# Patient Record
Sex: Female | Born: 1935
Health system: Southern US, Community
[De-identification: ages and names within clinical notes are randomized; demographics above are authoritative.]

## PROBLEM LIST (undated history)

## (undated) DIAGNOSIS — K219 Gastro-esophageal reflux disease without esophagitis: Secondary | ICD-10-CM

## (undated) DIAGNOSIS — I10 Essential (primary) hypertension: Secondary | ICD-10-CM

## (undated) DIAGNOSIS — R1310 Dysphagia, unspecified: Secondary | ICD-10-CM

## (undated) DIAGNOSIS — E785 Hyperlipidemia, unspecified: Secondary | ICD-10-CM

## (undated) DIAGNOSIS — J449 Chronic obstructive pulmonary disease, unspecified: Secondary | ICD-10-CM

## (undated) DIAGNOSIS — E119 Type 2 diabetes mellitus without complications: Secondary | ICD-10-CM

## (undated) HISTORY — DX: Hyperlipidemia, unspecified: E78.5

## (undated) HISTORY — DX: Type 2 diabetes mellitus without complications: E11.9

## (undated) HISTORY — DX: Essential (primary) hypertension: I10

## (undated) HISTORY — PX: HIP SURGERY: SHX245

## (undated) HISTORY — PX: ABDOMINAL HYSTERECTOMY: SHX81

## (undated) HISTORY — PX: CHOLECYSTECTOMY: SHX55

---

## 2015-06-28 DIAGNOSIS — E1140 Type 2 diabetes mellitus with diabetic neuropathy, unspecified: Secondary | ICD-10-CM | POA: Diagnosis not present

## 2015-06-28 DIAGNOSIS — R2689 Other abnormalities of gait and mobility: Secondary | ICD-10-CM | POA: Diagnosis not present

## 2015-06-28 DIAGNOSIS — L84 Corns and callosities: Secondary | ICD-10-CM | POA: Diagnosis not present

## 2015-06-28 DIAGNOSIS — E0842 Diabetes mellitus due to underlying condition with diabetic polyneuropathy: Secondary | ICD-10-CM | POA: Diagnosis not present

## 2015-06-28 DIAGNOSIS — M79671 Pain in right foot: Secondary | ICD-10-CM | POA: Diagnosis not present

## 2015-06-28 DIAGNOSIS — B351 Tinea unguium: Secondary | ICD-10-CM | POA: Diagnosis not present

## 2015-06-28 DIAGNOSIS — M76821 Posterior tibial tendinitis, right leg: Secondary | ICD-10-CM | POA: Diagnosis not present

## 2015-08-29 DIAGNOSIS — E119 Type 2 diabetes mellitus without complications: Secondary | ICD-10-CM | POA: Diagnosis not present

## 2015-08-29 DIAGNOSIS — E785 Hyperlipidemia, unspecified: Secondary | ICD-10-CM | POA: Diagnosis not present

## 2015-08-29 DIAGNOSIS — I10 Essential (primary) hypertension: Secondary | ICD-10-CM | POA: Diagnosis not present

## 2015-09-05 DIAGNOSIS — M810 Age-related osteoporosis without current pathological fracture: Secondary | ICD-10-CM | POA: Diagnosis not present

## 2015-09-05 DIAGNOSIS — E559 Vitamin D deficiency, unspecified: Secondary | ICD-10-CM | POA: Diagnosis not present

## 2015-09-05 DIAGNOSIS — E119 Type 2 diabetes mellitus without complications: Secondary | ICD-10-CM | POA: Diagnosis not present

## 2015-09-06 DIAGNOSIS — E119 Type 2 diabetes mellitus without complications: Secondary | ICD-10-CM | POA: Diagnosis not present

## 2015-09-06 DIAGNOSIS — I10 Essential (primary) hypertension: Secondary | ICD-10-CM | POA: Diagnosis not present

## 2015-09-06 DIAGNOSIS — E7800 Pure hypercholesterolemia, unspecified: Secondary | ICD-10-CM | POA: Diagnosis not present

## 2015-09-06 DIAGNOSIS — Z23 Encounter for immunization: Secondary | ICD-10-CM | POA: Diagnosis not present

## 2015-09-06 DIAGNOSIS — M810 Age-related osteoporosis without current pathological fracture: Secondary | ICD-10-CM | POA: Diagnosis not present

## 2015-09-13 DIAGNOSIS — E0842 Diabetes mellitus due to underlying condition with diabetic polyneuropathy: Secondary | ICD-10-CM | POA: Diagnosis not present

## 2015-09-13 DIAGNOSIS — B351 Tinea unguium: Secondary | ICD-10-CM | POA: Diagnosis not present

## 2015-09-13 DIAGNOSIS — E1140 Type 2 diabetes mellitus with diabetic neuropathy, unspecified: Secondary | ICD-10-CM | POA: Diagnosis not present

## 2015-09-13 DIAGNOSIS — L84 Corns and callosities: Secondary | ICD-10-CM | POA: Diagnosis not present

## 2015-11-15 DIAGNOSIS — B351 Tinea unguium: Secondary | ICD-10-CM | POA: Diagnosis not present

## 2015-11-15 DIAGNOSIS — E0842 Diabetes mellitus due to underlying condition with diabetic polyneuropathy: Secondary | ICD-10-CM | POA: Diagnosis not present

## 2015-11-15 DIAGNOSIS — L84 Corns and callosities: Secondary | ICD-10-CM | POA: Diagnosis not present

## 2015-11-15 DIAGNOSIS — E1140 Type 2 diabetes mellitus with diabetic neuropathy, unspecified: Secondary | ICD-10-CM | POA: Diagnosis not present

## 2015-11-23 DIAGNOSIS — E113293 Type 2 diabetes mellitus with mild nonproliferative diabetic retinopathy without macular edema, bilateral: Secondary | ICD-10-CM | POA: Diagnosis not present

## 2015-11-23 DIAGNOSIS — H2640 Unspecified secondary cataract: Secondary | ICD-10-CM | POA: Diagnosis not present

## 2015-11-23 DIAGNOSIS — H35372 Puckering of macula, left eye: Secondary | ICD-10-CM | POA: Diagnosis not present

## 2015-11-23 DIAGNOSIS — I119 Hypertensive heart disease without heart failure: Secondary | ICD-10-CM | POA: Diagnosis not present

## 2016-01-31 DIAGNOSIS — M2011 Hallux valgus (acquired), right foot: Secondary | ICD-10-CM | POA: Diagnosis not present

## 2016-01-31 DIAGNOSIS — M2012 Hallux valgus (acquired), left foot: Secondary | ICD-10-CM | POA: Diagnosis not present

## 2016-01-31 DIAGNOSIS — B351 Tinea unguium: Secondary | ICD-10-CM | POA: Diagnosis not present

## 2016-01-31 DIAGNOSIS — E0842 Diabetes mellitus due to underlying condition with diabetic polyneuropathy: Secondary | ICD-10-CM | POA: Diagnosis not present

## 2016-01-31 DIAGNOSIS — L84 Corns and callosities: Secondary | ICD-10-CM | POA: Diagnosis not present

## 2016-01-31 DIAGNOSIS — E1140 Type 2 diabetes mellitus with diabetic neuropathy, unspecified: Secondary | ICD-10-CM | POA: Diagnosis not present

## 2016-03-07 DIAGNOSIS — E119 Type 2 diabetes mellitus without complications: Secondary | ICD-10-CM | POA: Diagnosis not present

## 2016-03-07 DIAGNOSIS — S0011XA Contusion of right eyelid and periocular area, initial encounter: Secondary | ICD-10-CM | POA: Diagnosis not present

## 2016-03-07 DIAGNOSIS — S40021A Contusion of right upper arm, initial encounter: Secondary | ICD-10-CM | POA: Diagnosis not present

## 2016-03-07 DIAGNOSIS — Z9181 History of falling: Secondary | ICD-10-CM | POA: Diagnosis not present

## 2016-03-07 DIAGNOSIS — Y998 Other external cause status: Secondary | ICD-10-CM | POA: Diagnosis not present

## 2016-03-07 DIAGNOSIS — J45909 Unspecified asthma, uncomplicated: Secondary | ICD-10-CM | POA: Diagnosis present

## 2016-03-07 DIAGNOSIS — Z01818 Encounter for other preprocedural examination: Secondary | ICD-10-CM | POA: Diagnosis not present

## 2016-03-07 DIAGNOSIS — M6250 Muscle wasting and atrophy, not elsewhere classified, unspecified site: Secondary | ICD-10-CM | POA: Diagnosis not present

## 2016-03-07 DIAGNOSIS — W102XXA Fall (on)(from) incline, initial encounter: Secondary | ICD-10-CM | POA: Diagnosis not present

## 2016-03-07 DIAGNOSIS — S72001D Fracture of unspecified part of neck of right femur, subsequent encounter for closed fracture with routine healing: Secondary | ICD-10-CM | POA: Diagnosis not present

## 2016-03-07 DIAGNOSIS — S51811A Laceration without foreign body of right forearm, initial encounter: Secondary | ICD-10-CM | POA: Diagnosis not present

## 2016-03-07 DIAGNOSIS — E559 Vitamin D deficiency, unspecified: Secondary | ICD-10-CM | POA: Diagnosis not present

## 2016-03-07 DIAGNOSIS — R2689 Other abnormalities of gait and mobility: Secondary | ICD-10-CM | POA: Diagnosis not present

## 2016-03-07 DIAGNOSIS — W01198A Fall on same level from slipping, tripping and stumbling with subsequent striking against other object, initial encounter: Secondary | ICD-10-CM | POA: Diagnosis not present

## 2016-03-07 DIAGNOSIS — J4520 Mild intermittent asthma, uncomplicated: Secondary | ICD-10-CM | POA: Diagnosis not present

## 2016-03-07 DIAGNOSIS — Z96641 Presence of right artificial hip joint: Secondary | ICD-10-CM | POA: Diagnosis not present

## 2016-03-07 DIAGNOSIS — M25551 Pain in right hip: Secondary | ICD-10-CM | POA: Diagnosis not present

## 2016-03-07 DIAGNOSIS — Z7984 Long term (current) use of oral hypoglycemic drugs: Secondary | ICD-10-CM | POA: Diagnosis not present

## 2016-03-07 DIAGNOSIS — S72001A Fracture of unspecified part of neck of right femur, initial encounter for closed fracture: Secondary | ICD-10-CM | POA: Diagnosis not present

## 2016-03-07 DIAGNOSIS — M810 Age-related osteoporosis without current pathological fracture: Secondary | ICD-10-CM | POA: Diagnosis not present

## 2016-03-07 DIAGNOSIS — S72091A Other fracture of head and neck of right femur, initial encounter for closed fracture: Secondary | ICD-10-CM | POA: Diagnosis not present

## 2016-03-07 DIAGNOSIS — Y92531 Health care provider office as the place of occurrence of the external cause: Secondary | ICD-10-CM | POA: Diagnosis not present

## 2016-03-07 DIAGNOSIS — J449 Chronic obstructive pulmonary disease, unspecified: Secondary | ICD-10-CM | POA: Diagnosis not present

## 2016-03-07 DIAGNOSIS — Z1383 Encounter for screening for respiratory disorder NEC: Secondary | ICD-10-CM | POA: Diagnosis not present

## 2016-03-07 DIAGNOSIS — S72041A Displaced fracture of base of neck of right femur, initial encounter for closed fracture: Secondary | ICD-10-CM | POA: Diagnosis not present

## 2016-03-07 DIAGNOSIS — E785 Hyperlipidemia, unspecified: Secondary | ICD-10-CM | POA: Diagnosis present

## 2016-03-07 DIAGNOSIS — S72141A Displaced intertrochanteric fracture of right femur, initial encounter for closed fracture: Secondary | ICD-10-CM | POA: Diagnosis not present

## 2016-03-07 DIAGNOSIS — Z85038 Personal history of other malignant neoplasm of large intestine: Secondary | ICD-10-CM | POA: Diagnosis not present

## 2016-03-07 DIAGNOSIS — S0511XA Contusion of eyeball and orbital tissues, right eye, initial encounter: Secondary | ICD-10-CM | POA: Diagnosis not present

## 2016-03-07 DIAGNOSIS — S50311D Abrasion of right elbow, subsequent encounter: Secondary | ICD-10-CM | POA: Diagnosis not present

## 2016-03-07 DIAGNOSIS — Z87891 Personal history of nicotine dependence: Secondary | ICD-10-CM | POA: Diagnosis not present

## 2016-03-07 DIAGNOSIS — Y9389 Activity, other specified: Secondary | ICD-10-CM | POA: Diagnosis not present

## 2016-03-07 DIAGNOSIS — Z01810 Encounter for preprocedural cardiovascular examination: Secondary | ICD-10-CM | POA: Diagnosis not present

## 2016-03-07 DIAGNOSIS — Z5189 Encounter for other specified aftercare: Secondary | ICD-10-CM | POA: Diagnosis not present

## 2016-03-07 DIAGNOSIS — D62 Acute posthemorrhagic anemia: Secondary | ICD-10-CM | POA: Diagnosis not present

## 2016-03-07 DIAGNOSIS — S0081XA Abrasion of other part of head, initial encounter: Secondary | ICD-10-CM | POA: Diagnosis not present

## 2016-03-07 DIAGNOSIS — S50811D Abrasion of right forearm, subsequent encounter: Secondary | ICD-10-CM | POA: Diagnosis not present

## 2016-03-07 DIAGNOSIS — I10 Essential (primary) hypertension: Secondary | ICD-10-CM | POA: Diagnosis present

## 2016-03-10 DIAGNOSIS — B351 Tinea unguium: Secondary | ICD-10-CM | POA: Diagnosis not present

## 2016-03-10 DIAGNOSIS — S72001D Fracture of unspecified part of neck of right femur, subsequent encounter for closed fracture with routine healing: Secondary | ICD-10-CM | POA: Diagnosis not present

## 2016-03-10 DIAGNOSIS — R2689 Other abnormalities of gait and mobility: Secondary | ICD-10-CM | POA: Diagnosis not present

## 2016-03-10 DIAGNOSIS — M25551 Pain in right hip: Secondary | ICD-10-CM | POA: Diagnosis not present

## 2016-03-10 DIAGNOSIS — S71001A Unspecified open wound, right hip, initial encounter: Secondary | ICD-10-CM | POA: Diagnosis not present

## 2016-03-10 DIAGNOSIS — S72041A Displaced fracture of base of neck of right femur, initial encounter for closed fracture: Secondary | ICD-10-CM | POA: Diagnosis not present

## 2016-03-10 DIAGNOSIS — S40021A Contusion of right upper arm, initial encounter: Secondary | ICD-10-CM | POA: Diagnosis not present

## 2016-03-10 DIAGNOSIS — Z9181 History of falling: Secondary | ICD-10-CM | POA: Diagnosis not present

## 2016-03-10 DIAGNOSIS — D62 Acute posthemorrhagic anemia: Secondary | ICD-10-CM | POA: Diagnosis not present

## 2016-03-10 DIAGNOSIS — E785 Hyperlipidemia, unspecified: Secondary | ICD-10-CM | POA: Diagnosis not present

## 2016-03-10 DIAGNOSIS — S72091A Other fracture of head and neck of right femur, initial encounter for closed fracture: Secondary | ICD-10-CM | POA: Diagnosis not present

## 2016-03-10 DIAGNOSIS — M6250 Muscle wasting and atrophy, not elsewhere classified, unspecified site: Secondary | ICD-10-CM | POA: Diagnosis not present

## 2016-03-10 DIAGNOSIS — S72001A Fracture of unspecified part of neck of right femur, initial encounter for closed fracture: Secondary | ICD-10-CM | POA: Diagnosis not present

## 2016-03-10 DIAGNOSIS — S0511XA Contusion of eyeball and orbital tissues, right eye, initial encounter: Secondary | ICD-10-CM | POA: Diagnosis not present

## 2016-03-10 DIAGNOSIS — J45909 Unspecified asthma, uncomplicated: Secondary | ICD-10-CM | POA: Diagnosis not present

## 2016-03-10 DIAGNOSIS — S50311D Abrasion of right elbow, subsequent encounter: Secondary | ICD-10-CM | POA: Diagnosis not present

## 2016-03-10 DIAGNOSIS — I70223 Atherosclerosis of native arteries of extremities with rest pain, bilateral legs: Secondary | ICD-10-CM | POA: Diagnosis not present

## 2016-03-10 DIAGNOSIS — R110 Nausea: Secondary | ICD-10-CM | POA: Diagnosis not present

## 2016-03-10 DIAGNOSIS — M10071 Idiopathic gout, right ankle and foot: Secondary | ICD-10-CM | POA: Diagnosis not present

## 2016-03-10 DIAGNOSIS — S0011XA Contusion of right eyelid and periocular area, initial encounter: Secondary | ICD-10-CM | POA: Diagnosis not present

## 2016-03-10 DIAGNOSIS — Z96641 Presence of right artificial hip joint: Secondary | ICD-10-CM | POA: Diagnosis not present

## 2016-03-10 DIAGNOSIS — R262 Difficulty in walking, not elsewhere classified: Secondary | ICD-10-CM | POA: Diagnosis not present

## 2016-03-10 DIAGNOSIS — E119 Type 2 diabetes mellitus without complications: Secondary | ICD-10-CM | POA: Diagnosis not present

## 2016-03-10 DIAGNOSIS — J4520 Mild intermittent asthma, uncomplicated: Secondary | ICD-10-CM | POA: Diagnosis not present

## 2016-03-10 DIAGNOSIS — I10 Essential (primary) hypertension: Secondary | ICD-10-CM | POA: Diagnosis not present

## 2016-03-10 DIAGNOSIS — S50811D Abrasion of right forearm, subsequent encounter: Secondary | ICD-10-CM | POA: Diagnosis not present

## 2016-03-10 DIAGNOSIS — Z5189 Encounter for other specified aftercare: Secondary | ICD-10-CM | POA: Diagnosis not present

## 2016-03-12 DIAGNOSIS — M6250 Muscle wasting and atrophy, not elsewhere classified, unspecified site: Secondary | ICD-10-CM | POA: Diagnosis not present

## 2016-03-12 DIAGNOSIS — J4520 Mild intermittent asthma, uncomplicated: Secondary | ICD-10-CM | POA: Diagnosis not present

## 2016-03-12 DIAGNOSIS — S50811D Abrasion of right forearm, subsequent encounter: Secondary | ICD-10-CM | POA: Diagnosis not present

## 2016-03-12 DIAGNOSIS — R2689 Other abnormalities of gait and mobility: Secondary | ICD-10-CM | POA: Diagnosis not present

## 2016-03-12 DIAGNOSIS — M10071 Idiopathic gout, right ankle and foot: Secondary | ICD-10-CM | POA: Diagnosis not present

## 2016-03-14 DIAGNOSIS — M25551 Pain in right hip: Secondary | ICD-10-CM | POA: Diagnosis not present

## 2016-03-14 DIAGNOSIS — S71001A Unspecified open wound, right hip, initial encounter: Secondary | ICD-10-CM | POA: Diagnosis not present

## 2016-03-14 DIAGNOSIS — R262 Difficulty in walking, not elsewhere classified: Secondary | ICD-10-CM | POA: Diagnosis not present

## 2016-03-15 DIAGNOSIS — I70223 Atherosclerosis of native arteries of extremities with rest pain, bilateral legs: Secondary | ICD-10-CM | POA: Diagnosis not present

## 2016-03-15 DIAGNOSIS — B351 Tinea unguium: Secondary | ICD-10-CM | POA: Diagnosis not present

## 2016-03-19 DIAGNOSIS — M10071 Idiopathic gout, right ankle and foot: Secondary | ICD-10-CM | POA: Diagnosis not present

## 2016-03-19 DIAGNOSIS — J4520 Mild intermittent asthma, uncomplicated: Secondary | ICD-10-CM | POA: Diagnosis not present

## 2016-03-19 DIAGNOSIS — R2689 Other abnormalities of gait and mobility: Secondary | ICD-10-CM | POA: Diagnosis not present

## 2016-03-19 DIAGNOSIS — M6250 Muscle wasting and atrophy, not elsewhere classified, unspecified site: Secondary | ICD-10-CM | POA: Diagnosis not present

## 2016-03-19 DIAGNOSIS — S50811D Abrasion of right forearm, subsequent encounter: Secondary | ICD-10-CM | POA: Diagnosis not present

## 2016-03-26 DIAGNOSIS — R2689 Other abnormalities of gait and mobility: Secondary | ICD-10-CM | POA: Diagnosis not present

## 2016-03-26 DIAGNOSIS — R110 Nausea: Secondary | ICD-10-CM | POA: Diagnosis not present

## 2016-03-26 DIAGNOSIS — M6250 Muscle wasting and atrophy, not elsewhere classified, unspecified site: Secondary | ICD-10-CM | POA: Diagnosis not present

## 2016-04-10 DIAGNOSIS — M6250 Muscle wasting and atrophy, not elsewhere classified, unspecified site: Secondary | ICD-10-CM | POA: Diagnosis not present

## 2016-04-10 DIAGNOSIS — R2689 Other abnormalities of gait and mobility: Secondary | ICD-10-CM | POA: Diagnosis not present

## 2016-04-10 DIAGNOSIS — R110 Nausea: Secondary | ICD-10-CM | POA: Diagnosis not present

## 2016-04-11 DIAGNOSIS — R2689 Other abnormalities of gait and mobility: Secondary | ICD-10-CM | POA: Diagnosis not present

## 2016-04-11 DIAGNOSIS — E119 Type 2 diabetes mellitus without complications: Secondary | ICD-10-CM | POA: Diagnosis not present

## 2016-04-11 DIAGNOSIS — Z96641 Presence of right artificial hip joint: Secondary | ICD-10-CM | POA: Diagnosis not present

## 2016-04-11 DIAGNOSIS — I10 Essential (primary) hypertension: Secondary | ICD-10-CM | POA: Diagnosis not present

## 2016-04-11 DIAGNOSIS — J449 Chronic obstructive pulmonary disease, unspecified: Secondary | ICD-10-CM | POA: Diagnosis not present

## 2016-04-11 DIAGNOSIS — S72001D Fracture of unspecified part of neck of right femur, subsequent encounter for closed fracture with routine healing: Secondary | ICD-10-CM | POA: Diagnosis not present

## 2016-04-17 DIAGNOSIS — R2689 Other abnormalities of gait and mobility: Secondary | ICD-10-CM | POA: Diagnosis not present

## 2016-04-17 DIAGNOSIS — Z96641 Presence of right artificial hip joint: Secondary | ICD-10-CM | POA: Diagnosis not present

## 2016-04-17 DIAGNOSIS — J449 Chronic obstructive pulmonary disease, unspecified: Secondary | ICD-10-CM | POA: Diagnosis not present

## 2016-04-17 DIAGNOSIS — S72001D Fracture of unspecified part of neck of right femur, subsequent encounter for closed fracture with routine healing: Secondary | ICD-10-CM | POA: Diagnosis not present

## 2016-04-17 DIAGNOSIS — I10 Essential (primary) hypertension: Secondary | ICD-10-CM | POA: Diagnosis not present

## 2016-04-17 DIAGNOSIS — E119 Type 2 diabetes mellitus without complications: Secondary | ICD-10-CM | POA: Diagnosis not present

## 2016-04-18 DIAGNOSIS — S72041A Displaced fracture of base of neck of right femur, initial encounter for closed fracture: Secondary | ICD-10-CM | POA: Diagnosis not present

## 2016-04-19 DIAGNOSIS — I10 Essential (primary) hypertension: Secondary | ICD-10-CM | POA: Diagnosis not present

## 2016-04-19 DIAGNOSIS — E119 Type 2 diabetes mellitus without complications: Secondary | ICD-10-CM | POA: Diagnosis not present

## 2016-04-19 DIAGNOSIS — S72001D Fracture of unspecified part of neck of right femur, subsequent encounter for closed fracture with routine healing: Secondary | ICD-10-CM | POA: Diagnosis not present

## 2016-04-19 DIAGNOSIS — J449 Chronic obstructive pulmonary disease, unspecified: Secondary | ICD-10-CM | POA: Diagnosis not present

## 2016-04-19 DIAGNOSIS — R2689 Other abnormalities of gait and mobility: Secondary | ICD-10-CM | POA: Diagnosis not present

## 2016-04-19 DIAGNOSIS — Z96641 Presence of right artificial hip joint: Secondary | ICD-10-CM | POA: Diagnosis not present

## 2016-04-24 DIAGNOSIS — J449 Chronic obstructive pulmonary disease, unspecified: Secondary | ICD-10-CM | POA: Diagnosis not present

## 2016-04-24 DIAGNOSIS — Z0000 Encounter for general adult medical examination without abnormal findings: Secondary | ICD-10-CM | POA: Diagnosis not present

## 2016-04-24 DIAGNOSIS — S72001D Fracture of unspecified part of neck of right femur, subsequent encounter for closed fracture with routine healing: Secondary | ICD-10-CM | POA: Diagnosis not present

## 2016-04-24 DIAGNOSIS — R2689 Other abnormalities of gait and mobility: Secondary | ICD-10-CM | POA: Diagnosis not present

## 2016-04-24 DIAGNOSIS — E119 Type 2 diabetes mellitus without complications: Secondary | ICD-10-CM | POA: Diagnosis not present

## 2016-04-24 DIAGNOSIS — I10 Essential (primary) hypertension: Secondary | ICD-10-CM | POA: Diagnosis not present

## 2016-04-24 DIAGNOSIS — Z23 Encounter for immunization: Secondary | ICD-10-CM | POA: Diagnosis not present

## 2016-04-24 DIAGNOSIS — Z96641 Presence of right artificial hip joint: Secondary | ICD-10-CM | POA: Diagnosis not present

## 2016-04-25 DIAGNOSIS — I10 Essential (primary) hypertension: Secondary | ICD-10-CM | POA: Diagnosis not present

## 2016-04-25 DIAGNOSIS — E119 Type 2 diabetes mellitus without complications: Secondary | ICD-10-CM | POA: Diagnosis not present

## 2016-04-25 DIAGNOSIS — R9431 Abnormal electrocardiogram [ECG] [EKG]: Secondary | ICD-10-CM | POA: Diagnosis not present

## 2016-04-27 DIAGNOSIS — E119 Type 2 diabetes mellitus without complications: Secondary | ICD-10-CM | POA: Diagnosis not present

## 2016-04-27 DIAGNOSIS — Z96641 Presence of right artificial hip joint: Secondary | ICD-10-CM | POA: Diagnosis not present

## 2016-04-27 DIAGNOSIS — I10 Essential (primary) hypertension: Secondary | ICD-10-CM | POA: Diagnosis not present

## 2016-04-27 DIAGNOSIS — J449 Chronic obstructive pulmonary disease, unspecified: Secondary | ICD-10-CM | POA: Diagnosis not present

## 2016-04-27 DIAGNOSIS — S72001D Fracture of unspecified part of neck of right femur, subsequent encounter for closed fracture with routine healing: Secondary | ICD-10-CM | POA: Diagnosis not present

## 2016-04-27 DIAGNOSIS — R2689 Other abnormalities of gait and mobility: Secondary | ICD-10-CM | POA: Diagnosis not present

## 2016-04-30 DIAGNOSIS — J449 Chronic obstructive pulmonary disease, unspecified: Secondary | ICD-10-CM | POA: Diagnosis not present

## 2016-04-30 DIAGNOSIS — R2689 Other abnormalities of gait and mobility: Secondary | ICD-10-CM | POA: Diagnosis not present

## 2016-04-30 DIAGNOSIS — Z96641 Presence of right artificial hip joint: Secondary | ICD-10-CM | POA: Diagnosis not present

## 2016-04-30 DIAGNOSIS — S72001D Fracture of unspecified part of neck of right femur, subsequent encounter for closed fracture with routine healing: Secondary | ICD-10-CM | POA: Diagnosis not present

## 2016-04-30 DIAGNOSIS — E119 Type 2 diabetes mellitus without complications: Secondary | ICD-10-CM | POA: Diagnosis not present

## 2016-04-30 DIAGNOSIS — I10 Essential (primary) hypertension: Secondary | ICD-10-CM | POA: Diagnosis not present

## 2016-05-02 DIAGNOSIS — E119 Type 2 diabetes mellitus without complications: Secondary | ICD-10-CM | POA: Diagnosis not present

## 2016-05-02 DIAGNOSIS — J449 Chronic obstructive pulmonary disease, unspecified: Secondary | ICD-10-CM | POA: Diagnosis not present

## 2016-05-02 DIAGNOSIS — Z96641 Presence of right artificial hip joint: Secondary | ICD-10-CM | POA: Diagnosis not present

## 2016-05-02 DIAGNOSIS — R2689 Other abnormalities of gait and mobility: Secondary | ICD-10-CM | POA: Diagnosis not present

## 2016-05-02 DIAGNOSIS — I10 Essential (primary) hypertension: Secondary | ICD-10-CM | POA: Diagnosis not present

## 2016-05-02 DIAGNOSIS — S72001D Fracture of unspecified part of neck of right femur, subsequent encounter for closed fracture with routine healing: Secondary | ICD-10-CM | POA: Diagnosis not present

## 2016-05-04 DIAGNOSIS — E119 Type 2 diabetes mellitus without complications: Secondary | ICD-10-CM | POA: Diagnosis not present

## 2016-05-04 DIAGNOSIS — J449 Chronic obstructive pulmonary disease, unspecified: Secondary | ICD-10-CM | POA: Diagnosis not present

## 2016-05-04 DIAGNOSIS — Z96641 Presence of right artificial hip joint: Secondary | ICD-10-CM | POA: Diagnosis not present

## 2016-05-04 DIAGNOSIS — S72001D Fracture of unspecified part of neck of right femur, subsequent encounter for closed fracture with routine healing: Secondary | ICD-10-CM | POA: Diagnosis not present

## 2016-05-04 DIAGNOSIS — R2689 Other abnormalities of gait and mobility: Secondary | ICD-10-CM | POA: Diagnosis not present

## 2016-05-04 DIAGNOSIS — I10 Essential (primary) hypertension: Secondary | ICD-10-CM | POA: Diagnosis not present

## 2016-05-08 DIAGNOSIS — Z96641 Presence of right artificial hip joint: Secondary | ICD-10-CM | POA: Diagnosis not present

## 2016-05-08 DIAGNOSIS — E0842 Diabetes mellitus due to underlying condition with diabetic polyneuropathy: Secondary | ICD-10-CM | POA: Diagnosis not present

## 2016-05-08 DIAGNOSIS — E1140 Type 2 diabetes mellitus with diabetic neuropathy, unspecified: Secondary | ICD-10-CM | POA: Diagnosis not present

## 2016-05-08 DIAGNOSIS — J449 Chronic obstructive pulmonary disease, unspecified: Secondary | ICD-10-CM | POA: Diagnosis not present

## 2016-05-08 DIAGNOSIS — L84 Corns and callosities: Secondary | ICD-10-CM | POA: Diagnosis not present

## 2016-05-08 DIAGNOSIS — I10 Essential (primary) hypertension: Secondary | ICD-10-CM | POA: Diagnosis not present

## 2016-05-08 DIAGNOSIS — B351 Tinea unguium: Secondary | ICD-10-CM | POA: Diagnosis not present

## 2016-05-08 DIAGNOSIS — R2689 Other abnormalities of gait and mobility: Secondary | ICD-10-CM | POA: Diagnosis not present

## 2016-05-08 DIAGNOSIS — E119 Type 2 diabetes mellitus without complications: Secondary | ICD-10-CM | POA: Diagnosis not present

## 2016-05-08 DIAGNOSIS — S72001D Fracture of unspecified part of neck of right femur, subsequent encounter for closed fracture with routine healing: Secondary | ICD-10-CM | POA: Diagnosis not present

## 2016-05-10 DIAGNOSIS — J449 Chronic obstructive pulmonary disease, unspecified: Secondary | ICD-10-CM | POA: Diagnosis not present

## 2016-05-10 DIAGNOSIS — Z96641 Presence of right artificial hip joint: Secondary | ICD-10-CM | POA: Diagnosis not present

## 2016-05-10 DIAGNOSIS — E119 Type 2 diabetes mellitus without complications: Secondary | ICD-10-CM | POA: Diagnosis not present

## 2016-05-10 DIAGNOSIS — I10 Essential (primary) hypertension: Secondary | ICD-10-CM | POA: Diagnosis not present

## 2016-05-10 DIAGNOSIS — R2689 Other abnormalities of gait and mobility: Secondary | ICD-10-CM | POA: Diagnosis not present

## 2016-05-10 DIAGNOSIS — S72001D Fracture of unspecified part of neck of right femur, subsequent encounter for closed fracture with routine healing: Secondary | ICD-10-CM | POA: Diagnosis not present

## 2016-07-24 DIAGNOSIS — E119 Type 2 diabetes mellitus without complications: Secondary | ICD-10-CM | POA: Diagnosis not present

## 2016-07-24 DIAGNOSIS — B351 Tinea unguium: Secondary | ICD-10-CM | POA: Diagnosis not present

## 2016-07-24 DIAGNOSIS — L84 Corns and callosities: Secondary | ICD-10-CM | POA: Diagnosis not present

## 2016-08-15 DIAGNOSIS — E113293 Type 2 diabetes mellitus with mild nonproliferative diabetic retinopathy without macular edema, bilateral: Secondary | ICD-10-CM | POA: Diagnosis not present

## 2016-08-15 DIAGNOSIS — H2640 Unspecified secondary cataract: Secondary | ICD-10-CM | POA: Diagnosis not present

## 2016-08-15 DIAGNOSIS — I119 Hypertensive heart disease without heart failure: Secondary | ICD-10-CM | POA: Diagnosis not present

## 2016-08-16 DIAGNOSIS — M810 Age-related osteoporosis without current pathological fracture: Secondary | ICD-10-CM | POA: Diagnosis not present

## 2016-08-16 DIAGNOSIS — E119 Type 2 diabetes mellitus without complications: Secondary | ICD-10-CM | POA: Diagnosis not present

## 2016-08-16 DIAGNOSIS — E213 Hyperparathyroidism, unspecified: Secondary | ICD-10-CM | POA: Diagnosis not present

## 2016-08-16 DIAGNOSIS — E559 Vitamin D deficiency, unspecified: Secondary | ICD-10-CM | POA: Diagnosis not present

## 2016-10-22 ENCOUNTER — Ambulatory Visit (INDEPENDENT_AMBULATORY_CARE_PROVIDER_SITE_OTHER): Payer: MEDICARE | Admitting: Podiatry

## 2016-10-22 ENCOUNTER — Encounter: Payer: Self-pay | Admitting: Podiatry

## 2016-10-22 VITALS — BP 142/62

## 2016-10-22 DIAGNOSIS — M79676 Pain in unspecified toe(s): Secondary | ICD-10-CM | POA: Diagnosis not present

## 2016-10-22 DIAGNOSIS — B351 Tinea unguium: Secondary | ICD-10-CM

## 2016-10-22 NOTE — Progress Notes (Signed)
   SUBJECTIVE Patient  presents to office today complaining of elongated, thickened nails. Pain while ambulating in shoes. Patient is unable to trim their own nails.   OBJECTIVE General Patient is awake, alert, and oriented x 3 and in no acute distress. Derm Skin is dry and supple bilateral. Negative open lesions or macerations. Remaining integument unremarkable. Nails are tender, long, thickened and dystrophic with subungual debris, consistent with onychomycosis, 1-5 bilateral. No signs of infection noted. Vasc  DP and PT pedal pulses palpable bilaterally. Temperature gradient within normal limits.  Neuro Epicritic and protective threshold sensation diminished bilaterally.  Musculoskeletal Exam medial longitudinal arch collapse of bilateral feet during weightbearing. No symptomatic pedal deformities noted bilateral. Muscular strength within normal limits.  ASSESSMENT 1. Onychodystrophic nails 1-5 bilateral with hyperkeratosis of nails.  2. Onychomycosis of nail due to dermatophyte bilateral 3. Pain in foot bilateral 4. PTTD bilateral  PLAN OF CARE 1. Patient evaluated today.  2. Instructed to maintain good pedal hygiene and foot care.  3. Mechanical debridement of nails 1-5 bilaterally performed using a nail nipper. Filed with dremel without incident.  4. Appt with Liliane Channel, Pedorthist.  5. Return to clinic in 3 months.    Edrick Kins, DPM [facility]  Dr. Edrick Kins, [address]                                        [place], [place] [address]                Office [phone]  Fax [phone]

## 2016-10-29 ENCOUNTER — Ambulatory Visit (INDEPENDENT_AMBULATORY_CARE_PROVIDER_SITE_OTHER): Payer: MEDICARE | Admitting: Orthotics

## 2016-10-29 DIAGNOSIS — M79676 Pain in unspecified toe(s): Secondary | ICD-10-CM

## 2016-10-29 DIAGNOSIS — B351 Tinea unguium: Secondary | ICD-10-CM

## 2016-10-29 NOTE — Progress Notes (Signed)
Patient came in today to be cast for new [place] brace.  She has had articulating az in past, but felt it has become too bulky/wt for her at this time...recast her for [place] sporty which is lighter than traditional AZ....patient pleasant and tolerated procedure well.

## 2016-11-23 DIAGNOSIS — I10 Essential (primary) hypertension: Secondary | ICD-10-CM | POA: Diagnosis not present

## 2016-11-23 DIAGNOSIS — M810 Age-related osteoporosis without current pathological fracture: Secondary | ICD-10-CM | POA: Diagnosis not present

## 2016-11-23 DIAGNOSIS — E7800 Pure hypercholesterolemia, unspecified: Secondary | ICD-10-CM | POA: Diagnosis not present

## 2016-11-23 DIAGNOSIS — Z79899 Other long term (current) drug therapy: Secondary | ICD-10-CM | POA: Diagnosis not present

## 2016-11-23 DIAGNOSIS — E119 Type 2 diabetes mellitus without complications: Secondary | ICD-10-CM | POA: Diagnosis not present

## 2016-12-03 ENCOUNTER — Ambulatory Visit: Payer: MEDICARE | Admitting: Orthotics

## 2016-12-07 ENCOUNTER — Ambulatory Visit (INDEPENDENT_AMBULATORY_CARE_PROVIDER_SITE_OTHER): Payer: MEDICARE | Admitting: Orthotics

## 2016-12-07 DIAGNOSIS — M76821 Posterior tibial tendinitis, right leg: Secondary | ICD-10-CM | POA: Diagnosis not present

## 2016-12-07 DIAGNOSIS — M76822 Posterior tibial tendinitis, left leg: Secondary | ICD-10-CM | POA: Diagnosis not present

## 2016-12-07 DIAGNOSIS — B351 Tinea unguium: Secondary | ICD-10-CM

## 2016-12-07 DIAGNOSIS — M79676 Pain in unspecified toe(s): Secondary | ICD-10-CM | POA: Diagnosis not present

## 2016-12-13 NOTE — Progress Notes (Signed)
Patient  came in to pick up [place] brace w/ dorsi assist springs.  The brace fit well and immediately patient's  gait approved.  The brace provided desired m-l stability in frontal/transverse planes and aided in dorsiflexion in saggital plane. Patient was able to don and doff brace with minimal difficulty.  Overall patient pleased with fit and functionality of brace.  

## 2016-12-18 DIAGNOSIS — M810 Age-related osteoporosis without current pathological fracture: Secondary | ICD-10-CM | POA: Diagnosis not present

## 2017-01-14 ENCOUNTER — Encounter: Payer: Self-pay | Admitting: Podiatry

## 2017-01-14 ENCOUNTER — Ambulatory Visit (INDEPENDENT_AMBULATORY_CARE_PROVIDER_SITE_OTHER): Payer: MEDICARE | Admitting: Podiatry

## 2017-01-14 DIAGNOSIS — E0842 Diabetes mellitus due to underlying condition with diabetic polyneuropathy: Secondary | ICD-10-CM

## 2017-01-14 DIAGNOSIS — B351 Tinea unguium: Secondary | ICD-10-CM | POA: Diagnosis not present

## 2017-01-14 DIAGNOSIS — M79676 Pain in unspecified toe(s): Secondary | ICD-10-CM | POA: Diagnosis not present

## 2017-01-14 NOTE — Progress Notes (Signed)
   SUBJECTIVE Patient with a history of diabetes mellitus presents to office today complaining of elongated, thickened nails. Pain while ambulating in shoes. Patient is unable to trim their own nails.   OBJECTIVE General Patient is awake, alert, and oriented x 3 and in no acute distress. Derm Skin is dry and supple bilateral. Negative open lesions or macerations. Remaining integument unremarkable. Nails are tender, long, thickened and dystrophic with subungual debris, consistent with onychomycosis, 1-5 bilateral. No signs of infection noted. Vasc  DP and PT pedal pulses palpable bilaterally. Temperature gradient within normal limits.  Neuro Epicritic and protective threshold sensation diminished bilaterally.  Musculoskeletal Exam No symptomatic pedal deformities noted bilateral. Muscular strength within normal limits.  ASSESSMENT 1. Diabetes Mellitus w/ peripheral neuropathy 2. Onychomycosis of nail due to dermatophyte bilateral 3. Pain in foot bilateral  PLAN OF CARE 1. Patient evaluated today. 2. Instructed to maintain good pedal hygiene and foot care. Stressed importance of controlling blood sugar.  3. Mechanical debridement of nails 1-5 bilaterally performed using a nail nipper. Filed with dremel without incident.  4. Return to clinic in 3 mos.   Patient is currently remodeling her daughter's house and doing a bedroom addition. Ask if it is completed.   Edrick Kins, DPM [facility]  Dr. Edrick Kins, [address]                                        [place], [place] [address]                Office [phone]  Fax [phone]

## 2017-03-12 DIAGNOSIS — H35362 Drusen (degenerative) of macula, left eye: Secondary | ICD-10-CM | POA: Diagnosis not present

## 2017-03-12 DIAGNOSIS — E119 Type 2 diabetes mellitus without complications: Secondary | ICD-10-CM | POA: Diagnosis not present

## 2017-03-12 DIAGNOSIS — D485 Neoplasm of uncertain behavior of skin: Secondary | ICD-10-CM | POA: Diagnosis not present

## 2017-03-12 DIAGNOSIS — H02831 Dermatochalasis of right upper eyelid: Secondary | ICD-10-CM | POA: Diagnosis not present

## 2017-03-25 DIAGNOSIS — Z79899 Other long term (current) drug therapy: Secondary | ICD-10-CM | POA: Diagnosis not present

## 2017-03-25 DIAGNOSIS — Z23 Encounter for immunization: Secondary | ICD-10-CM | POA: Diagnosis not present

## 2017-03-25 DIAGNOSIS — E1165 Type 2 diabetes mellitus with hyperglycemia: Secondary | ICD-10-CM | POA: Diagnosis not present

## 2017-03-25 DIAGNOSIS — I10 Essential (primary) hypertension: Secondary | ICD-10-CM | POA: Diagnosis not present

## 2017-03-25 DIAGNOSIS — I340 Nonrheumatic mitral (valve) insufficiency: Secondary | ICD-10-CM | POA: Diagnosis not present

## 2017-03-25 DIAGNOSIS — E7800 Pure hypercholesterolemia, unspecified: Secondary | ICD-10-CM | POA: Diagnosis not present

## 2017-03-25 DIAGNOSIS — J449 Chronic obstructive pulmonary disease, unspecified: Secondary | ICD-10-CM | POA: Diagnosis not present

## 2017-04-17 ENCOUNTER — Ambulatory Visit (INDEPENDENT_AMBULATORY_CARE_PROVIDER_SITE_OTHER): Payer: MEDICARE | Admitting: Podiatry

## 2017-04-17 DIAGNOSIS — L989 Disorder of the skin and subcutaneous tissue, unspecified: Secondary | ICD-10-CM

## 2017-04-17 DIAGNOSIS — M79676 Pain in unspecified toe(s): Secondary | ICD-10-CM | POA: Diagnosis not present

## 2017-04-17 DIAGNOSIS — E0842 Diabetes mellitus due to underlying condition with diabetic polyneuropathy: Secondary | ICD-10-CM | POA: Diagnosis not present

## 2017-04-17 DIAGNOSIS — B351 Tinea unguium: Secondary | ICD-10-CM | POA: Diagnosis not present

## 2017-04-21 NOTE — Progress Notes (Signed)
    Subjective: Patient is a 83 y.o. female presenting to the office today with a chief complaint of painful callus lesions to the right foot that have been present for the past few weeks.  Patient also complains of elongated, thickened nails that cause pain while ambulating in shoes. Patient is unable to trim their own nails. Patient presents today for further treatment and evaluation.   Past Medical History:  Diagnosis Date  . Diabetes mellitus without complication ([place])   . Hyperlipidemia   . Hypertension     Objective:  Physical Exam General: Alert and oriented x3 in no acute distress  Dermatology: Hyperkeratotic lesion present on the right foot x 2. Pain on palpation with a central nucleated core noted. Skin is warm, dry and supple bilateral lower extremities. Negative for open lesions or macerations. Nails are tender, long, thickened and dystrophic with subungual debris, consistent with onychomycosis, 1-5 bilateral. No signs of infection noted.  Vascular: Palpable pedal pulses bilaterally. No edema or erythema noted. Capillary refill within normal limits.  Neurological: Epicritic and protective threshold grossly intact bilaterally.   Musculoskeletal Exam: Pain on palpation at the keratotic lesion noted. Range of motion within normal limits bilateral. Muscle strength 5/5 in all groups bilateral.  Assessment: 1. Onychodystrophic nails 1-5 bilateral with hyperkeratosis of nails.  2. Onychomycosis of nail due to dermatophyte bilateral 3. Pre-ulcerative calluses to the right foot x 2   Plan of Care:  #1 Patient evaluated. #2 Excisional debridement of keratoic lesion using a chisel blade was performed without incident.  #3 Dressed with light dressing. #4 Mechanical debridement of nails 1-5 bilaterally performed using a nail nipper. Filed with dremel without incident.  #5 Patient is to return to the clinic in 3 months.   Edrick Kins, DPM [facility]  Dr.  Edrick Kins, [address]                                        [place], [place] [address]                Office [phone]  Fax [phone]

## 2017-05-20 DIAGNOSIS — H903 Sensorineural hearing loss, bilateral: Secondary | ICD-10-CM | POA: Diagnosis not present

## 2017-05-20 DIAGNOSIS — H6121 Impacted cerumen, right ear: Secondary | ICD-10-CM | POA: Diagnosis not present

## 2017-06-21 DIAGNOSIS — M810 Age-related osteoporosis without current pathological fracture: Secondary | ICD-10-CM | POA: Diagnosis not present

## 2017-07-17 ENCOUNTER — Ambulatory Visit (INDEPENDENT_AMBULATORY_CARE_PROVIDER_SITE_OTHER): Payer: MEDICARE | Admitting: Podiatry

## 2017-07-17 DIAGNOSIS — M79676 Pain in unspecified toe(s): Secondary | ICD-10-CM | POA: Diagnosis not present

## 2017-07-17 DIAGNOSIS — B351 Tinea unguium: Secondary | ICD-10-CM | POA: Diagnosis not present

## 2017-07-21 NOTE — Progress Notes (Signed)
   SUBJECTIVE Patient presents to office today complaining of elongated, thickened nails. Pain while ambulating in shoes. Patient is unable to trim their own nails.   Past Medical History:  Diagnosis Date  . Diabetes mellitus without complication ([place])   . Hyperlipidemia   . Hypertension     OBJECTIVE General Patient is awake, alert, and oriented x 3 and in no acute distress. Derm Skin is dry and supple bilateral. Negative open lesions or macerations. Remaining integument unremarkable. Nails are tender, long, thickened and dystrophic with subungual debris, consistent with onychomycosis, 1-5 bilateral. No signs of infection noted. Vasc  DP and PT pedal pulses palpable bilaterally. Temperature gradient within normal limits.  Neuro Epicritic and protective threshold sensation diminished bilaterally.  Musculoskeletal Exam No symptomatic pedal deformities noted bilateral. Muscular strength within normal limits.  ASSESSMENT 1. Onychodystrophic nails 1-5 bilateral with hyperkeratosis of nails.  2. Onychomycosis of nail due to dermatophyte bilateral 3. Pain in foot bilateral  PLAN OF CARE 1. Patient evaluated today.  2. Instructed to maintain good pedal hygiene and foot care.  3. Mechanical debridement of nails 1-5 bilaterally performed using a nail nipper. Filed with dremel without incident.  4. Return to clinic in 3 mos.    Edrick Kins, DPM [facility]  Dr. Edrick Kins, [address]                                        [place], [place] [address]                Office [phone]  Fax [phone]

## 2017-09-23 DIAGNOSIS — Z0001 Encounter for general adult medical examination with abnormal findings: Secondary | ICD-10-CM | POA: Diagnosis not present

## 2017-09-23 DIAGNOSIS — I340 Nonrheumatic mitral (valve) insufficiency: Secondary | ICD-10-CM | POA: Diagnosis not present

## 2017-09-23 DIAGNOSIS — Z79899 Other long term (current) drug therapy: Secondary | ICD-10-CM | POA: Diagnosis not present

## 2017-09-23 DIAGNOSIS — M810 Age-related osteoporosis without current pathological fracture: Secondary | ICD-10-CM | POA: Diagnosis not present

## 2017-09-23 DIAGNOSIS — I10 Essential (primary) hypertension: Secondary | ICD-10-CM | POA: Diagnosis not present

## 2017-09-23 DIAGNOSIS — E119 Type 2 diabetes mellitus without complications: Secondary | ICD-10-CM | POA: Diagnosis not present

## 2017-09-23 DIAGNOSIS — J449 Chronic obstructive pulmonary disease, unspecified: Secondary | ICD-10-CM | POA: Diagnosis not present

## 2017-09-23 DIAGNOSIS — E7800 Pure hypercholesterolemia, unspecified: Secondary | ICD-10-CM | POA: Diagnosis not present

## 2017-10-16 ENCOUNTER — Ambulatory Visit (INDEPENDENT_AMBULATORY_CARE_PROVIDER_SITE_OTHER): Payer: MEDICARE | Admitting: Podiatry

## 2017-10-16 DIAGNOSIS — B351 Tinea unguium: Secondary | ICD-10-CM

## 2017-10-16 DIAGNOSIS — M79676 Pain in unspecified toe(s): Secondary | ICD-10-CM

## 2017-10-20 NOTE — Progress Notes (Signed)
   SUBJECTIVE Patient presents to office today complaining of elongated, thickened nails that cause pain while ambulating in shoes. She is unable to trim her own nails. Patient is here for further evaluation and treatment.  Past Medical History:  Diagnosis Date  . Diabetes mellitus without complication ([place])   . Hyperlipidemia   . Hypertension     OBJECTIVE General Patient is awake, alert, and oriented x 3 and in no acute distress. Derm Skin is dry and supple bilateral. Negative open lesions or macerations. Remaining integument unremarkable. Nails are tender, long, thickened and dystrophic with subungual debris, consistent with onychomycosis, 1-5 bilateral. No signs of infection noted. Vasc  DP and PT pedal pulses palpable bilaterally. Temperature gradient within normal limits.  Neuro Epicritic and protective threshold sensation diminished bilaterally.  Musculoskeletal Exam No symptomatic pedal deformities noted bilateral. Muscular strength within normal limits.  ASSESSMENT 1. Onychodystrophic nails 1-5 bilateral with hyperkeratosis of nails.  2. Onychomycosis of nail due to dermatophyte bilateral 3. Pain in foot bilateral  PLAN OF CARE 1. Patient evaluated today.  2. Instructed to maintain good pedal hygiene and foot care.  3. Mechanical debridement of nails 1-5 bilaterally performed using a nail nipper. Filed with dremel without incident.  4. Return to clinic in 3 mos.    Edrick Kins, DPM [facility]  Dr. Edrick Kins, [address]                                        [place], [place] [address]                Office [phone]  Fax [phone]

## 2017-11-13 DIAGNOSIS — M8588 Other specified disorders of bone density and structure, other site: Secondary | ICD-10-CM | POA: Diagnosis not present

## 2017-11-13 DIAGNOSIS — M810 Age-related osteoporosis without current pathological fracture: Secondary | ICD-10-CM | POA: Diagnosis not present

## 2017-12-31 DIAGNOSIS — M810 Age-related osteoporosis without current pathological fracture: Secondary | ICD-10-CM | POA: Diagnosis not present

## 2018-01-15 ENCOUNTER — Ambulatory Visit (INDEPENDENT_AMBULATORY_CARE_PROVIDER_SITE_OTHER): Payer: MEDICARE | Admitting: Podiatry

## 2018-01-15 ENCOUNTER — Encounter: Payer: Self-pay | Admitting: Podiatry

## 2018-01-15 DIAGNOSIS — M79676 Pain in unspecified toe(s): Secondary | ICD-10-CM | POA: Diagnosis not present

## 2018-01-15 DIAGNOSIS — B351 Tinea unguium: Secondary | ICD-10-CM

## 2018-01-18 NOTE — Progress Notes (Signed)
   SUBJECTIVE Patient presents to office today complaining of elongated, thickened nails that cause pain while ambulating in shoes. She is unable to trim her own nails. Patient is here for further evaluation and treatment.  Past Medical History:  Diagnosis Date  . Diabetes mellitus without complication ([place])   . Hyperlipidemia   . Hypertension     OBJECTIVE General Patient is awake, alert, and oriented x 3 and in no acute distress. Derm Skin is dry and supple bilateral. Negative open lesions or macerations. Remaining integument unremarkable. Nails are tender, long, thickened and dystrophic with subungual debris, consistent with onychomycosis, 1-5 bilateral. No signs of infection noted. Vasc  DP and PT pedal pulses palpable bilaterally. Temperature gradient within normal limits.  Neuro Epicritic and protective threshold sensation grossly intact bilaterally.  Musculoskeletal Exam No symptomatic pedal deformities noted bilateral. Muscular strength within normal limits.  ASSESSMENT 1. Onychodystrophic nails 1-5 bilateral with hyperkeratosis of nails.  2. Onychomycosis of nail due to dermatophyte bilateral 3. Pain in foot bilateral  PLAN OF CARE 1. Patient evaluated today.  2. Instructed to maintain good pedal hygiene and foot care.  3. Mechanical debridement of nails 1-5 bilaterally performed using a nail nipper. Filed with dremel without incident.  4. Return to clinic in 3 mos.    Edrick Kins, DPM [facility]  Dr. Edrick Kins, [address]                                        [place], [place] [address]                Office [phone]  Fax [phone]

## 2018-01-19 ENCOUNTER — Encounter (HOSPITAL_COMMUNITY)
Admission: EM | Disposition: A | Discharge status: Home / Self Care | Payer: Self-pay | Source: Home / Self Care | Attending: Emergency Medicine

## 2018-01-19 ENCOUNTER — Encounter (HOSPITAL_COMMUNITY): Payer: Self-pay | Admitting: Anesthesiology

## 2018-01-19 ENCOUNTER — Emergency Department (HOSPITAL_COMMUNITY): Payer: MEDICARE

## 2018-01-19 ENCOUNTER — Ambulatory Visit (HOSPITAL_COMMUNITY)
Admission: EM | Admit: 2018-01-19 | Discharge: 2018-01-19 | Disposition: A | Discharge status: Home / Self Care | Payer: MEDICARE | Attending: Emergency Medicine | Admitting: Emergency Medicine

## 2018-01-19 ENCOUNTER — Other Ambulatory Visit: Payer: Self-pay

## 2018-01-19 ENCOUNTER — Encounter (HOSPITAL_COMMUNITY): Payer: Self-pay | Admitting: Emergency Medicine

## 2018-01-19 DIAGNOSIS — T18128A Food in esophagus causing other injury, initial encounter: Secondary | ICD-10-CM | POA: Diagnosis not present

## 2018-01-19 DIAGNOSIS — K2210 Ulcer of esophagus without bleeding: Secondary | ICD-10-CM | POA: Insufficient documentation

## 2018-01-19 DIAGNOSIS — Z7984 Long term (current) use of oral hypoglycemic drugs: Secondary | ICD-10-CM | POA: Diagnosis not present

## 2018-01-19 DIAGNOSIS — E785 Hyperlipidemia, unspecified: Secondary | ICD-10-CM | POA: Diagnosis not present

## 2018-01-19 DIAGNOSIS — K449 Diaphragmatic hernia without obstruction or gangrene: Secondary | ICD-10-CM | POA: Insufficient documentation

## 2018-01-19 DIAGNOSIS — E119 Type 2 diabetes mellitus without complications: Secondary | ICD-10-CM | POA: Insufficient documentation

## 2018-01-19 DIAGNOSIS — K208 Other esophagitis: Secondary | ICD-10-CM | POA: Diagnosis not present

## 2018-01-19 DIAGNOSIS — R1310 Dysphagia, unspecified: Secondary | ICD-10-CM | POA: Diagnosis not present

## 2018-01-19 DIAGNOSIS — Z7982 Long term (current) use of aspirin: Secondary | ICD-10-CM | POA: Insufficient documentation

## 2018-01-19 DIAGNOSIS — I10 Essential (primary) hypertension: Secondary | ICD-10-CM | POA: Diagnosis not present

## 2018-01-19 HISTORY — PX: ESOPHAGOGASTRODUODENOSCOPY (EGD) WITH PROPOFOL: SHX5813

## 2018-01-19 HISTORY — PX: BIOPSY: SHX5522

## 2018-01-19 LAB — COMPREHENSIVE METABOLIC PANEL
ALBUMIN: 3.2 g/dL — AB (ref 3.5–5.0)
ALK PHOS: 55 U/L (ref 38–126)
ALT: 16 U/L (ref 0–44)
ANION GAP: 12 (ref 5–15)
AST: 17 U/L (ref 15–41)
BILIRUBIN TOTAL: 1.3 mg/dL — AB (ref 0.3–1.2)
BUN: 24 mg/dL — ABNORMAL HIGH (ref 8–23)
CALCIUM: 9 mg/dL (ref 8.9–10.3)
CO2: 23 mmol/L (ref 22–32)
Chloride: 99 mmol/L (ref 98–111)
Creatinine, Ser: 0.99 mg/dL (ref 0.44–1.00)
GFR calc Af Amer: 55 mL/min — ABNORMAL LOW (ref >60)
GFR, EST NON AFRICAN AMERICAN: 48 mL/min — AB (ref >60)
Glucose, Bld: 188 mg/dL — ABNORMAL HIGH (ref 70–99)
Potassium: 4.1 mmol/L (ref 3.5–5.1)
Sodium: 134 mmol/L — ABNORMAL LOW (ref 135–145)
TOTAL PROTEIN: 6.4 g/dL — AB (ref 6.5–8.1)

## 2018-01-19 LAB — CBC
HCT: 39.5 % (ref 36.0–46.0)
HEMOGLOBIN: 12.7 g/dL (ref 12.0–15.0)
MCH: 30.6 pg (ref 26.0–34.0)
MCHC: 32.2 g/dL (ref 30.0–36.0)
MCV: 95.2 fL (ref 78.0–100.0)
Platelets: 256 10*3/uL (ref 150–400)
RBC: 4.15 MIL/uL (ref 3.87–5.11)
RDW: 13.6 % (ref 11.5–15.5)
WBC: 6.4 10*3/uL (ref 4.0–10.5)

## 2018-01-19 SURGERY — ESOPHAGOGASTRODUODENOSCOPY (EGD) WITH PROPOFOL
Anesthesia: Monitor Anesthesia Care

## 2018-01-19 MED ORDER — SODIUM CHLORIDE 0.9 % IV SOLN: INTRAVENOUS | Status: DC

## 2018-01-19 MED ORDER — SODIUM CHLORIDE 0.9 % IV BOLUS
1000.0000 mL | Freq: Once | INTRAVENOUS | Status: AC
  Administered 2018-01-19: 1000 mL via INTRAVENOUS

## 2018-01-19 MED ORDER — MIDAZOLAM HCL 5 MG/ML IJ SOLN
INTRAMUSCULAR | Status: AC
  Filled 2018-01-19: qty 2

## 2018-01-19 MED ORDER — MIDAZOLAM HCL 10 MG/2ML IJ SOLN
INTRAMUSCULAR | Status: DC | PRN
  Administered 2018-01-19 (×3): 1 mg via INTRAVENOUS

## 2018-01-19 MED ORDER — GLUCAGON HCL RDNA (DIAGNOSTIC) 1 MG IJ SOLR
1.0000 mg | Freq: Once | INTRAMUSCULAR | Status: AC
  Administered 2018-01-19: 1 mg via INTRAVENOUS
  Filled 2018-01-19: qty 1

## 2018-01-19 MED ORDER — DIATRIZOATE MEGLUMINE & SODIUM 66-10 % PO SOLN
ORAL | Status: AC
  Filled 2018-01-19: qty 120

## 2018-01-19 MED ORDER — FENTANYL CITRATE (PF) 100 MCG/2ML IJ SOLN
INTRAMUSCULAR | Status: DC | PRN
  Administered 2018-01-19 (×2): 12.5 ug via INTRAVENOUS
  Administered 2018-01-19: 25 ug via INTRAVENOUS

## 2018-01-19 MED ORDER — SUCRALFATE 1 GM/10ML PO SUSP: 1.0000 g | Freq: Three times a day (TID) | ORAL | 0 refills | Status: DC

## 2018-01-19 MED ORDER — FENTANYL CITRATE (PF) 100 MCG/2ML IJ SOLN
INTRAMUSCULAR | Status: AC
  Filled 2018-01-19: qty 2

## 2018-01-19 MED ORDER — ONDANSETRON HCL 4 MG/2ML IJ SOLN
4.0000 mg | Freq: Once | INTRAMUSCULAR | Status: AC
  Administered 2018-01-19: 4 mg via INTRAVENOUS
  Filled 2018-01-19: qty 2

## 2018-01-19 MED ORDER — PANTOPRAZOLE SODIUM 20 MG PO TBEC: 20.0000 mg | DELAYED_RELEASE_TABLET | Freq: Every day | ORAL | 0 refills | Status: DC

## 2018-01-19 SURGICAL SUPPLY — 14 items

## 2018-01-19 NOTE — Op Note (Signed)
[facility] Patient Name: Robin Mcclure Procedure Date : 01/19/2018 MRN: [id number] Attending MD: Ronnette Juniper , MD Date of Birth: 09/14/1922 CSN: [id number] Age: 84 Admit Type: Emergency Department Procedure:                Upper GI endoscopy Indications:              Dysphagia, unable to swallow solids, liquids or                            pills for 4 days Providers:                Ronnette Juniper, MD, Cleda Daub, RN, Nevin Bloodgood,                            Technician Referring MD:              Medicines:                Midazolam 3 mg IV, Fentanyl 50 micrograms IV Complications:            No immediate complications. Estimated blood loss:                            None. Estimated Blood Loss:     Estimated blood loss: none. Procedure:                Pre-Anesthesia Assessment:                           - Prior to the procedure, a History and Physical                            was performed, and patient medications and                            allergies were reviewed. The patient's tolerance of                            previous anesthesia was also reviewed. The risks                            and benefits of the procedure and the sedation                            options and risks were discussed with the patient.                            All questions were answered, and informed consent                            was obtained. Prior Anticoagulants: The patient has                            taken aspirin, last dose was 4 days prior to  procedure. ASA Grade Assessment: III - A patient                            with severe systemic disease. After reviewing the                            risks and benefits, the patient was deemed in                            satisfactory condition to undergo the procedure.                           After obtaining informed consent, the endoscope was                            passed under direct vision.  Throughout the                            procedure, the patient's blood pressure, pulse, and                            oxygen saturations were monitored continuously. The                            [id number]-H190 ([id number]) Olympus Adult EGD was introduced                            through the mouth, and advanced to the second part                            of duodenum. The upper GI endoscopy was                            accomplished without difficulty. The patient                            tolerated the procedure well. Scope In: Scope Out: Findings:      Few superficial esophageal ulcers with no bleeding and no stigmata of       recent bleeding were found 30 to 36 cm from the incisors. Biopsies were       taken with a cold forceps for histology.      LA Grade C (one or more mucosal breaks continuous between tops of 2 or       more mucosal folds, less than 75% circumference) esophagitis with no       bleeding was found 30 to 36 cm from the incisors.      A 2 cm hiatal hernia was present.      The entire examined stomach was normal.      The cardia and gastric fundus were normal on retroflexion.      The examined duodenum was normal. Impression:               - Non-bleeding esophageal ulcers. Biopsied.                           -  LA Grade C erosive esophagitis.                           - 2 cm hiatal hernia.                           - Normal stomach.                           - Normal examined duodenum. Recommendation:           - Patient has a contact number available for                            emergencies. The signs and symptoms of potential                            delayed complications were discussed with the                            patient. Return to normal activities tomorrow.                            Written discharge instructions were provided to the                            patient.                           - Mechanical soft diet.                           -  Continue present medications.                           - Await pathology results.                           - Use Protonix (pantoprazole) 40 mg PO BID for 4                            weeks.                           - Use sucralfate suspension 1 gram PO QID for 2                            weeks. Procedure Code(s):        --- Professional ---                           [phone], Esophagogastroduodenoscopy, flexible,                            transoral; with biopsy, single or multiple Diagnosis Code(s):        --- Professional ---  K22.10, Ulcer of esophagus without bleeding                           K20.8, Other esophagitis                           K44.9, Diaphragmatic hernia without obstruction or                            gangrene                           R13.10, Dysphagia, unspecified CPT copyright 2017 [employer]. All rights reserved. The codes documented in this report are preliminary and upon coder review may  be revised to meet current compliance requirements. Ronnette Juniper, MD 01/19/2018 3:45:22 PM This report has been signed electronically. Number of Addenda: 0

## 2018-01-19 NOTE — ED Provider Notes (Signed)
[facility] DEPART[facility] Provider Note   CSN: [id number] Arrival date & time: 01/19/18  1113     History   Chief Complaint Chief Complaint  Patient presents with  . Dysphagia  . Emesis    HPI Robin Mcclure is a 84 y.o. female.  84 yo F with a chief complaint of difficulty swallowing and emesis.  This been going on for 4 days.  She had a normal day on Thursday and then when she went to bed she started vomiting.  She had a eggplant Parmesan for dinner.  Since then every time that she eats something she feels that it slowly goes down and then gets stuck just up above her epigastrium and then she ends up inevitably throwing it up.  She is never had a problem like this before.  She has tried little sips of water as well as pureing food but that has not improved it.  She is starting to have a little bit of epigastric abdominal pain, that she attributes to recurrent bouts of emesis.  The history is provided by the patient.  Emesis   This is a new problem. The current episode started more than 2 days ago. The problem occurs 2 to 4 times per day. The problem has not changed since onset.The emesis has an appearance of stomach contents. Pertinent negatives include no arthralgias, no chills, no fever, no headaches and no myalgias.    Past Medical History:  Diagnosis Date  . Diabetes mellitus without complication ([place])   . Hyperlipidemia   . Hypertension     There are no active problems to display for this patient.   Past Surgical History:  Procedure Laterality Date  . ABDOMINAL HYSTERECTOMY    . CHOLECYSTECTOMY    . HIP SURGERY       OB History   None      Home Medications    Prior to Admission medications   Medication Sig Start Date End Date Taking? Authorizing Provider  acetaminophen (TYLENOL) 325 MG tablet Take 325 mg by mouth every 6 (six) hours as needed for mild pain.   Yes [provider]  albuterol (PROVENTIL HFA;VENTOLIN HFA) 108  (90 Base) MCG/ACT inhaler Inhale 1-2 puffs into the lungs every 6 (six) hours as needed for wheezing or shortness of breath.   Yes [provider]  aspirin 81 MG EC tablet Take 81 mg by mouth every Monday, Wednesday, and Friday. Swallow whole.    Yes [provider]  B Complex Vitamins (B COMPLEX PO) Take 1 tablet by mouth daily.   Yes [provider]  Cholecalciferol (D3-1000) 1000 units tablet Take 1,000 Units by mouth daily.    Yes [provider]  CRANBERRY CONCENTRATE PO Take 25,000 Units by mouth daily.    Yes [provider]  denosumab (PROLIA) 60 MG/ML SOLN injection Inject 60 mg into the skin every 6 (six) months. Administer in upper arm, thigh, or abdomen   Yes [provider]  Fluticasone-Salmeterol (ADVAIR DISKUS) 500-50 MCG/DOSE AEPB Inhale 1 puff into the lungs 2 (two) times daily.   Yes [provider]  furosemide (LASIX) 40 MG tablet Take 40 mg by mouth daily.    Yes [provider]  glipiZIDE (GLUCOTROL XL) 2.5 MG 24 hr tablet Take 2.5 mg by mouth daily with breakfast.   Yes [provider]  losartan (COZAAR) 50 MG tablet Take 50 mg by mouth daily.   Yes [provider]  Multiple Minerals (  CALCIUM/MAGNESIUM/ZINC PO) Take 1 tablet by mouth daily.   Yes [provider]  Multiple Vitamins-Minerals ([employer] SPECTRAVITE SENIOR PO) Take 1 tablet by mouth daily.    Yes [provider]  NIFEdipine (ADALAT CC) 90 MG 24 hr tablet Take 90 mg by mouth daily.   Yes [provider]  pravastatin (PRAVACHOL) 20 MG tablet Take 20 mg by mouth daily.   Yes [provider]  sitaGLIPtin-metformin (JANUMET) 50-1000 MG tablet Take 1 tablet by mouth daily.    Yes [provider]  pantoprazole (PROTONIX) 20 MG tablet Take 1 tablet (20 mg total) by mouth daily. 01/19/18   Deno Etienne, DO  sucralfate (CARAFATE) 1 GM/10ML suspension Take 10 mLs (1 g total) by mouth 4 (four) times daily  -  with meals and at bedtime. 01/19/18   Deno Etienne, DO    Family History History reviewed. No pertinent family history.  Social History Social History   Tobacco Use  . Smoking status: Never Smoker  . Smokeless tobacco: Never Used  Substance Use Topics  . Alcohol use: No  . Drug use: No     Allergies   Patient has no known allergies.   Review of Systems Review of Systems  Constitutional: Negative for chills and fever.  HENT: Negative for congestion and rhinorrhea.   Eyes: Negative for redness and visual disturbance.  Respiratory: Negative for shortness of breath and wheezing.   Cardiovascular: Negative for chest pain and palpitations.  Gastrointestinal: Positive for vomiting. Negative for nausea.  Genitourinary: Negative for dysuria and urgency.  Musculoskeletal: Negative for arthralgias and myalgias.  Skin: Negative for pallor and wound.  Neurological: Negative for dizziness and headaches.     Physical Exam Updated Vital Signs BP (!) 144/69   Pulse 85   Temp 98.3 F (36.8 C)   Resp (!) 21   Ht 5\' 3"  (1.6 m)   Wt 72 kg   SpO2 97%   BMI 28.13 kg/m   Physical Exam  Constitutional: She is oriented to person, place, and time. She appears well-developed and well-nourished. No distress.  HENT:  Head: Normocephalic and atraumatic.  Eyes: Pupils are equal, round, and reactive to light. EOM are normal.  Neck: Normal range of motion. Neck supple.  Cardiovascular: Normal rate and regular rhythm. Exam reveals no gallop and no friction rub.  No murmur heard. Pulmonary/Chest: Effort normal. She has no wheezes. She has no rales.  Abdominal: Soft. She exhibits no distension. There is tenderness (diffuse upper abdomen).  Musculoskeletal: She exhibits no edema or tenderness.  Neurological: She is alert and oriented to person, place, and time.  Skin: Skin is warm and dry. She is not diaphoretic.  Psychiatric: She has a normal mood and affect. Her behavior is normal.    Nursing note and vitals reviewed.    ED Treatments / Results  Labs (all labs ordered are listed, but only abnormal results are displayed) Labs Reviewed  COMPREHENSIVE METABOLIC PANEL - Abnormal; Notable for the following components:      Result Value   Sodium 134 (*)    Glucose, Bld 188 (*)    BUN 24 (*)    Total Protein 6.4 (*)    Albumin 3.2 (*)    Total Bilirubin 1.3 (*)    GFR calc non Af Amer 48 (*)    GFR calc Af Amer 55 (*)    All other components within normal limits  CBC  URINALYSIS, ROUTINE W REFLEX MICROSCOPIC  SURGICAL PATHOLOGY  EKG None  Radiology No results found.  Procedures Procedures (including critical care time)  Medications Ordered in ED Medications  0.9 %  sodium chloride infusion (has no administration in time range)  fentaNYL (SUBLIMAZE) injection (12.5 mcg Intravenous Given 01/19/18 1528)  midazolam (VERSED) injection (1 mg Intravenous Given 01/19/18 1528)  diatrizoate meglumine-sodium (GASTROGRAFIN) 66-10 % solution (has no administration in time range)  ondansetron (ZOFRAN) injection 4 mg (4 mg Intravenous Given 01/19/18 1246)  glucagon (human recombinant) (GLUCAGEN) injection 1 mg (1 mg Intravenous Given 01/19/18 1246)  sodium chloride 0.9 % bolus 1,000 mL (0 mLs Intravenous Stopped 01/19/18 1413)     Initial Impression / Assessment and Plan / ED Course  I have reviewed the triage vital signs and the nursing notes.  Pertinent labs & imaging results that were available during my care of the patient were reviewed by me and considered in my medical decision making (see chart for details).     84 yo F with a chief complaint of difficulty swallowing.  This is been going on for the past 4 days.  She denies any fevers.  She feels that food goes slowly down her throat and then gets stuck and then she ends up throwing it up undigested.  She has tried little sips of water as well as pureing food without improvement.  She denies any coughing or  shortness of breath.  Will try glucagon, will discuss with GI.   Gi evaluated at bedside.  EGD performed.  No noted FB, but diffuse erosions.  Recommended starting on carafate and protonix.  Will follow up in the office.   3:48 PM:  I have discussed the diagnosis/risks/treatment options with the patient and family and believe the pt to be eligible for discharge home to follow-up with GI. We also discussed returning to the ED immediately if new or worsening sx occur. We discussed the sx which are most concerning (e.g., sudden worsening pain, fever, inability to tolerate by mouth) that necessitate immediate return. Medications administered to the patient during their visit and any new prescriptions provided to the patient are listed below.  Medications given during this visit Medications  0.9 %  sodium chloride infusion (has no administration in time range)  fentaNYL (SUBLIMAZE) injection (12.5 mcg Intravenous Given 01/19/18 1528)  midazolam (VERSED) injection (1 mg Intravenous Given 01/19/18 1528)  diatrizoate meglumine-sodium (GASTROGRAFIN) 66-10 % solution (has no administration in time range)  ondansetron (ZOFRAN) injection 4 mg (4 mg Intravenous Given 01/19/18 1246)  glucagon (human recombinant) (GLUCAGEN) injection 1 mg (1 mg Intravenous Given 01/19/18 1246)  sodium chloride 0.9 % bolus 1,000 mL (0 mLs Intravenous Stopped 01/19/18 1413)      The patient appears reasonably screen and/or stabilized for discharge and I doubt any other medical condition or other [facility] requiring further screening, evaluation, or treatment in the ED at this time prior to discharge.    Final Clinical Impressions(s) / ED Diagnoses   Final diagnoses:  Dysphagia    ED Discharge Orders         Ordered    sucralfate (CARAFATE) 1 GM/10ML suspension  3 times daily with meals & bedtime     01/19/18 1541    pantoprazole (PROTONIX) 20 MG tablet  Daily     01/19/18 Dixon, Sartaj Hoskin, DO 01/19/18 1548

## 2018-01-19 NOTE — Discharge Instructions (Addendum)
Follow up with the GI doctor in the office.  Let you PCP know about your visit here.

## 2018-01-19 NOTE — ED Notes (Signed)
Endoscopy at beside

## 2018-01-19 NOTE — ED Triage Notes (Signed)
Pt states that on Thursday after eating she immediately threw up and also had diarrhea. She only had the diarrhea on Thursday. States every time she eats or drinks anything she throws it right back up, almost feels like something is stuck in her throat and she is having difficulty swallowing. Pt also reports frequent belching.

## 2018-01-19 NOTE — ED Notes (Signed)
Patient verbalizes understanding of discharge instructions. Opportunity for questioning and answers were provided. Armband removed by staff, pt discharged from ED.  

## 2018-01-19 NOTE — Consult Note (Signed)
[facility] Consult  Referring Provider: Dr.Floyd /ER Primary Care Physician:  Patient, No Pcp Per Primary Gastroenterologist: Althia Forts  Reason for Consultation:  dysphagia  HPI: Robin Mcclure is a 84 y.o. female presents to the ED with complains of difficulty swallowing solids, liquids or pills since Thursday. She feels like there is food stuck in the lower part of the chest. Every time she has tried to eat or drink something it comes right back up. She reports eating minimal amount of blood after multiple episodes of retching. Prior to this she denies having difficulty swallowing or pain on swallowing. She had a small bowel movement today and denies seeing blood in stool or black stools. She complains of soreness in the upper abdomen because of multiple episodes of retching and vomiting. Patient denies recent loss of appetite or unintentional weight loss. She has a history of colon cancer which required resection in 1980 and has had periodic surveillance colonoscopies since. Last colonoscopy was performed at age 35, was unremarkable and was advised not to have any further colonoscopies. She takes aspirin 81 mg per day and the last dose was taken on Thursday.   Past Medical History:  Diagnosis Date  . Diabetes mellitus without complication ([place])   . Hyperlipidemia   . Hypertension     Past Surgical History:  Procedure Laterality Date  . ABDOMINAL HYSTERECTOMY    . CHOLECYSTECTOMY    . HIP SURGERY      Prior to Admission medications   Medication Sig Start Date End Date Taking? Authorizing Provider  acetaminophen (TYLENOL) 325 MG tablet Take 325 mg by mouth every 6 (six) hours as needed for mild pain.   Yes [provider]  albuterol (PROVENTIL HFA;VENTOLIN HFA) 108 (90 Base) MCG/ACT inhaler Inhale 1-2 puffs into the lungs every 6 (six) hours as needed for wheezing or shortness of breath.   Yes [provider]  aspirin 81 MG EC tablet Take 81 mg by mouth  every Monday, Wednesday, and Friday. Swallow whole.    Yes [provider]  B Complex Vitamins (B COMPLEX PO) Take 1 tablet by mouth daily.   Yes [provider]  Cholecalciferol (D3-1000) 1000 units tablet Take 1,000 Units by mouth daily.    Yes [provider]  CRANBERRY CONCENTRATE PO Take 25,000 Units by mouth daily.    Yes [provider]  denosumab (PROLIA) 60 MG/ML SOLN injection Inject 60 mg into the skin every 6 (six) months. Administer in upper arm, thigh, or abdomen   Yes [provider]  Fluticasone-Salmeterol (ADVAIR DISKUS) 500-50 MCG/DOSE AEPB Inhale 1 puff into the lungs 2 (two) times daily.   Yes [provider]  furosemide (LASIX) 40 MG tablet Take 40 mg by mouth daily.    Yes [provider]  glipiZIDE (GLUCOTROL XL) 2.5 MG 24 hr tablet Take 2.5 mg by mouth daily with breakfast.   Yes [provider]  losartan (COZAAR) 50 MG tablet Take 50 mg by mouth daily.   Yes [provider]  Multiple Minerals (CALCIUM/MAGNESIUM/ZINC PO) Take 1 tablet by mouth daily.   Yes [provider]  Multiple Vitamins-Minerals ([employer] SPECTRAVITE SENIOR PO) Take 1 tablet by mouth daily.    Yes [provider]  NIFEdipine (ADALAT CC) 90 MG 24 hr tablet Take 90 mg by mouth daily.   Yes [provider]  pravastatin (PRAVACHOL) 20 MG tablet Take 20 mg by mouth daily.   Yes [provider]  sitaGLIPtin-metformin (JANUMET) 50-1000 MG  tablet Take 1 tablet by mouth daily.    Yes [provider]    No current facility-administered medications for this encounter.    Current Outpatient Medications  Medication Sig Dispense Refill  . acetaminophen (TYLENOL) 325 MG tablet Take 325 mg by mouth every 6 (six) hours as needed for mild pain.    Marland Kitchen albuterol (PROVENTIL HFA;VENTOLIN HFA) 108 (90 Base) MCG/ACT inhaler Inhale 1-2 puffs into the lungs every 6 (six) hours as needed for wheezing or  shortness of breath.    Marland Kitchen aspirin 81 MG EC tablet Take 81 mg by mouth every Monday, Wednesday, and Friday. Swallow whole.     . B Complex Vitamins (B COMPLEX PO) Take 1 tablet by mouth daily.    . Cholecalciferol (D3-1000) 1000 units tablet Take 1,000 Units by mouth daily.     Marland Kitchen CRANBERRY CONCENTRATE PO Take 25,000 Units by mouth daily.     Marland Kitchen denosumab (PROLIA) 60 MG/ML SOLN injection Inject 60 mg into the skin every 6 (six) months. Administer in upper arm, thigh, or abdomen    . Fluticasone-Salmeterol (ADVAIR DISKUS) 500-50 MCG/DOSE AEPB Inhale 1 puff into the lungs 2 (two) times daily.    . furosemide (LASIX) 40 MG tablet Take 40 mg by mouth daily.     Marland Kitchen glipiZIDE (GLUCOTROL XL) 2.5 MG 24 hr tablet Take 2.5 mg by mouth daily with breakfast.    . losartan (COZAAR) 50 MG tablet Take 50 mg by mouth daily.    . Multiple Minerals (CALCIUM/MAGNESIUM/ZINC PO) Take 1 tablet by mouth daily.    . Multiple Vitamins-Minerals ([employer] SPECTRAVITE SENIOR PO) Take 1 tablet by mouth daily.     Marland Kitchen NIFEdipine (ADALAT CC) 90 MG 24 hr tablet Take 90 mg by mouth daily.    . pravastatin (PRAVACHOL) 20 MG tablet Take 20 mg by mouth daily.    . sitaGLIPtin-metformin (JANUMET) 50-1000 MG tablet Take 1 tablet by mouth daily.       Allergies as of 01/19/2018  . (No Known Allergies)    No family history on file.  Social History   Socioeconomic History  . Marital status: Widowed    Spouse name: Not on file  . Number of children: Not on file  . Years of education: Not on file  . Highest education level: Not on file  Occupational History  . Not on file  Social Needs  . Financial resource strain: Not on file  . Food insecurity:    Worry: Not on file    Inability: Not on file  . Transportation needs:    Medical: Not on file    Non-medical: Not on file  Tobacco Use  . Smoking status: Never Smoker  . Smokeless tobacco: Never Used  Substance and Sexual Activity  . Alcohol use: No  . Drug use: No  . Sexual  activity: Not on file  Lifestyle  . Physical activity:    Days per week: Not on file    Minutes per session: Not on file  . Stress: Not on file  Relationships  . Social connections:    Talks on phone: Not on file    Gets together: Not on file    Attends religious service: Not on file    Active member of club or organization: Not on file    Attends meetings of clubs or organizations: Not on file    Relationship status: Not on file  . Intimate partner violence:    Fear of current or ex  partner: Not on file    Emotionally abused: Not on file    Physically abused: Not on file    Forced sexual activity: Not on file  Other Topics Concern  . Not on file  Social History Narrative  . Not on file    Review of Systems:  GI: Described in detail in HPI.    Gen: Denies any fever, chills, rigors, night sweats, anorexia, fatigue, weakness, malaise, involuntary weight loss, and sleep disorder CV: Denies chest pain, angina, palpitations, syncope, orthopnea, PND, peripheral edema, and claudication. Resp: Denies dyspnea, cough, sputum, wheezing, coughing up blood. GU : Denies urinary burning, blood in urine, urinary frequency, urinary hesitancy, nocturnal urination, and urinary incontinence. MS: Denies joint pain or swelling.  Denies muscle weakness, cramps, atrophy.  Derm: Denies rash, itching, oral ulcerations, hives, unhealing ulcers.  Psych: Denies depression, anxiety, memory loss, suicidal ideation, hallucinations,  and confusion. Heme: Denies bruising, bleeding, and enlarged lymph nodes. Neuro:  Denies any headaches, dizziness, paresthesias. Endo:   DM, Denies any problems with thyroid or adrenal function.  Physical Exam: Vital signs in last 24 hours: Temp:  [98.3 F (36.8 C)] 98.3 F (36.8 C) (09/01 1120) Pulse Rate:  [89-158] 154 (09/01 1430) Resp:  [14-21] 21 (09/01 1430) BP: (143-159)/(57-102) 149/57 (09/01 1430) SpO2:  [96 %-99 %] 96 % (09/01 1430) Weight:  [72 kg] 72 kg (09/01  1213)    General:   Alert,  Well-developed, well-nourished, pleasant and cooperative in NAD Able to speak in full sentences, no respiratory distress, no drooling noted Head:  Normocephalic and atraumatic. Eyes:  Sclera clear, no icterus.   Conjunctiva pink. Ears:  Normal auditory acuity. Nose:  No deformity, discharge,  or lesions. Mouth:  No deformity or lesions.  Oropharynx pink & moist. Neck:  Supple; no masses or thyromegaly. Lungs:  Clear throughout to auscultation.   No wheezes, crackles, or rhonchi. No acute distress. Heart:  Regular rate and rhythm; no murmurs, clicks, rubs,  or gallops. Extremities:  Without clubbing or edema. Neurologic:  Alert and  oriented x4;  grossly normal neurologically. Skin:  Intact without significant lesions or rashes. Psych:  Alert and cooperative. Normal mood and affect. Abdomen:  Soft, mild upper abdominal tenderness and nondistended. No masses, hepatosplenomegaly or hernias noted. Normal bowel sounds, without guarding, and without rebound.         Lab Results: Recent Labs    01/19/18 1126  WBC 6.4  HGB 12.7  HCT 39.5  PLT 256   BMET Recent Labs    01/19/18 1126  NA 134*  K 4.1  CL 99  CO2 23  GLUCOSE 188*  BUN 24*  CREATININE 0.99  CALCIUM 9.0   LFT Recent Labs    01/19/18 1126  PROT 6.4*  ALBUMIN 3.2*  AST 17  ALT 16  ALKPHOS 55  BILITOT 1.3*   PT/INR No results for input(s): LABPROT, INR in the last 72 hours.  Studies/Results: No results found.  Impression: Dysphagia to solids, liquids and pills since Thursday. Since it is acute in onset,it raises the possibility of esophageal food impaction.  Plan: Diagnostic/therapeutic EGD to be performed at bedside. We do not have anesthesia availability currently.  Procedure to be performed under moderate sedation. The risks and benefits of the procedure were discussed with the patient in details. She understands and verbalizes consent.    LOS: 0 days   Ronnette Juniper, M.D.  01/19/2018, 2:38 PM  Pager [phone] If no answer or after 5 PM  call [phone]

## 2018-01-19 NOTE — ED Notes (Signed)
Pt aware of need for urine sample, endorses poor oral intake over the past few days

## 2018-03-24 DIAGNOSIS — H353132 Nonexudative age-related macular degeneration, bilateral, intermediate dry stage: Secondary | ICD-10-CM | POA: Diagnosis not present

## 2018-03-24 DIAGNOSIS — E113293 Type 2 diabetes mellitus with mild nonproliferative diabetic retinopathy without macular edema, bilateral: Secondary | ICD-10-CM | POA: Diagnosis not present

## 2018-03-26 DIAGNOSIS — E7800 Pure hypercholesterolemia, unspecified: Secondary | ICD-10-CM | POA: Diagnosis not present

## 2018-03-26 DIAGNOSIS — J449 Chronic obstructive pulmonary disease, unspecified: Secondary | ICD-10-CM | POA: Diagnosis not present

## 2018-03-26 DIAGNOSIS — Z23 Encounter for immunization: Secondary | ICD-10-CM | POA: Diagnosis not present

## 2018-03-26 DIAGNOSIS — E119 Type 2 diabetes mellitus without complications: Secondary | ICD-10-CM | POA: Diagnosis not present

## 2018-03-26 DIAGNOSIS — M810 Age-related osteoporosis without current pathological fracture: Secondary | ICD-10-CM | POA: Diagnosis not present

## 2018-03-26 DIAGNOSIS — Z79899 Other long term (current) drug therapy: Secondary | ICD-10-CM | POA: Diagnosis not present

## 2018-03-26 DIAGNOSIS — I340 Nonrheumatic mitral (valve) insufficiency: Secondary | ICD-10-CM | POA: Diagnosis not present

## 2018-03-26 DIAGNOSIS — I10 Essential (primary) hypertension: Secondary | ICD-10-CM | POA: Diagnosis not present

## 2018-03-26 DIAGNOSIS — K208 Other esophagitis: Secondary | ICD-10-CM | POA: Diagnosis not present

## 2018-04-21 ENCOUNTER — Ambulatory Visit (INDEPENDENT_AMBULATORY_CARE_PROVIDER_SITE_OTHER): Payer: MEDICARE | Admitting: Podiatry

## 2018-04-21 DIAGNOSIS — M79676 Pain in unspecified toe(s): Secondary | ICD-10-CM

## 2018-04-21 DIAGNOSIS — B351 Tinea unguium: Secondary | ICD-10-CM

## 2018-04-21 NOTE — Patient Instructions (Signed)

## 2018-04-22 ENCOUNTER — Encounter: Payer: Self-pay | Admitting: Podiatry

## 2018-04-22 NOTE — Progress Notes (Signed)
Subjective: Robin Mcclure presents today for preventative care with painful, thick toenails 1-5 b/l that she cannot cut and which interfere with daily activities.  Pain is aggravated when wearing enclosed shoe gear.  Objective: Vascular Examination: Capillary refill time is immediate to all 10 digits. Dorsalis pedis and posterior pulses are palpable bilaterally.  Skin temperature gradient within normal limits bilaterally.  No edema noted bilaterally.  Dermatological Examination: Skin with age-related atrophy.  Normal texture noted.  Toenails 1-5 b/l discolored, thick, dystrophic with subungual debris and pain with palpation to nailbeds due to thickness of nails.  No open wounds.  No interdigital macerations noted.  Musculoskeletal: Muscle strength 5/5 to all LE muscle groups. No gross skeletal deformities noted.  Neurological: Sensation intact with 10 gram monofilament. Vibratory sensation intact.  Assessment: Painful onychomycosis toenails 1-5 b/l   Plan: 1. Toenails 1-5 b/l were debrided in length and girth without iatrogenic bleeding. 2. Patient to continue soft, supportive shoe gear 3. Patient to report any pedal injuries to medical professional immediately. 4. Follow up 3 months.  5. Patient/POA to call should there be a concern in the interim.

## 2018-05-19 DIAGNOSIS — H903 Sensorineural hearing loss, bilateral: Secondary | ICD-10-CM | POA: Diagnosis not present

## 2018-05-19 DIAGNOSIS — H6121 Impacted cerumen, right ear: Secondary | ICD-10-CM | POA: Diagnosis not present

## 2018-06-06 DIAGNOSIS — H1013 Acute atopic conjunctivitis, bilateral: Secondary | ICD-10-CM | POA: Diagnosis not present

## 2018-06-16 DIAGNOSIS — H1013 Acute atopic conjunctivitis, bilateral: Secondary | ICD-10-CM | POA: Diagnosis not present

## 2018-07-04 DIAGNOSIS — M810 Age-related osteoporosis without current pathological fracture: Secondary | ICD-10-CM | POA: Diagnosis not present

## 2018-07-21 ENCOUNTER — Ambulatory Visit (INDEPENDENT_AMBULATORY_CARE_PROVIDER_SITE_OTHER): Payer: MEDICARE | Admitting: Podiatry

## 2018-07-21 ENCOUNTER — Other Ambulatory Visit: Payer: Self-pay

## 2018-07-21 ENCOUNTER — Encounter: Payer: Self-pay | Admitting: Podiatry

## 2018-07-21 DIAGNOSIS — M79676 Pain in unspecified toe(s): Secondary | ICD-10-CM | POA: Diagnosis not present

## 2018-07-21 DIAGNOSIS — B351 Tinea unguium: Secondary | ICD-10-CM | POA: Diagnosis not present

## 2018-07-21 NOTE — Patient Instructions (Signed)

## 2018-07-27 NOTE — Progress Notes (Signed)
Subjective: Robin Mcclure presents today with painful, thick toenails 1-5 b/l that she cannot cut and which interfere with daily activities.  Pain is aggravated when wearing enclosed shoe gear.  [name], [name], MD is her PCP.    Current Outpatient Medications:  .  acetaminophen (TYLENOL) 325 MG tablet, Take 325 mg by mouth every 6 (six) hours as needed for mild pain., Disp: , Rfl:  .  albuterol (PROVENTIL HFA;VENTOLIN HFA) 108 (90 Base) MCG/ACT inhaler, Inhale 1-2 puffs into the lungs every 6 (six) hours as needed for wheezing or shortness of breath., Disp: , Rfl:  .  aspirin 81 MG EC tablet, Take 81 mg by mouth every Monday, Wednesday, and Friday. Swallow whole. , Disp: , Rfl:  .  B Complex Vitamins (B COMPLEX PO), Take 1 tablet by mouth daily., Disp: , Rfl:  .  Cholecalciferol (D3-1000) 1000 units tablet, Take 1,000 Units by mouth daily. , Disp: , Rfl:  .  CRANBERRY CONCENTRATE PO, Take 25,000 Units by mouth daily. , Disp: , Rfl:  .  denosumab (PROLIA) 60 MG/ML SOLN injection, Inject 60 mg into the skin every 6 (six) months. Administer in upper arm, thigh, or abdomen, Disp: , Rfl:  .  Fluticasone-Salmeterol (ADVAIR DISKUS) 500-50 MCG/DOSE AEPB, Inhale 1 puff into the lungs 2 (two) times daily., Disp: , Rfl:  .  furosemide (LASIX) 40 MG tablet, Take 40 mg by mouth daily. , Disp: , Rfl:  .  glipiZIDE (GLUCOTROL XL) 2.5 MG 24 hr tablet, Take 2.5 mg by mouth daily with breakfast., Disp: , Rfl:  .  losartan (COZAAR) 50 MG tablet, Take 50 mg by mouth daily., Disp: , Rfl:  .  Multiple Minerals (CALCIUM/MAGNESIUM/ZINC PO), Take 1 tablet by mouth daily., Disp: , Rfl:  .  Multiple Vitamins-Minerals ([employer] SPECTRAVITE SENIOR PO), Take 1 tablet by mouth daily. , Disp: , Rfl:  .  NIFEdipine (ADALAT CC) 90 MG 24 hr tablet, Take 90 mg by mouth daily., Disp: , Rfl:  .  ondansetron (ZOFRAN) 4 MG tablet, Take 4 mg by mouth 3 (three) times daily as needed., Disp: , Rfl: 0 .  pantoprazole (PROTONIX) 20 MG tablet,  Take 1 tablet (20 mg total) by mouth daily., Disp: 30 tablet, Rfl: 0 .  pantoprazole (PROTONIX) 40 MG tablet, Take 40 mg by mouth daily., Disp: , Rfl: 3 .  PAZEO 0.7 % SOLN, INSTILL 1 DROP INTO BOTH EYES EVERY DAY, Disp: , Rfl:  .  penicillin v potassium (VEETID) 500 MG tablet, , Disp: , Rfl:  .  pravastatin (PRAVACHOL) 20 MG tablet, Take 20 mg by mouth daily., Disp: , Rfl:  .  sitaGLIPtin-metformin (JANUMET) 50-1000 MG tablet, Take 1 tablet by mouth daily. , Disp: , Rfl:  .  sucralfate (CARAFATE) 1 GM/10ML suspension, Take 10 mLs (1 g total) by mouth 4 (four) times daily -  with meals and at bedtime., Disp: 420 mL, Rfl: 0  No Known Allergies  Objective:  Vascular Examination: Capillary refill time immediate x 10 digits.  Dorsalis pedis and Posterior tibial pulses palpable b/l.  Digital hair absent x 10 digits.  Skin temperature gradient WNL b/l.  Dermatological Examination: Skin with age-related atrophy.   Normal texture  b/l.   Toenails 1-5 b/l discolored, thick, dystrophic with subungual debris and pain with palpation to nailbeds due to thickness of nails.  No open wounds.  Musculoskeletal: Muscle strength 5/5 to all LE muscle groups  No gross bony deformities b/l.  No pain, crepitus or joint limitation  noted with ROM.   Neurological: Sensation intact with 10 gram monofilament.  Vibratory sensation intact.  Assessment: Painful onychomycosis toenails 1-5 b/l   Plan: 1. Toenails 1-5 b/l were debrided in length and girth without iatrogenic bleeding. 2. Patient to continue soft, supportive shoe gear 3. Patient to report any pedal injuries to medical professional immediately. 4. Follow up 3 months. Patient/POA to call should there be a concern in the interim.

## 2018-09-29 DIAGNOSIS — H9113 Presbycusis, bilateral: Secondary | ICD-10-CM | POA: Insufficient documentation

## 2018-09-29 DIAGNOSIS — H903 Sensorineural hearing loss, bilateral: Secondary | ICD-10-CM | POA: Diagnosis not present

## 2018-10-31 ENCOUNTER — Ambulatory Visit: Payer: MEDICARE | Admitting: Podiatry

## 2018-11-03 ENCOUNTER — Other Ambulatory Visit: Payer: Self-pay

## 2018-11-03 ENCOUNTER — Ambulatory Visit (INDEPENDENT_AMBULATORY_CARE_PROVIDER_SITE_OTHER): Payer: MEDICARE | Admitting: Podiatry

## 2018-11-03 ENCOUNTER — Encounter: Payer: Self-pay | Admitting: Podiatry

## 2018-11-03 VITALS — Temp 96.6°F

## 2018-11-03 DIAGNOSIS — M79676 Pain in unspecified toe(s): Secondary | ICD-10-CM

## 2018-11-03 DIAGNOSIS — E1142 Type 2 diabetes mellitus with diabetic polyneuropathy: Secondary | ICD-10-CM | POA: Diagnosis not present

## 2018-11-03 DIAGNOSIS — B351 Tinea unguium: Secondary | ICD-10-CM | POA: Diagnosis not present

## 2018-11-03 DIAGNOSIS — E119 Type 2 diabetes mellitus without complications: Secondary | ICD-10-CM | POA: Diagnosis not present

## 2018-11-03 NOTE — Patient Instructions (Signed)

## 2018-11-13 NOTE — Progress Notes (Signed)
Subjective: Robin Mcclure presents today with painful, thick toenails 1-5 b/l that she cannot cut and which interfere with daily activities.  Pain is aggravated when wearing enclosed shoe gear.  [name], [name], MD is her PCP.    Current Outpatient Medications:  .  acetaminophen (TYLENOL) 325 MG tablet, Take 325 mg by mouth every 6 (six) hours as needed for mild pain., Disp: , Rfl:  .  albuterol (PROVENTIL HFA;VENTOLIN HFA) 108 (90 Base) MCG/ACT inhaler, Inhale 1-2 puffs into the lungs every 6 (six) hours as needed for wheezing or shortness of breath., Disp: , Rfl:  .  aspirin 81 MG EC tablet, Take 81 mg by mouth every Monday, Wednesday, and Friday. Swallow whole. , Disp: , Rfl:  .  B Complex Vitamins (B COMPLEX PO), Take 1 tablet by mouth daily., Disp: , Rfl:  .  Cholecalciferol (D3-1000) 1000 units tablet, Take 1,000 Units by mouth daily. , Disp: , Rfl:  .  CRANBERRY CONCENTRATE PO, Take 25,000 Units by mouth daily. , Disp: , Rfl:  .  denosumab (PROLIA) 60 MG/ML SOLN injection, Inject 60 mg into the skin every 6 (six) months. Administer in upper arm, thigh, or abdomen, Disp: , Rfl:  .  Fluticasone-Salmeterol (ADVAIR DISKUS) 500-50 MCG/DOSE AEPB, Inhale 1 puff into the lungs 2 (two) times daily., Disp: , Rfl:  .  furosemide (LASIX) 40 MG tablet, Take 40 mg by mouth daily. , Disp: , Rfl:  .  glipiZIDE (GLUCOTROL XL) 2.5 MG 24 hr tablet, Take 2.5 mg by mouth daily with breakfast., Disp: , Rfl:  .  losartan (COZAAR) 25 MG tablet, Take 50 mg by mouth daily., Disp: , Rfl:  .  losartan (COZAAR) 50 MG tablet, Take 50 mg by mouth daily., Disp: , Rfl:  .  Multiple Minerals (CALCIUM/MAGNESIUM/ZINC PO), Take 1 tablet by mouth daily., Disp: , Rfl:  .  Multiple Vitamins-Minerals ([employer] SPECTRAVITE SENIOR PO), Take 1 tablet by mouth daily. , Disp: , Rfl:  .  NIFEdipine (ADALAT CC) 90 MG 24 hr tablet, Take 90 mg by mouth daily., Disp: , Rfl:  .  ondansetron (ZOFRAN) 4 MG tablet, Take 4 mg by mouth 3 (three)  times daily as needed., Disp: , Rfl: 0 .  pantoprazole (PROTONIX) 20 MG tablet, Take 1 tablet (20 mg total) by mouth daily., Disp: 30 tablet, Rfl: 0 .  pantoprazole (PROTONIX) 40 MG tablet, Take 40 mg by mouth daily., Disp: , Rfl: 3 .  PAZEO 0.7 % SOLN, INSTILL 1 DROP INTO BOTH EYES EVERY DAY, Disp: , Rfl:  .  penicillin v potassium (VEETID) 500 MG tablet, , Disp: , Rfl:  .  pravastatin (PRAVACHOL) 20 MG tablet, Take 20 mg by mouth daily., Disp: , Rfl:  .  sitaGLIPtin-metformin (JANUMET) 50-1000 MG tablet, Take 1 tablet by mouth daily. , Disp: , Rfl:  .  sucralfate (CARAFATE) 1 GM/10ML suspension, Take 10 mLs (1 g total) by mouth 4 (four) times daily -  with meals and at bedtime., Disp: 420 mL, Rfl: 0  No Known Allergies  Objective: Vitals:   11/03/18 1417  Temp: (!) 96.6 F (35.9 C)    Vascular Examination: Capillary refill time immediate x 10 digits.  Dorsalis pedis and Posterior tibial pulses palpable b/l  Digital hair absent x 10 digits.  Skin temperature gradient WNL b/l.  +2 pitting edema BLE. No pain on calf compression.   Dermatological Examination: Skin with age related atrophy b/l.  Toenails 1-5 b/l discolored, thick, dystrophic with subungual debris and  pain with palpation to nailbeds due to thickness of nails.  Musculoskeletal: Muscle strength 5/5 to all LE muscle groups  No gross bony deformities b/l.  No pain, crepitus or joint limitation noted with ROM.   Neurological: Sensation intact with 10 gram monofilament.  Vibratory sensation diminished b/l.  Assessment: Painful onychomycosis toenails 1-5 b/l  NIDDM  Plan: 1. Toenails 1-5 b/l were debrided in length and girth without iatrogenic bleeding. 2. Patient to continue soft, supportive shoe gear daily. 3. Patient to report any pedal injuries to medical professional immediately. 4. Follow up 3 months.  5. Patient/POA to call should there be a concern in the interim.

## 2018-12-23 DIAGNOSIS — E113293 Type 2 diabetes mellitus with mild nonproliferative diabetic retinopathy without macular edema, bilateral: Secondary | ICD-10-CM | POA: Diagnosis not present

## 2018-12-23 DIAGNOSIS — H1013 Acute atopic conjunctivitis, bilateral: Secondary | ICD-10-CM | POA: Diagnosis not present

## 2018-12-23 DIAGNOSIS — H353132 Nonexudative age-related macular degeneration, bilateral, intermediate dry stage: Secondary | ICD-10-CM | POA: Diagnosis not present

## 2019-01-07 DIAGNOSIS — M810 Age-related osteoporosis without current pathological fracture: Secondary | ICD-10-CM | POA: Diagnosis not present

## 2019-02-04 ENCOUNTER — Other Ambulatory Visit: Payer: Self-pay

## 2019-02-04 ENCOUNTER — Ambulatory Visit (INDEPENDENT_AMBULATORY_CARE_PROVIDER_SITE_OTHER): Payer: MEDICARE | Admitting: Podiatry

## 2019-02-04 ENCOUNTER — Encounter: Payer: Self-pay | Admitting: Podiatry

## 2019-02-04 DIAGNOSIS — B351 Tinea unguium: Secondary | ICD-10-CM | POA: Diagnosis not present

## 2019-02-04 DIAGNOSIS — M79676 Pain in unspecified toe(s): Secondary | ICD-10-CM | POA: Diagnosis not present

## 2019-02-04 DIAGNOSIS — L84 Corns and callosities: Secondary | ICD-10-CM

## 2019-02-04 DIAGNOSIS — E119 Type 2 diabetes mellitus without complications: Secondary | ICD-10-CM | POA: Diagnosis not present

## 2019-02-04 NOTE — Patient Instructions (Signed)

## 2019-02-09 DIAGNOSIS — S40811A Abrasion of right upper arm, initial encounter: Secondary | ICD-10-CM | POA: Diagnosis not present

## 2019-02-09 DIAGNOSIS — M25561 Pain in right knee: Secondary | ICD-10-CM | POA: Diagnosis not present

## 2019-02-09 DIAGNOSIS — M1711 Unilateral primary osteoarthritis, right knee: Secondary | ICD-10-CM | POA: Diagnosis not present

## 2019-02-10 DIAGNOSIS — Z09 Encounter for follow-up examination after completed treatment for conditions other than malignant neoplasm: Secondary | ICD-10-CM | POA: Diagnosis not present

## 2019-02-10 DIAGNOSIS — Z23 Encounter for immunization: Secondary | ICD-10-CM | POA: Diagnosis not present

## 2019-02-12 NOTE — Progress Notes (Signed)
Subjective: Robin Mcclure is a 85 y.o. y.o. female who presents today for diabetic foot care with cc of painful, discolored, thick toenails and calluses which interfere with daily activities. Pain is aggravated when wearing enclosed shoe gear and relieved with periodic professional debridement.  [name], [name], MD is her PCP.   Current Outpatient Medications on File Prior to Visit  Medication Sig Dispense Refill  . acetaminophen (TYLENOL) 325 MG tablet Take 325 mg by mouth every 6 (six) hours as needed for mild pain.    Marland Kitchen albuterol (PROVENTIL HFA;VENTOLIN HFA) 108 (90 Base) MCG/ACT inhaler Inhale 1-2 puffs into the lungs every 6 (six) hours as needed for wheezing or shortness of breath.    Marland Kitchen aspirin 81 MG EC tablet Take 81 mg by mouth every Monday, Wednesday, and Friday. Swallow whole.     . B Complex Vitamins (B COMPLEX PO) Take 1 tablet by mouth daily.    . Cholecalciferol (D3-1000) 1000 units tablet Take 1,000 Units by mouth daily.     Marland Kitchen CRANBERRY CONCENTRATE PO Take 25,000 Units by mouth daily.     Marland Kitchen denosumab (PROLIA) 60 MG/ML SOLN injection Inject 60 mg into the skin every 6 (six) months. Administer in upper arm, thigh, or abdomen    . Fluticasone-Salmeterol (ADVAIR DISKUS) 500-50 MCG/DOSE AEPB Inhale 1 puff into the lungs 2 (two) times daily.    . furosemide (LASIX) 40 MG tablet Take 40 mg by mouth daily.     Marland Kitchen glipiZIDE (GLUCOTROL XL) 2.5 MG 24 hr tablet Take 2.5 mg by mouth daily with breakfast.    . losartan (COZAAR) 25 MG tablet Take 50 mg by mouth daily.    Marland Kitchen losartan (COZAAR) 50 MG tablet Take 50 mg by mouth daily.    . Multiple Minerals (CALCIUM/MAGNESIUM/ZINC PO) Take 1 tablet by mouth daily.    . Multiple Vitamins-Minerals ([employer] SPECTRAVITE SENIOR PO) Take 1 tablet by mouth daily.     Marland Kitchen NIFEdipine (ADALAT CC) 90 MG 24 hr tablet Take 90 mg by mouth daily.    . ondansetron (ZOFRAN) 4 MG tablet Take 4 mg by mouth 3 (three) times daily as needed.  0  . pantoprazole (PROTONIX) 20 MG  tablet Take 1 tablet (20 mg total) by mouth daily. 30 tablet 0  . pantoprazole (PROTONIX) 40 MG tablet Take 40 mg by mouth daily.  3  . PAZEO 0.7 % SOLN INSTILL 1 DROP INTO BOTH EYES EVERY DAY    . penicillin v potassium (VEETID) 500 MG tablet     . pravastatin (PRAVACHOL) 20 MG tablet Take 20 mg by mouth daily.    . sitaGLIPtin-metformin (JANUMET) 50-1000 MG tablet Take 1 tablet by mouth daily.     . sucralfate (CARAFATE) 1 GM/10ML suspension Take 10 mLs (1 g total) by mouth 4 (four) times daily -  with meals and at bedtime. 420 mL 0   No current facility-administered medications on file prior to visit.     No Known Allergies  Objective: 85 y.o. female, elderly, in NAD. AAO x 3.   Vascular Examination: Capillary refill time immediate x 10 digits.  Dorsalis pedis pulses palpable b/l.  Posterior tibial pulses palpable b/l.  Digital hair absent x 10 digits.  Skin temperature gradient WNL b/l.  Dermatological Examination: Skin with normal turgor, texture and tone b/l.  Toenails 1-5 b/l discolored, thick, dystrophic with subungual debris and pain with palpation to nailbeds due to thickness of nails.  Hyperkeratotic lesion submet head 1 b/l and b/l hallux.  No erythema, no edema, no drainage, no flocculence noted.   Musculoskeletal: Muscle strength 5/5 to all LE muscle groups b/l.  Neurological: Sensation intact 5/5 b/l with 10 gram monofilament.  Vibratory sensation diminished b/l.  Assessment: 1. Painful onychomycosis toenails 1-5 b/l 2.  Calluses submet head 1 b/l 3.  NIDDM  Plan: 1. Continue diabetic foot care principles. Literature dispensed on today. 2. Toenails 1-5 b/l were debrided in length and girth without iatrogenic bleeding. 3. Hyperkeratotic lesion(s) submet head 1 b/l and b/l hallux pared with sterile scalpel blade without incident. 4.  Patient to continue soft, supportive shoe gear daily. 5. Patient to report any pedal injuries to medical professional  immediately. 6. Follow up 3 months.  7. Patient/POA to call should there be a concern in the interim.

## 2019-03-04 DIAGNOSIS — E7800 Pure hypercholesterolemia, unspecified: Secondary | ICD-10-CM | POA: Diagnosis not present

## 2019-03-04 DIAGNOSIS — Z79899 Other long term (current) drug therapy: Secondary | ICD-10-CM | POA: Diagnosis not present

## 2019-03-04 DIAGNOSIS — E113293 Type 2 diabetes mellitus with mild nonproliferative diabetic retinopathy without macular edema, bilateral: Secondary | ICD-10-CM | POA: Diagnosis not present

## 2019-03-04 DIAGNOSIS — Z7984 Long term (current) use of oral hypoglycemic drugs: Secondary | ICD-10-CM | POA: Diagnosis not present

## 2019-03-10 DIAGNOSIS — J449 Chronic obstructive pulmonary disease, unspecified: Secondary | ICD-10-CM | POA: Diagnosis not present

## 2019-03-10 DIAGNOSIS — I10 Essential (primary) hypertension: Secondary | ICD-10-CM | POA: Diagnosis not present

## 2019-03-10 DIAGNOSIS — M810 Age-related osteoporosis without current pathological fracture: Secondary | ICD-10-CM | POA: Diagnosis not present

## 2019-03-10 DIAGNOSIS — E113293 Type 2 diabetes mellitus with mild nonproliferative diabetic retinopathy without macular edema, bilateral: Secondary | ICD-10-CM | POA: Diagnosis not present

## 2019-03-10 DIAGNOSIS — E7800 Pure hypercholesterolemia, unspecified: Secondary | ICD-10-CM | POA: Diagnosis not present

## 2019-03-10 DIAGNOSIS — Z7984 Long term (current) use of oral hypoglycemic drugs: Secondary | ICD-10-CM | POA: Diagnosis not present

## 2019-05-06 ENCOUNTER — Ambulatory Visit: Payer: MEDICARE | Admitting: Podiatry

## 2019-06-22 DIAGNOSIS — E113293 Type 2 diabetes mellitus with mild nonproliferative diabetic retinopathy without macular edema, bilateral: Secondary | ICD-10-CM | POA: Diagnosis not present

## 2019-06-22 DIAGNOSIS — I340 Nonrheumatic mitral (valve) insufficiency: Secondary | ICD-10-CM | POA: Diagnosis not present

## 2019-06-22 DIAGNOSIS — Z7984 Long term (current) use of oral hypoglycemic drugs: Secondary | ICD-10-CM | POA: Diagnosis not present

## 2019-06-22 DIAGNOSIS — J449 Chronic obstructive pulmonary disease, unspecified: Secondary | ICD-10-CM | POA: Diagnosis not present

## 2019-06-22 DIAGNOSIS — E7800 Pure hypercholesterolemia, unspecified: Secondary | ICD-10-CM | POA: Diagnosis not present

## 2019-06-22 DIAGNOSIS — E119 Type 2 diabetes mellitus without complications: Secondary | ICD-10-CM | POA: Diagnosis not present

## 2019-06-22 DIAGNOSIS — Z79899 Other long term (current) drug therapy: Secondary | ICD-10-CM | POA: Diagnosis not present

## 2019-06-22 DIAGNOSIS — Z0001 Encounter for general adult medical examination with abnormal findings: Secondary | ICD-10-CM | POA: Diagnosis not present

## 2019-06-22 DIAGNOSIS — M810 Age-related osteoporosis without current pathological fracture: Secondary | ICD-10-CM | POA: Diagnosis not present

## 2019-06-22 DIAGNOSIS — I10 Essential (primary) hypertension: Secondary | ICD-10-CM | POA: Diagnosis not present

## 2019-06-26 DIAGNOSIS — E113293 Type 2 diabetes mellitus with mild nonproliferative diabetic retinopathy without macular edema, bilateral: Secondary | ICD-10-CM | POA: Diagnosis not present

## 2019-07-20 ENCOUNTER — Encounter: Payer: Self-pay | Admitting: Podiatry

## 2019-07-20 ENCOUNTER — Ambulatory Visit (INDEPENDENT_AMBULATORY_CARE_PROVIDER_SITE_OTHER): Payer: MEDICARE | Admitting: Podiatry

## 2019-07-20 ENCOUNTER — Other Ambulatory Visit: Payer: Self-pay

## 2019-07-20 VITALS — Temp 97.1°F

## 2019-07-20 DIAGNOSIS — B351 Tinea unguium: Secondary | ICD-10-CM | POA: Diagnosis not present

## 2019-07-20 DIAGNOSIS — E1142 Type 2 diabetes mellitus with diabetic polyneuropathy: Secondary | ICD-10-CM | POA: Diagnosis not present

## 2019-07-20 DIAGNOSIS — L84 Corns and callosities: Secondary | ICD-10-CM | POA: Diagnosis not present

## 2019-07-20 DIAGNOSIS — M79676 Pain in unspecified toe(s): Secondary | ICD-10-CM

## 2019-07-20 NOTE — Patient Instructions (Signed)
Diabetes Mellitus and Foot Care Foot care is an important part of your health, especially when you have diabetes. Diabetes may cause you to have problems because of poor blood flow (circulation) to your feet and legs, which can cause your skin to:  Become thinner and drier.  Break more easily.  Heal more slowly.  Peel and crack. You may also have nerve damage (neuropathy) in your legs and feet, causing decreased feeling in them. This means that you may not notice minor injuries to your feet that could lead to more serious problems. Noticing and addressing any potential problems early is the best way to prevent future foot problems. How to care for your feet Foot hygiene  Wash your feet daily with warm water and mild soap. Do not use hot water. Then, pat your feet and the areas between your toes until they are completely dry. Do not soak your feet as this can dry your skin.  Trim your toenails straight across. Do not dig under them or around the cuticle. File the edges of your nails with an emery board or nail file.  Apply a moisturizing lotion or petroleum jelly to the skin on your feet and to dry, brittle toenails. Use lotion that does not contain alcohol and is unscented. Do not apply lotion between your toes. Shoes and socks  Wear clean socks or stockings every day. Make sure they are not too tight. Do not wear knee-high stockings since they may decrease blood flow to your legs.  Wear shoes that fit properly and have enough cushioning. Always look in your shoes before you put them on to be sure there are no objects inside.  To break in new shoes, wear them for just a few hours a day. This prevents injuries on your feet. Wounds, scrapes, corns, and calluses  Check your feet daily for blisters, cuts, bruises, sores, and redness. If you cannot see the bottom of your feet, use a mirror or ask someone for help.  Do not cut corns or calluses or try to remove them with medicine.  If you  find a minor scrape, cut, or break in the skin on your feet, keep it and the skin around it clean and dry. You may clean these areas with mild soap and water. Do not clean the area with peroxide, alcohol, or iodine.  If you have a wound, scrape, corn, or callus on your foot, look at it several times a day to make sure it is healing and not infected. Check for: ? Redness, swelling, or pain. ? Fluid or blood. ? Warmth. ? Pus or a bad smell. General instructions  Do not cross your legs. This may decrease blood flow to your feet.  Do not use heating pads or hot water bottles on your feet. They may burn your skin. If you have lost feeling in your feet or legs, you may not know this is happening until it is too late.  Protect your feet from hot and cold by wearing shoes, such as at the beach or on hot pavement.  Schedule a complete foot exam at least once a year (annually) or more often if you have foot problems. If you have foot problems, report any cuts, sores, or bruises to your health care provider immediately. Contact a health care provider if:  You have a medical condition that increases your risk of infection and you have any cuts, sores, or bruises on your feet.  You have an injury that is not   healing.  You have redness on your legs or feet.  You feel burning or tingling in your legs or feet.  You have pain or cramps in your legs and feet.  Your legs or feet are numb.  Your feet always feel cold.  You have pain around a toenail. Get help right away if:  You have a wound, scrape, corn, or callus on your foot and: ? You have pain, swelling, or redness that gets worse. ? You have fluid or blood coming from the wound, scrape, corn, or callus. ? Your wound, scrape, corn, or callus feels warm to the touch. ? You have pus or a bad smell coming from the wound, scrape, corn, or callus. ? You have a fever. ? You have a red line going up your leg. Summary  Check your feet every day  for cuts, sores, red spots, swelling, and blisters.  Moisturize feet and legs daily.  Wear shoes that fit properly and have enough cushioning.  If you have foot problems, report any cuts, sores, or bruises to your health care provider immediately.  Schedule a complete foot exam at least once a year (annually) or more often if you have foot problems. This information is not intended to replace advice given to you by your health care provider. Make sure you discuss any questions you have with your health care provider. Document Revised: 01/28/2019 Document Reviewed: 06/08/2016 Elsevier Patient Education  2020 [employer]  

## 2019-07-23 NOTE — Progress Notes (Signed)
Subjective: Robin Mcclure presents today for follow up of preventative diabetic foot care and callus(es) plantar aspect of both feet and painful mycotic toenails b/l that are difficult to trim. Pain interferes with ambulation. Aggravating factors include wearing enclosed shoe gear. Pain is relieved with periodic professional debridement..   No Known Allergies   Objective: Vitals:   07/20/19 1457  Temp: (!) 97.1 F (36.2 C)    Vascular Examination:  Capillary refill time to digits immediate b/l. Palpable DP pulses b/l. Palpable PT pulses b/l. Pedal hair absent b/l Skin temperature gradient within normal limits b/l.  Dermatological Examination: Pedal skin with normal turgor, texture and tone bilaterally. No open wounds bilaterally. No interdigital macerations bilaterally. Toenails 1-5 b/l elongated, dystrophic, thickened, crumbly with subungual debris and tenderness to dorsal palpation. Hyperkeratotic lesion(s) submet head 1 b/l.  No erythema, no edema, no drainage, no flocculence.  Musculoskeletal: Normal muscle strength 5/5 to all lower extremity muscle groups bilaterally, no pain crepitus or joint limitation noted with ROM b/l, bunion deformity noted b/l and utilizes wheelchair for mobility assistance  Neurological: Protective sensation intact 5/5 intact bilaterally with 10g monofilament b/l and vibratory sensation absent b/l  Assessment: 1. Pain due to onychomycosis of toenail   2. Callus   3. Diabetic peripheral neuropathy associated with type 2 diabetes mellitus ([place])    Plan: -Continue diabetic foot care principles. Literature dispensed on today.  -Toenails 1-5 b/l were debrided in length and girth with sterile nail nippers and dremel without iatrogenic bleeding.  -Calluses were debrided without complication or incident. Total number debrided =2, submet head 1 b/l. -Patient to continue soft, supportive shoe gear daily. -Patient to report any pedal injuries to medical  professional immediately. -Patient/POA to call should there be question/concern in the interim.  Return in about 3 months (around 10/20/2019) for diabetic nail trim.

## 2019-08-03 DIAGNOSIS — M810 Age-related osteoporosis without current pathological fracture: Secondary | ICD-10-CM | POA: Diagnosis not present

## 2019-09-29 DIAGNOSIS — S51812A Laceration without foreign body of left forearm, initial encounter: Secondary | ICD-10-CM | POA: Diagnosis not present

## 2019-09-29 DIAGNOSIS — S8002XA Contusion of left knee, initial encounter: Secondary | ICD-10-CM | POA: Diagnosis not present

## 2019-09-29 DIAGNOSIS — T148XXA Other injury of unspecified body region, initial encounter: Secondary | ICD-10-CM | POA: Diagnosis not present

## 2019-09-29 DIAGNOSIS — S8992XA Unspecified injury of left lower leg, initial encounter: Secondary | ICD-10-CM | POA: Diagnosis not present

## 2019-09-29 DIAGNOSIS — W19XXXA Unspecified fall, initial encounter: Secondary | ICD-10-CM | POA: Diagnosis not present

## 2019-10-20 ENCOUNTER — Encounter: Payer: Self-pay | Admitting: Podiatry

## 2019-10-20 ENCOUNTER — Ambulatory Visit (INDEPENDENT_AMBULATORY_CARE_PROVIDER_SITE_OTHER): Payer: MEDICARE | Admitting: Podiatry

## 2019-10-20 ENCOUNTER — Other Ambulatory Visit: Payer: Self-pay

## 2019-10-20 DIAGNOSIS — M79676 Pain in unspecified toe(s): Secondary | ICD-10-CM | POA: Diagnosis not present

## 2019-10-20 DIAGNOSIS — B351 Tinea unguium: Secondary | ICD-10-CM

## 2019-10-20 DIAGNOSIS — L84 Corns and callosities: Secondary | ICD-10-CM

## 2019-10-20 DIAGNOSIS — E1142 Type 2 diabetes mellitus with diabetic polyneuropathy: Secondary | ICD-10-CM

## 2019-10-20 NOTE — Patient Instructions (Signed)
Diabetes Mellitus and Foot Care Foot care is an important part of your health, especially when you have diabetes. Diabetes may cause you to have problems because of poor blood flow (circulation) to your feet and legs, which can cause your skin to:  Become thinner and drier.  Break more easily.  Heal more slowly.  Peel and crack. You may also have nerve damage (neuropathy) in your legs and feet, causing decreased feeling in them. This means that you may not notice minor injuries to your feet that could lead to more serious problems. Noticing and addressing any potential problems early is the best way to prevent future foot problems. How to care for your feet Foot hygiene  Wash your feet daily with warm water and mild soap. Do not use hot water. Then, pat your feet and the areas between your toes until they are completely dry. Do not soak your feet as this can dry your skin.  Trim your toenails straight across. Do not dig under them or around the cuticle. File the edges of your nails with an emery board or nail file.  Apply a moisturizing lotion or petroleum jelly to the skin on your feet and to dry, brittle toenails. Use lotion that does not contain alcohol and is unscented. Do not apply lotion between your toes. Shoes and socks  Wear clean socks or stockings every day. Make sure they are not too tight. Do not wear knee-high stockings since they may decrease blood flow to your legs.  Wear shoes that fit properly and have enough cushioning. Always look in your shoes before you put them on to be sure there are no objects inside.  To break in new shoes, wear them for just a few hours a day. This prevents injuries on your feet. Wounds, scrapes, corns, and calluses  Check your feet daily for blisters, cuts, bruises, sores, and redness. If you cannot see the bottom of your feet, use a mirror or ask someone for help.  Do not cut corns or calluses or try to remove them with medicine.  If you  find a minor scrape, cut, or break in the skin on your feet, keep it and the skin around it clean and dry. You may clean these areas with mild soap and water. Do not clean the area with peroxide, alcohol, or iodine.  If you have a wound, scrape, corn, or callus on your foot, look at it several times a day to make sure it is healing and not infected. Check for: ? Redness, swelling, or pain. ? Fluid or blood. ? Warmth. ? Pus or a bad smell. General instructions  Do not cross your legs. This may decrease blood flow to your feet.  Do not use heating pads or hot water bottles on your feet. They may burn your skin. If you have lost feeling in your feet or legs, you may not know this is happening until it is too late.  Protect your feet from hot and cold by wearing shoes, such as at the beach or on hot pavement.  Schedule a complete foot exam at least once a year (annually) or more often if you have foot problems. If you have foot problems, report any cuts, sores, or bruises to your health care provider immediately. Contact a health care provider if:  You have a medical condition that increases your risk of infection and you have any cuts, sores, or bruises on your feet.  You have an injury that is not   healing.  You have redness on your legs or feet.  You feel burning or tingling in your legs or feet.  You have pain or cramps in your legs and feet.  Your legs or feet are numb.  Your feet always feel cold.  You have pain around a toenail. Get help right away if:  You have a wound, scrape, corn, or callus on your foot and: ? You have pain, swelling, or redness that gets worse. ? You have fluid or blood coming from the wound, scrape, corn, or callus. ? Your wound, scrape, corn, or callus feels warm to the touch. ? You have pus or a bad smell coming from the wound, scrape, corn, or callus. ? You have a fever. ? You have a red line going up your leg. Summary  Check your feet every day  for cuts, sores, red spots, swelling, and blisters.  Moisturize feet and legs daily.  Wear shoes that fit properly and have enough cushioning.  If you have foot problems, report any cuts, sores, or bruises to your health care provider immediately.  Schedule a complete foot exam at least once a year (annually) or more often if you have foot problems. This information is not intended to replace advice given to you by your health care provider. Make sure you discuss any questions you have with your health care provider. Document Revised: 01/28/2019 Document Reviewed: 06/08/2016 Elsevier Patient Education  2020 [employer]  

## 2019-10-21 DIAGNOSIS — S81002A Unspecified open wound, left knee, initial encounter: Secondary | ICD-10-CM | POA: Diagnosis not present

## 2019-10-21 DIAGNOSIS — L03116 Cellulitis of left lower limb: Secondary | ICD-10-CM | POA: Diagnosis not present

## 2019-10-23 DIAGNOSIS — S81002D Unspecified open wound, left knee, subsequent encounter: Secondary | ICD-10-CM | POA: Diagnosis not present

## 2019-10-25 NOTE — Progress Notes (Signed)
Subjective: Robin Mcclure presents today preventative diabetic foot care and painful callus(es) b/l great toes and painful mycotic toenails b/l that are difficult to trim. Pain interferes with ambulation. Aggravating factors include wearing enclosed shoe gear. Pain is relieved with periodic professional debridement.   She voices no new pedal problems on today's visit.  [name], [name], MD is patient's PCP. Last visit was: 06/26/2019.  Past Medical History:  Diagnosis Date  . Diabetes mellitus without complication ([place])   . Hyperlipidemia   . Hypertension      Current Outpatient Medications on File Prior to Visit  Medication Sig Dispense Refill  . mupirocin cream (BACTROBAN) 2 % Apply to affected area 3 times daily    . acetaminophen (TYLENOL) 325 MG tablet Take 325 mg by mouth every 6 (six) hours as needed for mild pain.    Marland Kitchen albuterol (PROVENTIL HFA;VENTOLIN HFA) 108 (90 Base) MCG/ACT inhaler Inhale 1-2 puffs into the lungs every 6 (six) hours as needed for wheezing or shortness of breath.    Marland Kitchen aspirin 81 MG EC tablet Take 81 mg by mouth every Monday, Wednesday, and Friday. Swallow whole.     . B Complex Vitamins (B COMPLEX PO) Take 1 tablet by mouth daily.    . Cholecalciferol (D3-1000) 1000 units tablet Take 1,000 Units by mouth daily.     Marland Kitchen CRANBERRY CONCENTRATE PO Take 25,000 Units by mouth daily.     Marland Kitchen denosumab (PROLIA) 60 MG/ML SOLN injection Inject 60 mg into the skin every 6 (six) months. Administer in upper arm, thigh, or abdomen    . Fluticasone-Salmeterol (ADVAIR DISKUS) 500-50 MCG/DOSE AEPB Inhale 1 puff into the lungs 2 (two) times daily.    . furosemide (LASIX) 40 MG tablet Take 40 mg by mouth daily.     Marland Kitchen glipiZIDE (GLUCOTROL XL) 2.5 MG 24 hr tablet Take 2.5 mg by mouth daily with breakfast.    . losartan (COZAAR) 25 MG tablet Take 50 mg by mouth daily.    Marland Kitchen losartan (COZAAR) 50 MG tablet Take 50 mg by mouth daily.    . Multiple Minerals (CALCIUM/MAGNESIUM/ZINC PO) Take 1  tablet by mouth daily.    . Multiple Vitamins-Minerals ([employer] SPECTRAVITE SENIOR PO) Take 1 tablet by mouth daily.     Marland Kitchen NIFEdipine (ADALAT CC) 90 MG 24 hr tablet Take 90 mg by mouth daily.    . ondansetron (ZOFRAN) 4 MG tablet Take 4 mg by mouth 3 (three) times daily as needed.  0  . pantoprazole (PROTONIX) 20 MG tablet Take 1 tablet (20 mg total) by mouth daily. 30 tablet 0  . pantoprazole (PROTONIX) 40 MG tablet Take 40 mg by mouth daily.  3  . PAZEO 0.7 % SOLN INSTILL 1 DROP INTO BOTH EYES EVERY DAY    . penicillin v potassium (VEETID) 500 MG tablet     . pravastatin (PRAVACHOL) 20 MG tablet Take 20 mg by mouth daily.    . sitaGLIPtin-metformin (JANUMET) 50-1000 MG tablet Take 1 tablet by mouth daily.     . sucralfate (CARAFATE) 1 GM/10ML suspension Take 10 mLs (1 g total) by mouth 4 (four) times daily -  with meals and at bedtime. 420 mL 0   No current facility-administered medications on file prior to visit.     No Known Allergies  Objective: Robin Mcclure is a pleasant 86 y.o. y.o. Patient Race: White or Caucasian [1]  female in NAD. AAO x 3.  There were no vitals filed for this visit.  Vascular  Examination: Neurovascular status unchanged b/l lower extremities. Capillary refill time to digits immediate b/l. Palpable DP pulses b/l. Palpable PT pulses b/l. Pedal hair absent b/l. Skin temperature gradient within normal limits b/l.  Dermatological Examination: Pedal skin with normal turgor, texture and tone bilaterally. No open wounds bilaterally. No interdigital macerations bilaterally. Toenails 1-5 b/l elongated, discolored, dystrophic, thickened, crumbly with subungual debris and tenderness to dorsal palpation. Hyperkeratotic lesion(s) L hallux and R hallux.  No erythema, no edema, no drainage, no flocculence.  Musculoskeletal: Normal muscle strength 5/5 to all lower extremity muscle groups bilaterally. No pain crepitus or joint limitation noted with ROM b/l. Hallux valgus with  bunion deformity noted b/l lower extremities. Utilizes wheelchair for mobility assistance.  Neurological Examination: Protective sensation intact 5/5 intact bilaterally with 10g monofilament b/l. Vibratory sensation diminished b/l.  Assessment: 1. Pain due to onychomycosis of toenail   2. Callus   3. Diabetic peripheral neuropathy associated with type 2 diabetes mellitus ([place])   Plan: -Examined patient. -No new findings. No new orders. -Toenails 1-5 b/l were debrided in length and girth with sterile nail nippers and dremel without iatrogenic bleeding.  -Callus(es) L hallux and R hallux pared utilizing sterile scalpel blade without complication or incident. Total number debrided =2. -Patient to continue soft, supportive shoe gear daily. -Patient to report any pedal injuries to medical professional immediately. -Patient/POA to call should there be question/concern in the interim.  Return in about 3 months (around 01/20/2020) for diabetic nail and callus trim.  Marzetta Board, DPM

## 2019-12-21 DIAGNOSIS — I340 Nonrheumatic mitral (valve) insufficiency: Secondary | ICD-10-CM | POA: Diagnosis not present

## 2019-12-21 DIAGNOSIS — E113293 Type 2 diabetes mellitus with mild nonproliferative diabetic retinopathy without macular edema, bilateral: Secondary | ICD-10-CM | POA: Diagnosis not present

## 2019-12-21 DIAGNOSIS — J449 Chronic obstructive pulmonary disease, unspecified: Secondary | ICD-10-CM | POA: Diagnosis not present

## 2019-12-21 DIAGNOSIS — E7800 Pure hypercholesterolemia, unspecified: Secondary | ICD-10-CM | POA: Diagnosis not present

## 2019-12-21 DIAGNOSIS — Z79899 Other long term (current) drug therapy: Secondary | ICD-10-CM | POA: Diagnosis not present

## 2019-12-21 DIAGNOSIS — I10 Essential (primary) hypertension: Secondary | ICD-10-CM | POA: Diagnosis not present

## 2019-12-21 DIAGNOSIS — M810 Age-related osteoporosis without current pathological fracture: Secondary | ICD-10-CM | POA: Diagnosis not present

## 2019-12-24 ENCOUNTER — Other Ambulatory Visit: Payer: Self-pay | Admitting: Family Medicine

## 2019-12-24 DIAGNOSIS — M810 Age-related osteoporosis without current pathological fracture: Secondary | ICD-10-CM

## 2019-12-25 DIAGNOSIS — E119 Type 2 diabetes mellitus without complications: Secondary | ICD-10-CM | POA: Diagnosis not present

## 2019-12-25 DIAGNOSIS — Z961 Presence of intraocular lens: Secondary | ICD-10-CM | POA: Diagnosis not present

## 2019-12-25 DIAGNOSIS — H353132 Nonexudative age-related macular degeneration, bilateral, intermediate dry stage: Secondary | ICD-10-CM | POA: Diagnosis not present

## 2020-02-01 ENCOUNTER — Other Ambulatory Visit: Payer: Self-pay

## 2020-02-01 ENCOUNTER — Ambulatory Visit (INDEPENDENT_AMBULATORY_CARE_PROVIDER_SITE_OTHER): Payer: MEDICARE | Admitting: Podiatry

## 2020-02-01 ENCOUNTER — Encounter: Payer: Self-pay | Admitting: Podiatry

## 2020-02-01 DIAGNOSIS — M79676 Pain in unspecified toe(s): Secondary | ICD-10-CM | POA: Diagnosis not present

## 2020-02-01 DIAGNOSIS — L84 Corns and callosities: Secondary | ICD-10-CM

## 2020-02-01 DIAGNOSIS — B351 Tinea unguium: Secondary | ICD-10-CM

## 2020-02-01 DIAGNOSIS — E1142 Type 2 diabetes mellitus with diabetic polyneuropathy: Secondary | ICD-10-CM

## 2020-02-02 NOTE — Progress Notes (Addendum)
Subjective: Robin Mcclure presents today preventative diabetic foot care and painful callus(es) b/l great toes and painful mycotic toenails b/l that are difficult to trim. Pain interferes with ambulation. Aggravating factors include wearing enclosed shoe gear. Pain is relieved with periodic professional debridement.   She voices no new pedal problems on today's visit. She is accompanied by her daughter on today's visit.  [name], [name], MD is patient's PCP. Last visit was: 08/03/2019  Past Medical History:  Diagnosis Date  . Diabetes mellitus without complication ([place])   . Hyperlipidemia   . Hypertension      Current Outpatient Medications on File Prior to Visit  Medication Sig Dispense Refill  . acetaminophen (TYLENOL) 325 MG tablet Take 325 mg by mouth every 6 (six) hours as needed for mild pain.    Marland Kitchen albuterol (PROVENTIL HFA;VENTOLIN HFA) 108 (90 Base) MCG/ACT inhaler Inhale 1-2 puffs into the lungs every 6 (six) hours as needed for wheezing or shortness of breath.    Marland Kitchen aspirin 81 MG EC tablet Take 81 mg by mouth every Monday, Wednesday, and Friday. Swallow whole.     . B Complex Vitamins (B COMPLEX PO) Take 1 tablet by mouth daily.    . cephALEXin (KEFLEX) 500 MG capsule Take 500 mg by mouth 3 (three) times daily.    . Cholecalciferol (D3-1000) 1000 units tablet Take 1,000 Units by mouth daily.     Marland Kitchen CRANBERRY CONCENTRATE PO Take 25,000 Units by mouth daily.     Marland Kitchen denosumab (PROLIA) 60 MG/ML SOLN injection Inject 60 mg into the skin every 6 (six) months. Administer in upper arm, thigh, or abdomen    . Fluticasone-Salmeterol (ADVAIR DISKUS) 500-50 MCG/DOSE AEPB Inhale 1 puff into the lungs 2 (two) times daily.    . furosemide (LASIX) 40 MG tablet Take 40 mg by mouth daily.     Marland Kitchen glipiZIDE (GLUCOTROL XL) 2.5 MG 24 hr tablet Take 2.5 mg by mouth daily with breakfast.    . losartan (COZAAR) 25 MG tablet Take 50 mg by mouth daily.    Marland Kitchen losartan (COZAAR) 50 MG tablet Take 50 mg by mouth  daily.    . Multiple Minerals (CALCIUM/MAGNESIUM/ZINC PO) Take 1 tablet by mouth daily.    . Multiple Vitamins-Minerals ([employer] SPECTRAVITE SENIOR PO) Take 1 tablet by mouth daily.     . mupirocin cream (BACTROBAN) 2 % Apply to affected area 3 times daily    . NIFEdipine (ADALAT CC) 90 MG 24 hr tablet Take 90 mg by mouth daily.    . ondansetron (ZOFRAN) 4 MG tablet Take 4 mg by mouth 3 (three) times daily as needed.  0  . pantoprazole (PROTONIX) 20 MG tablet Take 1 tablet (20 mg total) by mouth daily. 30 tablet 0  . pantoprazole (PROTONIX) 40 MG tablet Take 40 mg by mouth daily.  3  . PAZEO 0.7 % SOLN INSTILL 1 DROP INTO BOTH EYES EVERY DAY    . penicillin v potassium (VEETID) 500 MG tablet     . pravastatin (PRAVACHOL) 20 MG tablet Take 20 mg by mouth daily.    . sitaGLIPtin-metformin (JANUMET) 50-1000 MG tablet Take 1 tablet by mouth daily.     . sucralfate (CARAFATE) 1 GM/10ML suspension Take 10 mLs (1 g total) by mouth 4 (four) times daily -  with meals and at bedtime. 420 mL 0   No current facility-administered medications on file prior to visit.     No Known Allergies  Objective: Robin Mcclure is a pleasant  86 y.o. y.o. Patient Race: White or Caucasian [1]  female in NAD. AAO x 3.  There were no vitals filed for this visit.  Vascular Examination: Neurovascular status unchanged b/l lower extremities. Capillary refill time to digits immediate b/l. Palpable DP pulses b/l. Palpable PT pulses b/l. Pedal hair absent b/l. Skin temperature gradient within normal limits b/l.  Dermatological Examination: Pedal skin with normal turgor, texture and tone bilaterally. No open wounds bilaterally. No interdigital macerations bilaterally. Toenails 1-5 b/l elongated, discolored, dystrophic, thickened, crumbly with subungual debris and tenderness to dorsal palpation. Hyperkeratotic lesion(s) L hallux and R hallux.  No erythema, no edema, no drainage, no flocculence.  Musculoskeletal: Normal muscle  strength 5/5 to all lower extremity muscle groups bilaterally. No pain crepitus or joint limitation noted with ROM b/l. Hallux valgus with bunion deformity noted b/l lower extremities. Utilizes wheelchair for mobility assistance.  Neurological Examination: Protective sensation intact 5/5 intact bilaterally with 10g monofilament b/l. Vibratory sensation diminished b/l.  Assessment: 1. Pain due to onychomycosis of toenail   2. Callus   3. Diabetic peripheral neuropathy associated with type 2 diabetes mellitus ([place])     Plan: -Examined patient. -No new findings. No new orders. -Toenails 1-5 b/l were debrided in length and girth with sterile nail nippers and dremel without iatrogenic bleeding.  -Callus(es) L hallux and R hallux pared utilizing sterile scalpel blade without complication or incident. Total number debrided =2. -Patient to continue soft, supportive shoe gear daily. -Patient to report any pedal injuries to medical professional immediately. -Patient/POA to call should there be question/concern in the interim.  Return in about 3 months (around 05/02/2020).  Marzetta Board, DPM

## 2020-03-25 ENCOUNTER — Ambulatory Visit
Admission: RE | Admit: 2020-03-25 | Discharge: 2020-03-25 | Disposition: A | Discharge status: Home / Self Care | Payer: MEDICARE | Source: Ambulatory Visit | Attending: Family Medicine | Admitting: Family Medicine

## 2020-03-25 ENCOUNTER — Other Ambulatory Visit: Payer: Self-pay

## 2020-03-25 DIAGNOSIS — Z780 Asymptomatic menopausal state: Secondary | ICD-10-CM | POA: Diagnosis not present

## 2020-03-25 DIAGNOSIS — M810 Age-related osteoporosis without current pathological fracture: Secondary | ICD-10-CM

## 2020-03-25 DIAGNOSIS — M8589 Other specified disorders of bone density and structure, multiple sites: Secondary | ICD-10-CM | POA: Diagnosis not present

## 2020-03-25 DIAGNOSIS — Z23 Encounter for immunization: Secondary | ICD-10-CM | POA: Diagnosis not present

## 2020-04-18 DIAGNOSIS — R059 Cough, unspecified: Secondary | ICD-10-CM | POA: Diagnosis not present

## 2020-04-18 DIAGNOSIS — E119 Type 2 diabetes mellitus without complications: Secondary | ICD-10-CM | POA: Diagnosis not present

## 2020-04-18 DIAGNOSIS — J449 Chronic obstructive pulmonary disease, unspecified: Secondary | ICD-10-CM | POA: Diagnosis not present

## 2020-04-19 ENCOUNTER — Other Ambulatory Visit: Payer: Self-pay | Admitting: Family Medicine

## 2020-04-19 ENCOUNTER — Ambulatory Visit
Admission: RE | Admit: 2020-04-19 | Discharge: 2020-04-19 | Disposition: A | Discharge status: Home / Self Care | Payer: MEDICARE | Source: Ambulatory Visit | Attending: Family Medicine | Admitting: Family Medicine

## 2020-04-19 DIAGNOSIS — R059 Cough, unspecified: Secondary | ICD-10-CM

## 2020-05-03 ENCOUNTER — Ambulatory Visit (INDEPENDENT_AMBULATORY_CARE_PROVIDER_SITE_OTHER): Payer: MEDICARE | Admitting: Podiatry

## 2020-05-03 ENCOUNTER — Other Ambulatory Visit: Payer: Self-pay

## 2020-05-03 DIAGNOSIS — E1142 Type 2 diabetes mellitus with diabetic polyneuropathy: Secondary | ICD-10-CM

## 2020-05-03 DIAGNOSIS — M79676 Pain in unspecified toe(s): Secondary | ICD-10-CM | POA: Diagnosis not present

## 2020-05-03 DIAGNOSIS — L84 Corns and callosities: Secondary | ICD-10-CM | POA: Diagnosis not present

## 2020-05-03 DIAGNOSIS — B351 Tinea unguium: Secondary | ICD-10-CM | POA: Diagnosis not present

## 2020-05-03 NOTE — Progress Notes (Signed)
Subjective: Robin Mcclure presents today preventative diabetic foot care and painful callus(es) b/l great toes and painful mycotic toenails b/l that are difficult to trim. Pain interferes with ambulation. Aggravating factors include wearing enclosed shoe gear. Pain is relieved with periodic professional debridement.   She voices no new pedal problems on today's visit. She is accompanied by her daughter on today's visit.  [name], [name], MD is patient's PCP. Last visit was: 12/21/2019.  Past Medical History:  Diagnosis Date  . Diabetes mellitus without complication ([place])   . Hyperlipidemia   . Hypertension      Current Outpatient Medications on File Prior to Visit  Medication Sig Dispense Refill  . acetaminophen (TYLENOL) 325 MG tablet Take 325 mg by mouth every 6 (six) hours as needed for mild pain.    Marland Kitchen albuterol (PROVENTIL HFA;VENTOLIN HFA) 108 (90 Base) MCG/ACT inhaler Inhale 1-2 puffs into the lungs every 6 (six) hours as needed for wheezing or shortness of breath.    Marland Kitchen aspirin 81 MG EC tablet Take 81 mg by mouth every Monday, Wednesday, and Friday. Swallow whole.     . B Complex Vitamins (B COMPLEX PO) Take 1 tablet by mouth daily.    . cephALEXin (KEFLEX) 500 MG capsule Take 500 mg by mouth 3 (three) times daily.    . Cholecalciferol (D3-1000) 1000 units tablet Take 1,000 Units by mouth daily.     Marland Kitchen CRANBERRY CONCENTRATE PO Take 25,000 Units by mouth daily.     Marland Kitchen denosumab (PROLIA) 60 MG/ML SOLN injection Inject 60 mg into the skin every 6 (six) months. Administer in upper arm, thigh, or abdomen    . Fluticasone-Salmeterol (ADVAIR DISKUS) 500-50 MCG/DOSE AEPB Inhale 1 puff into the lungs 2 (two) times daily.    . furosemide (LASIX) 40 MG tablet Take 40 mg by mouth daily.     Marland Kitchen glipiZIDE (GLUCOTROL XL) 2.5 MG 24 hr tablet Take 2.5 mg by mouth daily with breakfast.    . losartan (COZAAR) 25 MG tablet Take 50 mg by mouth daily.    Marland Kitchen losartan (COZAAR) 50 MG tablet Take 50 mg by mouth  daily.    . Multiple Minerals (CALCIUM/MAGNESIUM/ZINC PO) Take 1 tablet by mouth daily.    . Multiple Vitamins-Minerals ([employer] SPECTRAVITE SENIOR PO) Take 1 tablet by mouth daily.     Marland Kitchen NIFEdipine (ADALAT CC) 90 MG 24 hr tablet Take 90 mg by mouth daily.    . ondansetron (ZOFRAN) 4 MG tablet Take 4 mg by mouth 3 (three) times daily as needed.  0  . pantoprazole (PROTONIX) 20 MG tablet Take 1 tablet (20 mg total) by mouth daily. 30 tablet 0  . pantoprazole (PROTONIX) 40 MG tablet Take 40 mg by mouth daily.  3  . PAZEO 0.7 % SOLN INSTILL 1 DROP INTO BOTH EYES EVERY DAY    . penicillin v potassium (VEETID) 500 MG tablet     . pravastatin (PRAVACHOL) 20 MG tablet Take 20 mg by mouth daily.    . sitaGLIPtin-metformin (JANUMET) 50-1000 MG tablet Take 1 tablet by mouth daily.     . sucralfate (CARAFATE) 1 GM/10ML suspension Take 10 mLs (1 g total) by mouth 4 (four) times daily -  with meals and at bedtime. 420 mL 0   No current facility-administered medications on file prior to visit.     No Known Allergies  Objective: Robin Mcclure is a pleasant 86 y.o. y.o. Patient Race: White or Caucasian [1]  female in NAD. AAO x 3.  There were no vitals filed for this visit.  Vascular Examination: Neurovascular status unchanged b/l lower extremities. Capillary refill time to digits immediate b/l. Palpable DP pulses b/l. Palpable PT pulses b/l. Pedal hair absent b/l. Skin temperature gradient within normal limits b/l.  Dermatological Examination: Pedal skin with normal turgor, texture and tone bilaterally. No open wounds bilaterally. No interdigital macerations bilaterally. Toenails 1-5 b/l elongated, discolored, dystrophic, thickened, crumbly with subungual debris and tenderness to dorsal palpation. Hyperkeratotic lesion(s) L hallux and R hallux.  No erythema, no edema, no drainage, no flocculence.  Musculoskeletal: Normal muscle strength 5/5 to all lower extremity muscle groups bilaterally. No pain  crepitus or joint limitation noted with ROM b/l. Hallux valgus with bunion deformity noted b/l lower extremities. Utilizes wheelchair for mobility assistance.  Neurological Examination: Protective sensation intact 5/5 intact bilaterally with 10g monofilament b/l. Vibratory sensation diminished b/l.  Assessment: 1. Pain due to onychomycosis of toenail   2. Callus   3. Diabetic peripheral neuropathy associated with type 2 diabetes mellitus ([place])     Plan: -Examined patient. -No new findings. No new orders. -Toenails 1-5 b/l were debrided in length and girth with sterile nail nippers and dremel without iatrogenic bleeding.  -Callus(es) L hallux and R hallux pared utilizing sterile scalpel blade without complication or incident. Total number debrided =2. -Patient to continue soft, supportive shoe gear daily. -Patient to report any pedal injuries to medical professional immediately. -Patient/POA to call should there be question/concern in the interim.  Return in about 3 months (around 08/01/2020) for diabetic toenails, corn(s)/callus(es).  Marzetta Board, DPM

## 2020-05-06 DIAGNOSIS — R5383 Other fatigue: Secondary | ICD-10-CM | POA: Diagnosis not present

## 2020-05-06 DIAGNOSIS — E119 Type 2 diabetes mellitus without complications: Secondary | ICD-10-CM | POA: Diagnosis not present

## 2020-05-06 DIAGNOSIS — I10 Essential (primary) hypertension: Secondary | ICD-10-CM | POA: Diagnosis not present

## 2020-05-06 DIAGNOSIS — E7800 Pure hypercholesterolemia, unspecified: Secondary | ICD-10-CM | POA: Diagnosis not present

## 2020-05-06 DIAGNOSIS — J449 Chronic obstructive pulmonary disease, unspecified: Secondary | ICD-10-CM | POA: Diagnosis not present

## 2020-05-08 ENCOUNTER — Encounter: Payer: Self-pay | Admitting: Podiatry

## 2020-05-24 ENCOUNTER — Other Ambulatory Visit: Payer: Self-pay | Admitting: Family Medicine

## 2020-05-24 DIAGNOSIS — R053 Chronic cough: Secondary | ICD-10-CM

## 2020-05-26 DIAGNOSIS — K2210 Ulcer of esophagus without bleeding: Secondary | ICD-10-CM | POA: Diagnosis not present

## 2020-05-26 DIAGNOSIS — R059 Cough, unspecified: Secondary | ICD-10-CM | POA: Diagnosis not present

## 2020-05-26 DIAGNOSIS — J449 Chronic obstructive pulmonary disease, unspecified: Secondary | ICD-10-CM | POA: Diagnosis not present

## 2020-05-27 ENCOUNTER — Other Ambulatory Visit: Payer: Self-pay | Admitting: Family Medicine

## 2020-05-27 DIAGNOSIS — R059 Cough, unspecified: Secondary | ICD-10-CM

## 2020-06-01 ENCOUNTER — Other Ambulatory Visit: Payer: MEDICARE

## 2020-06-14 ENCOUNTER — Other Ambulatory Visit: Payer: Self-pay

## 2020-06-14 ENCOUNTER — Ambulatory Visit
Admission: RE | Admit: 2020-06-14 | Discharge: 2020-06-14 | Disposition: A | Discharge status: Home / Self Care | Payer: MEDICARE | Source: Ambulatory Visit | Attending: Family Medicine | Admitting: Family Medicine

## 2020-06-14 DIAGNOSIS — J90 Pleural effusion, not elsewhere classified: Secondary | ICD-10-CM | POA: Diagnosis not present

## 2020-06-14 DIAGNOSIS — I313 Pericardial effusion (noninflammatory): Secondary | ICD-10-CM | POA: Diagnosis not present

## 2020-06-14 DIAGNOSIS — J9811 Atelectasis: Secondary | ICD-10-CM | POA: Diagnosis not present

## 2020-06-14 DIAGNOSIS — R059 Cough, unspecified: Secondary | ICD-10-CM

## 2020-06-14 DIAGNOSIS — R5383 Other fatigue: Secondary | ICD-10-CM | POA: Diagnosis not present

## 2020-06-20 ENCOUNTER — Inpatient Hospital Stay (HOSPITAL_COMMUNITY)
Admission: EM | Admit: 2020-06-20 | Discharge: 2020-06-24 | DRG: 314 | Disposition: A | Discharge status: Home Health | Payer: MEDICARE | Attending: Family Medicine | Admitting: Family Medicine

## 2020-06-20 ENCOUNTER — Emergency Department (HOSPITAL_COMMUNITY): Payer: MEDICARE

## 2020-06-20 ENCOUNTER — Encounter (HOSPITAL_COMMUNITY): Payer: Self-pay | Admitting: Emergency Medicine

## 2020-06-20 DIAGNOSIS — E876 Hypokalemia: Secondary | ICD-10-CM | POA: Diagnosis present

## 2020-06-20 DIAGNOSIS — Z9049 Acquired absence of other specified parts of digestive tract: Secondary | ICD-10-CM | POA: Diagnosis not present

## 2020-06-20 DIAGNOSIS — R0602 Shortness of breath: Secondary | ICD-10-CM | POA: Diagnosis not present

## 2020-06-20 DIAGNOSIS — J9811 Atelectasis: Secondary | ICD-10-CM | POA: Diagnosis present

## 2020-06-20 DIAGNOSIS — I517 Cardiomegaly: Secondary | ICD-10-CM | POA: Diagnosis not present

## 2020-06-20 DIAGNOSIS — I301 Infective pericarditis: Secondary | ICD-10-CM | POA: Diagnosis not present

## 2020-06-20 DIAGNOSIS — K219 Gastro-esophageal reflux disease without esophagitis: Secondary | ICD-10-CM | POA: Diagnosis present

## 2020-06-20 DIAGNOSIS — R0902 Hypoxemia: Secondary | ICD-10-CM | POA: Diagnosis not present

## 2020-06-20 DIAGNOSIS — I248 Other forms of acute ischemic heart disease: Secondary | ICD-10-CM | POA: Diagnosis present

## 2020-06-20 DIAGNOSIS — I300 Acute nonspecific idiopathic pericarditis: Secondary | ICD-10-CM | POA: Diagnosis not present

## 2020-06-20 DIAGNOSIS — E1142 Type 2 diabetes mellitus with diabetic polyneuropathy: Secondary | ICD-10-CM | POA: Diagnosis present

## 2020-06-20 DIAGNOSIS — I313 Pericardial effusion (noninflammatory): Secondary | ICD-10-CM

## 2020-06-20 DIAGNOSIS — I319 Disease of pericardium, unspecified: Secondary | ICD-10-CM

## 2020-06-20 DIAGNOSIS — R8281 Pyuria: Secondary | ICD-10-CM | POA: Diagnosis present

## 2020-06-20 DIAGNOSIS — J9601 Acute respiratory failure with hypoxia: Secondary | ICD-10-CM | POA: Diagnosis present

## 2020-06-20 DIAGNOSIS — Z9221 Personal history of antineoplastic chemotherapy: Secondary | ICD-10-CM

## 2020-06-20 DIAGNOSIS — Z87891 Personal history of nicotine dependence: Secondary | ICD-10-CM

## 2020-06-20 DIAGNOSIS — I440 Atrioventricular block, first degree: Secondary | ICD-10-CM | POA: Diagnosis present

## 2020-06-20 DIAGNOSIS — I130 Hypertensive heart and chronic kidney disease with heart failure and stage 1 through stage 4 chronic kidney disease, or unspecified chronic kidney disease: Secondary | ICD-10-CM | POA: Diagnosis present

## 2020-06-20 DIAGNOSIS — M810 Age-related osteoporosis without current pathological fracture: Secondary | ICD-10-CM | POA: Diagnosis present

## 2020-06-20 DIAGNOSIS — Z85038 Personal history of other malignant neoplasm of large intestine: Secondary | ICD-10-CM | POA: Diagnosis not present

## 2020-06-20 DIAGNOSIS — I5031 Acute diastolic (congestive) heart failure: Secondary | ICD-10-CM | POA: Diagnosis present

## 2020-06-20 DIAGNOSIS — R8271 Bacteriuria: Secondary | ICD-10-CM | POA: Diagnosis present

## 2020-06-20 DIAGNOSIS — Z20822 Contact with and (suspected) exposure to covid-19: Secondary | ICD-10-CM | POA: Diagnosis present

## 2020-06-20 DIAGNOSIS — Z79899 Other long term (current) drug therapy: Secondary | ICD-10-CM

## 2020-06-20 DIAGNOSIS — I3139 Other pericardial effusion (noninflammatory): Secondary | ICD-10-CM | POA: Insufficient documentation

## 2020-06-20 DIAGNOSIS — I2510 Atherosclerotic heart disease of native coronary artery without angina pectoris: Secondary | ICD-10-CM | POA: Diagnosis present

## 2020-06-20 DIAGNOSIS — R001 Bradycardia, unspecified: Secondary | ICD-10-CM | POA: Diagnosis not present

## 2020-06-20 DIAGNOSIS — Z7951 Long term (current) use of inhaled steroids: Secondary | ICD-10-CM

## 2020-06-20 DIAGNOSIS — Z7982 Long term (current) use of aspirin: Secondary | ICD-10-CM | POA: Diagnosis not present

## 2020-06-20 DIAGNOSIS — Z7984 Long term (current) use of oral hypoglycemic drugs: Secondary | ICD-10-CM

## 2020-06-20 DIAGNOSIS — N1832 Chronic kidney disease, stage 3b: Secondary | ICD-10-CM | POA: Diagnosis present

## 2020-06-20 DIAGNOSIS — R0689 Other abnormalities of breathing: Secondary | ICD-10-CM | POA: Diagnosis not present

## 2020-06-20 DIAGNOSIS — E785 Hyperlipidemia, unspecified: Secondary | ICD-10-CM | POA: Diagnosis present

## 2020-06-20 DIAGNOSIS — Z90710 Acquired absence of both cervix and uterus: Secondary | ICD-10-CM

## 2020-06-20 DIAGNOSIS — J9600 Acute respiratory failure, unspecified whether with hypoxia or hypercapnia: Secondary | ICD-10-CM

## 2020-06-20 DIAGNOSIS — N179 Acute kidney failure, unspecified: Secondary | ICD-10-CM | POA: Diagnosis present

## 2020-06-20 DIAGNOSIS — I10 Essential (primary) hypertension: Secondary | ICD-10-CM | POA: Diagnosis not present

## 2020-06-20 DIAGNOSIS — J9602 Acute respiratory failure with hypercapnia: Secondary | ICD-10-CM | POA: Diagnosis not present

## 2020-06-20 LAB — ECHOCARDIOGRAM COMPLETE
AR max vel: 2.34 cm2
AV Peak grad: 12.7 mmHg
Ao pk vel: 1.78 m/s
Area-P 1/2: 10.25 cm2
S' Lateral: 2.4 cm

## 2020-06-20 LAB — CBC WITH DIFFERENTIAL/PLATELET
Abs Immature Granulocytes: 0.08 10*3/uL — ABNORMAL HIGH (ref 0.00–0.07)
Basophils Absolute: 0.1 10*3/uL (ref 0.0–0.1)
Basophils Relative: 1 %
Eosinophils Absolute: 0 10*3/uL (ref 0.0–0.5)
Eosinophils Relative: 0 %
HCT: 34 % — ABNORMAL LOW (ref 36.0–46.0)
Hemoglobin: 10.3 g/dL — ABNORMAL LOW (ref 12.0–15.0)
Immature Granulocytes: 1 %
Lymphocytes Relative: 12 %
Lymphs Abs: 1.1 10*3/uL (ref 0.7–4.0)
MCH: 27.1 pg (ref 26.0–34.0)
MCHC: 30.3 g/dL (ref 30.0–36.0)
MCV: 89.5 fL (ref 80.0–100.0)
Monocytes Absolute: 1 10*3/uL (ref 0.1–1.0)
Monocytes Relative: 10 %
Neutro Abs: 7.1 10*3/uL (ref 1.7–7.7)
Neutrophils Relative %: 76 %
Platelets: 400 10*3/uL (ref 150–400)
RBC: 3.8 MIL/uL — ABNORMAL LOW (ref 3.87–5.11)
RDW: 15.2 % (ref 11.5–15.5)
WBC: 9.3 10*3/uL (ref 4.0–10.5)
nRBC: 0 % (ref 0.0–0.2)

## 2020-06-20 LAB — COMPREHENSIVE METABOLIC PANEL
ALT: 18 U/L (ref 0–44)
AST: 18 U/L (ref 15–41)
Albumin: 2.5 g/dL — ABNORMAL LOW (ref 3.5–5.0)
Alkaline Phosphatase: 83 U/L (ref 38–126)
Anion gap: 13 (ref 5–15)
BUN: 12 mg/dL (ref 8–23)
CO2: 23 mmol/L (ref 22–32)
Calcium: 10.1 mg/dL (ref 8.9–10.3)
Chloride: 103 mmol/L (ref 98–111)
Creatinine, Ser: 0.69 mg/dL (ref 0.44–1.00)
GFR, Estimated: >60 mL/min (ref >60)
Glucose, Bld: 174 mg/dL — ABNORMAL HIGH (ref 70–99)
Potassium: 2.7 mmol/L — CL (ref 3.5–5.1)
Sodium: 139 mmol/L (ref 135–145)
Total Bilirubin: 0.9 mg/dL (ref 0.3–1.2)
Total Protein: 6.5 g/dL (ref 6.5–8.1)

## 2020-06-20 LAB — TROPONIN I (HIGH SENSITIVITY)
Troponin I (High Sensitivity): 44 ng/L — ABNORMAL HIGH (ref <18)
Troponin I (High Sensitivity): 49 ng/L — ABNORMAL HIGH (ref <18)

## 2020-06-20 LAB — URINALYSIS, ROUTINE W REFLEX MICROSCOPIC
Bilirubin Urine: NEGATIVE
Glucose, UA: NEGATIVE mg/dL
Hgb urine dipstick: NEGATIVE
Ketones, ur: NEGATIVE mg/dL
Nitrite: POSITIVE — AB
Protein, ur: NEGATIVE mg/dL
Specific Gravity, Urine: 1.033 — ABNORMAL HIGH (ref 1.005–1.030)
pH: 5 (ref 5.0–8.0)

## 2020-06-20 LAB — GLUCOSE, CAPILLARY: Glucose-Capillary: 189 mg/dL — ABNORMAL HIGH (ref 70–99)

## 2020-06-20 LAB — PHOSPHORUS: Phosphorus: 2.9 mg/dL (ref 2.5–4.6)

## 2020-06-20 LAB — C-REACTIVE PROTEIN: CRP: 10.2 mg/dL — ABNORMAL HIGH (ref <1.0)

## 2020-06-20 LAB — SEDIMENTATION RATE: Sed Rate: 100 mm/hr — ABNORMAL HIGH (ref 0–22)

## 2020-06-20 LAB — HEMOGLOBIN A1C
Hgb A1c MFr Bld: 7.3 % — ABNORMAL HIGH (ref 4.8–5.6)
Mean Plasma Glucose: 162.81 mg/dL

## 2020-06-20 LAB — BRAIN NATRIURETIC PEPTIDE: B Natriuretic Peptide: 277 pg/mL — ABNORMAL HIGH (ref 0.0–100.0)

## 2020-06-20 LAB — SARS CORONAVIRUS 2 BY RT PCR (HOSPITAL ORDER, PERFORMED IN ~~LOC~~ HOSPITAL LAB): SARS Coronavirus 2: NEGATIVE

## 2020-06-20 LAB — CBG MONITORING, ED: Glucose-Capillary: 148 mg/dL — ABNORMAL HIGH (ref 70–99)

## 2020-06-20 LAB — MAGNESIUM: Magnesium: 1.6 mg/dL — ABNORMAL LOW (ref 1.7–2.4)

## 2020-06-20 MED ORDER — IOHEXOL 350 MG/ML SOLN
75.0000 mL | Freq: Once | INTRAVENOUS | Status: AC | PRN
  Administered 2020-06-20: 75 mL via INTRAVENOUS

## 2020-06-20 MED ORDER — LINAGLIPTIN 5 MG PO TABS
5.0000 mg | ORAL_TABLET | Freq: Every day | ORAL | Status: DC
  Administered 2020-06-20 – 2020-06-24 (×5): 5 mg via ORAL
  Filled 2020-06-20 (×5): qty 1

## 2020-06-20 MED ORDER — SODIUM CHLORIDE 0.9% FLUSH: 3.0000 mL | INTRAVENOUS | Status: DC | PRN

## 2020-06-20 MED ORDER — PRAVASTATIN SODIUM 10 MG PO TABS
20.0000 mg | ORAL_TABLET | Freq: Every day | ORAL | Status: DC
  Administered 2020-06-20 – 2020-06-24 (×5): 20 mg via ORAL
  Filled 2020-06-20 (×5): qty 2

## 2020-06-20 MED ORDER — INSULIN ASPART 100 UNIT/ML ~~LOC~~ SOLN
0.0000 [IU] | Freq: Three times a day (TID) | SUBCUTANEOUS | Status: DC
  Administered 2020-06-20: 1 [IU] via SUBCUTANEOUS
  Administered 2020-06-21 (×3): 2 [IU] via SUBCUTANEOUS
  Administered 2020-06-22: 1 [IU] via SUBCUTANEOUS
  Administered 2020-06-22 – 2020-06-24 (×7): 2 [IU] via SUBCUTANEOUS

## 2020-06-20 MED ORDER — SODIUM CHLORIDE 0.9% FLUSH
3.0000 mL | Freq: Two times a day (BID) | INTRAVENOUS | Status: DC
  Administered 2020-06-20 – 2020-06-24 (×8): 3 mL via INTRAVENOUS

## 2020-06-20 MED ORDER — GLIPIZIDE ER 2.5 MG PO TB24: 2.5000 mg | ORAL_TABLET | Freq: Every day | ORAL | Status: DC

## 2020-06-20 MED ORDER — NIFEDIPINE ER OSMOTIC RELEASE 90 MG PO TB24
90.0000 mg | ORAL_TABLET | Freq: Every day | ORAL | Status: DC
  Administered 2020-06-20 – 2020-06-24 (×5): 90 mg via ORAL
  Filled 2020-06-20 (×5): qty 1

## 2020-06-20 MED ORDER — METFORMIN HCL 500 MG PO TABS: 1000.0000 mg | ORAL_TABLET | Freq: Every day | ORAL | Status: DC

## 2020-06-20 MED ORDER — PROSIGHT PO TABS
1.0000 | ORAL_TABLET | Freq: Every day | ORAL | Status: DC
  Administered 2020-06-20 – 2020-06-24 (×5): 1 via ORAL
  Filled 2020-06-20 (×5): qty 1

## 2020-06-20 MED ORDER — ENOXAPARIN SODIUM 30 MG/0.3ML ~~LOC~~ SOLN
30.0000 mg | SUBCUTANEOUS | Status: DC
  Administered 2020-06-20 – 2020-06-21 (×2): 30 mg via SUBCUTANEOUS
  Filled 2020-06-20 (×2): qty 0.3

## 2020-06-20 MED ORDER — POTASSIUM CHLORIDE 10 MEQ/100ML IV SOLN
10.0000 meq | INTRAVENOUS | Status: AC
  Administered 2020-06-20 (×3): 10 meq via INTRAVENOUS
  Filled 2020-06-20 (×3): qty 100

## 2020-06-20 MED ORDER — CRANBERRY 500 MG PO CAPS: ORAL_CAPSULE | Freq: Every day | ORAL | Status: DC

## 2020-06-20 MED ORDER — ASPIRIN EC 81 MG PO TBEC: 81.0000 mg | DELAYED_RELEASE_TABLET | Freq: Every day | ORAL | Status: DC

## 2020-06-20 MED ORDER — ACETAMINOPHEN 325 MG PO TABS: 325.0000 mg | ORAL_TABLET | Freq: Four times a day (QID) | ORAL | Status: DC | PRN

## 2020-06-20 MED ORDER — PANTOPRAZOLE SODIUM 20 MG PO TBEC: 20.0000 mg | DELAYED_RELEASE_TABLET | Freq: Every day | ORAL | Status: DC

## 2020-06-20 MED ORDER — FLUTICASONE FUROATE-VILANTEROL 200-25 MCG/INH IN AEPB
1.0000 | INHALATION_SPRAY | Freq: Every day | RESPIRATORY_TRACT | Status: DC
  Administered 2020-06-22 – 2020-06-24 (×2): 1 via RESPIRATORY_TRACT
  Filled 2020-06-20 (×2): qty 28

## 2020-06-20 MED ORDER — ALBUTEROL SULFATE HFA 108 (90 BASE) MCG/ACT IN AERS
1.0000 | INHALATION_SPRAY | Freq: Four times a day (QID) | RESPIRATORY_TRACT | Status: DC | PRN
  Administered 2020-06-23 (×2): 1 via RESPIRATORY_TRACT
  Filled 2020-06-20: qty 6.7

## 2020-06-20 MED ORDER — FUROSEMIDE 10 MG/ML IJ SOLN
40.0000 mg | Freq: Every day | INTRAMUSCULAR | Status: DC
  Administered 2020-06-20 – 2020-06-21 (×2): 40 mg via INTRAVENOUS
  Filled 2020-06-20 (×2): qty 4

## 2020-06-20 MED ORDER — SUCRALFATE 1 GM/10ML PO SUSP
1.0000 g | Freq: Three times a day (TID) | ORAL | Status: DC
  Administered 2020-06-20 – 2020-06-24 (×16): 1 g via ORAL
  Filled 2020-06-20 (×18): qty 10

## 2020-06-20 MED ORDER — POTASSIUM CHLORIDE CRYS ER 20 MEQ PO TBCR
40.0000 meq | EXTENDED_RELEASE_TABLET | Freq: Once | ORAL | Status: AC
  Administered 2020-06-20: 40 meq via ORAL
  Filled 2020-06-20: qty 2

## 2020-06-20 MED ORDER — LABETALOL HCL 200 MG PO TABS
100.0000 mg | ORAL_TABLET | Freq: Four times a day (QID) | ORAL | Status: DC | PRN
Start: 2020-06-20 — End: 2020-06-23

## 2020-06-20 MED ORDER — SITAGLIPTIN PHOS-METFORMIN HCL 50-1000 MG PO TABS: 1.0000 | ORAL_TABLET | Freq: Every day | ORAL | Status: DC

## 2020-06-20 MED ORDER — SODIUM CHLORIDE 0.9 % IV SOLN: 250.0000 mL | INTRAVENOUS | Status: DC | PRN

## 2020-06-20 MED ORDER — COLCHICINE 0.6 MG PO TABS
0.6000 mg | ORAL_TABLET | Freq: Two times a day (BID) | ORAL | Status: DC
  Administered 2020-06-20 (×2): 0.6 mg via ORAL
  Filled 2020-06-20 (×2): qty 1

## 2020-06-20 MED ORDER — IBUPROFEN 600 MG PO TABS
800.0000 mg | ORAL_TABLET | Freq: Three times a day (TID) | ORAL | Status: DC
  Administered 2020-06-20 – 2020-06-23 (×11): 800 mg via ORAL
  Filled 2020-06-20 (×11): qty 1

## 2020-06-20 MED ORDER — LOSARTAN POTASSIUM 50 MG PO TABS: 50.0000 mg | ORAL_TABLET | Freq: Every day | ORAL | Status: DC

## 2020-06-20 MED ORDER — LOSARTAN POTASSIUM 50 MG PO TABS
100.0000 mg | ORAL_TABLET | Freq: Every day | ORAL | Status: DC
  Administered 2020-06-20: 100 mg via ORAL
  Filled 2020-06-20: qty 2

## 2020-06-20 MED ORDER — ONDANSETRON HCL 4 MG/2ML IJ SOLN: 4.0000 mg | Freq: Four times a day (QID) | INTRAMUSCULAR | Status: DC | PRN

## 2020-06-20 MED ORDER — FUROSEMIDE 10 MG/ML IJ SOLN: 40.0000 mg | Freq: Two times a day (BID) | INTRAMUSCULAR | Status: DC

## 2020-06-20 MED ORDER — ACETAMINOPHEN 325 MG PO TABS: 650.0000 mg | ORAL_TABLET | ORAL | Status: DC | PRN

## 2020-06-20 MED ORDER — PANTOPRAZOLE SODIUM 40 MG PO TBEC
40.0000 mg | DELAYED_RELEASE_TABLET | Freq: Every day | ORAL | Status: DC
  Administered 2020-06-21 – 2020-06-24 (×4): 40 mg via ORAL
  Filled 2020-06-20 (×4): qty 1

## 2020-06-20 NOTE — ED Provider Notes (Signed)
[facility] EMERGENCY DEPARTMENT Provider Note   CSN: [id number] Arrival date & time: 06/20/20  1015     History Chief Complaint  Patient presents with  . Pericardial Effusion    Robin Mcclure is a 87 y.o. female.  The history is provided by the patient.  Shortness of Breath Severity:  Moderate Onset quality:  Gradual Timing:  Constant Progression:  Worsening Chronicity:  New Context comment:  Per EMS fluid found on recent CT chest, patient with worsening respiratory symptoms and PCP told to come. No chest pain. Cough with some phelgm. Relieved by:  Nothing Worsened by:  Nothing Associated symptoms: cough and sputum production   Associated symptoms: no abdominal pain, no chest pain, no ear pain, no fever, no rash, no sore throat and no vomiting        Past Medical History:  Diagnosis Date  . Diabetes mellitus without complication ([place])   . Hyperlipidemia   . Hypertension     Patient Active Problem List   Diagnosis Date Noted  . Presbycusis of both ears 09/29/2018    Past Surgical History:  Procedure Laterality Date  . ABDOMINAL HYSTERECTOMY    . BIOPSY  01/19/2018   Procedure: BIOPSY;  Surgeon: Ronnette Juniper, MD;  Location: [place];  Service: Gastroenterology;;  . CHOLECYSTECTOMY    . ESOPHAGOGASTRODUODENOSCOPY (EGD) WITH PROPOFOL N/A 01/19/2018   Procedure: ESOPHAGOGASTRODUODENOSCOPY (EGD) WITH PROPOFOL;  Surgeon: Ronnette Juniper, MD;  Location: [place];  Service: Gastroenterology;  Laterality: N/A;  . HIP SURGERY       OB History   No obstetric history on file.     No family history on file.  Social History   Tobacco Use  . Smoking status: Never Smoker  . Smokeless tobacco: Never Used  Substance Use Topics  . Alcohol use: No  . Drug use: No    Home Medications Prior to Admission medications   Medication Sig Start Date End Date Taking? Authorizing Provider  acetaminophen (TYLENOL) 325 MG tablet Take 325 mg by mouth every 6  (six) hours as needed for mild pain.    [provider]  albuterol (PROVENTIL HFA;VENTOLIN HFA) 108 (90 Base) MCG/ACT inhaler Inhale 1-2 puffs into the lungs every 6 (six) hours as needed for wheezing or shortness of breath.    [provider]  aspirin 81 MG EC tablet Take 81 mg by mouth every Monday, Wednesday, and Friday. Swallow whole.     [provider]  B Complex Vitamins (B COMPLEX PO) Take 1 tablet by mouth daily.    [provider]  cephALEXin (KEFLEX) 500 MG capsule Take 500 mg by mouth 3 (three) times daily. 10/21/19   [provider]  Cholecalciferol (D3-1000) 1000 units tablet Take 1,000 Units by mouth daily.     [provider]  CRANBERRY CONCENTRATE PO Take 25,000 Units by mouth daily.     [provider]  denosumab (PROLIA) 60 MG/ML SOLN injection Inject 60 mg into the skin every 6 (six) months. Administer in upper arm, thigh, or abdomen    [provider]  Fluticasone-Salmeterol (ADVAIR DISKUS) 500-50 MCG/DOSE AEPB Inhale 1 puff into the lungs 2 (two) times daily.    [provider]  furosemide (LASIX) 40 MG tablet Take 40 mg by mouth daily.     [provider]  glipiZIDE (GLUCOTROL XL) 2.5 MG 24 hr tablet Take 2.5 mg by mouth daily with breakfast.    [provider]  losartan (COZAAR) 25 MG  tablet Take 50 mg by mouth daily. 09/30/18   [provider]  losartan (COZAAR) 50 MG tablet Take 50 mg by mouth daily.    [provider]  Multiple Minerals (CALCIUM/MAGNESIUM/ZINC PO) Take 1 tablet by mouth daily.    [provider]  Multiple Vitamins-Minerals ([employer] SPECTRAVITE SENIOR PO) Take 1 tablet by mouth daily.     [provider]  NIFEdipine (ADALAT CC) 90 MG 24 hr tablet Take 90 mg by mouth daily.    [provider]  ondansetron (ZOFRAN) 4 MG tablet Take 4 mg by mouth 3 (three) times daily as needed. 01/20/18   [provider]  pantoprazole  (PROTONIX) 20 MG tablet Take 1 tablet (20 mg total) by mouth daily. 01/19/18   Deno Etienne, DO  pantoprazole (PROTONIX) 40 MG tablet Take 40 mg by mouth daily. 03/10/18   [provider]  PAZEO 0.7 % SOLN INSTILL 1 DROP INTO BOTH EYES EVERY DAY 07/11/18   [provider]  penicillin v potassium (VEETID) 500 MG tablet  04/14/18   [provider]  pravastatin (PRAVACHOL) 20 MG tablet Take 20 mg by mouth daily.    [provider]  sitaGLIPtin-metformin (JANUMET) 50-1000 MG tablet Take 1 tablet by mouth daily.     [provider]  sucralfate (CARAFATE) 1 GM/10ML suspension Take 10 mLs (1 g total) by mouth 4 (four) times daily -  with meals and at bedtime. 01/19/18   Deno Etienne, DO    Allergies    Patient has no known allergies.  Review of Systems   Review of Systems  Constitutional: Negative for chills and fever.  HENT: Negative for ear pain and sore throat.   Eyes: Negative for pain and visual disturbance.  Respiratory: Positive for cough, sputum production and shortness of breath.   Cardiovascular: Negative for chest pain and palpitations.  Gastrointestinal: Negative for abdominal pain and vomiting.  Genitourinary: Negative for dysuria and hematuria.  Musculoskeletal: Negative for arthralgias and back pain.  Skin: Negative for color change and rash.  Neurological: Negative for seizures and syncope.  All other systems reviewed and are negative.   Physical Exam Updated Vital Signs  ED Triage Vitals  Enc Vitals Group     BP 06/20/20 1024 (!) 176/90     Pulse Rate 06/20/20 1024 90     Resp 06/20/20 1024 16     Temp 06/20/20 1024 98.9 F (37.2 C)     Temp src --      SpO2 06/20/20 1021 98 %     Weight --      Height --      Head Circumference --      Peak Flow --      Pain Score --      Pain Loc --      Pain Edu? --      Excl. in [place]? --     Physical Exam Vitals and nursing note reviewed.  Constitutional:      General: She is not in  acute distress.    Appearance: She is well-developed and well-nourished. She is not ill-appearing.  HENT:     Head: Normocephalic and atraumatic.     Mouth/Throat:     Mouth: Mucous membranes are dry.  Eyes:     Extraocular Movements: Extraocular movements intact.     Conjunctiva/sclera: Conjunctivae normal.     Pupils: Pupils are equal, round, and reactive to light.  Cardiovascular:     Rate and Rhythm:  Normal rate and regular rhythm.     Pulses: Normal pulses.     Heart sounds: No murmur heard.   Pulmonary:     Effort: No respiratory distress.     Comments: Slightly diminished throughout with poor effort and mild increased work of breathing  Abdominal:     Palpations: Abdomen is soft.     Tenderness: There is no abdominal tenderness.  Musculoskeletal:        General: No edema.     Cervical back: Neck supple.  Skin:    General: Skin is warm and dry.     Capillary Refill: Capillary refill takes less than 2 seconds.  Neurological:     General: No focal deficit present.     Mental Status: She is alert.  Psychiatric:        Mood and Affect: Mood and affect normal.     ED Results / Procedures / Treatments   Labs (all labs ordered are listed, but only abnormal results are displayed) Labs Reviewed  CBC WITH DIFFERENTIAL/PLATELET - Abnormal; Notable for the following components:      Result Value   RBC 3.80 (*)    Hemoglobin 10.3 (*)    HCT 34.0 (*)    Abs Immature Granulocytes 0.08 (*)    All other components within normal limits  COMPREHENSIVE METABOLIC PANEL - Abnormal; Notable for the following components:   Potassium 2.7 (*)    Glucose, Bld 174 (*)    Albumin 2.5 (*)    All other components within normal limits  TROPONIN I (HIGH SENSITIVITY) - Abnormal; Notable for the following components:   Troponin I (High Sensitivity) 44 (*)    All other components within normal limits  SARS CORONAVIRUS 2 BY RT PCR (HOSPITAL ORDER, [facility])   BRAIN NATRIURETIC PEPTIDE  URINALYSIS, ROUTINE W REFLEX MICROSCOPIC  TROPONIN I (HIGH SENSITIVITY)    EKG EKG Interpretation  Date/Time:  Monday June 20 2020 10:21:10 EST Ventricular Rate:  92 PR Interval:    QRS Duration: 113 QT Interval:  366 QTC Calculation: 453 R Axis:   -40 Text Interpretation: Sinus rhythm Prolonged PR interval Left atrial enlargement Borderline IVCD with LAD Repol abnrm suggests ischemia, lateral leads Confirmed by Lennice Sites [phone]) on 06/20/2020 10:29:43 AM   Radiology CT Angio Chest PE W and/or Wo Contrast  Result Date: 06/20/2020 CLINICAL DATA:  Dyspnea, lethargy EXAM: CT ANGIOGRAPHY CHEST WITH CONTRAST TECHNIQUE: Multidetector CT imaging of the chest was performed using the standard protocol during bolus administration of intravenous contrast. Multiplanar CT image reconstructions and MIPs were obtained to evaluate the vascular anatomy. CONTRAST:  65mL OMNIPAQUE IOHEXOL 350 MG/ML SOLN COMPARISON:  06/14/2020 FINDINGS: Cardiovascular: There is adequate opacification of the pulmonary arterial tree. There is no intraluminal filling defect identified to suggest acute pulmonary embolism. The central pulmonary arteries are enlarged, similar to that noted on prior examination, in keeping with changes of pulmonary arterial hypertension. Extensive multi-vessel coronary artery calcification again noted. Global cardiac size within normal limits. Large pericardial effusion is again seen, unchanged from prior examination. Moderate atherosclerotic calcification of the thoracic aorta again identified. No aortic aneurysm. Mediastinum/Nodes: No enlarged mediastinal, hilar, or axillary lymph nodes. Thyroid gland, trachea, and esophagus demonstrate no significant findings. Lungs/Pleura: Small bilateral pleural effusions are present, new since prior examination. Mild bibasilar associated compressive atelectasis. No superimposed confluent pulmonary infiltrate. No pneumothorax.  Central airways are widely patent. Upper Abdomen: No acute abnormality. Musculoskeletal: Anterior wedge compression fractures of T4  and T5 are again seen, chronic in nature, with resultant focal thoracic kyphosis. Review of the MIP images confirms the above findings. IMPRESSION: No pulmonary embolism. Extensive multi-vessel coronary artery calcification. Morphologic changes of pulmonary arterial hypertension. Stable large pericardial effusion. Interval development of small bilateral pleural effusions. Aortic Atherosclerosis (ICD10-I70.0). Electronically Signed   By: Helyn NumbersAshesh  Parikh MD   On: 06/20/2020 13:01   DG Chest Portable 1 View  Result Date: 06/20/2020 CLINICAL DATA:  Shortness of breath. EXAM: PORTABLE CHEST 1 VIEW COMPARISON:  April 19, 2020. FINDINGS: Stable cardiomegaly. No pneumothorax or pleural effusion is noted. Lungs are clear. Bony thorax is unremarkable. IMPRESSION: No active disease. Electronically Signed   By: Lupita RaiderJames  Green Jr M.D.   On: 06/20/2020 10:56   ECHOCARDIOGRAM COMPLETE  Result Date: 06/20/2020    ECHOCARDIOGRAM REPORT   Patient Name:   Robin DatesLEONORA Mcclure Date of Exam: 06/20/2020 Medical Rec #:  [id number]     Height:       63.0 in Accession #:    [id number]    Weight:       158.8 lb Date of Birth:  09/07/1922      BSA:          1.753 m Patient Age:    89 years      BP:           162/79 mmHg Patient Gender: F             HR:           88 bpm. Exam Location:  Inpatient Procedure: 2D Echo, Cardiac Doppler and Color Doppler STAT ECHO Indications:    Pericardial effusion  History:        Patient has no prior history of Echocardiogram examinations.                 Signs/Symptoms:Shortness of Breath and Pleural effusion.  Sonographer:    Lavenia AtlasBrooke Strickland Referring Phys: [id number] Danijah Noh IMPRESSIONS  1. Left ventricular ejection fraction, by estimation, is 60 to 65%. The left ventricle has normal function. The left ventricle has no regional wall motion abnormalities. Left ventricular  diastolic parameters are indeterminate. Elevated left ventricular end-diastolic pressure.  2. Right ventricular systolic function is normal. The right ventricular size is normal. There is moderately elevated pulmonary artery systolic pressure.  3. Left atrial size was mildly dilated.  4. The mitral valve is normal in structure. Trivial mitral valve regurgitation. No evidence of mitral stenosis.  5. The aortic valve is tricuspid. There is moderate calcification of the aortic valve. There is moderate thickening of the aortic valve. Aortic valve regurgitation is not visualized. Mild to moderate aortic valve sclerosis/calcification is present, without any evidence of aortic stenosis.  6. The inferior vena cava is normal in size with greater than 50% respiratory variability, suggesting right atrial pressure of 3 mmHg.  7. Moderate to large pericardial effusion. The pericardial effusion is circumferential. The effusion is largest posterior to the LV and apically measuring 2.63cm at greatest diameter. THe IVC is dilated and plethoric but there is no significant change in MV inflow with respirations. The epigastric view is forshortened and therefore cannot accurately assess for RV diastolic collapse.  8. No prior echo to compare regarding pericardial effusion. FINDINGS  Left Ventricle: Left ventricular ejection fraction, by estimation, is 60 to 65%. The left ventricle has normal function. The left ventricle has no regional wall motion abnormalities. The left ventricular internal cavity size was normal in size. There is  no  left ventricular hypertrophy. Left ventricular diastolic parameters are indeterminate. Elevated left ventricular end-diastolic pressure. Right Ventricle: The right ventricular size is normal. No increase in right ventricular wall thickness. Right ventricular systolic function is normal. There is moderately elevated pulmonary artery systolic pressure. The tricuspid regurgitant velocity is 3.47 m/s, and  with an assumed right atrial pressure of 3 mmHg, the estimated right ventricular systolic pressure is 61.9 mmHg. Left Atrium: Left atrial size was mildly dilated. Right Atrium: Right atrial size was normal in size. Pericardium: The effusion is largest posterior to the LV and apically measuring 2.63cm at greatest diameter. THe IVC is dilated and plethoric but there is no significant change in MV inflow with respirations. The epigastric view is forshortened and therefore cannot accurately assess for RV diastolic collapse. A large pericardial effusion is present. The pericardial effusion is circumferential. Mitral Valve: The mitral valve is normal in structure. Mild to moderate mitral annular calcification. Trivial mitral valve regurgitation. No evidence of mitral valve stenosis. Tricuspid Valve: The tricuspid valve is normal in structure. Tricuspid valve regurgitation is mild . No evidence of tricuspid stenosis. Aortic Valve: The aortic valve is tricuspid. There is moderate calcification of the aortic valve. There is moderate thickening of the aortic valve. Aortic valve regurgitation is not visualized. Mild to moderate aortic valve sclerosis/calcification is present, without any evidence of aortic stenosis. Aortic valve peak gradient measures 12.7 mmHg. Pulmonic Valve: The pulmonic valve was normal in structure. Pulmonic valve regurgitation is trivial. No evidence of pulmonic stenosis. Aorta: The aortic root is normal in size and structure. Venous: The inferior vena cava is normal in size with greater than 50% respiratory variability, suggesting right atrial pressure of 3 mmHg. IAS/Shunts: No atrial level shunt detected by color flow Doppler. Additional Comments: There is a small pleural effusion in the left lateral region.  LEFT VENTRICLE PLAX 2D LVIDd:         3.50 cm  Diastology LVIDs:         2.40 cm  LV e' medial:    5.44 cm/s LV PW:         1.10 cm  LV E/e' medial:  26.3 LV IVS:        1.10 cm  LV e' lateral:    9.14 cm/s LVOT diam:     2.10 cm  LV E/e' lateral: 15.6 LV SV:         63 LV SV Index:   36 LVOT Area:     3.46 cm  RIGHT VENTRICLE RV Basal diam:  3.20 cm RV S prime:     12.20 cm/s TAPSE (M-mode): 3.0 cm LEFT ATRIUM             Index       RIGHT ATRIUM           Index LA diam:        3.60 cm 2.05 cm/m  RA Area:     17.50 cm LA Vol (A2C):   56.0 ml 31.94 ml/m RA Volume:   51.50 ml  29.38 ml/m LA Vol (A4C):   68.7 ml 39.19 ml/m LA Biplane Vol: 61.9 ml 35.31 ml/m  AORTIC VALVE AV Area (Vmax): 2.34 cm AV Vmax:        178.00 cm/s AV Peak Grad:   12.7 mmHg LVOT Vmax:      120.00 cm/s LVOT Vmean:     68.800 cm/s LVOT VTI:       0.181 m  AORTA Ao Root diam: 2.80 cm  MITRAL VALVE                TRICUSPID VALVE MV Area (PHT): 10.25 cm    TR Peak grad:   48.2 mmHg MV Decel Time: 74 msec      TR Vmax:        347.00 cm/s MV E velocity: 143.00 cm/s MV A velocity: 45.30 cm/s   SHUNTS MV E/A ratio:  3.16         Systemic VTI:  0.18 m                             Systemic Diam: 2.10 cm Fransico Him MD Electronically signed by Fransico Him MD Signature Date/Time: 06/20/2020/12:17:09 PM    Final     Procedures .Critical Care Performed by: Lennice Sites, DO Authorized by: Lennice Sites, DO   Critical care provider statement:    Critical care time (minutes):  35   Critical care was necessary to treat or prevent imminent or life-threatening deterioration of the following conditions:  Respiratory failure   Critical care was time spent personally by me on the following activities:  Blood draw for specimens, development of treatment plan with patient or surrogate, discussions with consultants, discussions with primary provider, evaluation of patient's response to treatment, examination of patient, obtaining history from patient or surrogate, ordering and performing treatments and interventions, ordering and review of laboratory studies, ordering and review of radiographic studies, pulse oximetry, re-evaluation of  patient's condition and review of old charts   I assumed direction of critical care for this patient from another provider in my specialty: no       Medications Ordered in ED Medications  potassium chloride 10 mEq in 100 mL IVPB (10 mEq Intravenous New Bag/Given 06/20/20 1307)  potassium chloride SA (KLOR-CON) CR tablet 40 mEq (40 mEq Oral Given 06/20/20 1308)  iohexol (OMNIPAQUE) 350 MG/ML injection 75 mL (75 mLs Intravenous Contrast Given 06/20/20 1251)    ED Course  I have reviewed the triage vital signs and the nursing notes.  Pertinent labs & imaging results that were available during my care of the patient were reviewed by me and considered in my medical decision making (see chart for details).    MDM Rules/Calculators/A&P                          Lizvet Tantillo is a 87 year old female with history of diabetes, hypertension, high cholesterol who presents the ED with shortness of breath.  Had a CT scan done last week that showed large pericardial effusion and small pleural effusion.  Only got report today from primary care doctor and was sent for evaluation.  She has not had any chest pain but she has had a cough.  Denies any viral symptoms.  However not vaccinated against COVID.  Symptoms have been ongoing for weeks.  Had a fairly unremarkable chest x-ray about 2 months ago when things seem to first start.  She is mildly hypoxic on exam to the upper 80s and was placed on 1 L of oxygen.  She appears clinically dehydrated with dry mucous membranes.  However she does seem to have some peripheral edema.  She does not appear to be in any evidence of cardiac tamponade given hypertension.  EKG shows sinus rhythm.  Some T wave inversions but otherwise no real pericarditis changes or ischemic changes.  Talked to cardiology and will get a  formal echocardiogram and continue work-up with basic labs, troponin, BNP.  May consider CT scan with contrast to further evaluate for PE.  Has a distant history of  colon cancer about 30 years ago but has been in remission ever since.  CT scan shows no infectious process, no PE.  Stable on 2 L of oxygen.  Overall lab work is unremarkable except for potassium of 2.7 and this was repleted.  Echocardiogram was performed that showed no evidence of tamponade but does show large pericardial effusion.  This seems to have started may be after some viral symptoms but her Covid test is negative.  Suspect likely in a pericarditis in the setting of viral process that occurred weeks back.  Cardiology has evaluated the patient and will start NSAIDs and colchicine and will admit for further care.  This chart was dictated using voice recognition software.  Despite best efforts to proofread,  errors can occur which can change the documentation meaning.    Final Clinical Impression(s) / ED Diagnoses Final diagnoses:  Pericardial effusion  Acute respiratory failure, unspecified whether with hypoxia or hypercapnia [facility])    Rx / DC Orders ED Discharge Orders    None       Lennice Sites, DO 06/20/20 1310

## 2020-06-20 NOTE — H&P (Signed)
History and Physical    Robin Mcclure WFU:[id number] DOB: [birth year] DOA: 06/20/2020  PCP: Lujean Amel, MD (Confirm with patient/family/NH records and if not entered, this has to be entered at [facility] point of entry) Patient coming from: Home  I have personally briefly reviewed patient's old medical records in [place]  Chief Complaint: Feeling tired, SOB.  HPI: Robin Mcclure is a 87 y.o. female with medical history significant of HTN, HLD, IIDM, osteoporosis, presented with worsening of feeling fatigued, shortness of breath.  Her symptoms started around Thanksgiving time, patient started to dry cough the next day she complained of severe sharp-like chest pain x1 day then chest pain disappeared but cough has been persistent since.  Mostly dry cough and her PCP tried Tessalon for 7+ days with minimal relief.  She denies any fever chills, she seems to have more cough at night.  Gradually, she became more fatigued and the easily get up and tired with even minimum activity.  Family report at bedside that she barely moved from a chair since last weekend.  Denies any wheezing, abdominal pain diarrhea urinary symptoms.  She was tested for Covid 2 times since November, were all negative.  Patient went to see her PCP 6 days ago who had an outpatient CT scan.  And today the results came back showed large pericardial effusion and PCP recommended patient come to the ER. ED Course: Blood pressure significant elevated, on 2 L for comfort.  Repeat CT angiogram showed negative PE but large pericardial effusion circumferential.  Echocardiogram done today showed no signs of tamponade.  Review of Systems: As per HPI otherwise 14 point review of systems negative.    Past Medical History:  Diagnosis Date  . Diabetes mellitus without complication ([place])   . Hyperlipidemia   . Hypertension     Past Surgical History:  Procedure Laterality Date  . ABDOMINAL HYSTERECTOMY    . BIOPSY  01/19/2018    Procedure: BIOPSY;  Surgeon: Ronnette Juniper, MD;  Location: [facility];  Service: Gastroenterology;;  . CHOLECYSTECTOMY    . ESOPHAGOGASTRODUODENOSCOPY (EGD) WITH PROPOFOL N/A 01/19/2018   Procedure: ESOPHAGOGASTRODUODENOSCOPY (EGD) WITH PROPOFOL;  Surgeon: Ronnette Juniper, MD;  Location: [facility];  Service: Gastroenterology;  Laterality: N/A;  . HIP SURGERY       reports that she has never smoked. She has never used smokeless tobacco. She reports that she does not drink alcohol and does not use drugs.  No Known Allergies  No family history on file.   Prior to Admission medications   Medication Sig Start Date End Date Taking? Authorizing Provider  acetaminophen (TYLENOL) 325 MG tablet Take 325 mg by mouth every 6 (six) hours as needed for mild pain.    [provider]  albuterol (PROVENTIL HFA;VENTOLIN HFA) 108 (90 Base) MCG/ACT inhaler Inhale 1-2 puffs into the lungs every 6 (six) hours as needed for wheezing or shortness of breath.    [provider]  aspirin 81 MG EC tablet Take 81 mg by mouth every Monday, Wednesday, and Friday. Swallow whole.     [provider]  B Complex Vitamins (B COMPLEX PO) Take 1 tablet by mouth daily.    [provider]  cephALEXin (KEFLEX) 500 MG capsule Take 500 mg by mouth 3 (three) times daily. 10/21/19   [provider]  Cholecalciferol (D3-1000) 1000 units tablet Take 1,000 Units by mouth daily.     [provider]  CRANBERRY CONCENTRATE PO Take 25,000 Units by mouth daily.  [provider]  denosumab (PROLIA) 60 MG/ML SOLN injection Inject 60 mg into the skin every 6 (six) months. Administer in upper arm, thigh, or abdomen    [provider]  Fluticasone-Salmeterol (ADVAIR DISKUS) 500-50 MCG/DOSE AEPB Inhale 1 puff into the lungs 2 (two) times daily.    [provider]  furosemide (LASIX) 40 MG tablet Take 40 mg by mouth daily.     [provider]  glipiZIDE  (GLUCOTROL XL) 2.5 MG 24 hr tablet Take 2.5 mg by mouth daily with breakfast.    [provider]  losartan (COZAAR) 25 MG tablet Take 50 mg by mouth daily. 09/30/18   [provider]  losartan (COZAAR) 50 MG tablet Take 50 mg by mouth daily.    [provider]  Multiple Minerals (CALCIUM/MAGNESIUM/ZINC PO) Take 1 tablet by mouth daily.    [provider]  Multiple Vitamins-Minerals ([employer] SPECTRAVITE SENIOR PO) Take 1 tablet by mouth daily.     [provider]  NIFEdipine (ADALAT CC) 90 MG 24 hr tablet Take 90 mg by mouth daily.    [provider]  ondansetron (ZOFRAN) 4 MG tablet Take 4 mg by mouth 3 (three) times daily as needed. 01/20/18   [provider]  pantoprazole (PROTONIX) 20 MG tablet Take 1 tablet (20 mg total) by mouth daily. 01/19/18   Deno Etienne, DO  pantoprazole (PROTONIX) 40 MG tablet Take 40 mg by mouth daily. 03/10/18   [provider]  PAZEO 0.7 % SOLN INSTILL 1 DROP INTO BOTH EYES EVERY DAY 07/11/18   [provider]  penicillin v potassium (VEETID) 500 MG tablet  04/14/18   [provider]  pravastatin (PRAVACHOL) 20 MG tablet Take 20 mg by mouth daily.    [provider]  sitaGLIPtin-metformin (JANUMET) 50-1000 MG tablet Take 1 tablet by mouth daily.     [provider]  sucralfate (CARAFATE) 1 GM/10ML suspension Take 10 mLs (1 g total) by mouth 4 (four) times daily -  with meals and at bedtime. 01/19/18   Deno Etienne, DO    Physical Exam: Vitals:   06/20/20 1130 06/20/20 1200 06/20/20 1230 06/20/20 1308  BP: (!) 162/78 (!) 154/50 (!) 168/87 (!) 147/90  Pulse: 87 84 91 96  Resp: (!) 25 (!) 30 (!) 31 (!) 26  Temp:      SpO2: 92% 93% 94% 92%    Constitutional: NAD, calm, comfortable Vitals:   06/20/20 1130 06/20/20 1200 06/20/20 1230 06/20/20 1308  BP: (!) 162/78 (!) 154/50 (!) 168/87 (!) 147/90  Pulse: 87 84 91 96  Resp: (!) 25 (!) 30 (!) 31 (!) 26  Temp:      SpO2:  92% 93% 94% 92%   Eyes: PERRL, lids and conjunctivae normal ENMT: Mucous membranes are moist. Posterior pharynx clear of any exudate or lesions.Normal dentition.  Neck: normal, supple, no masses, no thyromegaly Respiratory: clear to auscultation bilaterally, no wheezing, no crackles. Increasing respiratory effort. No accessory muscle use.  Cardiovascular: Regular rate and rhythm, systolic murmur on heart base, two phased rubbing sound on lower sternal edge. mild extremity edema. 2+ pedal pulses. No carotid bruits.  Abdomen: no tenderness, no masses palpated. No hepatosplenomegaly. Bowel sounds positive.  Musculoskeletal: no clubbing / cyanosis. No joint deformity upper and lower extremities. Good ROM, no contractures. Normal muscle tone.  Skin: no rashes, lesions, ulcers. No induration Neurologic: CN 2-12 grossly intact. Sensation intact, DTR normal. Strength 5/5 in all 4.  Psychiatric: Normal judgment  and insight. Alert and oriented x 3. Normal mood.     Labs on Admission: I have personally reviewed following labs and imaging studies  CBC: Recent Labs  Lab 06/20/20 1030  WBC 9.3  NEUTROABS 7.1  HGB 10.3*  HCT 34.0*  MCV 89.5  PLT 400   Basic Metabolic Panel: Recent Labs  Lab 06/20/20 1030  NA 139  K 2.7*  CL 103  CO2 23  GLUCOSE 174*  BUN 12  CREATININE 0.69  CALCIUM 10.1   GFR: CrCl cannot be calculated (Unknown ideal weight.). Liver Function Tests: Recent Labs  Lab 06/20/20 1030  AST 18  ALT 18  ALKPHOS 83  BILITOT 0.9  PROT 6.5  ALBUMIN 2.5*   No results for input(s): LIPASE, AMYLASE in the last 168 hours. No results for input(s): AMMONIA in the last 168 hours. Coagulation Profile: No results for input(s): INR, PROTIME in the last 168 hours. Cardiac Enzymes: No results for input(s): CKTOTAL, CKMB, CKMBINDEX, TROPONINI in the last 168 hours. BNP (last 3 results) No results for input(s): PROBNP in the last 8760 hours. HbA1C: No results for input(s):  HGBA1C in the last 72 hours. CBG: No results for input(s): GLUCAP in the last 168 hours. Lipid Profile: No results for input(s): CHOL, HDL, LDLCALC, TRIG, CHOLHDL, LDLDIRECT in the last 72 hours. Thyroid Function Tests: No results for input(s): TSH, T4TOTAL, FREET4, T3FREE, THYROIDAB in the last 72 hours. Anemia Panel: No results for input(s): VITAMINB12, FOLATE, FERRITIN, TIBC, IRON, RETICCTPCT in the last 72 hours. Urine analysis: No results found for: COLORURINE, APPEARANCEUR, LABSPEC, PHURINE, GLUCOSEU, HGBUR, BILIRUBINUR, KETONESUR, PROTEINUR, UROBILINOGEN, NITRITE, LEUKOCYTESUR  Radiological Exams on Admission: CT Angio Chest PE W and/or Wo Contrast  Result Date: 06/20/2020 CLINICAL DATA:  Dyspnea, lethargy EXAM: CT ANGIOGRAPHY CHEST WITH CONTRAST TECHNIQUE: Multidetector CT imaging of the chest was performed using the standard protocol during bolus administration of intravenous contrast. Multiplanar CT image reconstructions and MIPs were obtained to evaluate the vascular anatomy. CONTRAST:  26mL OMNIPAQUE IOHEXOL 350 MG/ML SOLN COMPARISON:  06/14/2020 FINDINGS: Cardiovascular: There is adequate opacification of the pulmonary arterial tree. There is no intraluminal filling defect identified to suggest acute pulmonary embolism. The central pulmonary arteries are enlarged, similar to that noted on prior examination, in keeping with changes of pulmonary arterial hypertension. Extensive multi-vessel coronary artery calcification again noted. Global cardiac size within normal limits. Large pericardial effusion is again seen, unchanged from prior examination. Moderate atherosclerotic calcification of the thoracic aorta again identified. No aortic aneurysm. Mediastinum/Nodes: No enlarged mediastinal, hilar, or axillary lymph nodes. Thyroid gland, trachea, and esophagus demonstrate no significant findings. Lungs/Pleura: Small bilateral pleural effusions are present, new since prior examination. Mild  bibasilar associated compressive atelectasis. No superimposed confluent pulmonary infiltrate. No pneumothorax. Central airways are widely patent. Upper Abdomen: No acute abnormality. Musculoskeletal: Anterior wedge compression fractures of T4 and T5 are again seen, chronic in nature, with resultant focal thoracic kyphosis. Review of the MIP images confirms the above findings. IMPRESSION: No pulmonary embolism. Extensive multi-vessel coronary artery calcification. Morphologic changes of pulmonary arterial hypertension. Stable large pericardial effusion. Interval development of small bilateral pleural effusions. Aortic Atherosclerosis (ICD10-I70.0). Electronically Signed   By: Helyn Numbers MD   On: 06/20/2020 13:01   DG Chest Portable 1 View  Result Date: 06/20/2020 CLINICAL DATA:  Shortness of breath. EXAM: PORTABLE CHEST 1 VIEW COMPARISON:  April 19, 2020. FINDINGS: Stable cardiomegaly. No pneumothorax or pleural effusion is noted. Lungs are clear. Bony thorax is unremarkable. IMPRESSION: No  active disease. Electronically Signed   By: Marijo Conception M.D.   On: 06/20/2020 10:56   ECHOCARDIOGRAM COMPLETE  Result Date: 06/20/2020    ECHOCARDIOGRAM REPORT   Patient Name:   Robin Mcclure Date of Exam: 06/20/2020 Medical Rec #:  [id number]     Height:       63.0 in Accession #:    [id number]    Weight:       158.8 lb Date of Birth:  1923/03/27      BSA:          1.753 m Patient Age:    89 years      BP:           162/79 mmHg Patient Gender: F             HR:           88 bpm. Exam Location:  Inpatient Procedure: 2D Echo, Cardiac Doppler and Color Doppler STAT ECHO Indications:    Pericardial effusion  History:        Patient has no prior history of Echocardiogram examinations.                 Signs/Symptoms:Shortness of Breath and Pleural effusion.  Sonographer:    Dustin Flock Referring Phys: [id number] ADAM CURATOLO IMPRESSIONS  1. Left ventricular ejection fraction, by estimation, is 60 to 65%. The left  ventricle has normal function. The left ventricle has no regional wall motion abnormalities. Left ventricular diastolic parameters are indeterminate. Elevated left ventricular end-diastolic pressure.  2. Right ventricular systolic function is normal. The right ventricular size is normal. There is moderately elevated pulmonary artery systolic pressure.  3. Left atrial size was mildly dilated.  4. The mitral valve is normal in structure. Trivial mitral valve regurgitation. No evidence of mitral stenosis.  5. The aortic valve is tricuspid. There is moderate calcification of the aortic valve. There is moderate thickening of the aortic valve. Aortic valve regurgitation is not visualized. Mild to moderate aortic valve sclerosis/calcification is present, without any evidence of aortic stenosis.  6. The inferior vena cava is normal in size with greater than 50% respiratory variability, suggesting right atrial pressure of 3 mmHg.  7. Moderate to large pericardial effusion. The pericardial effusion is circumferential. The effusion is largest posterior to the LV and apically measuring 2.63cm at greatest diameter. THe IVC is dilated and plethoric but there is no significant change in MV inflow with respirations. The epigastric view is forshortened and therefore cannot accurately assess for RV diastolic collapse.  8. No prior echo to compare regarding pericardial effusion. FINDINGS  Left Ventricle: Left ventricular ejection fraction, by estimation, is 60 to 65%. The left ventricle has normal function. The left ventricle has no regional wall motion abnormalities. The left ventricular internal cavity size was normal in size. There is  no left ventricular hypertrophy. Left ventricular diastolic parameters are indeterminate. Elevated left ventricular end-diastolic pressure. Right Ventricle: The right ventricular size is normal. No increase in right ventricular wall thickness. Right ventricular systolic function is normal. There is  moderately elevated pulmonary artery systolic pressure. The tricuspid regurgitant velocity is 3.47 m/s, and with an assumed right atrial pressure of 3 mmHg, the estimated right ventricular systolic pressure is 15.4 mmHg. Left Atrium: Left atrial size was mildly dilated. Right Atrium: Right atrial size was normal in size. Pericardium: The effusion is largest posterior to the LV and apically measuring 2.63cm at greatest diameter. THe IVC is dilated and plethoric but there  is no significant change in MV inflow with respirations. The epigastric view is forshortened and therefore cannot accurately assess for RV diastolic collapse. A large pericardial effusion is present. The pericardial effusion is circumferential. Mitral Valve: The mitral valve is normal in structure. Mild to moderate mitral annular calcification. Trivial mitral valve regurgitation. No evidence of mitral valve stenosis. Tricuspid Valve: The tricuspid valve is normal in structure. Tricuspid valve regurgitation is mild . No evidence of tricuspid stenosis. Aortic Valve: The aortic valve is tricuspid. There is moderate calcification of the aortic valve. There is moderate thickening of the aortic valve. Aortic valve regurgitation is not visualized. Mild to moderate aortic valve sclerosis/calcification is present, without any evidence of aortic stenosis. Aortic valve peak gradient measures 12.7 mmHg. Pulmonic Valve: The pulmonic valve was normal in structure. Pulmonic valve regurgitation is trivial. No evidence of pulmonic stenosis. Aorta: The aortic root is normal in size and structure. Venous: The inferior vena cava is normal in size with greater than 50% respiratory variability, suggesting right atrial pressure of 3 mmHg. IAS/Shunts: No atrial level shunt detected by color flow Doppler. Additional Comments: There is a small pleural effusion in the left lateral region.  LEFT VENTRICLE PLAX 2D LVIDd:         3.50 cm  Diastology LVIDs:         2.40 cm  LV e'  medial:    5.44 cm/s LV PW:         1.10 cm  LV E/e' medial:  26.3 LV IVS:        1.10 cm  LV e' lateral:   9.14 cm/s LVOT diam:     2.10 cm  LV E/e' lateral: 15.6 LV SV:         63 LV SV Index:   36 LVOT Area:     3.46 cm  RIGHT VENTRICLE RV Basal diam:  3.20 cm RV S prime:     12.20 cm/s TAPSE (M-mode): 3.0 cm LEFT ATRIUM             Index       RIGHT ATRIUM           Index LA diam:        3.60 cm 2.05 cm/m  RA Area:     17.50 cm LA Vol (A2C):   56.0 ml 31.94 ml/m RA Volume:   51.50 ml  29.38 ml/m LA Vol (A4C):   68.7 ml 39.19 ml/m LA Biplane Vol: 61.9 ml 35.31 ml/m  AORTIC VALVE AV Area (Vmax): 2.34 cm AV Vmax:        178.00 cm/s AV Peak Grad:   12.7 mmHg LVOT Vmax:      120.00 cm/s LVOT Vmean:     68.800 cm/s LVOT VTI:       0.181 m  AORTA Ao Root diam: 2.80 cm MITRAL VALVE                TRICUSPID VALVE MV Area (PHT): 10.25 cm    TR Peak grad:   48.2 mmHg MV Decel Time: 74 msec      TR Vmax:        347.00 cm/s MV E velocity: 143.00 cm/s MV A velocity: 45.30 cm/s   SHUNTS MV E/A ratio:  3.16         Systemic VTI:  0.18 m  Systemic Diam: 2.10 cm Fransico Him MD Electronically signed by Fransico Him MD Signature Date/Time: 06/20/2020/12:17:09 PM    Final     EKG: Independently reviewed. Diffused ST changes, flattening of T.  Assessment/Plan Active Problems:   Pericarditis  (please populate well all problems here in Problem List. (For example, if patient is on BP meds at home and you resume or decide to hold them, it is a problem that needs to be her. Same for CAD, COPD, HLD and so on)  Pericarditis -Appears to be subacute? Since the onset of her symptoms >2 months now. -Not sure a large pericardial effusion is the sequelae or still part of an still active pericarditis going on, check ESR and CRP. -coxsackie A/B -Increase her Lasix from p.o. to IV twice daily. -Cardiology to see the patient this afternoon to decide further treatment plan. -Echo equivocal for right  ventricle collapse, but clinically blood pressure stable and no pulses paradoxus, implying no tamponade. Not considering pericardial window as of now.  Severe hypokalemia -P.o. and IV replacement and then recheck -Magnesium and phosphorus  Persistent cough -Appears to have worsening symptoms at night, suspect GERD worsening, increased PPI -Continue Carafate  IIDM -Stop sulfonylurea and Metformin -Sliding scale for now  Controlled HTN -Increase ARB -Continue nifedipine -Increase Lasix as above  DVT prophylaxis: Lovenox  code Status: Full Code Family Communication: Daughter at bedside Disposition Plan: Expect more than two midnight hospital stay Consults called: Cardiology Admission status:Tele admit   Lequita Halt MD [facility] Pager [phone]  06/20/2020, 1:52 PM

## 2020-06-20 NOTE — ED Notes (Signed)
06/20/2020 1136am   Test: Potassium  Critical Value: K: 2.7  Name of Provider Notified: Dr. Ronnald Nian

## 2020-06-20 NOTE — ED Notes (Signed)
Patient transported to CT 

## 2020-06-20 NOTE — Consult Note (Addendum)
Cardiology Consultation:   Patient ID: Robin Mcclure MRN: [id number]; DOB: Oct 13, 1922  Admit date: 06/20/2020 Date of Consult: 06/20/2020  Primary Care Provider: Lujean Amel, [facility] Cardiologist: New to Dr. Stanford Breed    Patient Profile:   Robin Mcclure is a 87 y.o. female with a hx of diabetes mellitus with neuropathy, hypertension, hyperlipidemia and history of colon cancer 30 years ago s/p colectomy and chemotherapy (in remission) who is being seen today for the evaluation of shortness of breath and pericardial effusion at the request of Dr. Ronnald Nian.  No prior cardiac history.  History of Present Illness:   Robin Mcclure has been dealing with 3 months history of progressively worsening shortness of breath.  This started around Thanksgiving when she had a sudden onset shortness of breath with discomfort on epigastric area and back. She has been evaluated by PCP with multiple negative Covid test and reassuring chest x-ray.  Breathing worse with laying down.  She has cough with some Phel[place].  No chest pain, palpitation, dizziness, melena, blood in his stool or syncope. Patient lives with daughter who provided most of history.  Sedentary lifestyle.  Walking only to use bathroom and shower.  Before this she was able to do some stuff but not in the past 3 months. Most recently had a CT of the chest on January 25 showing mild to moderate cardiomegaly, large pericardial effusion, small left pleural effusion and aortic atherosclerotic calcification.  Advised to come to ER.   Vital signs stable.  High-sensitivity troponin 44.  Potassium 2.7.  Creatinine 0.69 Covid negative.  Chest x-ray without acute disease.    Echo 06/20/20 1. Left ventricular ejection fraction, by estimation, is 60 to 65%. The  left ventricle has normal function. The left ventricle has no regional  wall motion abnormalities. Left ventricular diastolic parameters are  indeterminate. Elevated left ventricular   end-diastolic pressure.  2. Right ventricular systolic function is normal. The right ventricular  size is normal. There is moderately elevated pulmonary artery systolic  pressure.  3. Left atrial size was mildly dilated.  4. The mitral valve is normal in structure. Trivial mitral valve  regurgitation. No evidence of mitral stenosis.  5. The aortic valve is tricuspid. There is moderate calcification of the  aortic valve. There is moderate thickening of the aortic valve. Aortic  valve regurgitation is not visualized. Mild to moderate aortic valve  sclerosis/calcification is present,  without any evidence of aortic stenosis.  6. The inferior vena cava is normal in size with greater than 50%  respiratory variability, suggesting right atrial pressure of 3 mmHg.  7. Moderate to large pericardial effusion. The pericardial effusion is  circumferential. The effusion is largest posterior to the LV and apically  measuring 2.63cm at greatest diameter. THe IVC is dilated and plethoric  but there is no significant change  in MV inflow with respirations. The epigastric view is forshortened and  therefore cannot accurately assess for RV diastolic collapse.  8. No prior echo to compare regarding pericardial effusion.   Past Medical History:  Diagnosis Date  . Diabetes mellitus without complication ([place])   . Hyperlipidemia   . Hypertension     Past Surgical History:  Procedure Laterality Date  . ABDOMINAL HYSTERECTOMY    . BIOPSY  01/19/2018   Procedure: BIOPSY;  Surgeon: Ronnette Juniper, MD;  Location: [facility];  Service: Gastroenterology;;  . CHOLECYSTECTOMY    . ESOPHAGOGASTRODUODENOSCOPY (EGD) WITH PROPOFOL N/A 01/19/2018   Procedure: ESOPHAGOGASTRODUODENOSCOPY (EGD) WITH PROPOFOL;  Surgeon:  Ronnette Juniper, MD;  Location: [facility] ENDOSCOPY;  Service: Gastroenterology;  Laterality: N/A;  . HIP SURGERY       Inpatient Medications: Scheduled Meds: . potassium chloride  40 mEq Oral Once    Continuous Infusions: . potassium chloride     PRN Meds:   Allergies:   No Known Allergies  Social History:   Social History   Socioeconomic History  . Marital status: Widowed    Spouse name: Not on file  . Number of children: Not on file  . Years of education: Not on file  . Highest education level: Not on file  Occupational History  . Not on file  Tobacco Use  . Smoking status: Never Smoker  . Smokeless tobacco: Never Used  Substance and Sexual Activity  . Alcohol use: No  . Drug use: No  . Sexual activity: Not on file  Other Topics Concern  . Not on file  Social History Narrative  . Not on file   Social Determinants of Health   Financial Resource Strain: Not on file  Food Insecurity: Not on file  Transportation Needs: Not on file  Physical Activity: Not on file  Stress: Not on file  Social Connections: Not on file  Intimate Partner Violence: Not on file    Family History:   No family hx of CAD.   ROS:  Please see the history of present illness. All other ROS reviewed and negative.     Physical Exam/Data:   Vitals:   06/20/20 1045 06/20/20 1100 06/20/20 1130 06/20/20 1200  BP: (!) 168/81 (!) 162/79 (!) 162/78 (!) 154/50  Pulse: 88 88 87 84  Resp: (!) 26 (!) 27 (!) 25 (!) 30  Temp:      SpO2: 94% 92% 92% 93%   No intake or output data in the 24 hours ending 06/20/20 1225 Last 3 Weights 01/19/2018  Weight (lbs) 158 lb 12.8 oz  Weight (kg) 72.031 kg     There is no height or weight on file to calculate BMI.  General: Ill-appearing female in no acute distress  HEENT: normal Lymph: no adenopathy Neck: + JVD Endocrine:  No thryomegaly Vascular: No carotid bruits; FA pulses 2+ bilaterally without bruits  Cardiac:  normal S1, S2; RRR; no murmur Lungs:  diminished breath sound on L base Abd: soft, nontender, no hepatomegaly  Ext: Trace edema Musculoskeletal:  No deformities, BUE and BLE strength normal and equal Skin: warm and dry  Neuro:  CNs  2-12 intact, no focal abnormalities noted Psych:  Normal affect   EKG:  The EKG was personally reviewed and demonstrates:  SR, HR 92, prolonged PR interval, repolarization abnormality  Telemetry:  Telemetry was personally reviewed and demonstrates: Sinus rhythm   Relevant CV Studies:  AS above   Laboratory Data:  High Sensitivity Troponin:   Recent Labs  Lab 06/20/20 1030  TROPONINIHS 44*     Chemistry Recent Labs  Lab 06/20/20 1030  NA 139  K 2.7*  CL 103  CO2 23  GLUCOSE 174*  BUN 12  CREATININE 0.69  CALCIUM 10.1  GFRNONAA >60  ANIONGAP 13    Recent Labs  Lab 06/20/20 1030  PROT 6.5  ALBUMIN 2.5*  AST 18  ALT 18  ALKPHOS 83  BILITOT 0.9   Hematology Recent Labs  Lab 06/20/20 1030  WBC 9.3  RBC 3.80*  HGB 10.3*  HCT 34.0*  MCV 89.5  MCH 27.1  MCHC 30.3  RDW 15.2  PLT 400  Radiology/Studies:  DG Chest Portable 1 View  Result Date: 06/20/2020 CLINICAL DATA:  Shortness of breath. EXAM: PORTABLE CHEST 1 VIEW COMPARISON:  April 19, 2020. FINDINGS: Stable cardiomegaly. No pneumothorax or pleural effusion is noted. Lungs are clear. Bony thorax is unremarkable. IMPRESSION: No active disease. Electronically Signed   By: Marijo Conception M.D.   On: 06/20/2020 10:56   ECHOCARDIOGRAM COMPLETE  Result Date: 06/20/2020    ECHOCARDIOGRAM REPORT   Patient Name:   ZYONNA STANKOVIC Date of Exam: 06/20/2020 Medical Rec #:  [id number]     Height:       63.0 in Accession #:    [id number]    Weight:       158.8 lb Date of Birth:  [birth year]      BSA:          1.753 m Patient Age:    89 years      BP:           162/79 mmHg Patient Gender: F             HR:           88 bpm. Exam Location:  Inpatient Procedure: 2D Echo, Cardiac Doppler and Color Doppler STAT ECHO Indications:    Pericardial effusion  History:        Patient has no prior history of Echocardiogram examinations.                 Signs/Symptoms:Shortness of Breath and Pleural effusion.  Sonographer:    Dustin Flock Referring Phys: [id number] ADAM CURATOLO IMPRESSIONS  1. Left ventricular ejection fraction, by estimation, is 60 to 65%. The left ventricle has normal function. The left ventricle has no regional wall motion abnormalities. Left ventricular diastolic parameters are indeterminate. Elevated left ventricular end-diastolic pressure.  2. Right ventricular systolic function is normal. The right ventricular size is normal. There is moderately elevated pulmonary artery systolic pressure.  3. Left atrial size was mildly dilated.  4. The mitral valve is normal in structure. Trivial mitral valve regurgitation. No evidence of mitral stenosis.  5. The aortic valve is tricuspid. There is moderate calcification of the aortic valve. There is moderate thickening of the aortic valve. Aortic valve regurgitation is not visualized. Mild to moderate aortic valve sclerosis/calcification is present, without any evidence of aortic stenosis.  6. The inferior vena cava is normal in size with greater than 50% respiratory variability, suggesting right atrial pressure of 3 mmHg.  7. Moderate to large pericardial effusion. The pericardial effusion is circumferential. The effusion is largest posterior to the LV and apically measuring 2.63cm at greatest diameter. THe IVC is dilated and plethoric but there is no significant change in MV inflow with respirations. The epigastric view is forshortened and therefore cannot accurately assess for RV diastolic collapse.  8. No prior echo to compare regarding pericardial effusion. FINDINGS  Left Ventricle: Left ventricular ejection fraction, by estimation, is 60 to 65%. The left ventricle has normal function. The left ventricle has no regional wall motion abnormalities. The left ventricular internal cavity size was normal in size. There is  no left ventricular hypertrophy. Left ventricular diastolic parameters are indeterminate. Elevated left ventricular end-diastolic pressure. Right Ventricle: The  right ventricular size is normal. No increase in right ventricular wall thickness. Right ventricular systolic function is normal. There is moderately elevated pulmonary artery systolic pressure. The tricuspid regurgitant velocity is 3.47 m/s, and with an assumed right atrial pressure of 3 mmHg, the estimated right ventricular  systolic pressure is 0000000 mmHg. Left Atrium: Left atrial size was mildly dilated. Right Atrium: Right atrial size was normal in size. Pericardium: The effusion is largest posterior to the LV and apically measuring 2.63cm at greatest diameter. THe IVC is dilated and plethoric but there is no significant change in MV inflow with respirations. The epigastric view is forshortened and therefore cannot accurately assess for RV diastolic collapse. A large pericardial effusion is present. The pericardial effusion is circumferential. Mitral Valve: The mitral valve is normal in structure. Mild to moderate mitral annular calcification. Trivial mitral valve regurgitation. No evidence of mitral valve stenosis. Tricuspid Valve: The tricuspid valve is normal in structure. Tricuspid valve regurgitation is mild . No evidence of tricuspid stenosis. Aortic Valve: The aortic valve is tricuspid. There is moderate calcification of the aortic valve. There is moderate thickening of the aortic valve. Aortic valve regurgitation is not visualized. Mild to moderate aortic valve sclerosis/calcification is present, without any evidence of aortic stenosis. Aortic valve peak gradient measures 12.7 mmHg. Pulmonic Valve: The pulmonic valve was normal in structure. Pulmonic valve regurgitation is trivial. No evidence of pulmonic stenosis. Aorta: The aortic root is normal in size and structure. Venous: The inferior vena cava is normal in size with greater than 50% respiratory variability, suggesting right atrial pressure of 3 mmHg. IAS/Shunts: No atrial level shunt detected by color flow Doppler. Additional Comments: There is a  small pleural effusion in the left lateral region.  LEFT VENTRICLE PLAX 2D LVIDd:         3.50 cm  Diastology LVIDs:         2.40 cm  LV e' medial:    5.44 cm/s LV PW:         1.10 cm  LV E/e' medial:  26.3 LV IVS:        1.10 cm  LV e' lateral:   9.14 cm/s LVOT diam:     2.10 cm  LV E/e' lateral: 15.6 LV SV:         63 LV SV Index:   36 LVOT Area:     3.46 cm  RIGHT VENTRICLE RV Basal diam:  3.20 cm RV S prime:     12.20 cm/s TAPSE (M-mode): 3.0 cm LEFT ATRIUM             Index       RIGHT ATRIUM           Index LA diam:        3.60 cm 2.05 cm/m  RA Area:     17.50 cm LA Vol (A2C):   56.0 ml 31.94 ml/m RA Volume:   51.50 ml  29.38 ml/m LA Vol (A4C):   68.7 ml 39.19 ml/m LA Biplane Vol: 61.9 ml 35.31 ml/m  AORTIC VALVE AV Area (Vmax): 2.34 cm AV Vmax:        178.00 cm/s AV Peak Grad:   12.7 mmHg LVOT Vmax:      120.00 cm/s LVOT Vmean:     68.800 cm/s LVOT VTI:       0.181 m  AORTA Ao Root diam: 2.80 cm MITRAL VALVE                TRICUSPID VALVE MV Area (PHT): 10.25 cm    TR Peak grad:   48.2 mmHg MV Decel Time: 74 msec      TR Vmax:        347.00 cm/s MV E velocity: 143.00 cm/s MV A velocity: 45.30 cm/s  SHUNTS MV E/A ratio:  3.16         Systemic VTI:  0.18 m                             Systemic Diam: 2.10 cm Fransico Him MD Electronically signed by Fransico Him MD Signature Date/Time: 06/20/2020/12:17:09 PM    Final      Assessment and Plan:   1. Pericardiac effusion -Large pericardial effusion on CT of chest. -Echo with moderate to large pericardial effusion. The pericardial effusion is  circumferential. The effusion is largest posterior to the LV and apically  measuring 2.63cm at greatest diameter. THe IVC is dilated and plethoric  but there is no significant change  in MV inflow with respirations. The epigastric view is forshortened and  therefore cannot accurately assess for RV diastolic collapse.   2. SOB - progressively worsen in past 3 months - r/o pulmonary embolism  3. Elevated  troponin - Hs-troponin 44. No chest pain. Likely demand in setting of acute illness.   4. Hypokalemia - Supplement given by EDP  5.DM - Per admitting team   6. HTN - BP Elevated. Follow closely.   7. Chronic diastolic dysfunction - Echo with LVEF of 60 to 65% without regional  wall motion abnormalities. Left ventricular diastolic parameters are indeterminate. Elevated left ventricular  end-diastolic pressure.  Plan per Dr. Stanford Breed   For questions or updates, please contact [facility] Please consult [email or web address] for contact info under    Signed, Leanor Kail, PA  06/20/2020 12:25 PM   As above; pt seen and examined; briefly she is a 87 yo female with DM, hypertension, hyperlipidemia, remote h/o colon cancer for evaluation of pericardial effusion.  Apparently had viral syndrome in November with associated chills.  She subsequently has developed nonproductive cough which has persisted for several months.  She also notes increased dyspnea and worsening fatigue.  She denies fevers, hemoptysis, melena or hematochezia.  Recent CT showed large pericardial effusion and she was sent to the emergency room.  Note she has also had decreased appetite.  Physical exam shows loud pericardial rub.  CTA shows no pulmonary embolus.  There was evidence of pulmonary hypertension.  Small bilateral pleural effusions noted.  Potassium 2.7.  White blood cell count 9.3.  Electrocardiogram shows sinus rhythm, first-degree AV block, nonspecific ST changes, left axis deviation.  Echocardiogram personally reviewed and shows normal LV function.  There was a large pericardial effusion predominantly in the posterior lateral distribution. 1 pericardial effusion-I have personally reviewed the patient's pericardial fusion and it appears to be large but predominantly in the posterior lateral distribution.  This would be difficult to access percutaneously.  She also does not have tamponade on physical examination as  her systolic blood pressure is in the 150-170 range and heart rate in the 80-90 range.  It appears she may have had pericarditis from a viral syndrome in November and subsequent developed a pericardial fusion.  We will treat with anti-inflammatory (motrin 800 mg po TID); add colchicine 0.6 mg twice daily.  Follow clinically for development of tamponade.  We will repeat echocardiogram in 4 to 7 days.  2 hypertension-continue preadmission dose of losartan and Procardia.  Follow blood pressure and adjust medications as needed.  3 acute diastolic congestive heart failure-patient with minimal volume overload.  We will treat with Lasix 40 mg IV daily and follow.  4 hypokalemia-supplement.  Kirk Ruths, MD

## 2020-06-20 NOTE — Progress Notes (Signed)
   06/20/20 2000  Assess: MEWS Score  Temp 98.4 F (36.9 C)  BP 109/79  Pulse Rate 90  ECG Heart Rate 90  Resp (!) 31  SpO2 (!) 87 %  O2 Device Room Air  Assess: MEWS Score  MEWS Temp 0  MEWS Systolic 0  MEWS Pulse 0  MEWS RR 2  MEWS LOC 0  MEWS Score 2  MEWS Score Color Yellow  Assess: if the MEWS score is Yellow or Red  Were vital signs taken at a resting state? Yes  Focused Assessment No change from prior assessment  Early Detection of Sepsis Score *See Row Information* Low  MEWS guidelines implemented *See Row Information* Yes  Treat  MEWS Interventions Escalated (See documentation below)  Pain Scale 0-10  Take Vital Signs  Increase Vital Sign Frequency  Yellow: Q 2hr X 2 then Q 4hr X 2, if remains yellow, continue Q 4hrs  Notify: Charge Nurse/RN  Name of Charge Nurse/RN Notified Presenter, broadcasting  Date Charge Nurse/RN Notified 06/20/20  Time Charge Nurse/RN Notified 2102

## 2020-06-20 NOTE — ED Triage Notes (Signed)
BIB EMS from home. Pt has generalized lethargy and increased SOB for 2 weeks. Her PCP told her to come to ED as she has pleural effusion.  A&O x4 , on 3L [place] (room air baseline). VS 160/97, HR 95NSR, RR 18. 98% 3L. Pt very HOH.

## 2020-06-20 NOTE — Progress Notes (Signed)
  Echocardiogram 2D Echocardiogram has been performed.  Robin Mcclure 06/20/2020, 11:21 AM

## 2020-06-21 DIAGNOSIS — I301 Infective pericarditis: Principal | ICD-10-CM

## 2020-06-21 LAB — BASIC METABOLIC PANEL
Anion gap: 10 (ref 5–15)
BUN: 14 mg/dL (ref 8–23)
CO2: 26 mmol/L (ref 22–32)
Calcium: 9.9 mg/dL (ref 8.9–10.3)
Chloride: 106 mmol/L (ref 98–111)
Creatinine, Ser: 0.79 mg/dL (ref 0.44–1.00)
GFR, Estimated: >60 mL/min (ref >60)
Glucose, Bld: 182 mg/dL — ABNORMAL HIGH (ref 70–99)
Potassium: 3.6 mmol/L (ref 3.5–5.1)
Sodium: 142 mmol/L (ref 135–145)

## 2020-06-21 LAB — GLUCOSE, CAPILLARY
Glucose-Capillary: 152 mg/dL — ABNORMAL HIGH (ref 70–99)
Glucose-Capillary: 165 mg/dL — ABNORMAL HIGH (ref 70–99)
Glucose-Capillary: 174 mg/dL — ABNORMAL HIGH (ref 70–99)
Glucose-Capillary: 202 mg/dL — ABNORMAL HIGH (ref 70–99)

## 2020-06-21 MED ORDER — ORAL CARE MOUTH RINSE
15.0000 mL | Freq: Two times a day (BID) | OROMUCOSAL | Status: DC
  Administered 2020-06-21 – 2020-06-24 (×6): 15 mL via OROMUCOSAL

## 2020-06-21 MED ORDER — COLCHICINE 0.6 MG PO TABS
0.6000 mg | ORAL_TABLET | Freq: Every day | ORAL | Status: DC
  Administered 2020-06-21 – 2020-06-24 (×4): 0.6 mg via ORAL
  Filled 2020-06-21 (×4): qty 1

## 2020-06-21 MED ORDER — POTASSIUM CHLORIDE CRYS ER 20 MEQ PO TBCR: 40.0000 meq | EXTENDED_RELEASE_TABLET | Freq: Once | ORAL | Status: DC

## 2020-06-21 MED ORDER — ENOXAPARIN SODIUM 40 MG/0.4ML ~~LOC~~ SOLN
40.0000 mg | SUBCUTANEOUS | Status: DC
  Administered 2020-06-22: 40 mg via SUBCUTANEOUS
  Filled 2020-06-21: qty 0.4

## 2020-06-21 MED ORDER — POTASSIUM CHLORIDE CRYS ER 20 MEQ PO TBCR
20.0000 meq | EXTENDED_RELEASE_TABLET | Freq: Once | ORAL | Status: AC
  Administered 2020-06-21: 20 meq via ORAL
  Filled 2020-06-21: qty 1

## 2020-06-21 MED ORDER — HYDRALAZINE HCL 10 MG PO TABS
10.0000 mg | ORAL_TABLET | Freq: Three times a day (TID) | ORAL | Status: DC
  Administered 2020-06-21 – 2020-06-24 (×10): 10 mg via ORAL
  Filled 2020-06-21 (×10): qty 1

## 2020-06-21 NOTE — Plan of Care (Signed)

## 2020-06-21 NOTE — Evaluation (Signed)
Physical Therapy Evaluation Patient Details Name: Robin Mcclure MRN: [id number] DOB: 15-Jul-1922 Today's Date: 06/21/2020   History of Present Illness  Pt presented with dyspnea and found to have subacute pericarditis and large pericardial effusion. PMH - DM, peripheral neuropathy, HTN, colon CA, ckd.  Clinical Impression  Pt presents to PT with slightly unsteady gait due to illness and inactivity. Expect pt will make good progress back to baseline with mobility. Will follow acutely but doubt pt will need PT after DC.      Follow Up Recommendations No PT follow up;Supervision - Intermittent    Equipment Recommendations  None recommended by PT    Recommendations for Other Services       Precautions / Restrictions Precautions Precautions: Fall      Mobility  Bed Mobility Overal bed mobility: Needs Assistance Bed Mobility: Supine to Sit     Supine to sit: Min guard;HOB elevated     General bed mobility comments: Incr time and effort but no physical assist needed    Transfers Overall transfer level: Needs assistance Equipment used: Rolling walker (2 wheeled) Transfers: Sit to/from Stand Sit to Stand: Min guard         General transfer comment: Assist for lines/safety  Ambulation/Gait Ambulation/Gait assistance: Min guard Gait Distance (Feet): 30 Feet Assistive device: Rolling walker (2 wheeled) Gait Pattern/deviations: Step-through pattern;Decreased stride length;Trunk flexed Gait velocity: decr Gait velocity interpretation: <1.31 ft/sec, indicative of household ambulator General Gait Details: Assist for safety.  Stairs            Wheelchair Mobility    Modified Rankin (Stroke Patients Only)       Balance Overall balance assessment: Needs assistance Sitting-balance support: No upper extremity supported;Feet supported Sitting balance-Leahy Scale: Good     Standing balance support: Bilateral upper extremity supported Standing balance-Leahy Scale:  Poor Standing balance comment: walker and min guard for static standing                             Pertinent Vitals/Pain Pain Assessment: Faces Faces Pain Scale: No hurt    Home Living Family/patient expects to be discharged to:: Private residence Living Arrangements: Children Available Help at Discharge: Family;Available 24 hours/day Type of Home: House Home Access: Level entry     Home Layout: One level Home Equipment: Walker - 2 wheels;Transport chair      Prior Function Level of Independence: Needs assistance   Gait / Transfers Assistance Needed: modified independent short household distances           Hand Dominance        Extremity/Trunk Assessment   Upper Extremity Assessment Upper Extremity Assessment: Generalized weakness    Lower Extremity Assessment Lower Extremity Assessment: Generalized weakness       Communication   Communication: HOH  Cognition Arousal/Alertness: Awake/alert Behavior During Therapy: WFL for tasks assessed/performed Overall Cognitive Status: Within Functional Limits for tasks assessed                                        General Comments General comments (skin integrity, edema, etc.): On RA dyspnea 3/4, SpO2 89-90%. Resting SpO2 on 2L 97%.    Exercises     Assessment/Plan    PT Assessment Patient needs continued PT services  PT Problem List Decreased strength;Decreased activity tolerance;Decreased balance;Decreased mobility;Cardiopulmonary status limiting activity  PT Treatment Interventions DME instruction;Gait training;Functional mobility training;Therapeutic activities;Therapeutic exercise;Balance training;Patient/family education    PT Goals (Current goals can be found in the Care Plan section)  Acute Rehab PT Goals Patient Stated Goal: return home PT Goal Formulation: With patient Time For Goal Achievement: 07/05/20 Potential to Achieve Goals: Good    Frequency Min  3X/week   Barriers to discharge        Co-evaluation               AM-PAC PT "6 Clicks" Mobility  Outcome Measure Help needed turning from your back to your side while in a flat bed without using bedrails?: None Help needed moving from lying on your back to sitting on the side of a flat bed without using bedrails?: A Little Help needed moving to and from a bed to a chair (including a wheelchair)?: A Little Help needed standing up from a chair using your arms (e.g., wheelchair or bedside chair)?: A Little Help needed to walk in hospital room?: A Little Help needed climbing 3-5 steps with a railing? : A Little 6 Click Score: 19    End of Session Equipment Utilized During Treatment: Gait belt Activity Tolerance: Patient limited by fatigue (and dyspnea) Patient left: in chair;with call bell/phone within reach;with chair alarm set   PT Visit Diagnosis: Other abnormalities of gait and mobility (R26.89);Muscle weakness (generalized) (M62.81)    Time: 6226-3335 PT Time Calculation (min) (ACUTE ONLY): 20 min   Charges:   PT Evaluation $PT Eval Moderate Complexity: [place] Pager [phone] [place] 06/21/2020, 2:22 PM

## 2020-06-21 NOTE — Progress Notes (Signed)
PROGRESS NOTE  Devota Viruet  XKG:[id number] DOB: [birth year] DOA: 06/20/2020 PCP: Lujean Amel, MD   Brief Narrative: Robin Mcclure is a 87 y.o. female with a history of T2DM with peripheral neuropathy, HTN, HLD, colon CA s/p colectomy and chemotherapy in remission who presented to the ED 1/31 with progressive dyspnea. Symptoms began when she felt like she had a cold in late November, tested negative for covid twice with reassuring CXR. Infectious symptoms improved, though cough and dyspnea have continued prompting presentation to PCP who ordered CT chest. This showed large pericardial effusion and cardiomegaly, small left pleural effusion. She was contacted and referred to the ED where echocardiogram confirmed predominantly posterior and lateral pericardial effusion without vital sign evidence of tamponade. Cardiology was consulted, NSAID, colchicine, and IV lasix have been started with plans to repeat echocardiogram in a few days.   Assessment & Plan: Active Problems:   Pericarditis  Subacute pericarditis with large pericardial effusion: Post-viral most likely etiology. Size is large though not associated with tamponade physiology, and not amenable to pericardiocentesis at this time.  - Continue ibuprofen, colchicine per cardiology recommendations.  - Repeat echocardiogram in 48 hours - Continue lasix 40mg  IV daily per cardiology  Stage IIIb CKD: This is based on current available renal function testing. - With addition of NSAID and IV diuretic, will stop ARB at this time.  Demand myocardial ischemia: Mild troponin elevation without STEMI on ECG.  - Deferring work up currently per cardiology.  HTN:  - Replace ARB with hydralazine low dose.  - Continue nifedipine.  Hypokalemia: Improving. - Will repeat supplementation in face of ongoing loop diuretic use  Bacteriuria, pyuria: Pt with no symptoms or signs of UTI. This will be considered colonization and not treated at this  time.  Cough: Possibly related to pericardial process. Also possibly related to GERD.  - continue PPI, carafate and Tx as above  T2DM: HbA1c 7.3% indicating adequate control in 87yo F.  - SSI - Hold metformin, [facility]    DVT prophylaxis: Lovenox 30mg  q24h Code Status: Full, confirmed with patient today. Will continue discussions Family Communication: None at bedside Disposition Plan:  Status is: Inpatient  Remains inpatient appropriate because:Ongoing diagnostic testing needed not appropriate for outpatient work up and IV treatments appropriate due to intensity of illness or inability to take PO  Dispo: The patient is from: Home              Anticipated d/c is to: Home              Anticipated d/c date is: 2 days              Patient currently is not medically stable to d/c.   Difficult to place patient No  Consultants:   Cardiology  Procedures:   Echocardiogram 06/20/2020  Antimicrobials:  None   Subjective: Dyspnea is moderate, stable, worse with exertion. Cough is nonproductive, no chest pain or fever. Some leg swelling she reports slightly worse over past couple weeks.  Objective: Vitals:   06/21/20 0131 06/21/20 0212 06/21/20 0413 06/21/20 0700  BP: (!) 145/78  (!) 152/76 (!) 151/89  Pulse: 87  84 90  Resp: (!) 25  16 (!) 21  Temp:   97.8 F (36.6 C) 98 F (36.7 C)  TempSrc:   Oral Oral  SpO2: 92%  94% 99%  Weight:  69.6 kg    Height:  5\' 5"  (1.651 m)      Intake/Output Summary (Last 24 hours) at 06/21/2020  1047 Last data filed at 06/21/2020 [id number] Gross per 24 hour  Intake 100.1 ml  Output 200 ml  Net -99.9 ml   Filed Weights   06/21/20 0212  Weight: 69.6 kg    Gen: Frail elderly female in no distress Pulm: Non-labored breathing 2L, though no documented hypoxia. Clear to auscultation bilaterally.  CV: Regular rate and rhythm, occasional premature beat. +friction rub. No JVD, 1+ pitting pedal edema. GI: Abdomen soft, non-tender, non-distended, with  normoactive bowel sounds. No organomegaly or masses felt. Ext: Warm, no deformities Skin: No rashes, lesions or ulcers on visualized skin. RUE with ecchymosis. Neuro: Alert and oriented. No focal neurological deficits. Psych: Judgement and insight appear normal. Mood & affect appropriate.   Data Reviewed: I have personally reviewed following labs and imaging studies  CBC: Recent Labs  Lab 06/20/20 1030  WBC 9.3  NEUTROABS 7.1  HGB 10.3*  HCT 34.0*  MCV 89.5  PLT [id number]   Basic Metabolic Panel: Recent Labs  Lab 06/20/20 1030 06/20/20 1455 06/21/20 0315  NA 139  --  142  K 2.7*  --  3.6  CL 103  --  106  CO2 23  --  26  GLUCOSE 174*  --  182*  BUN 12  --  14  CREATININE 0.69  --  0.79  CALCIUM 10.1  --  9.9  MG  --  1.6*  --   PHOS  --  2.9  --    GFR: Estimated Creatinine Clearance: 41.2 mL/min (by C-G formula based on SCr of 0.79 mg/dL). Liver Function Tests: Recent Labs  Lab 06/20/20 1030  AST 18  ALT 18  ALKPHOS 83  BILITOT 0.9  PROT 6.5  ALBUMIN 2.5*   No results for input(s): LIPASE, AMYLASE in the last 168 hours. No results for input(s): AMMONIA in the last 168 hours. Coagulation Profile: No results for input(s): INR, PROTIME in the last 168 hours. Cardiac Enzymes: No results for input(s): CKTOTAL, CKMB, CKMBINDEX, TROPONINI in the last 168 hours. BNP (last 3 results) No results for input(s): PROBNP in the last 8760 hours. HbA1C: Recent Labs    06/20/20 2104  HGBA1C 7.3*   CBG: Recent Labs  Lab 06/20/20 1653 06/20/20 2313 06/21/20 0758  GLUCAP 148* 189* 152*   Lipid Profile: No results for input(s): CHOL, HDL, LDLCALC, TRIG, CHOLHDL, LDLDIRECT in the last 72 hours. Thyroid Function Tests: No results for input(s): TSH, T4TOTAL, FREET4, T3FREE, THYROIDAB in the last 72 hours. Anemia Panel: No results for input(s): VITAMINB12, FOLATE, FERRITIN, TIBC, IRON, RETICCTPCT in the last 72 hours. Urine analysis:    Component Value Date/Time    COLORURINE YELLOW 06/20/2020 1654   APPEARANCEUR CLEAR 06/20/2020 1654   LABSPEC 1.033 (H) 06/20/2020 1654   PHURINE 5.0 06/20/2020 1654   GLUCOSEU NEGATIVE 06/20/2020 1654   HGBUR NEGATIVE 06/20/2020 1654   BILIRUBINUR NEGATIVE 06/20/2020 1654   KETONESUR NEGATIVE 06/20/2020 1654   PROTEINUR NEGATIVE 06/20/2020 1654   NITRITE POSITIVE (A) 06/20/2020 1654   LEUKOCYTESUR MODERATE (A) 06/20/2020 1654   Recent Results (from the past 240 hour(s))  SARS Coronavirus 2 by RT PCR (hospital order, performed in [facility] hospital lab) Nasopharyngeal Nasopharyngeal Swab     Status: None   Collection Time: 06/20/20 10:30 AM   Specimen: Nasopharyngeal Swab  Result Value Ref Range Status   SARS Coronavirus 2 NEGATIVE NEGATIVE Final    Comment: (NOTE) SARS-CoV-2 target nucleic acids are NOT DETECTED.  The SARS-CoV-2 RNA is generally detectable in upper  and lower respiratory specimens during the acute phase of infection. The lowest concentration of SARS-CoV-2 viral copies this assay can detect is 250 copies / mL. A negative result does not preclude SARS-CoV-2 infection and should not be used as the sole basis for treatment or other patient management decisions.  A negative result may occur with improper specimen collection / handling, submission of specimen other than nasopharyngeal swab, presence of viral mutation(s) within the areas targeted by this assay, and inadequate number of viral copies (<250 copies / mL). A negative result must be combined with clinical observations, patient history, and epidemiological information.  Fact Sheet for Patients:   StrictlyIdeas.no  Fact Sheet for Healthcare Providers: BankingDealers.co.za  This test is not yet approved or  cleared by the [place] FDA and has been authorized for detection and/or diagnosis of SARS-CoV-2 by FDA under an Emergency Use Authorization (EUA).  This EUA will remain in effect  (meaning this test can be used) for the duration of the COVID-19 declaration under Section 564(b)(1) of the Act, 21 U.S.C. section 360bbb-3(b)(1), unless the authorization is terminated or revoked sooner.  Performed at [facility], [place] [address], [place],  [address]       Radiology Studies: CT Angio Chest PE W and/or Wo Contrast  Result Date: 06/20/2020 CLINICAL DATA:  Dyspnea, lethargy EXAM: CT ANGIOGRAPHY CHEST WITH CONTRAST TECHNIQUE: Multidetector CT imaging of the chest was performed using the standard protocol during bolus administration of intravenous contrast. Multiplanar CT image reconstructions and MIPs were obtained to evaluate the vascular anatomy. CONTRAST:  63mL OMNIPAQUE IOHEXOL 350 MG/ML SOLN COMPARISON:  06/14/2020 FINDINGS: Cardiovascular: There is adequate opacification of the pulmonary arterial tree. There is no intraluminal filling defect identified to suggest acute pulmonary embolism. The central pulmonary arteries are enlarged, similar to that noted on prior examination, in keeping with changes of pulmonary arterial hypertension. Extensive multi-vessel coronary artery calcification again noted. Global cardiac size within normal limits. Large pericardial effusion is again seen, unchanged from prior examination. Moderate atherosclerotic calcification of the thoracic aorta again identified. No aortic aneurysm. Mediastinum/Nodes: No enlarged mediastinal, hilar, or axillary lymph nodes. Thyroid gland, trachea, and esophagus demonstrate no significant findings. Lungs/Pleura: Small bilateral pleural effusions are present, new since prior examination. Mild bibasilar associated compressive atelectasis. No superimposed confluent pulmonary infiltrate. No pneumothorax. Central airways are widely patent. Upper Abdomen: No acute abnormality. Musculoskeletal: Anterior wedge compression fractures of T4 and T5 are again seen, chronic in nature, with resultant focal thoracic  kyphosis. Review of the MIP images confirms the above findings. IMPRESSION: No pulmonary embolism. Extensive multi-vessel coronary artery calcification. Morphologic changes of pulmonary arterial hypertension. Stable large pericardial effusion. Interval development of small bilateral pleural effusions. Aortic Atherosclerosis (ICD10-I70.0). Electronically Signed   By: Fidela Salisbury MD   On: 06/20/2020 13:01   DG Chest Portable 1 View  Result Date: 06/20/2020 CLINICAL DATA:  Shortness of breath. EXAM: PORTABLE CHEST 1 VIEW COMPARISON:  April 19, 2020. FINDINGS: Stable cardiomegaly. No pneumothorax or pleural effusion is noted. Lungs are clear. Bony thorax is unremarkable. IMPRESSION: No active disease. Electronically Signed   By: Marijo Conception M.D.   On: 06/20/2020 10:56   ECHOCARDIOGRAM COMPLETE  Result Date: 06/20/2020    ECHOCARDIOGRAM REPORT   Patient Name:   BRIELLA HOBDAY Date of Exam: 06/20/2020 Medical Rec #:  [id number]     Height:       63.0 in Accession #:    [id number]    Weight:  158.8 lb Date of Birth:  1923-02-01      BSA:          1.753 m Patient Age:    89 years      BP:           162/79 mmHg Patient Gender: F             HR:           88 bpm. Exam Location:  Inpatient Procedure: 2D Echo, Cardiac Doppler and Color Doppler STAT ECHO Indications:    Pericardial effusion  History:        Patient has no prior history of Echocardiogram examinations.                 Signs/Symptoms:Shortness of Breath and Pleural effusion.  Sonographer:    Dustin Flock Referring Phys: [id number] ADAM CURATOLO IMPRESSIONS  1. Left ventricular ejection fraction, by estimation, is 60 to 65%. The left ventricle has normal function. The left ventricle has no regional wall motion abnormalities. Left ventricular diastolic parameters are indeterminate. Elevated left ventricular end-diastolic pressure.  2. Right ventricular systolic function is normal. The right ventricular size is normal. There is moderately elevated  pulmonary artery systolic pressure.  3. Left atrial size was mildly dilated.  4. The mitral valve is normal in structure. Trivial mitral valve regurgitation. No evidence of mitral stenosis.  5. The aortic valve is tricuspid. There is moderate calcification of the aortic valve. There is moderate thickening of the aortic valve. Aortic valve regurgitation is not visualized. Mild to moderate aortic valve sclerosis/calcification is present, without any evidence of aortic stenosis.  6. The inferior vena cava is normal in size with greater than 50% respiratory variability, suggesting right atrial pressure of 3 mmHg.  7. Moderate to large pericardial effusion. The pericardial effusion is circumferential. The effusion is largest posterior to the LV and apically measuring 2.63cm at greatest diameter. THe IVC is dilated and plethoric but there is no significant change in MV inflow with respirations. The epigastric view is forshortened and therefore cannot accurately assess for RV diastolic collapse.  8. No prior echo to compare regarding pericardial effusion. FINDINGS  Left Ventricle: Left ventricular ejection fraction, by estimation, is 60 to 65%. The left ventricle has normal function. The left ventricle has no regional wall motion abnormalities. The left ventricular internal cavity size was normal in size. There is  no left ventricular hypertrophy. Left ventricular diastolic parameters are indeterminate. Elevated left ventricular end-diastolic pressure. Right Ventricle: The right ventricular size is normal. No increase in right ventricular wall thickness. Right ventricular systolic function is normal. There is moderately elevated pulmonary artery systolic pressure. The tricuspid regurgitant velocity is 3.47 m/s, and with an assumed right atrial pressure of 3 mmHg, the estimated right ventricular systolic pressure is 0000000 mmHg. Left Atrium: Left atrial size was mildly dilated. Right Atrium: Right atrial size was normal in  size. Pericardium: The effusion is largest posterior to the LV and apically measuring 2.63cm at greatest diameter. THe IVC is dilated and plethoric but there is no significant change in MV inflow with respirations. The epigastric view is forshortened and therefore cannot accurately assess for RV diastolic collapse. A large pericardial effusion is present. The pericardial effusion is circumferential. Mitral Valve: The mitral valve is normal in structure. Mild to moderate mitral annular calcification. Trivial mitral valve regurgitation. No evidence of mitral valve stenosis. Tricuspid Valve: The tricuspid valve is normal in structure. Tricuspid valve regurgitation is mild . No evidence  of tricuspid stenosis. Aortic Valve: The aortic valve is tricuspid. There is moderate calcification of the aortic valve. There is moderate thickening of the aortic valve. Aortic valve regurgitation is not visualized. Mild to moderate aortic valve sclerosis/calcification is present, without any evidence of aortic stenosis. Aortic valve peak gradient measures 12.7 mmHg. Pulmonic Valve: The pulmonic valve was normal in structure. Pulmonic valve regurgitation is trivial. No evidence of pulmonic stenosis. Aorta: The aortic root is normal in size and structure. Venous: The inferior vena cava is normal in size with greater than 50% respiratory variability, suggesting right atrial pressure of 3 mmHg. IAS/Shunts: No atrial level shunt detected by color flow Doppler. Additional Comments: There is a small pleural effusion in the left lateral region.  LEFT VENTRICLE PLAX 2D LVIDd:         3.50 cm  Diastology LVIDs:         2.40 cm  LV e' medial:    5.44 cm/s LV PW:         1.10 cm  LV E/e' medial:  26.3 LV IVS:        1.10 cm  LV e' lateral:   9.14 cm/s LVOT diam:     2.10 cm  LV E/e' lateral: 15.6 LV SV:         63 LV SV Index:   36 LVOT Area:     3.46 cm  RIGHT VENTRICLE RV Basal diam:  3.20 cm RV S prime:     12.20 cm/s TAPSE (M-mode): 3.0 cm  LEFT ATRIUM             Index       RIGHT ATRIUM           Index LA diam:        3.60 cm 2.05 cm/m  RA Area:     17.50 cm LA Vol (A2C):   56.0 ml 31.94 ml/m RA Volume:   51.50 ml  29.38 ml/m LA Vol (A4C):   68.7 ml 39.19 ml/m LA Biplane Vol: 61.9 ml 35.31 ml/m  AORTIC VALVE AV Area (Vmax): 2.34 cm AV Vmax:        178.00 cm/s AV Peak Grad:   12.7 mmHg LVOT Vmax:      120.00 cm/s LVOT Vmean:     68.800 cm/s LVOT VTI:       0.181 m  AORTA Ao Root diam: 2.80 cm MITRAL VALVE                TRICUSPID VALVE MV Area (PHT): 10.25 cm    TR Peak grad:   48.2 mmHg MV Decel Time: 74 msec      TR Vmax:        347.00 cm/s MV E velocity: 143.00 cm/s MV A velocity: 45.30 cm/s   SHUNTS MV E/A ratio:  3.16         Systemic VTI:  0.18 m                             Systemic Diam: 2.10 cm Fransico Him MD Electronically signed by Fransico Him MD Signature Date/Time: 06/20/2020/12:17:09 PM    Final     Scheduled Meds: . colchicine  0.6 mg Oral Daily  . enoxaparin (LOVENOX) injection  30 mg Subcutaneous Q24H  . fluticasone furoate-vilanterol  1 puff Inhalation Daily  . furosemide  40 mg Intravenous Daily  . ibuprofen  800 mg Oral TID  . insulin aspart  0-9 Units Subcutaneous TID WC  . linagliptin  5 mg Oral Daily  . mouth rinse  15 mL Mouth Rinse BID  . multivitamin  1 tablet Oral Daily  . NIFEdipine  90 mg Oral Daily  . pantoprazole  40 mg Oral Daily  . pravastatin  20 mg Oral Daily  . sodium chloride flush  3 mL Intravenous Q12H  . sucralfate  1 g Oral TID WC & HS   Continuous Infusions: . sodium chloride       LOS: 1 day   Time spent: 35 minutes.  Patrecia Pour, MD [facility] [email or web address] 06/21/2020, 10:47 AM

## 2020-06-21 NOTE — Progress Notes (Signed)
Progress Note  Patient Name: Robin Mcclure Date of Encounter: 06/21/2020  [facility] Cardiologist: New  Subjective   Continues with cough but improved; no CP; mild dyspnea but also improved  Inpatient Medications    Scheduled Meds: . colchicine  0.6 mg Oral BID  . enoxaparin (LOVENOX) injection  30 mg Subcutaneous Q24H  . fluticasone furoate-vilanterol  1 puff Inhalation Daily  . furosemide  40 mg Intravenous Daily  . ibuprofen  800 mg Oral TID  . insulin aspart  0-9 Units Subcutaneous TID WC  . linagliptin  5 mg Oral Daily  . mouth rinse  15 mL Mouth Rinse BID  . multivitamin  1 tablet Oral Daily  . NIFEdipine  90 mg Oral Daily  . pantoprazole  40 mg Oral Daily  . pravastatin  20 mg Oral Daily  . sodium chloride flush  3 mL Intravenous Q12H  . sucralfate  1 g Oral TID WC & HS   Continuous Infusions: . sodium chloride     PRN Meds: sodium chloride, acetaminophen, albuterol, labetalol, ondansetron (ZOFRAN) IV, sodium chloride flush   Vital Signs    Vitals:   06/21/20 0001 06/21/20 0131 06/21/20 0212 06/21/20 0413  BP: 125/63 (!) 145/78  (!) 152/76  Pulse: 83 87  84  Resp: 16 (!) 25  16  Temp: 97.9 F (36.6 C)   97.8 F (36.6 C)  TempSrc: Oral   Oral  SpO2: 93% 92%  94%  Weight:   69.6 kg   Height:   5\' 5"  (1.651 m)     Intake/Output Summary (Last 24 hours) at 06/21/2020 0736 Last data filed at 06/21/2020 2025 Gross per 24 hour  Intake 100.1 ml  Output 200 ml  Net -99.9 ml   Last 3 Weights 06/21/2020 01/19/2018  Weight (lbs) 153 lb 7 oz 158 lb 12.8 oz  Weight (kg) 69.6 kg 72.031 kg      Telemetry    Sinus with pacs and pvcs- Personally Reviewed  Physical Exam   GEN: No acute distress.   Neck: No JVD Cardiac: RRR, positive rub Respiratory: Clear to auscultation bilaterally. GI: Soft, nontender, non-distended  MS: No edema Neuro:  Nonfocal  Psych: Normal affect   Labs    High Sensitivity Troponin:   Recent Labs  Lab 06/20/20 1030  06/20/20 1455  TROPONINIHS 44* 49*      Chemistry Recent Labs  Lab 06/20/20 1030 06/21/20 0315  NA 139 142  K 2.7* 3.6  CL 103 106  CO2 23 26  GLUCOSE 174* 182*  BUN 12 14  CREATININE 0.69 0.79  CALCIUM 10.1 9.9  PROT 6.5  --   ALBUMIN 2.5*  --   AST 18  --   ALT 18  --   ALKPHOS 83  --   BILITOT 0.9  --   GFRNONAA >60 >60  ANIONGAP 13 10     Hematology Recent Labs  Lab 06/20/20 1030  WBC 9.3  RBC 3.80*  HGB 10.3*  HCT 34.0*  MCV 89.5  MCH 27.1  MCHC 30.3  RDW 15.2  PLT 400    BNP Recent Labs  Lab 06/20/20 1030  BNP 277.0*    Radiology    CT Angio Chest PE W and/or Wo Contrast  Result Date: 06/20/2020 CLINICAL DATA:  Dyspnea, lethargy EXAM: CT ANGIOGRAPHY CHEST WITH CONTRAST TECHNIQUE: Multidetector CT imaging of the chest was performed using the standard protocol during bolus administration of intravenous contrast. Multiplanar CT image reconstructions and MIPs were  obtained to evaluate the vascular anatomy. CONTRAST:  40mL OMNIPAQUE IOHEXOL 350 MG/ML SOLN COMPARISON:  06/14/2020 FINDINGS: Cardiovascular: There is adequate opacification of the pulmonary arterial tree. There is no intraluminal filling defect identified to suggest acute pulmonary embolism. The central pulmonary arteries are enlarged, similar to that noted on prior examination, in keeping with changes of pulmonary arterial hypertension. Extensive multi-vessel coronary artery calcification again noted. Global cardiac size within normal limits. Large pericardial effusion is again seen, unchanged from prior examination. Moderate atherosclerotic calcification of the thoracic aorta again identified. No aortic aneurysm. Mediastinum/Nodes: No enlarged mediastinal, hilar, or axillary lymph nodes. Thyroid gland, trachea, and esophagus demonstrate no significant findings. Lungs/Pleura: Small bilateral pleural effusions are present, new since prior examination. Mild bibasilar associated compressive  atelectasis. No superimposed confluent pulmonary infiltrate. No pneumothorax. Central airways are widely patent. Upper Abdomen: No acute abnormality. Musculoskeletal: Anterior wedge compression fractures of T4 and T5 are again seen, chronic in nature, with resultant focal thoracic kyphosis. Review of the MIP images confirms the above findings. IMPRESSION: No pulmonary embolism. Extensive multi-vessel coronary artery calcification. Morphologic changes of pulmonary arterial hypertension. Stable large pericardial effusion. Interval development of small bilateral pleural effusions. Aortic Atherosclerosis (ICD10-I70.0). Electronically Signed   By: Fidela Salisbury MD   On: 06/20/2020 13:01   DG Chest Portable 1 View  Result Date: 06/20/2020 CLINICAL DATA:  Shortness of breath. EXAM: PORTABLE CHEST 1 VIEW COMPARISON:  April 19, 2020. FINDINGS: Stable cardiomegaly. No pneumothorax or pleural effusion is noted. Lungs are clear. Bony thorax is unremarkable. IMPRESSION: No active disease. Electronically Signed   By: Marijo Conception M.D.   On: 06/20/2020 10:56   ECHOCARDIOGRAM COMPLETE  Result Date: 06/20/2020    ECHOCARDIOGRAM REPORT   Patient Name:   Robin Mcclure Date of Exam: 06/20/2020 Medical Rec #:  [id number]     Height:       63.0 in Accession #:    [id number]    Weight:       158.8 lb Date of Birth:  1923/04/19      BSA:          1.753 m Patient Age:    87 years      BP:           162/79 mmHg Patient Gender: F             HR:           88 bpm. Exam Location:  Inpatient Procedure: 2D Echo, Cardiac Doppler and Color Doppler STAT ECHO Indications:    Pericardial effusion  History:        Patient has no prior history of Echocardiogram examinations.                 Signs/Symptoms:Shortness of Breath and Pleural effusion.  Sonographer:    Dustin Flock Referring Phys: [id number] ADAM CURATOLO IMPRESSIONS  1. Left ventricular ejection fraction, by estimation, is 60 to 65%. The left ventricle has normal function. The  left ventricle has no regional wall motion abnormalities. Left ventricular diastolic parameters are indeterminate. Elevated left ventricular end-diastolic pressure.  2. Right ventricular systolic function is normal. The right ventricular size is normal. There is moderately elevated pulmonary artery systolic pressure.  3. Left atrial size was mildly dilated.  4. The mitral valve is normal in structure. Trivial mitral valve regurgitation. No evidence of mitral stenosis.  5. The aortic valve is tricuspid. There is moderate calcification of the aortic valve. There is moderate thickening of the aortic  valve. Aortic valve regurgitation is not visualized. Mild to moderate aortic valve sclerosis/calcification is present, without any evidence of aortic stenosis.  6. The inferior vena cava is normal in size with greater than 50% respiratory variability, suggesting right atrial pressure of 3 mmHg.  7. Moderate to large pericardial effusion. The pericardial effusion is circumferential. The effusion is largest posterior to the LV and apically measuring 2.63cm at greatest diameter. THe IVC is dilated and plethoric but there is no significant change in MV inflow with respirations. The epigastric view is forshortened and therefore cannot accurately assess for RV diastolic collapse.  8. No prior echo to compare regarding pericardial effusion. FINDINGS  Left Ventricle: Left ventricular ejection fraction, by estimation, is 60 to 65%. The left ventricle has normal function. The left ventricle has no regional wall motion abnormalities. The left ventricular internal cavity size was normal in size. There is  no left ventricular hypertrophy. Left ventricular diastolic parameters are indeterminate. Elevated left ventricular end-diastolic pressure. Right Ventricle: The right ventricular size is normal. No increase in right ventricular wall thickness. Right ventricular systolic function is normal. There is moderately elevated pulmonary artery  systolic pressure. The tricuspid regurgitant velocity is 3.47 m/s, and with an assumed right atrial pressure of 3 mmHg, the estimated right ventricular systolic pressure is 0000000 mmHg. Left Atrium: Left atrial size was mildly dilated. Right Atrium: Right atrial size was normal in size. Pericardium: The effusion is largest posterior to the LV and apically measuring 2.63cm at greatest diameter. THe IVC is dilated and plethoric but there is no significant change in MV inflow with respirations. The epigastric view is forshortened and therefore cannot accurately assess for RV diastolic collapse. A large pericardial effusion is present. The pericardial effusion is circumferential. Mitral Valve: The mitral valve is normal in structure. Mild to moderate mitral annular calcification. Trivial mitral valve regurgitation. No evidence of mitral valve stenosis. Tricuspid Valve: The tricuspid valve is normal in structure. Tricuspid valve regurgitation is mild . No evidence of tricuspid stenosis. Aortic Valve: The aortic valve is tricuspid. There is moderate calcification of the aortic valve. There is moderate thickening of the aortic valve. Aortic valve regurgitation is not visualized. Mild to moderate aortic valve sclerosis/calcification is present, without any evidence of aortic stenosis. Aortic valve peak gradient measures 12.7 mmHg. Pulmonic Valve: The pulmonic valve was normal in structure. Pulmonic valve regurgitation is trivial. No evidence of pulmonic stenosis. Aorta: The aortic root is normal in size and structure. Venous: The inferior vena cava is normal in size with greater than 50% respiratory variability, suggesting right atrial pressure of 3 mmHg. IAS/Shunts: No atrial level shunt detected by color flow Doppler. Additional Comments: There is a small pleural effusion in the left lateral region.  LEFT VENTRICLE PLAX 2D LVIDd:         3.50 cm  Diastology LVIDs:         2.40 cm  LV e' medial:    5.44 cm/s LV PW:          1.10 cm  LV E/e' medial:  26.3 LV IVS:        1.10 cm  LV e' lateral:   9.14 cm/s LVOT diam:     2.10 cm  LV E/e' lateral: 15.6 LV SV:         63 LV SV Index:   36 LVOT Area:     3.46 cm  RIGHT VENTRICLE RV Basal diam:  3.20 cm RV S prime:     12.20  cm/s TAPSE (M-mode): 3.0 cm LEFT ATRIUM             Index       RIGHT ATRIUM           Index LA diam:        3.60 cm 2.05 cm/m  RA Area:     17.50 cm LA Vol (A2C):   56.0 ml 31.94 ml/m RA Volume:   51.50 ml  29.38 ml/m LA Vol (A4C):   68.7 ml 39.19 ml/m LA Biplane Vol: 61.9 ml 35.31 ml/m  AORTIC VALVE AV Area (Vmax): 2.34 cm AV Vmax:        178.00 cm/s AV Peak Grad:   12.7 mmHg LVOT Vmax:      120.00 cm/s LVOT Vmean:     68.800 cm/s LVOT VTI:       0.181 m  AORTA Ao Root diam: 2.80 cm MITRAL VALVE                TRICUSPID VALVE MV Area (PHT): 10.25 cm    TR Peak grad:   48.2 mmHg MV Decel Time: 74 msec      TR Vmax:        347.00 cm/s MV E velocity: 143.00 cm/s MV A velocity: 45.30 cm/s   SHUNTS MV E/A ratio:  3.16         Systemic VTI:  0.18 m                             Systemic Diam: 2.10 cm Fransico Him MD Electronically signed by Fransico Him MD Signature Date/Time: 06/20/2020/12:17:09 PM    Final      Patient Profile   87 yo female with DM, hypertension, hyperlipidemia, remote h/o colon cancer for evaluation of pericardial effusion.  Apparently had viral syndrome in November with associated chills.  She subsequently has developed nonproductive cough which has persisted for several months.  She also notes increased dyspnea and worsening fatigue. Echocardiogram personally reviewed and shows normal LV function.  There was a large pericardial effusion predominantly in the posterior lateral distribution.  Assessment & Plan    1 pericarditis/pericardial effusion-patient likely has pericarditis from viral syndrome that she developed in November and subsequently developed pericardial effusion.  I previously reviewed the patient's echocardiogram and she  has a large effusion predominantly in the posterior lateral distribution.  However clinically she is not in tamponade.  We will continue anti-inflammatory therapy with Motrin and she is also on colchicine.  We will likely repeat her echocardiogram in 48 hours to follow effusion.  2 hypertension-blood pressure has improved with reinitiation of home medications.  Continue Procardia.  3 acute diastolic congestive heart failure-symptoms have improved with low-dose diuretic.  Continue Lasix 40 mg IV daily and follow.  4 hypokalemia-improved this AM.   For questions or updates, please contact [place] Please consult [email or web address] for contact info under        Signed, Kirk Ruths, MD  06/21/2020, 7:36 AM

## 2020-06-22 ENCOUNTER — Other Ambulatory Visit: Payer: Self-pay

## 2020-06-22 DIAGNOSIS — I300 Acute nonspecific idiopathic pericarditis: Secondary | ICD-10-CM

## 2020-06-22 LAB — BASIC METABOLIC PANEL
Anion gap: 12 (ref 5–15)
BUN: 23 mg/dL (ref 8–23)
CO2: 23 mmol/L (ref 22–32)
Calcium: 10 mg/dL (ref 8.9–10.3)
Chloride: 106 mmol/L (ref 98–111)
Creatinine, Ser: 1.13 mg/dL — ABNORMAL HIGH (ref 0.44–1.00)
GFR, Estimated: 45 mL/min — ABNORMAL LOW (ref >60)
Glucose, Bld: 228 mg/dL — ABNORMAL HIGH (ref 70–99)
Potassium: 3.7 mmol/L (ref 3.5–5.1)
Sodium: 141 mmol/L (ref 135–145)

## 2020-06-22 LAB — COXSACKIE A VIRUS ANTIBODIES
Coxsackie A16 IgG: NEGATIVE titer
Coxsackie A16 IgM: NEGATIVE titer
Coxsackie A24 IgG: NEGATIVE titer
Coxsackie A24 IgM: NEGATIVE titer
Coxsackie A7 IgG: NEGATIVE titer
Coxsackie A7 IgM: NEGATIVE titer
Coxsackie A9 IgG: NEGATIVE titer
Coxsackie A9 IgM: NEGATIVE titer

## 2020-06-22 LAB — GLUCOSE, CAPILLARY
Glucose-Capillary: 178 mg/dL — ABNORMAL HIGH (ref 70–99)
Glucose-Capillary: 184 mg/dL — ABNORMAL HIGH (ref 70–99)
Glucose-Capillary: 154 mg/dL — ABNORMAL HIGH (ref 70–99)
Glucose-Capillary: 211 mg/dL — ABNORMAL HIGH (ref 70–99)

## 2020-06-22 LAB — MAGNESIUM: Magnesium: 1.5 mg/dL — ABNORMAL LOW (ref 1.7–2.4)

## 2020-06-22 MED ORDER — DEXTROMETHORPHAN POLISTIREX ER 30 MG/5ML PO SUER
15.0000 mg | Freq: Four times a day (QID) | ORAL | Status: DC | PRN
  Administered 2020-06-23 – 2020-06-24 (×2): 15 mg via ORAL
  Filled 2020-06-22 (×3): qty 5

## 2020-06-22 MED ORDER — FUROSEMIDE 40 MG PO TABS
40.0000 mg | ORAL_TABLET | Freq: Every day | ORAL | Status: DC
  Administered 2020-06-22: 40 mg via ORAL
  Filled 2020-06-22: qty 1

## 2020-06-22 MED ORDER — MAGNESIUM SULFATE 2 GM/50ML IV SOLN
2.0000 g | Freq: Once | INTRAVENOUS | Status: AC
  Administered 2020-06-22: 2 g via INTRAVENOUS
  Filled 2020-06-22: qty 50

## 2020-06-22 MED ORDER — ENOXAPARIN SODIUM 30 MG/0.3ML ~~LOC~~ SOLN
30.0000 mg | SUBCUTANEOUS | Status: DC
  Administered 2020-06-23 – 2020-06-24 (×2): 30 mg via SUBCUTANEOUS
  Filled 2020-06-22 (×2): qty 0.3

## 2020-06-22 MED ORDER — POTASSIUM CHLORIDE CRYS ER 20 MEQ PO TBCR
20.0000 meq | EXTENDED_RELEASE_TABLET | Freq: Every day | ORAL | Status: DC
  Administered 2020-06-22: 20 meq via ORAL
  Filled 2020-06-22: qty 1

## 2020-06-22 NOTE — Progress Notes (Signed)
PROGRESS NOTE  Robin Mcclure  [id number] DOB: 12-14-1922 DOA: 06/20/2020 PCP: Lujean Amel, MD   Brief Narrative: Robin Mcclure is a 87 y.o. female with a history of T2DM with peripheral neuropathy, HTN, HLD, colon CA s/p colectomy and chemotherapy in remission who presented to the ED 1/31 with progressive dyspnea. Symptoms began when she felt like she had a cold in late November, tested negative for covid twice with reassuring CXR. Infectious symptoms improved, though cough and dyspnea have continued prompting presentation to PCP who ordered CT chest. This showed large pericardial effusion and cardiomegaly, small left pleural effusion. She was contacted and referred to the ED where echocardiogram confirmed predominantly posterior and lateral pericardial effusion without vital sign evidence of tamponade. Cardiology was consulted, NSAID, colchicine, and IV lasix have been started with plans to repeat echocardiogram in a few days.   Assessment & Plan: Active Problems:   Pericarditis  Subacute pericarditis with large pericardial effusion: Post-viral most likely etiology. Size is large though not associated with tamponade physiology, and not amenable to pericardiocentesis at this time.  - Continue ibuprofen, colchicine per cardiology recommendations.  - Repeat echocardiogram 06/23/2020. - Change to po lasix  AKI on stage IIIb CKD: This is based on current available renal function testing. SCr up with IV diuresis which will be deescalated as above - With addition of NSAID and IV diuretic, stopped ARB.  Demand myocardial ischemia: Mild troponin elevation without STEMI on ECG.  - Deferring work up currently per cardiology.  HTN:  - Replaced ARB with hydralazine low dose. BP now at goal. - Continue nifedipine.  Hypokalemia: Improving. - Continue supplementation with ongoing loop diuretic use  Hypomagnesemia:  - Supplement and monitor  Bacteriuria, pyuria: Pt with no symptoms or signs of  UTI. This will be considered colonization and not treated at this time.  Cough: Possibly related to pericardial process. Also possibly related to GERD.  - Continue PPI, carafate and Tx as above - Add dextromethorphan.  T2DM: HbA1c 7.3% indicating adequate control in 87yo F.  - SSI, linagliptin. - Hold metformin, [facility]    DVT prophylaxis: Lovenox 30mg  q24h Code Status: Full, confirmed with patient today. Will continue discussions Family Communication: None at bedside Disposition Plan:  Status is: Inpatient  Remains inpatient appropriate because:Ongoing diagnostic testing needed not appropriate for outpatient work up and IV treatments appropriate due to intensity of illness or inability to take PO  Dispo: The patient is from: Home              Anticipated d/c is to: Home              Anticipated d/c date is: 2 days              Patient currently is not medically stable to d/c.   Difficult to place patient No  Consultants:   Cardiology  Procedures:   Echocardiogram 06/20/2020  Antimicrobials:  None   Subjective: No new complaints, feels less dyspneic than at admission, got up with PT yesterday and feels at about her baseline mobility-wise. No chest pain. Still has a nonproductive cough.  Objective: Vitals:   06/22/20 0432 06/22/20 0500 06/22/20 0648 06/22/20 0700  BP: 137/78  139/80 127/77  Pulse: 76   86  Resp: 20   20  Temp: 97.7 F (36.5 C)   98.3 F (36.8 C)  TempSrc: Oral   Oral  SpO2: 94%   96%  Weight:  68.1 kg    Height:  Intake/Output Summary (Last 24 hours) at 06/22/2020 0958 Last data filed at 06/22/2020 0432 Gross per 24 hour  Intake -  Output 400 ml  Net -400 ml   Filed Weights   06/21/20 0212 06/22/20 0500  Weight: 69.6 kg 68.1 kg   Gen: Frail, elderly, alert female in no distress Pulm: Nonlabored breathing supplemental oxygen. Clear. CV: Regular rate and rhythm. Systolic murmur and rub, no gallop. No JVD, no pitting dependent edema. GI:  Abdomen soft, non-tender, non-distended, with normoactive bowel sounds.  Ext: Warm, no deformities Skin: No new rashes, lesions or ulcers on visualized skin. Ecchymoses on upper arms where BP cuff has been. IV site c/d/i. Neuro: Alert and oriented. No focal neurological deficits. Psych: Judgement and insight appear fair. Mood euthymic & affect congruent. Behavior is appropriate.    Data Reviewed: I have personally reviewed following labs and imaging studies  CBC: Recent Labs  Lab 06/20/20 1030  WBC 9.3  NEUTROABS 7.1  HGB 10.3*  HCT 34.0*  MCV 89.5  PLT 109   Basic Metabolic Panel: Recent Labs  Lab 06/20/20 1030 06/20/20 1455 06/21/20 0315 06/22/20 0304  NA 139  --  142 141  K 2.7*  --  3.6 3.7  CL 103  --  106 106  CO2 23  --  26 23  GLUCOSE 174*  --  182* 228*  BUN 12  --  14 23  CREATININE 0.69  --  0.79 1.13*  CALCIUM 10.1  --  9.9 10.0  MG  --  1.6*  --  1.5*  PHOS  --  2.9  --   --    GFR: Estimated Creatinine Clearance: 26.8 mL/min (A) (by C-G formula based on SCr of 1.13 mg/dL (H)). Liver Function Tests: Recent Labs  Lab 06/20/20 1030  AST 18  ALT 18  ALKPHOS 83  BILITOT 0.9  PROT 6.5  ALBUMIN 2.5*   HbA1C: Recent Labs    06/20/20 2104  HGBA1C 7.3*   CBG: Recent Labs  Lab 06/21/20 0758 06/21/20 1228 06/21/20 1723 06/21/20 2202 06/22/20 0756  GLUCAP 152* 165* 174* 202* 178*   Urine analysis:    Component Value Date/Time   COLORURINE YELLOW 06/20/2020 1654   APPEARANCEUR CLEAR 06/20/2020 1654   LABSPEC 1.033 (H) 06/20/2020 1654   PHURINE 5.0 06/20/2020 1654   GLUCOSEU NEGATIVE 06/20/2020 1654   HGBUR NEGATIVE 06/20/2020 1654   BILIRUBINUR NEGATIVE 06/20/2020 1654   KETONESUR NEGATIVE 06/20/2020 1654   PROTEINUR NEGATIVE 06/20/2020 1654   NITRITE POSITIVE (A) 06/20/2020 1654   LEUKOCYTESUR MODERATE (A) 06/20/2020 1654   Recent Results (from the past 240 hour(s))  SARS Coronavirus 2 by RT PCR (hospital order, performed in [place] hospital lab) Nasopharyngeal Nasopharyngeal Swab     Status: None   Collection Time: 06/20/20 10:30 AM   Specimen: Nasopharyngeal Swab  Result Value Ref Range Status   SARS Coronavirus 2 NEGATIVE NEGATIVE Final    Comment: (NOTE) SARS-CoV-2 target nucleic acids are NOT DETECTED.  The SARS-CoV-2 RNA is generally detectable in upper and lower respiratory specimens during the acute phase of infection. The lowest concentration of SARS-CoV-2 viral copies this assay can detect is 250 copies / mL. A negative result does not preclude SARS-CoV-2 infection and should not be used as the sole basis for treatment or other patient management decisions.  A negative result may occur with improper specimen collection / handling, submission of specimen other than nasopharyngeal swab, presence of viral mutation(s) within the areas targeted  by this assay, and inadequate number of viral copies (<250 copies / mL). A negative result must be combined with clinical observations, patient history, and epidemiological information.  Fact Sheet for Patients:   StrictlyIdeas.no  Fact Sheet for Healthcare Providers: BankingDealers.co.za  This test is not yet approved or  cleared by the [place] FDA and has been authorized for detection and/or diagnosis of SARS-CoV-2 by FDA under an Emergency Use Authorization (EUA).  This EUA will remain in effect (meaning this test can be used) for the duration of the COVID-19 declaration under Section 564(b)(1) of the Act, 21 U.S.C. section 360bbb-3(b)(1), unless the authorization is terminated or revoked sooner.  Performed at [facility], [place] [address], [place], [place] [address]       Radiology Studies: CT Angio Chest PE W and/or Wo Contrast  Result Date: 06/20/2020 CLINICAL DATA:  Dyspnea, lethargy EXAM: CT ANGIOGRAPHY CHEST WITH CONTRAST TECHNIQUE: Multidetector CT imaging of the chest was performed  using the standard protocol during bolus administration of intravenous contrast. Multiplanar CT image reconstructions and MIPs were obtained to evaluate the vascular anatomy. CONTRAST:  85mL OMNIPAQUE IOHEXOL 350 MG/ML SOLN COMPARISON:  06/14/2020 FINDINGS: Cardiovascular: There is adequate opacification of the pulmonary arterial tree. There is no intraluminal filling defect identified to suggest acute pulmonary embolism. The central pulmonary arteries are enlarged, similar to that noted on prior examination, in keeping with changes of pulmonary arterial hypertension. Extensive multi-vessel coronary artery calcification again noted. Global cardiac size within normal limits. Large pericardial effusion is again seen, unchanged from prior examination. Moderate atherosclerotic calcification of the thoracic aorta again identified. No aortic aneurysm. Mediastinum/Nodes: No enlarged mediastinal, hilar, or axillary lymph nodes. Thyroid gland, trachea, and esophagus demonstrate no significant findings. Lungs/Pleura: Small bilateral pleural effusions are present, new since prior examination. Mild bibasilar associated compressive atelectasis. No superimposed confluent pulmonary infiltrate. No pneumothorax. Central airways are widely patent. Upper Abdomen: No acute abnormality. Musculoskeletal: Anterior wedge compression fractures of T4 and T5 are again seen, chronic in nature, with resultant focal thoracic kyphosis. Review of the MIP images confirms the above findings. IMPRESSION: No pulmonary embolism. Extensive multi-vessel coronary artery calcification. Morphologic changes of pulmonary arterial hypertension. Stable large pericardial effusion. Interval development of small bilateral pleural effusions. Aortic Atherosclerosis (ICD10-I70.0). Electronically Signed   By: Fidela Salisbury MD   On: 06/20/2020 13:01   DG Chest Portable 1 View  Result Date: 06/20/2020 CLINICAL DATA:  Shortness of breath. EXAM: PORTABLE CHEST 1 VIEW  COMPARISON:  April 19, 2020. FINDINGS: Stable cardiomegaly. No pneumothorax or pleural effusion is noted. Lungs are clear. Bony thorax is unremarkable. IMPRESSION: No active disease. Electronically Signed   By: Marijo Conception M.D.   On: 06/20/2020 10:56   ECHOCARDIOGRAM COMPLETE  Result Date: 06/20/2020    ECHOCARDIOGRAM REPORT   Patient Name:   KATHALINA ARVAYO Date of Exam: 06/20/2020 Medical Rec #:  [id number]     Height:       63.0 in Accession #:    [id number]    Weight:       158.8 lb Date of Birth:  02-16-1923      BSA:          1.753 m Patient Age:    89 years      BP:           162/79 mmHg Patient Gender: F             HR:  88 bpm. Exam Location:  Inpatient Procedure: 2D Echo, Cardiac Doppler and Color Doppler STAT ECHO Indications:    Pericardial effusion  History:        Patient has no prior history of Echocardiogram examinations.                 Signs/Symptoms:Shortness of Breath and Pleural effusion.  Sonographer:    Dustin Flock Referring Phys: [id number] ADAM CURATOLO IMPRESSIONS  1. Left ventricular ejection fraction, by estimation, is 60 to 65%. The left ventricle has normal function. The left ventricle has no regional wall motion abnormalities. Left ventricular diastolic parameters are indeterminate. Elevated left ventricular end-diastolic pressure.  2. Right ventricular systolic function is normal. The right ventricular size is normal. There is moderately elevated pulmonary artery systolic pressure.  3. Left atrial size was mildly dilated.  4. The mitral valve is normal in structure. Trivial mitral valve regurgitation. No evidence of mitral stenosis.  5. The aortic valve is tricuspid. There is moderate calcification of the aortic valve. There is moderate thickening of the aortic valve. Aortic valve regurgitation is not visualized. Mild to moderate aortic valve sclerosis/calcification is present, without any evidence of aortic stenosis.  6. The inferior vena cava is normal in size with  greater than 50% respiratory variability, suggesting right atrial pressure of 3 mmHg.  7. Moderate to large pericardial effusion. The pericardial effusion is circumferential. The effusion is largest posterior to the LV and apically measuring 2.63cm at greatest diameter. THe IVC is dilated and plethoric but there is no significant change in MV inflow with respirations. The epigastric view is forshortened and therefore cannot accurately assess for RV diastolic collapse.  8. No prior echo to compare regarding pericardial effusion. FINDINGS  Left Ventricle: Left ventricular ejection fraction, by estimation, is 60 to 65%. The left ventricle has normal function. The left ventricle has no regional wall motion abnormalities. The left ventricular internal cavity size was normal in size. There is  no left ventricular hypertrophy. Left ventricular diastolic parameters are indeterminate. Elevated left ventricular end-diastolic pressure. Right Ventricle: The right ventricular size is normal. No increase in right ventricular wall thickness. Right ventricular systolic function is normal. There is moderately elevated pulmonary artery systolic pressure. The tricuspid regurgitant velocity is 3.47 m/s, and with an assumed right atrial pressure of 3 mmHg, the estimated right ventricular systolic pressure is 0000000 mmHg. Left Atrium: Left atrial size was mildly dilated. Right Atrium: Right atrial size was normal in size. Pericardium: The effusion is largest posterior to the LV and apically measuring 2.63cm at greatest diameter. THe IVC is dilated and plethoric but there is no significant change in MV inflow with respirations. The epigastric view is forshortened and therefore cannot accurately assess for RV diastolic collapse. A large pericardial effusion is present. The pericardial effusion is circumferential. Mitral Valve: The mitral valve is normal in structure. Mild to moderate mitral annular calcification. Trivial mitral valve  regurgitation. No evidence of mitral valve stenosis. Tricuspid Valve: The tricuspid valve is normal in structure. Tricuspid valve regurgitation is mild . No evidence of tricuspid stenosis. Aortic Valve: The aortic valve is tricuspid. There is moderate calcification of the aortic valve. There is moderate thickening of the aortic valve. Aortic valve regurgitation is not visualized. Mild to moderate aortic valve sclerosis/calcification is present, without any evidence of aortic stenosis. Aortic valve peak gradient measures 12.7 mmHg. Pulmonic Valve: The pulmonic valve was normal in structure. Pulmonic valve regurgitation is trivial. No evidence of pulmonic stenosis. Aorta: The aortic  root is normal in size and structure. Venous: The inferior vena cava is normal in size with greater than 50% respiratory variability, suggesting right atrial pressure of 3 mmHg. IAS/Shunts: No atrial level shunt detected by color flow Doppler. Additional Comments: There is a small pleural effusion in the left lateral region.  LEFT VENTRICLE PLAX 2D LVIDd:         3.50 cm  Diastology LVIDs:         2.40 cm  LV e' medial:    5.44 cm/s LV PW:         1.10 cm  LV E/e' medial:  26.3 LV IVS:        1.10 cm  LV e' lateral:   9.14 cm/s LVOT diam:     2.10 cm  LV E/e' lateral: 15.6 LV SV:         63 LV SV Index:   36 LVOT Area:     3.46 cm  RIGHT VENTRICLE RV Basal diam:  3.20 cm RV S prime:     12.20 cm/s TAPSE (M-mode): 3.0 cm LEFT ATRIUM             Index       RIGHT ATRIUM           Index LA diam:        3.60 cm 2.05 cm/m  RA Area:     17.50 cm LA Vol (A2C):   56.0 ml 31.94 ml/m RA Volume:   51.50 ml  29.38 ml/m LA Vol (A4C):   68.7 ml 39.19 ml/m LA Biplane Vol: 61.9 ml 35.31 ml/m  AORTIC VALVE AV Area (Vmax): 2.34 cm AV Vmax:        178.00 cm/s AV Peak Grad:   12.7 mmHg LVOT Vmax:      120.00 cm/s LVOT Vmean:     68.800 cm/s LVOT VTI:       0.181 m  AORTA Ao Root diam: 2.80 cm MITRAL VALVE                TRICUSPID VALVE MV Area (PHT):  10.25 cm    TR Peak grad:   48.2 mmHg MV Decel Time: 74 msec      TR Vmax:        347.00 cm/s MV E velocity: 143.00 cm/s MV A velocity: 45.30 cm/s   SHUNTS MV E/A ratio:  3.16         Systemic VTI:  0.18 m                             Systemic Diam: 2.10 cm Fransico Him MD Electronically signed by Fransico Him MD Signature Date/Time: 06/20/2020/12:17:09 PM    Final     Scheduled Meds: . colchicine  0.6 mg Oral Daily  . enoxaparin (LOVENOX) injection  40 mg Subcutaneous Q24H  . fluticasone furoate-vilanterol  1 puff Inhalation Daily  . furosemide  40 mg Oral Daily  . hydrALAZINE  10 mg Oral Q8H  . ibuprofen  800 mg Oral TID  . insulin aspart  0-9 Units Subcutaneous TID WC  . linagliptin  5 mg Oral Daily  . mouth rinse  15 mL Mouth Rinse BID  . multivitamin  1 tablet Oral Daily  . NIFEdipine  90 mg Oral Daily  . pantoprazole  40 mg Oral Daily  . potassium chloride  20 mEq Oral Daily  . pravastatin  20 mg Oral Daily  .  sodium chloride flush  3 mL Intravenous Q12H  . sucralfate  1 g Oral TID WC & HS   Continuous Infusions: . sodium chloride       LOS: 2 days   Time spent: 25 minutes.  Patrecia Pour, MD [facility] [email or web address] 06/22/2020, 9:58 AM

## 2020-06-22 NOTE — Progress Notes (Signed)
Progress Note  Patient Name: Robin Mcclure Date of Encounter: 06/22/2020  [facility] Cardiologist: New  Subjective   Dyspnea improved; still with cough; no CP  Inpatient Medications    Scheduled Meds: . colchicine  0.6 mg Oral Daily  . enoxaparin (LOVENOX) injection  40 mg Subcutaneous Q24H  . fluticasone furoate-vilanterol  1 puff Inhalation Daily  . furosemide  40 mg Intravenous Daily  . hydrALAZINE  10 mg Oral Q8H  . ibuprofen  800 mg Oral TID  . insulin aspart  0-9 Units Subcutaneous TID WC  . linagliptin  5 mg Oral Daily  . mouth rinse  15 mL Mouth Rinse BID  . multivitamin  1 tablet Oral Daily  . NIFEdipine  90 mg Oral Daily  . pantoprazole  40 mg Oral Daily  . pravastatin  20 mg Oral Daily  . sodium chloride flush  3 mL Intravenous Q12H  . sucralfate  1 g Oral TID WC & HS   Continuous Infusions: . sodium chloride     PRN Meds: sodium chloride, acetaminophen, albuterol, labetalol, ondansetron (ZOFRAN) IV, sodium chloride flush   Vital Signs    Vitals:   06/22/20 0035 06/22/20 0432 06/22/20 0500 06/22/20 0648  BP: 140/67 137/78  139/80  Pulse: 64 76    Resp: (!) 32 20    Temp: 98.2 F (36.8 C) 97.7 F (36.5 C)    TempSrc: Oral Oral    SpO2: 93% 94%    Weight:   68.1 kg   Height:        Intake/Output Summary (Last 24 hours) at 06/22/2020 0717 Last data filed at 06/22/2020 0432 Gross per 24 hour  Intake -  Output 400 ml  Net -400 ml   Last 3 Weights 06/22/2020 06/21/2020 01/19/2018  Weight (lbs) 150 lb 2.1 oz 153 lb 7 oz 158 lb 12.8 oz  Weight (kg) 68.1 kg 69.6 kg 72.031 kg      Telemetry    Sinus with pacs and pvcs- Personally Reviewed  Physical Exam   GEN: No acute distress. WD WN  Neck: No JVD, supple Cardiac: RRR, positive rub; no gallop Respiratory: Clear to auscultation bilaterally; no wheeze GI: Soft, NT/ND MS: No edema Neuro:  Grossly intact Psych: Normal affect   Labs    High Sensitivity Troponin:   Recent Labs  Lab  06/20/20 1030 06/20/20 1455  TROPONINIHS 44* 49*      Chemistry Recent Labs  Lab 06/20/20 1030 06/21/20 0315 06/22/20 0304  NA 139 142 141  K 2.7* 3.6 3.7  CL 103 106 106  CO2 23 26 23   GLUCOSE 174* 182* 228*  BUN 12 14 23   CREATININE 0.69 0.79 1.13*  CALCIUM 10.1 9.9 10.0  PROT 6.5  --   --   ALBUMIN 2.5*  --   --   AST 18  --   --   ALT 18  --   --   ALKPHOS 83  --   --   BILITOT 0.9  --   --   GFRNONAA >60 >60 45*  ANIONGAP 13 10 12      Hematology Recent Labs  Lab 06/20/20 1030  WBC 9.3  RBC 3.80*  HGB 10.3*  HCT 34.0*  MCV 89.5  MCH 27.1  MCHC 30.3  RDW 15.2  PLT 400    BNP Recent Labs  Lab 06/20/20 1030  BNP 277.0*    Radiology    CT Angio Chest PE W and/or Wo Contrast  Result Date:  06/20/2020 CLINICAL DATA:  Dyspnea, lethargy EXAM: CT ANGIOGRAPHY CHEST WITH CONTRAST TECHNIQUE: Multidetector CT imaging of the chest was performed using the standard protocol during bolus administration of intravenous contrast. Multiplanar CT image reconstructions and MIPs were obtained to evaluate the vascular anatomy. CONTRAST:  53mL OMNIPAQUE IOHEXOL 350 MG/ML SOLN COMPARISON:  06/14/2020 FINDINGS: Cardiovascular: There is adequate opacification of the pulmonary arterial tree. There is no intraluminal filling defect identified to suggest acute pulmonary embolism. The central pulmonary arteries are enlarged, similar to that noted on prior examination, in keeping with changes of pulmonary arterial hypertension. Extensive multi-vessel coronary artery calcification again noted. Global cardiac size within normal limits. Large pericardial effusion is again seen, unchanged from prior examination. Moderate atherosclerotic calcification of the thoracic aorta again identified. No aortic aneurysm. Mediastinum/Nodes: No enlarged mediastinal, hilar, or axillary lymph nodes. Thyroid gland, trachea, and esophagus demonstrate no significant findings. Lungs/Pleura: Small bilateral pleural  effusions are present, new since prior examination. Mild bibasilar associated compressive atelectasis. No superimposed confluent pulmonary infiltrate. No pneumothorax. Central airways are widely patent. Upper Abdomen: No acute abnormality. Musculoskeletal: Anterior wedge compression fractures of T4 and T5 are again seen, chronic in nature, with resultant focal thoracic kyphosis. Review of the MIP images confirms the above findings. IMPRESSION: No pulmonary embolism. Extensive multi-vessel coronary artery calcification. Morphologic changes of pulmonary arterial hypertension. Stable large pericardial effusion. Interval development of small bilateral pleural effusions. Aortic Atherosclerosis (ICD10-I70.0). Electronically Signed   By: Fidela Salisbury MD   On: 06/20/2020 13:01   DG Chest Portable 1 View  Result Date: 06/20/2020 CLINICAL DATA:  Shortness of breath. EXAM: PORTABLE CHEST 1 VIEW COMPARISON:  April 19, 2020. FINDINGS: Stable cardiomegaly. No pneumothorax or pleural effusion is noted. Lungs are clear. Bony thorax is unremarkable. IMPRESSION: No active disease. Electronically Signed   By: Marijo Conception M.D.   On: 06/20/2020 10:56   ECHOCARDIOGRAM COMPLETE  Result Date: 06/20/2020    ECHOCARDIOGRAM REPORT   Patient Name:   Robin Mcclure Date of Exam: 06/20/2020 Medical Rec #:  [id number]     Height:       63.0 in Accession #:    [id number]    Weight:       158.8 lb Date of Birth:  01/03/1923      BSA:          1.753 m Patient Age:    87 years      BP:           162/79 mmHg Patient Gender: F             HR:           88 bpm. Exam Location:  Inpatient Procedure: 2D Echo, Cardiac Doppler and Color Doppler STAT ECHO Indications:    Pericardial effusion  History:        Patient has no prior history of Echocardiogram examinations.                 Signs/Symptoms:Shortness of Breath and Pleural effusion.  Sonographer:    Dustin Flock Referring Phys: [id number] ADAM CURATOLO IMPRESSIONS  1. Left ventricular  ejection fraction, by estimation, is 60 to 65%. The left ventricle has normal function. The left ventricle has no regional wall motion abnormalities. Left ventricular diastolic parameters are indeterminate. Elevated left ventricular end-diastolic pressure.  2. Right ventricular systolic function is normal. The right ventricular size is normal. There is moderately elevated pulmonary artery systolic pressure.  3. Left atrial size was mildly dilated.  4.  The mitral valve is normal in structure. Trivial mitral valve regurgitation. No evidence of mitral stenosis.  5. The aortic valve is tricuspid. There is moderate calcification of the aortic valve. There is moderate thickening of the aortic valve. Aortic valve regurgitation is not visualized. Mild to moderate aortic valve sclerosis/calcification is present, without any evidence of aortic stenosis.  6. The inferior vena cava is normal in size with greater than 50% respiratory variability, suggesting right atrial pressure of 3 mmHg.  7. Moderate to large pericardial effusion. The pericardial effusion is circumferential. The effusion is largest posterior to the LV and apically measuring 2.63cm at greatest diameter. THe IVC is dilated and plethoric but there is no significant change in MV inflow with respirations. The epigastric view is forshortened and therefore cannot accurately assess for RV diastolic collapse.  8. No prior echo to compare regarding pericardial effusion. FINDINGS  Left Ventricle: Left ventricular ejection fraction, by estimation, is 60 to 65%. The left ventricle has normal function. The left ventricle has no regional wall motion abnormalities. The left ventricular internal cavity size was normal in size. There is  no left ventricular hypertrophy. Left ventricular diastolic parameters are indeterminate. Elevated left ventricular end-diastolic pressure. Right Ventricle: The right ventricular size is normal. No increase in right ventricular wall thickness.  Right ventricular systolic function is normal. There is moderately elevated pulmonary artery systolic pressure. The tricuspid regurgitant velocity is 3.47 m/s, and with an assumed right atrial pressure of 3 mmHg, the estimated right ventricular systolic pressure is 14.9 mmHg. Left Atrium: Left atrial size was mildly dilated. Right Atrium: Right atrial size was normal in size. Pericardium: The effusion is largest posterior to the LV and apically measuring 2.63cm at greatest diameter. THe IVC is dilated and plethoric but there is no significant change in MV inflow with respirations. The epigastric view is forshortened and therefore cannot accurately assess for RV diastolic collapse. A large pericardial effusion is present. The pericardial effusion is circumferential. Mitral Valve: The mitral valve is normal in structure. Mild to moderate mitral annular calcification. Trivial mitral valve regurgitation. No evidence of mitral valve stenosis. Tricuspid Valve: The tricuspid valve is normal in structure. Tricuspid valve regurgitation is mild . No evidence of tricuspid stenosis. Aortic Valve: The aortic valve is tricuspid. There is moderate calcification of the aortic valve. There is moderate thickening of the aortic valve. Aortic valve regurgitation is not visualized. Mild to moderate aortic valve sclerosis/calcification is present, without any evidence of aortic stenosis. Aortic valve peak gradient measures 12.7 mmHg. Pulmonic Valve: The pulmonic valve was normal in structure. Pulmonic valve regurgitation is trivial. No evidence of pulmonic stenosis. Aorta: The aortic root is normal in size and structure. Venous: The inferior vena cava is normal in size with greater than 50% respiratory variability, suggesting right atrial pressure of 3 mmHg. IAS/Shunts: No atrial level shunt detected by color flow Doppler. Additional Comments: There is a small pleural effusion in the left lateral region.  LEFT VENTRICLE PLAX 2D LVIDd:          3.50 cm  Diastology LVIDs:         2.40 cm  LV e' medial:    5.44 cm/s LV PW:         1.10 cm  LV E/e' medial:  26.3 LV IVS:        1.10 cm  LV e' lateral:   9.14 cm/s LVOT diam:     2.10 cm  LV E/e' lateral: 15.6 LV SV:  63 LV SV Index:   36 LVOT Area:     3.46 cm  RIGHT VENTRICLE RV Basal diam:  3.20 cm RV S prime:     12.20 cm/s TAPSE (M-mode): 3.0 cm LEFT ATRIUM             Index       RIGHT ATRIUM           Index LA diam:        3.60 cm 2.05 cm/m  RA Area:     17.50 cm LA Vol (A2C):   56.0 ml 31.94 ml/m RA Volume:   51.50 ml  29.38 ml/m LA Vol (A4C):   68.7 ml 39.19 ml/m LA Biplane Vol: 61.9 ml 35.31 ml/m  AORTIC VALVE AV Area (Vmax): 2.34 cm AV Vmax:        178.00 cm/s AV Peak Grad:   12.7 mmHg LVOT Vmax:      120.00 cm/s LVOT Vmean:     68.800 cm/s LVOT VTI:       0.181 m  AORTA Ao Root diam: 2.80 cm MITRAL VALVE                TRICUSPID VALVE MV Area (PHT): 10.25 cm    TR Peak grad:   48.2 mmHg MV Decel Time: 74 msec      TR Vmax:        347.00 cm/s MV E velocity: 143.00 cm/s MV A velocity: 45.30 cm/s   SHUNTS MV E/A ratio:  3.16         Systemic VTI:  0.18 m                             Systemic Diam: 2.10 cm Fransico Him MD Electronically signed by Fransico Him MD Signature Date/Time: 06/20/2020/12:17:09 PM    Final      Patient Profile   87 yo female with DM, hypertension, hyperlipidemia, remote h/o colon cancer for evaluation of pericardial effusion.  Apparently had viral syndrome in November with associated chills.  She subsequently has developed nonproductive cough which has persisted for several months.  She also notes increased dyspnea and worsening fatigue. Echocardiogram personally reviewed and shows normal LV function.  There was a large pericardial effusion predominantly in the posterior lateral distribution.  Assessment & Plan    1 pericarditis/pericardial effusion-patient had viral syndrome in November likely causing present pericarditis/pericardial effusion.   Clinically she is not in tamponade.  Previous echocardiogram showed large pericardial effusion predominantly in the posterior lateral distribution.  We will plan to repeat tomorrow.  If unchanged will likely plan to discharge on Motrin and colchicine which has improved her symptoms.    2 hypertension-blood pressure controlled.  We will continue Procardia and hydralazine.  3 acute diastolic congestive heart failure-improved.  Change Lasix to oral.  4 hypokalemia-improved; add kdur 20 meq daily.  For questions or updates, please contact [place] Please consult [email or web address] for contact info under        Signed, Kirk Ruths, MD  06/22/2020, 7:17 AM

## 2020-06-22 NOTE — Plan of Care (Signed)

## 2020-06-23 ENCOUNTER — Inpatient Hospital Stay (HOSPITAL_COMMUNITY): Payer: MEDICARE

## 2020-06-23 DIAGNOSIS — I313 Pericardial effusion (noninflammatory): Secondary | ICD-10-CM

## 2020-06-23 LAB — GLUCOSE, CAPILLARY
Glucose-Capillary: 160 mg/dL — ABNORMAL HIGH (ref 70–99)
Glucose-Capillary: 180 mg/dL — ABNORMAL HIGH (ref 70–99)
Glucose-Capillary: 168 mg/dL — ABNORMAL HIGH (ref 70–99)

## 2020-06-23 LAB — COXSACKIE B VIRUS ANTIBODIES
Coxsackie B1 Ab: NEGATIVE
Coxsackie B2 Ab: NEGATIVE
Coxsackie B3 Ab: NEGATIVE
Coxsackie B4 Ab: NEGATIVE
Coxsackie B5 Ab: NEGATIVE
Coxsackie B6 Ab: NEGATIVE

## 2020-06-23 LAB — ECHOCARDIOGRAM LIMITED
Height: 65 in
Weight: 2409.6 oz

## 2020-06-23 LAB — BASIC METABOLIC PANEL
Anion gap: 11 (ref 5–15)
BUN: 30 mg/dL — ABNORMAL HIGH (ref 8–23)
CO2: 23 mmol/L (ref 22–32)
Calcium: 10.8 mg/dL — ABNORMAL HIGH (ref 8.9–10.3)
Chloride: 106 mmol/L (ref 98–111)
Creatinine, Ser: 1.23 mg/dL — ABNORMAL HIGH (ref 0.44–1.00)
GFR, Estimated: 40 mL/min — ABNORMAL LOW (ref >60)
Glucose, Bld: 197 mg/dL — ABNORMAL HIGH (ref 70–99)
Potassium: 3.9 mmol/L (ref 3.5–5.1)
Sodium: 140 mmol/L (ref 135–145)

## 2020-06-23 MED ORDER — COLCHICINE 0.6 MG PO TABS: 0.6000 mg | ORAL_TABLET | Freq: Every day | ORAL | 0 refills | Status: DC

## 2020-06-23 MED ORDER — IBUPROFEN 600 MG PO TABS: 600.0000 mg | ORAL_TABLET | Freq: Three times a day (TID) | ORAL | 0 refills | Status: DC

## 2020-06-23 MED ORDER — SITAGLIPTIN PHOSPHATE 50 MG PO TABS: 50.0000 mg | ORAL_TABLET | Freq: Every day | ORAL | 0 refills | Status: DC

## 2020-06-23 NOTE — Discharge Instructions (Signed)
Acute Respiratory Failure, Adult Acute respiratory failure is a condition that is a medical emergency. It can develop quickly, and it should be treated right away. There are two types of acute respiratory failure:  Type I respiratory failure is when the lungs are not able to get enough oxygen into the blood. This causes the blood oxygen level to drop.  Type II respiratory failure is when carbon dioxide is not passing from the lungs out of the body. This causes carbon dioxide to build up in the blood. A person may have one type of acute respiratory failure or have both types at the same time. What are the causes? Common causes of type I respiratory failure include:  Trauma to the lung, chest, ribs, or tissues around the lung.  Pneumonia.  Lung diseases, such as pulmonary fibrosis or asthma.  Smoke, chemical, or water inhalation.  A blood clot in the lungs (pulmonary embolism).  A blood infection (sepsis).  Heart attack. Common causes of type II respiratory failure include:  Stroke.  A spinal cord injury.  A drug or alcohol overdose.  A blood infection (sepsis).  Cardiac arrest. What increases the risk? This condition is more likely to develop in people who have:  Lung diseases such as asthma or chronic obstructive pulmonary disease (COPD).  A condition that damages or weakens the muscles, nerves, bones, or tissues that are involved in breathing, such as myasthenia gravis or Guillain-Barr syndrome.  A serious infection.  A health problem that blocks the unconscious reflex that is involved in breathing, such as hypothyroidism or sleep apnea. What are the signs or symptoms? Trouble breathing is the main symptom of acute respiratory failure. Symptoms may also include:  Fast breathing.  Restlessness or anxiety.  Breathing loudly (wheezing) and grunting.  Fast or irregular heartbeats (palpitations).  Confusion or changes in behavior.  Feeling tired (fatigue),  sleeping more than normal, or being hard to wake.  Skin, lips, or fingernails that appear blue (cyanosis). How is this diagnosed? This condition may be diagnosed based on:  Your medical history and a physical exam. Your health care provider will listen to your heart and lungs to check for abnormal sounds.  Tests to confirm the diagnosis and determine the cause of respiratory failure. These tests may include: ? Measuring the amount of oxygen in your blood (pulse oximetry). The measurement comes from a small device that is placed on your finger, earlobe, or toe. ? Blood tests to measure blood oxygen and carbon dioxide and to look for signs of infection. ? Tests on a sample of the fluid that surrounds the spinal cord (cerebrospinal fluid) or a sample of fluid that is drawn from the windpipe (trachea) to check for infections. ? Chest X-ray. ? Electrocardiogram (ECG) to look at the heart's electrical activity.   How is this treated? Treatment for this condition usually takes place in a hospital intensive care unit (ICU). Treatment depends on what is causing the condition. It may include one or more of these treatments:  Oxygen may be given through your nose or a face mask.  A device such as a continuous positive airway pressure (CPAP) machine or bi-level positive airway pressure (BPAP) machine may be used to help you breathe. The device gives you oxygen and pressure.  Breathing treatments, fluids, and other medicines may be given.  A ventilator may be used to help you breathe. The machine gives you oxygen and pressure. A tube is put into your mouth and trachea to connect the   ventilator. ? If this treatment is needed longer term, a tracheostomy may be placed. A tracheostomy is a breathing tube put through your neck into your trachea.  In extreme cases, extracorporeal life support (ECLS) may be used. This treatment temporarily takes over the function of the heart and lungs, supplying oxygen and  removing carbon dioxide. ECLS gives the lungs a chance to recover. Follow these instructions at home: Medicines  Take over-the-counter and prescription medicines only as told by your health care provider.  If you were prescribed an antibiotic medicine, take it as told by your health care provider. Do not stop using the antibiotic even if you start to feel better.  If you are taking blood thinners: ? Talk with your health care provider before you take any medicines that contain aspirin or NSAIDs, such as ibuprofen. These medicines increase your risk for dangerous bleeding. ? Take your medicine exactly as told, at the same time every day. ? Avoid activities that could cause injury or bruising, and follow instructions about how to prevent falls. ? Wear a medical alert bracelet or carry a card that lists what medicines you take. General instructions  Return to your normal activities as told by your health care provider. Ask your health care provider what activities are safe for you.  Do not use any products that contain nicotine or tobacco, such as cigarettes, e-cigarettes, and chewing tobacco. If you need help quitting, ask your health care provider.  Do not drink alcohol if: ? Your health care provider tells you not to drink. ? You are pregnant, may be pregnant, or are planning to become pregnant.  Wear compression stockings as told by your health care provider. These stockings help to prevent blood clots and reduce swelling in your legs.  Attend any physical therapy and pulmonary rehabilitation as told by your health care provider.  Keep all follow-up visits as told by your health care provider. This is important. How is this prevented?  If you have an infection or a medical condition that may lead to acute respiratory failure, make sure you get proper treatment. Contact a health care provider if:  You have a fever.  Your symptoms do not improve or they get worse. Get help right  away if:  You are having trouble breathing.  You lose consciousness.  You develop a fast heart rate.  Your fingers, lips, or other areas turn blue.  You are confused. These symptoms may represent a serious problem that is an emergency. Do not wait to see if the symptoms will go away. Get medical help right away. Call your local emergency services (911 in the [place] Do not drive yourself to the hospital. Summary  Acute respiratory failure is a medical emergency. It can develop quickly, and it should be treated right away.  Treatment for this condition usually takes place in a hospital intensive care unit (ICU). Treatment may include oxygen, fluids, and medicines. A device may be used to help you breathe, such as a ventilator.  Take over-the-counter and prescription medicines only as told by your health care provider.  Contact a health care provider if your symptoms do not improve or if they get worse. This information is not intended to replace advice given to you by your health care provider. Make sure you discuss any questions you have with your health care provider. Document Revised: 04/24/2019 Document Reviewed: 04/24/2019 Elsevier Patient Education  [place].   Conn's Current Therapy 2017 (pp. 79-158). [place], [place]: Elsevier, Inc.">  Stoelting's Anesthesia and Co-Existing Disease (7th ed., pp. 225-236). [place], [place]: Elsevier, Inc."> Braunwald's Heart Disease (11th ed., pp. [phone]). [place]: Elsevier.">  Pericardial Effusion  Pericardial effusion is a buildup of fluid around the heart. The heart is surrounded by a thin, double-layered sac (pericardium). When fluid builds up in this sac, it can put too much pressure on the heart and make it harder for the heart to pump blood (cardiac tamponade). This can be life-threatening. What are the causes? Often, the cause of pericardial effusion is not known (idiopathic effusion). In some cases, the condition may be  caused by:  Infections from a virus, fungus, parasite, or bacteria.  Damage to the pericardium from heart surgery or a heart attack.  Inflammatory diseases, such as rheumatoid arthritis or lupus.  Kidney or thyroid disease.  Cancer or treatment for cancer, including radiation or chemotherapy.  Certain medicines, including medicines for tuberculosis or seizures.  Chest injury. What are the signs or symptoms? Pericardial effusion may not cause symptoms at first, especially if the fluid builds up slowly. In time, pressure on the heart may cause:  Chest pain and trouble breathing. This may include pain and shortness of breath that get worse when lying down.  Dizziness and fainting.  Coughing and hiccups.  Skipped heartbeats.  Anxiety and confusion.  A bluish skin color (cyanosis).  Swollen legs and ankles.  A feeling of fullness in the chest. How is this diagnosed? This condition is diagnosed based on your symptoms and testing, which may include:  A test that creates ultrasound images of your heart (echocardiogram).  A test to examine the electrical functions of your heart (electrocardiogram).  Chest X-ray.  CT scan.  MRI.  Blood tests. How is this treated? Treatment for this condition depends on the cause of your condition and how severe your symptoms are. Treatment may include:  Medicines, such as: ? NSAIDs, such as ibuprofen. ? Anti-inflammatory medicines, such as steroids. ? Medicines to fight infection, such as antibiotics.  Hospital treatment. This may be necessary if the fluid around the heart prevents it from pumping enough blood. Treatment in the hospital may include: ? IV fluids. ? Breathing support.  Surgery. This may be needed in severe cases. Surgery may include: ? A procedure to remove fluid from the pericardium by placing a needle into it (pericardiocentesis). ? A procedure to make a permanent opening in the pericardium (pericardial  window). ? Open heart surgery. Follow these instructions at home:  Take over-the-counter and prescription medicines only as told by your health care provider.  If you were prescribed a medicine to fight infection, such as antibiotic medicine, take it as told by your health care provider. Do not stop taking the medicine even if you start to feel better.  Rest as told by your health care provider. Ask your health care provider what activities are safe for you.  Keep all follow-up visits as told by your health care provider. This is important. Contact a health care provider if:  You have a cough or hiccups that do not go away.  You have severe swelling in your legs or ankles. Get help right away if you:  Have fast or irregular heartbeats (palpitations).  Feel dizzy or light-headed.  Faint.  Have chest pain.  Have trouble breathing. These symptoms may represent a serious problem that is an emergency. Do not wait to see if the symptoms will go away. Get medical help right away. Call your local emergency services (911 in the  [place] Do not drive yourself to the hospital. Summary  Pericardial effusion is a buildup of fluid around the heart. The fluid can eventually prevent the heart from pumping enough blood (cardiac tamponade), which can be life-threatening.  Pericardial effusion may not cause symptoms at first.  Treatment for pericardial effusion depends on the cause of your condition and how severe your symptoms are. In severe cases, hospital treatment or surgery may be required.  Rest as told by your health care provider. Ask your health care provider what activities are safe for you. This information is not intended to replace advice given to you by your health care provider. Make sure you discuss any questions you have with your health care provider. Document Revised: 09/23/2018 Document Reviewed: 09/23/2018 Elsevier Patient Education  [place].   Conn's Current  Therapy 2017 (pp. 79-158). [place], [place]: Elsevier, Inc."> Stoelting's Anesthesia and Co-Existing Disease (7th ed., pp. 225-236). [place], [place]: Elsevier, Inc."> Braunwald's Heart Disease (11th ed., pp. [phone]). [place], [place]: Elsevier.">  Pericardial Effusion  Pericardial effusion is a buildup of fluid around the heart. The heart is surrounded by a thin, double-layered sac (pericardium). When fluid builds up in this sac, it can put too much pressure on the heart and make it harder for the heart to pump blood (cardiac tamponade). This can be life-threatening. What are the causes? Often, the cause of pericardial effusion is not known (idiopathic effusion). In some cases, the condition may be caused by:  Infections from a virus, fungus, parasite, or bacteria.  Damage to the pericardium from heart surgery or a heart attack.  Inflammatory diseases, such as rheumatoid arthritis or lupus.  Kidney or thyroid disease.  Cancer or treatment for cancer, including radiation or chemotherapy.  Certain medicines, including medicines for tuberculosis or seizures.  Chest injury. What are the signs or symptoms? Pericardial effusion may not cause symptoms at first, especially if the fluid builds up slowly. In time, pressure on the heart may cause:  Chest pain and trouble breathing. This may include pain and shortness of breath that get worse when lying down.  Dizziness and fainting.  Coughing and hiccups.  Skipped heartbeats.  Anxiety and confusion.  A bluish skin color (cyanosis).  Swollen legs and ankles.  A feeling of fullness in the chest. How is this diagnosed? This condition is diagnosed based on your symptoms and testing, which may include:  A test that creates ultrasound images of your heart (echocardiogram).  A test to examine the electrical functions of your heart (electrocardiogram).  Chest X-ray.  CT scan.  MRI.  Blood tests. How is this treated? Treatment for  this condition depends on the cause of your condition and how severe your symptoms are. Treatment may include:  Medicines, such as: ? NSAIDs, such as ibuprofen. ? Anti-inflammatory medicines, such as steroids. ? Medicines to fight infection, such as antibiotics.  Hospital treatment. This may be necessary if the fluid around the heart prevents it from pumping enough blood. Treatment in the hospital may include: ? IV fluids. ? Breathing support.  Surgery. This may be needed in severe cases. Surgery may include: ? A procedure to remove fluid from the pericardium by placing a needle into it (pericardiocentesis). ? A procedure to make a permanent opening in the pericardium (pericardial window). ? Open heart surgery. Follow these instructions at home:  Take over-the-counter and prescription medicines only as told by your health care provider.  If you were prescribed a medicine to fight infection, such as antibiotic medicine,  take it as told by your health care provider. Do not stop taking the medicine even if you start to feel better.  Rest as told by your health care provider. Ask your health care provider what activities are safe for you.  Keep all follow-up visits as told by your health care provider. This is important. Contact a health care provider if:  You have a cough or hiccups that do not go away.  You have severe swelling in your legs or ankles. Get help right away if you:  Have fast or irregular heartbeats (palpitations).  Feel dizzy or light-headed.  Faint.  Have chest pain.  Have trouble breathing. These symptoms may represent a serious problem that is an emergency. Do not wait to see if the symptoms will go away. Get medical help right away. Call your local emergency services (911 in the [place] Do not drive yourself to the hospital. Summary  Pericardial effusion is a buildup of fluid around the heart. The fluid can eventually prevent the heart from pumping enough  blood (cardiac tamponade), which can be life-threatening.  Pericardial effusion may not cause symptoms at first.  Treatment for pericardial effusion depends on the cause of your condition and how severe your symptoms are. In severe cases, hospital treatment or surgery may be required.  Rest as told by your health care provider. Ask your health care provider what activities are safe for you. This information is not intended to replace advice given to you by your health care provider. Make sure you discuss any questions you have with your health care provider. Document Revised: 09/23/2018 Document Reviewed: 09/23/2018 Elsevier Patient Education  2021 [employer].

## 2020-06-23 NOTE — Progress Notes (Addendum)
Progress Note  Patient Name: Robin Mcclure Date of Encounter: 06/23/2020  [facility] Cardiologist: New  Subjective   Denies CP; no dyspnea; Cough improving  Inpatient Medications    Scheduled Meds: . colchicine  0.6 mg Oral Daily  . enoxaparin (LOVENOX) injection  30 mg Subcutaneous Q24H  . fluticasone furoate-vilanterol  1 puff Inhalation Daily  . furosemide  40 mg Oral Daily  . hydrALAZINE  10 mg Oral Q8H  . ibuprofen  800 mg Oral TID  . insulin aspart  0-9 Units Subcutaneous TID WC  . linagliptin  5 mg Oral Daily  . mouth rinse  15 mL Mouth Rinse BID  . multivitamin  1 tablet Oral Daily  . NIFEdipine  90 mg Oral Daily  . pantoprazole  40 mg Oral Daily  . potassium chloride  20 mEq Oral Daily  . pravastatin  20 mg Oral Daily  . sodium chloride flush  3 mL Intravenous Q12H  . sucralfate  1 g Oral TID WC & HS   Continuous Infusions: . sodium chloride     PRN Meds: sodium chloride, acetaminophen, albuterol, dextromethorphan, labetalol, ondansetron (ZOFRAN) IV, sodium chloride flush   Vital Signs    Vitals:   06/22/20 1157 06/22/20 2018 06/23/20 0218 06/23/20 0518  BP:  128/73  (!) 145/74  Pulse:  80  68  Resp:  (!) 22  20  Temp:  98.1 F (36.7 C)  97.9 F (36.6 C)  TempSrc:  Oral  Oral  SpO2: 91% 90%  93%  Weight:   68.3 kg   Height:        Intake/Output Summary (Last 24 hours) at 06/23/2020 0726 Last data filed at 06/22/2020 2230 Gross per 24 hour  Intake 535 ml  Output 300 ml  Net 235 ml   Last 3 Weights 06/23/2020 06/22/2020 06/21/2020  Weight (lbs) 150 lb 9.6 oz 150 lb 2.1 oz 153 lb 7 oz  Weight (kg) 68.312 kg 68.1 kg 69.6 kg      Telemetry    Sinus with pacs and PVCs; occasional junctional beat; no long pauses- Personally Reviewed  Physical Exam   GEN: NAD Neck: supple Cardiac: RRR, positive rub Respiratory: CTA GI: Soft, NT/ND, no masses MS: No edema Neuro:  No focal findings Psych: Normal affect   Labs    High Sensitivity Troponin:    Recent Labs  Lab 06/20/20 1030 06/20/20 1455  TROPONINIHS 44* 49*      Chemistry Recent Labs  Lab 06/20/20 1030 06/21/20 0315 06/22/20 0304 06/23/20 0214  NA 139 142 141 140  K 2.7* 3.6 3.7 3.9  CL 103 106 106 106  CO2 23 26 23 23   GLUCOSE 174* 182* 228* 197*  BUN 12 14 23  30*  CREATININE 0.69 0.79 1.13* 1.23*  CALCIUM 10.1 9.9 10.0 10.8*  PROT 6.5  --   --   --   ALBUMIN 2.5*  --   --   --   AST 18  --   --   --   ALT 18  --   --   --   ALKPHOS 83  --   --   --   BILITOT 0.9  --   --   --   GFRNONAA >60 >60 45* 40*  ANIONGAP 13 10 12 11      Hematology Recent Labs  Lab 06/20/20 1030  WBC 9.3  RBC 3.80*  HGB 10.3*  HCT 34.0*  MCV 89.5  MCH 27.1  MCHC 30.3  RDW 15.2  PLT 400    BNP Recent Labs  Lab 06/20/20 1030  BNP 277.0*    Patient Profile   87 yo female with DM, hypertension, hyperlipidemia, remote h/o colon cancer for evaluation of pericardial effusion.  Apparently had viral syndrome in November with associated chills.  She subsequently has developed nonproductive cough which has persisted for several months.  She also notes increased dyspnea and worsening fatigue. Echocardiogram personally reviewed and shows normal LV function.  There was a large pericardial effusion predominantly in the posterior lateral distribution.  Assessment & Plan    1 pericarditis/pericardial effusion-patient had viral syndrome in November likely causing present pericarditis/pericardial effusion. No evidence of tamponade clinically. Previous echocardiogram showed large pericardial effusion predominantly in the posterior lateral distribution. We will repeat echocardiogram today. If pericardial fusion unchanged and no tamponade physiology noted she likely can be discharged on Motrin and colchicine. Would plan to repeat echocardiogram in 2 weeks. We will also arrange follow-up in 1 week with APP.  2 hypertension-blood pressure controlled. Continue present dose of Procardia and  hydralazine.  3 acute diastolic congestive heart failure-improved. Renal function slightly worse today. We will hold on further diuresis. Will likely resume Lasix 20 mg daily in 2 to 3 days.  4 hypokalemia-resolved. Discontinue potassium.  5 bradycardia-patient noted to have occasional junctional escape beat on telemetry but no long pauses and she is asymptomatic. Avoid AV nodal blocking agents.  For questions or updates, please contact [place] Please consult [email or web address] for contact info under  Signed, Kirk Ruths, MD  06/23/2020, 7:26 AM   I have personally reviewed the patient's follow-up limited echocardiogram.  There is no evidence of tamponade physiology and pericardial fusion appears smaller.  We will plan to discharge the patient with plan as outlined above. Kirk Ruths

## 2020-06-23 NOTE — Care Management (Signed)
1042 06-23-20 Benefits check submitted for colchicine. Case Manager will follow for cost. Graves-Bigelow, Ocie Cornfield, RN,BSN Case Manager

## 2020-06-23 NOTE — Progress Notes (Signed)
Pt complaining about SOB , given her Ventolin. Lowest SpO2 noticed 85% on Room Air. Placed pt back on 2L [place]. Current SpO2 between 92% on [facility]

## 2020-06-23 NOTE — TOC Transition Note (Signed)
Transition of Care [facility] - CM/SW Discharge Note   Patient Details  Name: Keyandra Swenson MRN: [id number] Date of Birth: April 16, 1923  Transition of Care [facility]) CM/SW Contact:  Bethena Roys, RN Phone Number: 06/23/2020, 3:16 PM   Clinical Narrative:  Family in need of a hospital bed- Hospital bed ordered via [place]- per agency it should be in stock within 1-2 days. Agency will make patient aware of arrival date.  Final next level of care: [place] Barriers to Discharge: No Barriers Identified   Patient Goals and CMS Choice Patient states their goals for this hospitalization and ongoing recovery are:: to return home CMS Medicare.gov Compare Post Acute Care list provided to:: Patient Choice offered to / list presented to : [place]  Discharge Plan and Services In-house Referral: NA Discharge Planning Services: CM Consult Post Acute Care Choice: Home Health          DME Arranged: Hospital bed DME Agency: NA Date DME Agency Contacted: 06/23/20 Time DME Agency Contacted: [phone] Representative spoke with at DME Agency: Freda Munro HH Arranged: RN,Disease Management,PT,Nurse's Aide [place]: Margaret Date [place]: 06/23/20 Time [place]: 1221 Representative spoke with at [place]: Tommi Rumps  Readmission Risk Interventions No flowsheet data found.

## 2020-06-23 NOTE — TOC Benefit Eligibility Note (Signed)
Transition of Care [facility]) Benefit Eligibility Note    Patient Details  Name: Robin Mcclure MRN: [id number] Date of Birth: August 09, 1922   Medication/Dose: COLCHICINE  0.6 MG DAILY  Covered?: Yes  Tier:  (TIER- 4 DRUG)  Prescription Coverage [facility]erred [facility]  Spoke with Person/Company/Phone Number:: [employer] [facility] [employer] [facility] # [phone]  Co-Pay: $27.54  Prior Approval: No  Deductible: Met       Memory Argue Phone Number: 06/23/2020, 1:15 PM

## 2020-06-23 NOTE — Discharge Summary (Signed)
Physician Discharge Summary  Robin Mcclure [id number] DOB: 1922-12-17 DOA: 06/20/2020  PCP: Lujean Amel, MD  Admit date: 06/20/2020 Discharge date: 06/23/2020  Admitted From: Home Disposition: Home   Recommendations for Outpatient Follow-up:  1. Follow up with cardiology within the next week with repeat BMP at follow up and repeat echocardiogram in 2 weeks. 2. Due to renal impairment, janumet was stopped and [place] alone was started.  3. Due to renal impairment losartan and lasix have been held pending repeat BMP and follow up.    Home Health: PT Equipment/Devices: None, no hypoxia with exertion Discharge Condition: Stable CODE STATUS: Full Diet recommendation: Heart healthy, carb-modified  Brief/Interim Summary: Robin Mcclure is a 87 y.o. female with a history of T2DM with peripheral neuropathy, HTN, HLD, colon CA s/p colectomy and chemotherapy in remission who presented to the ED 1/31 with progressive dyspnea. Symptoms began when she felt like she had a cold in late November, tested negative for covid twice with reassuring CXR. Infectious symptoms improved, though cough and dyspnea have continued prompting presentation to PCP who ordered CT chest. This showed large pericardial effusion and cardiomegaly, small left pleural effusion. She was contacted and referred to the ED where echocardiogram confirmed predominantly posterior and lateral pericardial effusion without vital sign evidence of tamponade. Cardiology was consulted, NSAID, colchicine, and IV lasix were given. Cough and dyspnea have improved, hypoxia resolved. Renal function worsened, so ARB was held, lasix ultimately stopped with plateauing of creatinine. Repeat echocardiogram shows decreased size of effusion. The patient has been cleared for discharge by cardiology to continue colchicine and ibuprofen and will need repeat renal function check at follow up.   Discharge Diagnoses:  Active Problems:   Pericarditis  Subacute  pericarditis with large pericardial effusion: Post-viral most likely etiology. Size is large though not associated with tamponade physiology, and not amenable to pericardiocentesis at this time.  - Repeat echocardiogram 06/23/2020 shows decreased size of pericardial effusion per Dr. Stanford Breed. - Continue ibuprofen, discussed with Dr. Stanford Breed, will decrease dose to 600mg  (~10mg /kg) given renail impairment and advanced age - Continue colchicine - Hold lasix pending cardiology follow up  AKI on stage IIIb CKD: This is based on current available renal function testing. SCr up with IV diuresis. - Stopped ARB for now - Hold lasix for now  Demand myocardial ischemia: Mild troponin elevation without STEMI on ECG.  - Deferring work up currently per cardiology.  HTN:  - Continue nifedipine. - Hold ARB pending repeat BMP  GERD:  - Certainly needs to continue protonix while on ibuprofen. This was continued at discharge.  Hypokalemia: Resolved. Since not continuing lasix for now, will not continue standing supplement.  Hypomagnesemia:  - Supplemented  Bacteriuria, pyuria: Pt with no symptoms or signs of UTI. This will be considered colonization and not treated at this time.  Cough: Possibly related to pericardial process. Also possibly related to GERD.  - Improved, can continue antitussive prn  T2DM: HbA1c 7.3% indicating adequate control in 87yo F.  - Continue home meds, no hypoglycemia reported.  Discharge Instructions Discharge Instructions    Diet - low sodium heart healthy   Complete by: As directed    Discharge instructions   Complete by: As directed    The echocardiogram demonstrated a decrease in the size of the pericardial effusion (fluid around the heart). For pericarditis, continue taking ibuprofen 600mg  three times per day and colchicine once per day.   Because you are taking ibuprofen, stop taking aspirin for now.   Your  kidney function is a bit worse, so you should  stop taking lasix, losartan, and janumet FOR NOW. You may restart these at some point, and will need a recheck of the kidney function at follow up. If you are not contacted to schedule a follow up appointment, please call the number provided.   STOP JANUMET. START JANUVIA. To replace janumet, you should take just the januvia portion of that medication. This was sent to your pharmacy as well.   If your symptoms worsen, specifically shortness of breath, chest pain, leg swelling, decreased urine output, or change in mental status seek medical attention right away.   Increase activity slowly   Complete by: As directed      Allergies as of 06/23/2020   No Known Allergies     Medication List    STOP taking these medications   aspirin 81 MG EC tablet   furosemide 40 MG tablet Commonly known as: LASIX   losartan 25 MG tablet Commonly known as: COZAAR   sitaGLIPtin-metformin 50-1000 MG tablet Commonly known as: JANUMET     TAKE these medications   acetaminophen 325 MG tablet Commonly known as: TYLENOL Take 650 mg by mouth every 6 (six) hours as needed for mild pain.   albuterol 108 (90 Base) MCG/ACT inhaler Commonly known as: VENTOLIN HFA Inhale 1-2 puffs into the lungs every 6 (six) hours as needed for wheezing or shortness of breath.   B COMPLEX PO Take 1 tablet by mouth daily.   CALCIUM/MAGNESIUM/ZINC PO Take 1 tablet by mouth 2 (two) times daily.   Cholecalciferol 25 MCG (1000 UT) tablet Take 1,000 Units by mouth daily.   colchicine 0.6 MG tablet Take 1 tablet (0.6 mg total) by mouth daily. Start taking on: June 24, 2020   [employer] SPECTRAVITE SENIOR PO Take 1 tablet by mouth daily.   Fluticasone-Salmeterol 500-50 MCG/DOSE Aepb Commonly known as: ADVAIR Inhale 1 puff into the lungs 2 (two) times daily.   glipiZIDE 2.5 MG 24 hr tablet Commonly known as: GLUCOTROL XL Take 2.5 mg by mouth daily with breakfast.   ibuprofen 600 MG tablet Commonly known as: ADVIL Take  1 tablet (600 mg total) by mouth 3 (three) times daily.   NIFEdipine 90 MG 24 hr tablet Commonly known as: ADALAT CC Take 90 mg by mouth daily.   ondansetron 4 MG tablet Commonly known as: ZOFRAN Take 4 mg by mouth every 8 (eight) hours as needed for nausea.   pantoprazole 40 MG tablet Commonly known as: PROTONIX Take 40 mg by mouth daily.   pravastatin 20 MG tablet Commonly known as: PRAVACHOL Take 20 mg by mouth daily.   sitaGLIPtin 50 MG tablet Commonly known as: Januvia Take 1 tablet (50 mg total) by mouth daily.   sucralfate 1 GM/10ML suspension Commonly known as: Carafate Take 10 mLs (1 g total) by mouth 4 (four) times daily -  with meals and at bedtime. What changed: when to take this       Follow-up Information    [facility] Follow up.   Specialty: Home Health Services Why: Home Health registered nurse for disease management and v/s. Physical Therapy for evaluation and treatment and an Aide for a bath-office to call with visit times.  Contact information: [place] [address] [phone]        [name], [name], MD Follow up.   Specialty: Family Medicine Contact information: [address] [place] [address] [phone]  Crenshaw, Brian S, MD. Call in 1 week(s).   Specialty: Cardiology Why: call if not contacted to schedule an appointment Contact information: [address]  [place] [address] [phone]              No Known Allergies  Consultations:  Cardiology  Procedures/Studies: CT CHEST WO CONTRAST  Result Date: 06/14/2020 CLINICAL DATA:  Cough and fatigue for 2 months. Personal history of colon carcinoma. Former smoker. EXAM: CT CHEST WITHOUT CONTRAST TECHNIQUE: Multidetector CT imaging of the chest was performed following the standard protocol without IV contrast. COMPARISON:  None. FINDINGS: Cardiovascular: A large pericardial effusion is seen.  Mild-to-moderate cardiomegaly is noted. Aortic and coronary atherosclerotic calcification also demonstrated. Mediastinum/Nodes: No masses or pathologically enlarged lymph nodes identified on this unenhanced exam. Lungs/Pleura: A small left pleural effusion is seen, with atelectasis in the dependent left lower lobe. Tiny right pleural effusion versus pleural thickening is demonstrated, with mild right lung parenchymal scarring. No evidence of pulmonary consolidation or mass. No evidence of central airway obstruction. Upper Abdomen:  Unremarkable. Musculoskeletal: No suspicious bone lesions. Wedge compression fracture deformities of the T1 and T2 vertebral bodies are seen which appear old. IMPRESSION: Large pericardial effusion. Small left pleural effusion and mild left lower lobe atelectasis. Right lung scarring and tiny right pleural effusion versus pleural thickening. Aortic Atherosclerosis (ICD10-I70.0). Aortic atherosclerotic calcification noted. Electronically Signed   By: John A Stahl M.D.   On: 06/14/2020 15:22   CT Angio Chest PE W and/or Wo Contrast  Result Date: 06/20/2020 CLINICAL DATA:  Dyspnea, lethargy EXAM: CT ANGIOGRAPHY CHEST WITH CONTRAST TECHNIQUE: Multidetector CT imaging of the chest was performed using the standard protocol during bolus administration of intravenous contrast. Multiplanar CT image reconstructions and MIPs were obtained to evaluate the vascular anatomy. CONTRAST:  76mLNaMAO:1378.4HerbeNCoconutNorton Sound RegioHarrington ChallenUnited Medical Healthwest-New Orl0352m74NaMAO:1378.4HerbeNBolivar PenMemorialcare Orange Coast MeHarrington ChallenEye Laser And Surgery Center0310m74NaMAO:1378.4HerbeNOckLogan RegioHarrington ChallenPecos County Memorial Hosp0365m74NaMAO:1378.4HerbeNFarmRocky Mountain Eye SurgerHarrington ChallenSt. Mary'S Medical Ce0342m74NaMAO:1378.4HerbeNMilGood Samaritan MeHarrington ChallenHoward County Medical Ce0363m74NaMAO:1378.4HerbeNNRegional RehabilitatiHarrington ChallenBaptist Memorial Hospital Ti0325m74NaMAO:1378.4HerbeNGleGreater Peoria Specialty Hospital LLC - Dba Kindred HosHarrington ChallenBrookstone Surgical Ce0353m74NaMAO:1378.4HerbeNGlBayview BehavioHarrington ChallenBaylor Scott & White All Saints [facility] Fort W0317m74NaMAO:1378.4HerbeNEast NiGeorge RegioHarrington ChallenCenter For Ambulatory Surgery0342m74NaMAO:1378.4HerbeNCovCrossing Rivers Health MeHarrington ChallenUnited Medical Rehabilitation Hosp0376m74NaMAO:1378.4HerbeNCanoKerrville StHarrington ChallenLincoln Surgical Hosp0394m74NaMAO:1378.4HerbeNCypress Pointe SurgiHarrington ChallenMercy Medical Center-New Ham0377m74NaMAO:1378.4HerbeNTullaSurgery Center Of IndHarrington ChallenTampa Minimally Invasive Spine Surgery Ce0358m74NaMAO:1378.4HerbeNPoncNewnan EndoscopHarrington ChallenKootenai Outpatient Sur033m74NaMAO:1378.4HerbeNNortRay County MemorHarrington ChallenEmory University Hospital Sm0361m74NaMAO:1378.4HerbeNMayeGrand Strand Regional MeHarrington ChallenBeltline Surgery Center0364m74NaMAO:1378.4HerbeNFlorMaine MeHarrington ChallenSpectrum Health Kelsey Hosp031m74NaMAO:1378.4HerbeNChesBluegrass CommunHarrington ChallenOregon Surgicenter0335m74NaMAO:1378.4HerbeNFroValley Health Ambulatory SuHarrington ChallenSt. John Broken A032m74NaMAO:1378.4HerbeNMoBoundary CommunHarrington ChallenBurke Medical Ce0362m74NaMAO:1378.4HerbeNBeCenter For Health Ambulatory SurgerHarrington ChallenLoretto Hosp0318m74NaMAO:1378.4HerbeNNorth KingPort St LuHarrington ChallenLafayette Regional Rehabilitation Hosp0397m74NaMAO:1378.4HerbeNStandinCalifornia Rehabilitation InHarrington ChallenMcleod L0325m74NaMAO:1378.4HerbeNAChesterfield SuHarrington ChallenMeadows Psychiatric Ce0325m74NaMAO:1378.4HerbeNTuField Memorial CommunHarrington ChallenWayne County Hosp0316m74NaMAO:1378.4HerbeNSouth Chicago HWomen'S Center Of Carolinas HosHarrington ChallenCone He0328m74NaMAO:1378.4HerbeNHoNorthside MHarrington ChallenSt Elizabeth Youngstown Hosp0332m74NaMAO:1378.4HerbeNWScripps Encinitas SurgerHarrington ChallenPalo Verde Hosp0384m74NaMAO:1378.4HerbeNWesEncompass Health Rehabilitation HospitalHarrington ChallenSutter Valley Medical Foundation Dba Briggsmore Surgery Ce0347m74NaMAO:1378.4HerbeNFort Instituto De GastroenterHarrington ChallenLake West Hosp0388m74NaMAO:1378.4HerbeNSequoia SurgiHarrington ChallenExecutive Surgery Center Of Little Rock0376m74NaMAO:1378.4HerbeNLCentura Health-St Thomas MHarrington ChallenLivingston Asc036m74NaMAO:1378.4HerbeBeverly Hills Regional SurgeHarrington ChallenCumberland Hall [id number] MoraarkEXOL 350 MG/ML SOLN COMPARISON:  06/14/2020 FINDINGS: Cardiovascular: There is adequate opacification of the pulmonary arterial tree. There is no intraluminal filling defect identified to suggest acute pulmonary embolism. The central pulmonary arteries are enlarged, similar to that noted on prior examination, in keeping with changes of pulmonary arterial hypertension. Extensive multi-vessel coronary artery calcification again noted. Global cardiac size within normal limits. Large pericardial  effusion is again seen, unchanged from prior examination. Moderate atherosclerotic calcification of the thoracic aorta again identified. No aortic aneurysm. Mediastinum/Nodes: No enlarged mediastinal, hilar, or axillary lymph nodes. Thyroid gland, trachea, and esophagus demonstrate no significant findings. Lungs/Pleura: Small bilateral pleural effusions are present, new since prior examination. Mild bibasilar associated compressive atelectasis. No superimposed confluent pulmonary infiltrate. No pneumothorax. Central airways are widely patent. Upper Abdomen: No acute abnormality. Musculoskeletal: Anterior wedge compression fractures of T4 and T5 are again seen, chronic in nature, with resultant focal thoracic kyphosis. Review of the MIP images confirms the above findings. IMPRESSION: No pulmonary embolism. Extensive multi-vessel coronary artery calcification. Morphologic changes of pulmonary arterial hypertension. Stable large pericardial effusion. Interval development of small bilateral pleural effusions. Aortic Atherosclerosis (ICD10-I70.0). Electronically Signed   By: Ashesh  Parikh MD   On: 06/20/2020 13:01   DG Chest Portable 1 View  Result Date: 06/20/2020 CLINICAL DATA:  Shortness of breath. EXAM: PORTABLE CHEST 1 VIEW COMPARISON:  April 19, 2020. FINDINGS: Stable cardiomegaly. No pneumothorax or pleural effusion is noted. Lungs are clear. Bony thorax is unremarkable. IMPRESSION: No active disease. Electronically Signed   By: James  Green Jr M.D.   On: 06/20/2020 10:56   ECHOCARDIOGRAM COMPLETE  Result Date: 06/20/2020    ECHOCARDIOGRAM REPORT   Patient  Name:   Robin Mcclure Date of Exam: 06/20/2020 Medical Rec #:  [id number]     Height:       63.0 in Accession #:    [id number]    Weight:       158.8 lb Date of Birth:  1922-10-19      BSA:          1.753 m Patient Age:    87 years      BP:           162/79 mmHg Patient Gender: F             HR:           88 bpm. Exam Location:  Inpatient Procedure: 2D  Echo, Cardiac Doppler and Color Doppler STAT ECHO Indications:    Pericardial effusion  History:        Patient has no prior history of Echocardiogram examinations.                 Signs/Symptoms:Shortness of Breath and Pleural effusion.  Sonographer:    Dustin Flock Referring Phys: [id number] ADAM CURATOLO IMPRESSIONS  1. Left ventricular ejection fraction, by estimation, is 60 to 65%. The left ventricle has normal function. The left ventricle has no regional wall motion abnormalities. Left ventricular diastolic parameters are indeterminate. Elevated left ventricular end-diastolic pressure.  2. Right ventricular systolic function is normal. The right ventricular size is normal. There is moderately elevated pulmonary artery systolic pressure.  3. Left atrial size was mildly dilated.  4. The mitral valve is normal in structure. Trivial mitral valve regurgitation. No evidence of mitral stenosis.  5. The aortic valve is tricuspid. There is moderate calcification of the aortic valve. There is moderate thickening of the aortic valve. Aortic valve regurgitation is not visualized. Mild to moderate aortic valve sclerosis/calcification is present, without any evidence of aortic stenosis.  6. The inferior vena cava is normal in size with greater than 50% respiratory variability, suggesting right atrial pressure of 3 mmHg.  7. Moderate to large pericardial effusion. The pericardial effusion is circumferential. The effusion is largest posterior to the LV and apically measuring 2.63cm at greatest diameter. THe IVC is dilated and plethoric but there is no significant change in MV inflow with respirations. The epigastric view is forshortened and therefore cannot accurately assess for RV diastolic collapse.  8. No prior echo to compare regarding pericardial effusion. FINDINGS  Left Ventricle: Left ventricular ejection fraction, by estimation, is 60 to 65%. The left ventricle has normal function. The left ventricle has no regional  wall motion abnormalities. The left ventricular internal cavity size was normal in size. There is  no left ventricular hypertrophy. Left ventricular diastolic parameters are indeterminate. Elevated left ventricular end-diastolic pressure. Right Ventricle: The right ventricular size is normal. No increase in right ventricular wall thickness. Right ventricular systolic function is normal. There is moderately elevated pulmonary artery systolic pressure. The tricuspid regurgitant velocity is 3.47 m/s, and with an assumed right atrial pressure of 3 mmHg, the estimated right ventricular systolic pressure is 07.3 mmHg. Left Atrium: Left atrial size was mildly dilated. Right Atrium: Right atrial size was normal in size. Pericardium: The effusion is largest posterior to the LV and apically measuring 2.63cm at greatest diameter. THe IVC is dilated and plethoric but there is no significant change in MV inflow with respirations. The epigastric view is forshortened and therefore cannot accurately assess for RV diastolic collapse. A large pericardial effusion is present. The pericardial effusion is  circumferential. Mitral Valve: The mitral valve is normal in structure. Mild to moderate mitral annular calcification. Trivial mitral valve regurgitation. No evidence of mitral valve stenosis. Tricuspid Valve: The tricuspid valve is normal in structure. Tricuspid valve regurgitation is mild . No evidence of tricuspid stenosis. Aortic Valve: The aortic valve is tricuspid. There is moderate calcification of the aortic valve. There is moderate thickening of the aortic valve. Aortic valve regurgitation is not visualized. Mild to moderate aortic valve sclerosis/calcification is present, without any evidence of aortic stenosis. Aortic valve peak gradient measures 12.7 mmHg. Pulmonic Valve: The pulmonic valve was normal in structure. Pulmonic valve regurgitation is trivial. No evidence of pulmonic stenosis. Aorta: The aortic root is normal in  size and structure. Venous: The inferior vena cava is normal in size with greater than 50% respiratory variability, suggesting right atrial pressure of 3 mmHg. IAS/Shunts: No atrial level shunt detected by color flow Doppler. Additional Comments: There is a small pleural effusion in the left lateral region.  LEFT VENTRICLE PLAX 2D LVIDd:         3.50 cm  Diastology LVIDs:         2.40 cm  LV e' medial:    5.44 cm/s LV PW:         1.10 cm  LV E/e' medial:  26.3 LV IVS:        1.10 cm  LV e' lateral:   9.14 cm/s LVOT diam:     2.10 cm  LV E/e' lateral: 15.6 LV SV:         63 LV SV Index:   36 LVOT Area:     3.46 cm  RIGHT VENTRICLE RV Basal diam:  3.20 cm RV S prime:     12.20 cm/s TAPSE (M-mode): 3.0 cm LEFT ATRIUM             Index       RIGHT ATRIUM           Index LA diam:        3.60 cm 2.05 cm/m  RA Area:     17.50 cm LA Vol (A2C):   56.0 ml 31.94 ml/m RA Volume:   51.50 ml  29.38 ml/m LA Vol (A4C):   68.7 ml 39.19 ml/m LA Biplane Vol: 61.9 ml 35.31 ml/m  AORTIC VALVE AV Area (Vmax): 2.34 cm AV Vmax:        178.00 cm/s AV Peak Grad:   12.7 mmHg LVOT Vmax:      120.00 cm/s LVOT Vmean:     68.800 cm/s LVOT VTI:       0.181 m  AORTA Ao Root diam: 2.80 cm MITRAL VALVE                TRICUSPID VALVE MV Area (PHT): 10.25 cm    TR Peak grad:   48.2 mmHg MV Decel Time: 74 msec      TR Vmax:        347.00 cm/s MV E velocity: 143.00 cm/s MV A velocity: 45.30 cm/s   SHUNTS MV E/A ratio:  3.16         Systemic VTI:  0.18 m                             Systemic Diam: 2.10 cm Armanda Magic MD Electronically signed by Armanda Magic MD Signature Date/Time: 06/20/2020/12:17:09 PM    Final    ECHOCARDIOGRAM LIMITED  Result Date: 06/23/2020  ECHOCARDIOGRAM LIMITED REPORT   Patient Name:   Robin Mcclure Date of Exam: 06/23/2020 Medical Rec #:  [id number]     Height:       65.0 in Accession #:    [id number]    Weight:       150.6 lb Date of Birth:  05-Aug-1922      BSA:          1.753 m Patient Age:    87 years      BP:            152/71 mmHg Patient Gender: F             HR:           74 bpm. Exam Location:  Inpatient Procedure: Limited Echo, Color Doppler and Cardiac Doppler                                MODIFIED REPORT:      This report was modified by Dorris Carnes MD on 06/23/2020 due to complete                                   impression.  Indications:     I31.3 Pericardial effusion  History:         Patient has prior history of Echocardiogram examinations, most                  recent 06/20/2020. Risk Factors:Hypertension, Diabetes and                  Dyslipidemia.  Sonographer:     Raquel Sarna Senior RDCS Referring Phys:  [place] Diagnosing Phys: Dorris Carnes MD  Sonographer Comments: Limited to recheck pericardial effusion. IMPRESSIONS  1. Left ventricular ejection fraction, by estimation, is 60 to 65%. The left ventricle has normal function.  2. Right ventricular systolic function is normal. The right ventricular size is normal.  3. Large pericardial effusion that is mostly posterior to LV Does not appear by echo criteria (mitral inflow, no RV indentation, normal IVC) to be causing hemodynamic compromise. Compared to images from echo on 06/20/20, effusion appears smaller . Large pericardial effusion.  4. The mitral valve is abnormal. not assessed mitral valve regurgitation.  5. Tricuspid valve regurgitation not assessed.  6. The aortic valve is abnormal. Aortic valve regurgitation not assessed. Mild aortic valve sclerosis is present, with no evidence of aortic valve stenosis.  7. Pulmonic valve regurgitation not assessed.  8. The inferior vena cava is normal in size with greater than 50% respiratory variability, suggesting right atrial pressure of 3 mmHg. FINDINGS  Left Ventricle: Left ventricular ejection fraction, by estimation, is 60 to 65%. The left ventricle has normal function. Right Ventricle: The right ventricular size is normal. No increase in right ventricular wall thickness. Right ventricular systolic function is  normal. Left Atrium: Left atrial size was normal in size. Right Atrium: Right atrial size was normal in size. Pericardium: Large pericardial effusion that is mostly posterior to LV Does not appear by echo criteria (mitral inflow, no RV indentation, normal IVC) to be causing hemodynamic compromise. Compared to images from echo on 06/20/20, effusion appears smaller. A large pericardial effusion is present. Mitral Valve: The mitral valve is abnormal. Mild to moderate mitral annular calcification. Not assessed mitral valve regurgitation. Tricuspid Valve: Tricuspid valve regurgitation not  assessed. Aortic Valve: The aortic valve is abnormal. Aortic valve regurgitation not assessed. Mild aortic valve sclerosis is present, with no evidence of aortic valve stenosis. Pulmonic Valve: The pulmonic valve was normal in structure. Pulmonic valve regurgitation not assessed. Aorta: The aortic root is normal in size and structure. Venous: The inferior vena cava is normal in size with greater than 50% respiratory variability, suggesting right atrial pressure of 3 mmHg. IAS/Shunts: The interatrial septum was not assessed. Dorris Carnes MD Electronically signed by Dorris Carnes MD Signature Date/Time: 06/23/2020/1:16:07 PM    Final (Updated)     Subjective: Feels less cough and less dyspnea, though has decreased exertional capacity from her already limited baseline. Did well with PT and has family to assist at home. Did not qualify for oxygen on exertion.  Discharge Exam: Vitals:   06/23/20 0745 06/23/20 1236  BP: (!) 152/71 (!) 150/76  Pulse: 61 60  Resp: 14 20  Temp: 97.7 F (36.5 C) 98.5 F (36.9 C)  SpO2: 94% 93%   General: Pt is alert, awake, not in acute distress Cardiovascular: Regular with muffled S1, S2, slight rub noted. Respiratory: CTA bilaterally, no wheezing, no rhonchi Abdominal: Soft, NT, ND, bowel sounds + Extremities: No pitting edema, no cyanosis  Labs: BNP (last 3 results) Recent Labs     06/20/20 1030  BNP [id number]*   Basic Metabolic Panel: Recent Labs  Lab 06/20/20 1030 06/20/20 1455 06/21/20 0315 06/22/20 0304 06/23/20 0214  NA 139  --  142 141 140  K 2.7*  --  3.6 3.7 3.9  CL 103  --  106 106 106  CO2 23  --  26 23 23   GLUCOSE 174*  --  182* 228* 197*  BUN 12  --  14 23 30*  CREATININE 0.69  --  0.79 1.13* 1.23*  CALCIUM 10.1  --  9.9 10.0 10.8*  MG  --  1.6*  --  1.5*  --   PHOS  --  2.9  --   --   --    Liver Function Tests: Recent Labs  Lab 06/20/20 1030  AST 18  ALT 18  ALKPHOS 83  BILITOT 0.9  PROT 6.5  ALBUMIN 2.5*   No results for input(s): LIPASE, AMYLASE in the last 168 hours. No results for input(s): AMMONIA in the last 168 hours. CBC: Recent Labs  Lab 06/20/20 1030  WBC 9.3  NEUTROABS 7.1  HGB 10.3*  HCT 34.0*  MCV 89.5  PLT 400   Cardiac Enzymes: No results for input(s): CKTOTAL, CKMB, CKMBINDEX, TROPONINI in the last 168 hours. BNP: Invalid input(s): POCBNP CBG: Recent Labs  Lab 06/22/20 1140 06/22/20 1552 06/22/20 2122 06/23/20 0742 06/23/20 1233  GLUCAP 184* 154* 211* 160* 180*   D-Dimer No results for input(s): DDIMER in the last 72 hours. Hgb A1c Recent Labs    06/20/20 2104  HGBA1C 7.3*   Lipid Profile No results for input(s): CHOL, HDL, LDLCALC, TRIG, CHOLHDL, LDLDIRECT in the last 72 hours. Thyroid function studies No results for input(s): TSH, T4TOTAL, T3FREE, THYROIDAB in the last 72 hours.  Invalid input(s): FREET3 Anemia work up No results for input(s): VITAMINB12, FOLATE, FERRITIN, TIBC, IRON, RETICCTPCT in the last 72 hours. Urinalysis    Component Value Date/Time   COLORURINE YELLOW 06/20/2020 1654   APPEARANCEUR CLEAR 06/20/2020 1654   LABSPEC 1.033 (H) 06/20/2020 1654   PHURINE 5.0 06/20/2020 1654   GLUCOSEU NEGATIVE 06/20/2020 1654   HGBUR NEGATIVE 06/20/2020 1654   BILIRUBINUR NEGATIVE  06/20/2020 [place] 06/20/2020 1654   PROTEINUR NEGATIVE 06/20/2020 1654    NITRITE POSITIVE (A) 06/20/2020 1654   LEUKOCYTESUR MODERATE (A) 06/20/2020 1654    Microbiology Recent Results (from the past 240 hour(s))  SARS Coronavirus 2 by RT PCR (hospital order, performed in [facility] hospital lab) Nasopharyngeal Nasopharyngeal Swab     Status: None   Collection Time: 06/20/20 10:30 AM   Specimen: Nasopharyngeal Swab  Result Value Ref Range Status   SARS Coronavirus 2 NEGATIVE NEGATIVE Final    Comment: (NOTE) SARS-CoV-2 target nucleic acids are NOT DETECTED.  The SARS-CoV-2 RNA is generally detectable in upper and lower respiratory specimens during the acute phase of infection. The lowest concentration of SARS-CoV-2 viral copies this assay can detect is 250 copies / mL. A negative result does not preclude SARS-CoV-2 infection and should not be used as the sole basis for treatment or other patient management decisions.  A negative result may occur with improper specimen collection / handling, submission of specimen other than nasopharyngeal swab, presence of viral mutation(s) within the areas targeted by this assay, and inadequate number of viral copies (<250 copies / mL). A negative result must be combined with clinical observations, patient history, and epidemiological information.  Fact Sheet for Patients:   StrictlyIdeas.no  Fact Sheet for Healthcare Providers: BankingDealers.co.za  This test is not yet approved or  cleared by the [place] FDA and has been authorized for detection and/or diagnosis of SARS-CoV-2 by FDA under an Emergency Use Authorization (EUA).  This EUA will remain in effect (meaning this test can be used) for the duration of the COVID-19 declaration under Section 564(b)(1) of the Act, 21 U.S.C. section 360bbb-3(b)(1), unless the authorization is terminated or revoked sooner.  Performed at [facility], [place] [address], [place], [place] [address]     Time coordinating  discharge: Approximately 40 minutes  Patrecia Pour, MD  [facility] 06/23/2020, 2:51 PM

## 2020-06-23 NOTE — TOC Initial Note (Signed)
Transition of Care [facility]) - Initial/Assessment Note    Patient Details  Name: Robin Mcclure MRN: [id number] Date of Birth: September 06, 1922  [facility]) CM/SW Contact:    Bethena Roys, RN Phone Number: 06/23/2020, 12:22 PM  Clinical Narrative:  Case Manager received consult for home health needs- Prior to arrival patient was from home with the support of daughter. Patient will return home to [place] with daughter. Case Manager discussed home health needs with the patient and daughter. Patient will benefit from registered nurse, physical therapy, and a home health adie. Medicare.gov list provided to the daughter- she did not have a preference. Case Manager called [facility] and the agency is in network with her insurance. Referral sent to [facility] and start of care to begin within 24-48 hours post transition home. Daughter to provide transportation home via private vehicle once stable.                Expected Discharge Plan: [place] Barriers to Discharge: No Barriers Identified   Patient Goals and CMS Choice Patient states their goals for this hospitalization and ongoing recovery are:: to return home CMS Medicare.gov Compare Post Acute Care list provided to:: Patient Choice offered to / list presented to : [place]  Expected Discharge Plan and Services Expected Discharge Plan: [place] In-house Referral: NA Discharge Planning Services: CM Consult Post Acute Care Choice: [place] arrangements for the past 2 months: Single Family Home Expected Discharge Date: 06/23/20               DME Arranged: N/A DME Agency: NA       HH Arranged: RN,Disease Management,PT,Nurse's Aide [place] Agency: [place] Date [facility] Agency Contacted: 06/23/20 Time [place]: 1221 Representative spoke with at [place]: Tommi Rumps  Prior Living Arrangements/Services Living arrangements for the past 2 months: [place] with:: Adult Children Patient language and need for interpreter reviewed:: Yes        Need for Family Participation in Patient Care: Yes (Comment) Care giver support system in place?: Yes (comment)   Criminal Activity/Legal Involvement Pertinent to Current Situation/Hospitalization: No - Comment as needed  Activities of Daily Living Home Assistive Devices/Equipment: Walker (specify type),Cane (specify quad or straight),Hearing aid ADL Screening (condition at time of admission) Patient's cognitive ability adequate to safely complete daily activities?: Yes Is the patient deaf or have difficulty hearing?: Yes Does the patient have difficulty seeing, even when wearing glasses/contacts?: No Does the patient have difficulty concentrating, remembering, or making decisions?: No Patient able to express need for assistance with ADLs?: Yes Does the patient have difficulty dressing or bathing?: Yes Independently performs ADLs?: No Communication: Needs assistance Is this a change from baseline?: Pre-admission baseline Dressing (OT): Needs assistance Is this a change from baseline?: Pre-admission baseline Grooming: Needs assistance Is this a change from baseline?: Pre-admission baseline Feeding: Independent Bathing: Needs assistance Is this a change from baseline?: Pre-admission baseline Toileting: Needs assistance Is this a change from baseline?: Pre-admission baseline In/Out Bed: Needs assistance Is this a change from baseline?: Pre-admission baseline Walks in Home: Independent with device (comment) Does the patient have difficulty walking or climbing stairs?: Yes Weakness of Legs: Both Weakness of Arms/Hands: None  Permission Sought/Granted Permission sought to share information with : Family Supports,Case Nature conservation officer Permission granted to share information with : Yes, Verbal Permission Granted     Permission granted to share info w AGENCY:  [facility]  Emotional Assessment Appearance:: Appears stated age Attitude/Demeanor/Rapport: Engaged Affect (typically observed): Appropriate Orientation: : Oriented to  Time,Oriented to Place,Oriented to Self,Oriented to Situation Alcohol / Substance Use: Not Applicable Psych Involvement: No (comment)  Admission diagnosis:  Pericardial effusion [I31.3] Pericarditis [I31.9] Acute respiratory failure, unspecified whether with hypoxia or hypercapnia ([place]) [J96.00] Patient Active Problem List   Diagnosis Date Noted  . Pericarditis 06/20/2020  . Pericardial effusion   . Presbycusis of both ears 09/29/2018   PCP:  Lujean Amel, MD Pharmacy:   [id number] - [place], [place] [address] Phone: [phone] Fax: [phone]  Readmission Risk Interventions No flowsheet data found.

## 2020-06-23 NOTE — Progress Notes (Signed)
Echocardiogram 2D Echocardiogram has been performed.  One[place] Deputy Chalmer Zheng 06/23/2020, 8:53 AM

## 2020-06-23 NOTE — Progress Notes (Signed)
Patient discharged this afternoon after echocardiogram revealed improvement in effusion, clear by cardiology. The patient and her sole caretaker (daughter) are reluctant to return home, have intermittent respiratory distress exacerbated by coughing spells that are not productive. Improves gradually. Hypoxia has been noted this afternoon whereas it was not noted this morning. Her exam is stable.   Overall, I've provided reassurance to the patient and daughter as the process causing these symptoms has been slow to accumulate and is expected to have a slow improvement. On the other hand, she may be a bit worse off now than she was this morning. We will keep overnight, arrange DME including supplemental oxygen, and monitor for continued worsening in this 87 year old lady with large pericardial effusion with very slim physiologic reserve.   Vance Gather, MD 06/23/2020 4:29 PM

## 2020-06-23 NOTE — Care Management (Signed)
    Durable Medical Equipment  (From admission, onward)         Start     Ordered   06/23/20 1455  For home use only DME Hospital bed  Once       Question Answer Comment  Length of Need 6 Months   Patient has (list medical condition): Pericarditis, pericardial effusion   The above medical condition requires: Patient requires the ability to reposition frequently   Bed type Semi-electric      06/23/20 1454

## 2020-06-23 NOTE — Progress Notes (Signed)
Physical Therapy Treatment Patient Details Name: Robin Mcclure MRN: [id number] DOB: 05/13/1923 Today's Date: 06/23/2020    History of Present Illness Pt presented with dyspnea and found to have subacute pericarditis and large pericardial effusion. PMH - DM, peripheral neuropathy, HTN, colon CA, ckd.    PT Comments    Pt making good progress with mobility. Does not require supplemental O2 with SpO2 94% at rest on RA and greater than or equal to 92% with mobility . Updated DC recommendations to include HHPT to work toward pt returning to the level she was prior to Thanksgiving. Pt eager to return home hopefully today.    Follow Up Recommendations  Supervision - Intermittent;Home health PT     Equipment Recommendations  None recommended by PT    Recommendations for Other Services       Precautions / Restrictions Precautions Precautions: Fall    Mobility  Bed Mobility Overal bed mobility: Needs Assistance Bed Mobility: Supine to Sit;Sit to Supine     Supine to sit: HOB elevated;Min assist Sit to supine: Min assist   General bed mobility comments: Pt used my hand to pull trunk up into sitting. Assist to bring legs back into bed returning to supine.  Transfers Overall transfer level: Needs assistance Equipment used: Rolling walker (2 wheeled) Transfers: Sit to/from Stand Sit to Stand: Supervision         General transfer comment: Assist for lines/safety  Ambulation/Gait Ambulation/Gait assistance: Supervision Gait Distance (Feet): 50 Feet Assistive device: Rolling walker (2 wheeled) Gait Pattern/deviations: Step-through pattern;Decreased stride length;Trunk flexed Gait velocity: decr Gait velocity interpretation: <1.31 ft/sec, indicative of household ambulator General Gait Details: Assist for safety.   Stairs             Wheelchair Mobility    Modified Rankin (Stroke Patients Only)       Balance Overall balance assessment: Needs  assistance Sitting-balance support: No upper extremity supported;Feet supported Sitting balance-Leahy Scale: Good     Standing balance support: Bilateral upper extremity supported Standing balance-Leahy Scale: Poor Standing balance comment: walker and supervision for static standing                            Cognition Arousal/Alertness: Awake/alert Behavior During Therapy: WFL for tasks assessed/performed Overall Cognitive Status: Within Functional Limits for tasks assessed                                        Exercises      General Comments General comments (skin integrity, edema, etc.): On RA SpO2 92%. Dyspnea 2/4. Left O2 off of pt and nurse aware.      Pertinent Vitals/Pain Pain Assessment: Faces Faces Pain Scale: No hurt    Home Living                      Prior Function            PT Goals (current goals can now be found in the care plan section) Acute Rehab PT Goals Patient Stated Goal: return home Progress towards PT goals: Progressing toward goals    Frequency    Min 3X/week      PT Plan Discharge plan needs to be updated    Co-evaluation              AM-PAC PT "6 Clicks" Mobility  Outcome Measure  Help needed turning from your back to your side while in a flat bed without using bedrails?: None Help needed moving from lying on your back to sitting on the side of a flat bed without using bedrails?: A Little Help needed moving to and from a bed to a chair (including a wheelchair)?: A Little Help needed standing up from a chair using your arms (e.g., wheelchair or bedside chair)?: A Little Help needed to walk in hospital room?: A Little Help needed climbing 3-5 steps with a railing? : A Little 6 Click Score: 19    End of Session Equipment Utilized During Treatment: Gait belt Activity Tolerance: Patient limited by fatigue (and dyspnea) Patient left: in chair;with call bell/phone within reach;with chair  alarm set   PT Visit Diagnosis: Other abnormalities of gait and mobility (R26.89);Muscle weakness (generalized) (M62.81)     Time: 9735-3299 PT Time Calculation (min) (ACUTE ONLY): 17 min  Charges:  $Gait Training: 8-22 mins                     [place] Pager [phone] Office [place] 06/23/2020, 11:05 AM

## 2020-06-24 ENCOUNTER — Telehealth: Payer: Self-pay | Admitting: *Deleted

## 2020-06-24 ENCOUNTER — Other Ambulatory Visit: Payer: Self-pay | Admitting: *Deleted

## 2020-06-24 DIAGNOSIS — I313 Pericardial effusion (noninflammatory): Secondary | ICD-10-CM

## 2020-06-24 DIAGNOSIS — I3139 Other pericardial effusion (noninflammatory): Secondary | ICD-10-CM

## 2020-06-24 LAB — BASIC METABOLIC PANEL
Anion gap: 11 (ref 5–15)
BUN: 31 mg/dL — ABNORMAL HIGH (ref 8–23)
CO2: 22 mmol/L (ref 22–32)
Calcium: 10.5 mg/dL — ABNORMAL HIGH (ref 8.9–10.3)
Chloride: 106 mmol/L (ref 98–111)
Creatinine, Ser: 1.29 mg/dL — ABNORMAL HIGH (ref 0.44–1.00)
GFR, Estimated: 38 mL/min — ABNORMAL LOW (ref >60)
Glucose, Bld: 187 mg/dL — ABNORMAL HIGH (ref 70–99)
Potassium: 3.7 mmol/L (ref 3.5–5.1)
Sodium: 139 mmol/L (ref 135–145)

## 2020-06-24 LAB — GLUCOSE, CAPILLARY
Glucose-Capillary: 211 mg/dL — ABNORMAL HIGH (ref 70–99)
Glucose-Capillary: 173 mg/dL — ABNORMAL HIGH (ref 70–99)
Glucose-Capillary: 151 mg/dL — ABNORMAL HIGH (ref 70–99)

## 2020-06-24 MED ORDER — IBUPROFEN 600 MG PO TABS
600.0000 mg | ORAL_TABLET | Freq: Three times a day (TID) | ORAL | Status: DC
Start: 2020-06-24 — End: 2020-06-24
  Administered 2020-06-24: 600 mg via ORAL
  Filled 2020-06-24: qty 1

## 2020-06-24 MED ORDER — DEXTROMETHORPHAN POLISTIREX ER 30 MG/5ML PO SUER: 15.0000 mg | Freq: Four times a day (QID) | ORAL | 0 refills | Status: DC | PRN

## 2020-06-24 NOTE — Telephone Encounter (Signed)
-----   Message from Robin Mcclure sent at 06/23/2020  2:31 PM EST ----- Regarding: Follow up Hey!  This lady needs a limited echo in a week and to see an APP. I couldn't find any echo slots. Can you please help me arrange that? Pt will also follow up with [place] in 6 wks.   I really appreciate your help!!  Wannetta Sender

## 2020-06-24 NOTE — TOC Transition Note (Signed)
Transition of Care [facility]) - CM/SW Discharge Note   Patient Details  Name: Robin Mcclure MRN: [id number] Date of Birth: [birth year]  [facility]) CM/SW Contact:  Pollie Friar, RN Phone Number: 06/24/2020, 12:55 PM   Clinical Narrative:    Patient is discharging home with [facility] services through [place]. Cory with [facility] aware of d/c home.  Hospital bed to be delivered to the home at a later date. Daughter has to make space for bed.  Oxygen for home has been ordered with transport tank delivered to the room. Concentrator to be delivered to the home.  Pt's daughter to transport patient home.   Final next level of care: [place] Barriers to Discharge: No Barriers Identified   Patient Goals and CMS Choice Patient states their goals for this hospitalization and ongoing recovery are:: to return home CMS Medicare.gov Compare Post Acute Care list provided to:: Patient Represenative (must comment) Choice offered to / list presented to : [place]  Discharge Placement                       Discharge Plan and Services In-house Referral: NA Discharge Planning Services: CM Consult Post Acute Care Choice: Home Health          DME Arranged: Oxygen DME Agency: [employer] Date DME Agency Contacted: 06/24/20 Time DME Agency Contacted: [phone] Representative spoke with at DME Agency: Freda Munro HH Arranged: RN,Disease Management,PT,Nurse's Aide [place]: [place] Date [place]: 06/23/20 Time [place]: 1221 Representative spoke with at [place]: [place] ([place]) Interventions     Readmission Risk Interventions No flowsheet data found.

## 2020-06-24 NOTE — Telephone Encounter (Signed)
Follow up scheduled

## 2020-06-24 NOTE — Progress Notes (Signed)
SATURATION QUALIFICATIONS: (This note is used to comply with regulatory documentation for home oxygen)  Patient Saturations on Room Air at Rest = 85-87%  Patient Saturations on Room Air while Ambulating = Not evaluated as pt 85% on room air at resting state  Patient Saturations on 2 Liters of oxygen while Ambulating = 90-94%  Please briefly explain why patient needs home oxygen: Pt SpO2 on room air at resting state is 85-87%

## 2020-06-24 NOTE — Discharge Summary (Signed)
Physician Discharge Summary  Robin Mcclure [id number] DOB: [birth year] DOA: 06/20/2020  PCP: Lujean Amel, MD  Admit date: 06/20/2020 Discharge date: 06/24/2020  Admitted From: Home Disposition: Home   Recommendations for Outpatient Follow-up:  1. Follow up with cardiology within the next week with repeat BMP at follow up and repeat echocardiogram in 2 weeks. 2. Due to renal impairment, janumet was stopped and [place] alone was started.  3. Due to renal impairment losartan and lasix have been held pending repeat BMP and follow up.    Home Health: PT Equipment/Devices: None, no hypoxia with exertion Discharge Condition: Stable CODE STATUS: Full Diet recommendation: Heart healthy, carb-modified  Brief/Interim Summary: Robin Mcclure is a 87 y.o. female with a history of T2DM with peripheral neuropathy, HTN, HLD, colon CA s/p colectomy and chemotherapy in remission who presented to the ED 1/31 with progressive dyspnea. Symptoms began when she felt like she had a cold in late November, tested negative for covid twice with reassuring CXR. Infectious symptoms improved, though cough and dyspnea have continued prompting presentation to PCP who ordered CT chest. This showed large pericardial effusion and cardiomegaly, small left pleural effusion. She was contacted and referred to the ED where echocardiogram confirmed predominantly posterior and lateral pericardial effusion without vital sign evidence of tamponade. Cardiology was consulted, NSAID, colchicine, and IV lasix were given. Cough and dyspnea have improved, hypoxia resolved. Renal function worsened, so ARB was held, lasix ultimately stopped with plateauing of creatinine. Repeat echocardiogram shows decreased size of effusion. The patient has been cleared for discharge by cardiology to continue colchicine and ibuprofen and will need repeat renal function check at follow up.   Discharge Diagnoses:  Active Problems:   Pericarditis  Acute  hypoxic respiratory failure due to pericardial and pleural effusions: This is mild, though does have some hypoxia intermittently documented, down to mid-80%'s on 2/3. Due to improvement in dyspnea, will be treated with home oxygen pending follow up.   Subacute pericarditis with large pericardial effusion: Post-viral most likely etiology. Size is large though not associated with tamponade physiology, and not amenable to pericardiocentesis at this time.  - Repeat echocardiogram 06/23/2020 shows decreased size of pericardial effusion per Dr. Stanford Breed. - Continue ibuprofen, discussed with Dr. Stanford Breed, will decrease dose to 600mg  (~10mg /kg) given renail impairment and advanced age - Continue colchicine at 0.6mg  daily - Holding lasix pending cardiology follow up  AKI on stage IIIb CKD: This is based on current available renal function testing. SCr up with IV diuresis. - Stopped ARB for now - Hold lasix for now  Demand myocardial ischemia: Mild troponin elevation without STEMI on ECG.  - Deferring work up currently per cardiology.  HTN:  - Continue nifedipine. - Hold ARB pending repeat BMP  GERD:  - Certainly needs to continue protonix while on ibuprofen. This was continued at discharge.  Hypokalemia: Resolved. Since not continuing lasix for now, will not continue standing supplement.  Hypomagnesemia:  - Supplemented  Bacteriuria, pyuria: Pt with no symptoms or signs of UTI. This will be considered colonization and not treated at this time.  Cough: Possibly related to pericardial process. Also possibly related to GERD.  - Improved, can continue antitussive prn  T2DM: HbA1c 7.3% indicating adequate control in 87yo F.  - Continue home meds, no hypoglycemia reported.  Discharge Instructions Discharge Instructions    Diet - low sodium heart healthy   Complete by: As directed    Discharge instructions   Complete by: As directed    The echocardiogram  demonstrated a decrease in the  size of the pericardial effusion (fluid around the heart). For pericarditis, continue taking ibuprofen 600mg  three times per day and colchicine once per day.   Because you are taking ibuprofen, stop taking aspirin for now.   Your kidney function is a bit worse, so you should stop taking lasix, losartan, and janumet FOR NOW. You may restart these at some point, and will need a recheck of the kidney function at follow up. If you are not contacted to schedule a follow up appointment, please call the number provided.   STOP JANUMET. START JANUVIA. To replace janumet, you should take just the januvia portion of that medication. This was sent to your pharmacy as well.   If your symptoms worsen, specifically shortness of breath, chest pain, leg swelling, decreased urine output, or change in mental status seek medical attention right away.   Increase activity slowly   Complete by: As directed      Allergies as of 06/24/2020   No Known Allergies     Medication List    STOP taking these medications   aspirin 81 MG EC tablet   furosemide 40 MG tablet Commonly known as: LASIX   losartan 25 MG tablet Commonly known as: COZAAR   sitaGLIPtin-metformin 50-1000 MG tablet Commonly known as: JANUMET     TAKE these medications   acetaminophen 325 MG tablet Commonly known as: TYLENOL Take 650 mg by mouth every 6 (six) hours as needed for mild pain.   albuterol 108 (90 Base) MCG/ACT inhaler Commonly known as: VENTOLIN HFA Inhale 1-2 puffs into the lungs every 6 (six) hours as needed for wheezing or shortness of breath.   B COMPLEX PO Take 1 tablet by mouth daily.   CALCIUM/MAGNESIUM/ZINC PO Take 1 tablet by mouth 2 (two) times daily.   Cholecalciferol 25 MCG (1000 UT) tablet Take 1,000 Units by mouth daily.   colchicine 0.6 MG tablet Take 1 tablet (0.6 mg total) by mouth daily.   [employer] SPECTRAVITE SENIOR PO Take 1 tablet by mouth daily.   dextromethorphan 30 MG/5ML liquid Commonly  known as: DELSYM Take 2.5 mLs (15 mg total) by mouth every 6 (six) hours as needed for cough.   Fluticasone-Salmeterol 500-50 MCG/DOSE Aepb Commonly known as: ADVAIR Inhale 1 puff into the lungs 2 (two) times daily.   glipiZIDE 2.5 MG 24 hr tablet Commonly known as: GLUCOTROL XL Take 2.5 mg by mouth daily with breakfast.   ibuprofen 600 MG tablet Commonly known as: ADVIL Take 1 tablet (600 mg total) by mouth 3 (three) times daily.   NIFEdipine 90 MG 24 hr tablet Commonly known as: ADALAT CC Take 90 mg by mouth daily.   ondansetron 4 MG tablet Commonly known as: ZOFRAN Take 4 mg by mouth every 8 (eight) hours as needed for nausea.   pantoprazole 40 MG tablet Commonly known as: PROTONIX Take 40 mg by mouth daily.   pravastatin 20 MG tablet Commonly known as: PRAVACHOL Take 20 mg by mouth daily.   sitaGLIPtin 50 MG tablet Commonly known as: Januvia Take 1 tablet (50 mg total) by mouth daily.   sucralfate 1 GM/10ML suspension Commonly known as: Carafate Take 10 mLs (1 g total) by mouth 4 (four) times daily -  with meals and at bedtime. What changed: when to take this            Durable Medical Equipment  (From admission, onward)         Start  Ordered   06/23/20 1546  For home use only DME oxygen  Once       Question Answer Comment  Length of Need 6 Months   Mode or (Route) Nasal cannula   Liters per Minute 2   Frequency Continuous (stationary and portable oxygen unit needed)   Oxygen delivery system Gas      06/23/20 1546   06/23/20 1455  For home use only DME Hospital bed  Once       Question Answer Comment  Length of Need 6 Months   Patient has (list medical condition): Pericarditis, pericardial effusion   The above medical condition requires: Patient requires the ability to reposition frequently   Bed type Semi-electric      06/23/20 1454          Follow-up Information    [facility] Follow up.   Specialty: Home Health  Services Why: Home Health registered nurse for disease management and v/s. Physical Therapy for evaluation and treatment and an Aide for a bath-office to call with visit times.  Contact information: [place] [address] [phone]        [name], [name], MD Follow up.   Specialty: Family Medicine Contact information: [place] [address] [phone]        Lelon Perla, MD. Call in 1 week(s).   Specialty: Cardiology Why: call if not contacted to schedule an appointment Contact information: [place] [address] [phone]        [facility] Follow up.   Why: Hospital Bed- to be delivered to the home.  Contact information: [place] [address] [phone]              No Known Allergies  Consultations:  Cardiology  Procedures/Studies: CT CHEST WO CONTRAST  Result Date: 06/14/2020 CLINICAL DATA:  Cough and fatigue for 2 months. Personal history of colon carcinoma. Former smoker. EXAM: CT CHEST WITHOUT CONTRAST TECHNIQUE: Multidetector CT imaging of the chest was performed following the standard protocol without IV contrast. COMPARISON:  None. FINDINGS: Cardiovascular: A large pericardial effusion is seen. Mild-to-moderate cardiomegaly is noted. Aortic and coronary atherosclerotic calcification also demonstrated. Mediastinum/Nodes: No masses or pathologically enlarged lymph nodes identified on this unenhanced exam. Lungs/Pleura: A small left pleural effusion is seen, with atelectasis in the dependent left lower lobe. Tiny right pleural effusion versus pleural thickening is demonstrated, with mild right lung parenchymal scarring. No evidence of pulmonary consolidation or mass. No evidence of central airway obstruction. Upper Abdomen:  Unremarkable. Musculoskeletal: No suspicious bone lesions. Wedge compression fracture deformities of the T1 and  T2 vertebral bodies are seen which appear old. IMPRESSION: Large pericardial effusion. Small left pleural effusion and mild left lower lobe atelectasis. Right lung scarring and tiny right pleural effusion versus pleural thickening. Aortic Atherosclerosis (ICD10-I70.0). Aortic atherosclerotic calcification noted. Electronically Signed   By: Marlaine Hind M.D.   On: 06/14/2020 15:22   CT Angio Chest PE W and/or Wo Contrast  Result Date: 06/20/2020 CLINICAL DATA:  Dyspnea, lethargy EXAM: CT ANGIOGRAPHY CHEST WITH CONTRAST TECHNIQUE: Multidetector CT imaging of the chest was performed using the standard protocol during bolus administration of intravenous contrast. Multiplanar CT image reconstructions and MIPs were obtained to evaluate the vascular anatomy. CONTRAST:  52mL OMNIPAQUE IOHEXOL 350 MG/ML SOLN COMPARISON:  06/14/2020 FINDINGS: Cardiovascular: There is adequate opacification of the pulmonary arterial tree. There is no intraluminal filling  defect identified to suggest acute pulmonary embolism. The central pulmonary arteries are enlarged, similar to that noted on prior examination, in keeping with changes of pulmonary arterial hypertension. Extensive multi-vessel coronary artery calcification again noted. Global cardiac size within normal limits. Large pericardial effusion is again seen, unchanged from prior examination. Moderate atherosclerotic calcification of the thoracic aorta again identified. No aortic aneurysm. Mediastinum/Nodes: No enlarged mediastinal, hilar, or axillary lymph nodes. Thyroid gland, trachea, and esophagus demonstrate no significant findings. Lungs/Pleura: Small bilateral pleural effusions are present, new since prior examination. Mild bibasilar associated compressive atelectasis. No superimposed confluent pulmonary infiltrate. No pneumothorax. Central airways are widely patent. Upper Abdomen: No acute abnormality. Musculoskeletal: Anterior wedge compression fractures of T4 and T5 are  again seen, chronic in nature, with resultant focal thoracic kyphosis. Review of the MIP images confirms the above findings. IMPRESSION: No pulmonary embolism. Extensive multi-vessel coronary artery calcification. Morphologic changes of pulmonary arterial hypertension. Stable large pericardial effusion. Interval development of small bilateral pleural effusions. Aortic Atherosclerosis (ICD10-I70.0). Electronically Signed   By: Fidela Salisbury MD   On: 06/20/2020 13:01   DG Chest Portable 1 View  Result Date: 06/20/2020 CLINICAL DATA:  Shortness of breath. EXAM: PORTABLE CHEST 1 VIEW COMPARISON:  April 19, 2020. FINDINGS: Stable cardiomegaly. No pneumothorax or pleural effusion is noted. Lungs are clear. Bony thorax is unremarkable. IMPRESSION: No active disease. Electronically Signed   By: Marijo Conception M.D.   On: 06/20/2020 10:56   ECHOCARDIOGRAM COMPLETE  Result Date: 06/20/2020    ECHOCARDIOGRAM REPORT   Patient Name:   ANDREANNA MIKOLAJCZAK Date of Exam: 06/20/2020 Medical Rec #:  [id number]     Height:       63.0 in Accession #:    [id number]    Weight:       158.8 lb Date of Birth:  1923-02-12      BSA:          1.753 m Patient Age:    87 years      BP:           162/79 mmHg Patient Gender: F             HR:           88 bpm. Exam Location:  Inpatient Procedure: 2D Echo, Cardiac Doppler and Color Doppler STAT ECHO Indications:    Pericardial effusion  History:        Patient has no prior history of Echocardiogram examinations.                 Signs/Symptoms:Shortness of Breath and Pleural effusion.  Sonographer:    Dustin Flock Referring Phys: [id number] ADAM CURATOLO IMPRESSIONS  1. Left ventricular ejection fraction, by estimation, is 60 to 65%. The left ventricle has normal function. The left ventricle has no regional wall motion abnormalities. Left ventricular diastolic parameters are indeterminate. Elevated left ventricular end-diastolic pressure.  2. Right ventricular systolic function is normal. The  right ventricular size is normal. There is moderately elevated pulmonary artery systolic pressure.  3. Left atrial size was mildly dilated.  4. The mitral valve is normal in structure. Trivial mitral valve regurgitation. No evidence of mitral stenosis.  5. The aortic valve is tricuspid. There is moderate calcification of the aortic valve. There is moderate thickening of the aortic valve. Aortic valve regurgitation is not visualized. Mild to moderate aortic valve sclerosis/calcification is present, without any evidence of aortic stenosis.  6. The inferior vena cava is normal in size with greater  than 50% respiratory variability, suggesting right atrial pressure of 3 mmHg.  7. Moderate to large pericardial effusion. The pericardial effusion is circumferential. The effusion is largest posterior to the LV and apically measuring 2.63cm at greatest diameter. THe IVC is dilated and plethoric but there is no significant change in MV inflow with respirations. The epigastric view is forshortened and therefore cannot accurately assess for RV diastolic collapse.  8. No prior echo to compare regarding pericardial effusion. FINDINGS  Left Ventricle: Left ventricular ejection fraction, by estimation, is 60 to 65%. The left ventricle has normal function. The left ventricle has no regional wall motion abnormalities. The left ventricular internal cavity size was normal in size. There is  no left ventricular hypertrophy. Left ventricular diastolic parameters are indeterminate. Elevated left ventricular end-diastolic pressure. Right Ventricle: The right ventricular size is normal. No increase in right ventricular wall thickness. Right ventricular systolic function is normal. There is moderately elevated pulmonary artery systolic pressure. The tricuspid regurgitant velocity is 3.47 m/s, and with an assumed right atrial pressure of 3 mmHg, the estimated right ventricular systolic pressure is 0000000 mmHg. Left Atrium: Left atrial size was  mildly dilated. Right Atrium: Right atrial size was normal in size. Pericardium: The effusion is largest posterior to the LV and apically measuring 2.63cm at greatest diameter. THe IVC is dilated and plethoric but there is no significant change in MV inflow with respirations. The epigastric view is forshortened and therefore cannot accurately assess for RV diastolic collapse. A large pericardial effusion is present. The pericardial effusion is circumferential. Mitral Valve: The mitral valve is normal in structure. Mild to moderate mitral annular calcification. Trivial mitral valve regurgitation. No evidence of mitral valve stenosis. Tricuspid Valve: The tricuspid valve is normal in structure. Tricuspid valve regurgitation is mild . No evidence of tricuspid stenosis. Aortic Valve: The aortic valve is tricuspid. There is moderate calcification of the aortic valve. There is moderate thickening of the aortic valve. Aortic valve regurgitation is not visualized. Mild to moderate aortic valve sclerosis/calcification is present, without any evidence of aortic stenosis. Aortic valve peak gradient measures 12.7 mmHg. Pulmonic Valve: The pulmonic valve was normal in structure. Pulmonic valve regurgitation is trivial. No evidence of pulmonic stenosis. Aorta: The aortic root is normal in size and structure. Venous: The inferior vena cava is normal in size with greater than 50% respiratory variability, suggesting right atrial pressure of 3 mmHg. IAS/Shunts: No atrial level shunt detected by color flow Doppler. Additional Comments: There is a small pleural effusion in the left lateral region.  LEFT VENTRICLE PLAX 2D LVIDd:         3.50 cm  Diastology LVIDs:         2.40 cm  LV e' medial:    5.44 cm/s LV PW:         1.10 cm  LV E/e' medial:  26.3 LV IVS:        1.10 cm  LV e' lateral:   9.14 cm/s LVOT diam:     2.10 cm  LV E/e' lateral: 15.6 LV SV:         63 LV SV Index:   36 LVOT Area:     3.46 cm  RIGHT VENTRICLE RV Basal diam:   3.20 cm RV S prime:     12.20 cm/s TAPSE (M-mode): 3.0 cm LEFT ATRIUM             Index       RIGHT ATRIUM  Index LA diam:        3.60 cm 2.05 cm/m  RA Area:     17.50 cm LA Vol (A2C):   56.0 ml 31.94 ml/m RA Volume:   51.50 ml  29.38 ml/m LA Vol (A4C):   68.7 ml 39.19 ml/m LA Biplane Vol: 61.9 ml 35.31 ml/m  AORTIC VALVE AV Area (Vmax): 2.34 cm AV Vmax:        178.00 cm/s AV Peak Grad:   12.7 mmHg LVOT Vmax:      120.00 cm/s LVOT Vmean:     68.800 cm/s LVOT VTI:       0.181 m  AORTA Ao Root diam: 2.80 cm MITRAL VALVE                TRICUSPID VALVE MV Area (PHT): 10.25 cm    TR Peak grad:   48.2 mmHg MV Decel Time: 74 msec      TR Vmax:        347.00 cm/s MV E velocity: 143.00 cm/s MV A velocity: 45.30 cm/s   SHUNTS MV E/A ratio:  3.16         Systemic VTI:  0.18 m                             Systemic Diam: 2.10 cm Fransico Him MD Electronically signed by Fransico Him MD Signature Date/Time: 06/20/2020/12:17:09 PM    Final    ECHOCARDIOGRAM LIMITED  Result Date: 06/23/2020    ECHOCARDIOGRAM LIMITED REPORT   Patient Name:   Robin Mcclure Date of Exam: 06/23/2020 Medical Rec #:  [id number]     Height:       65.0 in Accession #:    [id number]    Weight:       150.6 lb Date of Birth:  1923-01-02      BSA:          1.753 m Patient Age:    87 years      BP:           152/71 mmHg Patient Gender: F             HR:           74 bpm. Exam Location:  Inpatient Procedure: Limited Echo, Color Doppler and Cardiac Doppler                                MODIFIED REPORT:      This report was modified by Dorris Carnes MD on 06/23/2020 due to complete                                   impression.  Indications:     I31.3 Pericardial effusion  History:         Patient has prior history of Echocardiogram examinations, most                  recent 06/20/2020. Risk Factors:Hypertension, Diabetes and                  Dyslipidemia.  Sonographer:     Raquel Sarna Senior RDCS Referring Phys:  [place] Diagnosing Phys: Dorris Carnes MD  Sonographer Comments: Limited to recheck pericardial effusion. IMPRESSIONS  1. Left ventricular ejection fraction, by estimation, is 60 to 65%. The  left ventricle has normal function.  2. Right ventricular systolic function is normal. The right ventricular size is normal.  3. Large pericardial effusion that is mostly posterior to LV Does not appear by echo criteria (mitral inflow, no RV indentation, normal IVC) to be causing hemodynamic compromise. Compared to images from echo on 06/20/20, effusion appears smaller . Large pericardial effusion.  4. The mitral valve is abnormal. not assessed mitral valve regurgitation.  5. Tricuspid valve regurgitation not assessed.  6. The aortic valve is abnormal. Aortic valve regurgitation not assessed. Mild aortic valve sclerosis is present, with no evidence of aortic valve stenosis.  7. Pulmonic valve regurgitation not assessed.  8. The inferior vena cava is normal in size with greater than 50% respiratory variability, suggesting right atrial pressure of 3 mmHg. FINDINGS  Left Ventricle: Left ventricular ejection fraction, by estimation, is 60 to 65%. The left ventricle has normal function. Right Ventricle: The right ventricular size is normal. No increase in right ventricular wall thickness. Right ventricular systolic function is normal. Left Atrium: Left atrial size was normal in size. Right Atrium: Right atrial size was normal in size. Pericardium: Large pericardial effusion that is mostly posterior to LV Does not appear by echo criteria (mitral inflow, no RV indentation, normal IVC) to be causing hemodynamic compromise. Compared to images from echo on 06/20/20, effusion appears smaller. A large pericardial effusion is present. Mitral Valve: The mitral valve is abnormal. Mild to moderate mitral annular calcification. Not assessed mitral valve regurgitation. Tricuspid Valve: Tricuspid valve regurgitation not assessed. Aortic Valve: The aortic valve is abnormal. Aortic  valve regurgitation not assessed. Mild aortic valve sclerosis is present, with no evidence of aortic valve stenosis. Pulmonic Valve: The pulmonic valve was normal in structure. Pulmonic valve regurgitation not assessed. Aorta: The aortic root is normal in size and structure. Venous: The inferior vena cava is normal in size with greater than 50% respiratory variability, suggesting right atrial pressure of 3 mmHg. IAS/Shunts: The interatrial septum was not assessed. Dorris Carnes MD Electronically signed by Dorris Carnes MD Signature Date/Time: 06/23/2020/1:16:07 PM    Final (Updated)     Subjective: "I feel fine!" Ready to go home. Had a better night with delsym cough syrup. No chest pain or pressure.   Discharge Exam: Vitals:   06/24/20 0522 06/24/20 0800  BP: 125/73 (!) 149/71  Pulse: (!) 57 62  Resp: 20 20  Temp: 97.6 F (36.4 C) 98.7 F (37.1 C)  SpO2: 96% 94%   General: Pt is alert, awake, not in acute distress Cardiovascular: Regular with muffled S1, S2, slight rub noted. Respiratory: CTA bilaterally, no wheezing, no rhonchi Abdominal: Soft, NT, ND, bowel sounds + Extremities: No pitting edema, no cyanosis  Labs: BNP (last 3 results) Recent Labs    06/20/20 1030  BNP 160.1*   Basic Metabolic Panel: Recent Labs  Lab 06/20/20 1030 06/20/20 1455 06/21/20 0315 06/22/20 0304 06/23/20 0214 06/24/20 0307  NA 139  --  142 141 140 139  K 2.7*  --  3.6 3.7 3.9 3.7  CL 103  --  106 106 106 106  CO2 23  --  26 23 23 22   GLUCOSE 174*  --  182* 228* 197* 187*  BUN 12  --  14 23 30* 31*  CREATININE 0.69  --  0.79 1.13* 1.23* 1.29*  CALCIUM 10.1  --  9.9 10.0 10.8* 10.5*  MG  --  1.6*  --  1.5*  --   --   PHOS  --  2.9  --   --   --   --    Liver Function Tests: Recent Labs  Lab 06/20/20 1030  AST 18  ALT 18  ALKPHOS 83  BILITOT 0.9  PROT 6.5  ALBUMIN 2.5*   No results for input(s): LIPASE, AMYLASE in the last 168 hours. No results for input(s): AMMONIA in the last 168  hours. CBC: Recent Labs  Lab 06/20/20 1030  WBC 9.3  NEUTROABS 7.1  HGB 10.3*  HCT 34.0*  MCV 89.5  PLT 400   Cardiac Enzymes: No results for input(s): CKTOTAL, CKMB, CKMBINDEX, TROPONINI in the last 168 hours. BNP: Invalid input(s): POCBNP CBG: Recent Labs  Lab 06/23/20 0742 06/23/20 1233 06/23/20 1706 06/23/20 2152 06/24/20 0820  GLUCAP 160* 180* 168* 211* 173*   D-Dimer No results for input(s): DDIMER in the last 72 hours. Hgb A1c No results for input(s): HGBA1C in the last 72 hours. Lipid Profile No results for input(s): CHOL, HDL, LDLCALC, TRIG, CHOLHDL, LDLDIRECT in the last 72 hours. Thyroid function studies No results for input(s): TSH, T4TOTAL, T3FREE, THYROIDAB in the last 72 hours.  Invalid input(s): FREET3 Anemia work up No results for input(s): VITAMINB12, FOLATE, FERRITIN, TIBC, IRON, RETICCTPCT in the last 72 hours. Urinalysis    Component Value Date/Time   COLORURINE YELLOW 06/20/2020 1654   APPEARANCEUR CLEAR 06/20/2020 1654   LABSPEC 1.033 (H) 06/20/2020 1654   PHURINE 5.0 06/20/2020 1654   GLUCOSEU NEGATIVE 06/20/2020 1654   HGBUR NEGATIVE 06/20/2020 1654   BILIRUBINUR NEGATIVE 06/20/2020 1654   KETONESUR NEGATIVE 06/20/2020 1654   PROTEINUR NEGATIVE 06/20/2020 1654   NITRITE POSITIVE (A) 06/20/2020 1654   LEUKOCYTESUR MODERATE (A) 06/20/2020 1654    Microbiology Recent Results (from the past 240 hour(s))  SARS Coronavirus 2 by RT PCR (hospital order, performed in [facility] hospital lab) Nasopharyngeal Nasopharyngeal Swab     Status: None   Collection Time: 06/20/20 10:30 AM   Specimen: Nasopharyngeal Swab  Result Value Ref Range Status   SARS Coronavirus 2 NEGATIVE NEGATIVE Final    Comment: (NOTE) SARS-CoV-2 target nucleic acids are NOT DETECTED.  The SARS-CoV-2 RNA is generally detectable in upper and lower respiratory specimens during the acute phase of infection. The lowest concentration of SARS-CoV-2 viral copies this assay  can detect is 250 copies / mL. A negative result does not preclude SARS-CoV-2 infection and should not be used as the sole basis for treatment or other patient management decisions.  A negative result may occur with improper specimen collection / handling, submission of specimen other than nasopharyngeal swab, presence of viral mutation(s) within the areas targeted by this assay, and inadequate number of viral copies (<250 copies / mL). A negative result must be combined with clinical observations, patient history, and epidemiological information.  Fact Sheet for Patients:   BoilerBrush.com.cy  Fact Sheet for Healthcare Providers: https://pope.com/  This test is not yet approved or  cleared by the [place] FDA and has been authorized for detection and/or diagnosis of SARS-CoV-2 by FDA under an Emergency Use Authorization (EUA).  This EUA will remain in effect (meaning this test can be used) for the duration of the COVID-19 declaration under Section 564(b)(1) of the Act, 21 U.S.C. section 360bbb-3(b)(1), unless the authorization is terminated or revoked sooner.  Performed at [facility], [address], [place], [place] [address]     Time coordinating discharge: Approximately 40 minutes  Tyrone Nine, MD  [facility] 06/24/2020, 10:22 AM

## 2020-06-25 DIAGNOSIS — I2721 Secondary pulmonary arterial hypertension: Secondary | ICD-10-CM | POA: Diagnosis not present

## 2020-06-25 DIAGNOSIS — E1142 Type 2 diabetes mellitus with diabetic polyneuropathy: Secondary | ICD-10-CM | POA: Diagnosis not present

## 2020-06-25 DIAGNOSIS — Z9181 History of falling: Secondary | ICD-10-CM | POA: Diagnosis not present

## 2020-06-25 DIAGNOSIS — J90 Pleural effusion, not elsewhere classified: Secondary | ICD-10-CM | POA: Diagnosis not present

## 2020-06-25 DIAGNOSIS — I2510 Atherosclerotic heart disease of native coronary artery without angina pectoris: Secondary | ICD-10-CM | POA: Diagnosis not present

## 2020-06-25 DIAGNOSIS — Z791 Long term (current) use of non-steroidal anti-inflammatories (NSAID): Secondary | ICD-10-CM | POA: Diagnosis not present

## 2020-06-25 DIAGNOSIS — I313 Pericardial effusion (noninflammatory): Secondary | ICD-10-CM | POA: Diagnosis not present

## 2020-06-25 DIAGNOSIS — N1832 Chronic kidney disease, stage 3b: Secondary | ICD-10-CM | POA: Diagnosis not present

## 2020-06-25 DIAGNOSIS — Z85038 Personal history of other malignant neoplasm of large intestine: Secondary | ICD-10-CM | POA: Diagnosis not present

## 2020-06-25 DIAGNOSIS — Z7984 Long term (current) use of oral hypoglycemic drugs: Secondary | ICD-10-CM | POA: Diagnosis not present

## 2020-06-25 DIAGNOSIS — J9811 Atelectasis: Secondary | ICD-10-CM | POA: Diagnosis not present

## 2020-06-25 DIAGNOSIS — E785 Hyperlipidemia, unspecified: Secondary | ICD-10-CM | POA: Diagnosis not present

## 2020-06-25 DIAGNOSIS — I1310 Hypertensive heart and chronic kidney disease without heart failure, with stage 1 through stage 4 chronic kidney disease, or unspecified chronic kidney disease: Secondary | ICD-10-CM | POA: Diagnosis not present

## 2020-06-25 DIAGNOSIS — K219 Gastro-esophageal reflux disease without esophagitis: Secondary | ICD-10-CM | POA: Diagnosis not present

## 2020-06-25 DIAGNOSIS — K5900 Constipation, unspecified: Secondary | ICD-10-CM | POA: Diagnosis not present

## 2020-06-25 DIAGNOSIS — Z7951 Long term (current) use of inhaled steroids: Secondary | ICD-10-CM | POA: Diagnosis not present

## 2020-06-25 DIAGNOSIS — I088 Other rheumatic multiple valve diseases: Secondary | ICD-10-CM | POA: Diagnosis not present

## 2020-06-25 DIAGNOSIS — M810 Age-related osteoporosis without current pathological fracture: Secondary | ICD-10-CM | POA: Diagnosis not present

## 2020-06-25 DIAGNOSIS — J9601 Acute respiratory failure with hypoxia: Secondary | ICD-10-CM | POA: Diagnosis not present

## 2020-06-25 DIAGNOSIS — Z9981 Dependence on supplemental oxygen: Secondary | ICD-10-CM | POA: Diagnosis not present

## 2020-06-25 DIAGNOSIS — E1122 Type 2 diabetes mellitus with diabetic chronic kidney disease: Secondary | ICD-10-CM | POA: Diagnosis not present

## 2020-06-25 DIAGNOSIS — I700 Atherosclerosis of aorta: Secondary | ICD-10-CM | POA: Diagnosis not present

## 2020-06-25 DIAGNOSIS — Z87891 Personal history of nicotine dependence: Secondary | ICD-10-CM | POA: Diagnosis not present

## 2020-06-27 DIAGNOSIS — I1310 Hypertensive heart and chronic kidney disease without heart failure, with stage 1 through stage 4 chronic kidney disease, or unspecified chronic kidney disease: Secondary | ICD-10-CM | POA: Diagnosis not present

## 2020-06-27 DIAGNOSIS — I313 Pericardial effusion (noninflammatory): Secondary | ICD-10-CM | POA: Diagnosis not present

## 2020-06-27 DIAGNOSIS — E1142 Type 2 diabetes mellitus with diabetic polyneuropathy: Secondary | ICD-10-CM | POA: Diagnosis not present

## 2020-06-27 DIAGNOSIS — I2510 Atherosclerotic heart disease of native coronary artery without angina pectoris: Secondary | ICD-10-CM | POA: Diagnosis not present

## 2020-06-27 DIAGNOSIS — N1832 Chronic kidney disease, stage 3b: Secondary | ICD-10-CM | POA: Diagnosis not present

## 2020-06-27 DIAGNOSIS — E1122 Type 2 diabetes mellitus with diabetic chronic kidney disease: Secondary | ICD-10-CM | POA: Diagnosis not present

## 2020-06-30 DIAGNOSIS — J449 Chronic obstructive pulmonary disease, unspecified: Secondary | ICD-10-CM | POA: Diagnosis not present

## 2020-06-30 DIAGNOSIS — N1832 Chronic kidney disease, stage 3b: Secondary | ICD-10-CM | POA: Diagnosis not present

## 2020-06-30 DIAGNOSIS — I1310 Hypertensive heart and chronic kidney disease without heart failure, with stage 1 through stage 4 chronic kidney disease, or unspecified chronic kidney disease: Secondary | ICD-10-CM | POA: Diagnosis not present

## 2020-06-30 DIAGNOSIS — E7800 Pure hypercholesterolemia, unspecified: Secondary | ICD-10-CM | POA: Diagnosis not present

## 2020-06-30 DIAGNOSIS — E1122 Type 2 diabetes mellitus with diabetic chronic kidney disease: Secondary | ICD-10-CM | POA: Diagnosis not present

## 2020-06-30 DIAGNOSIS — E1142 Type 2 diabetes mellitus with diabetic polyneuropathy: Secondary | ICD-10-CM | POA: Diagnosis not present

## 2020-06-30 DIAGNOSIS — I2510 Atherosclerotic heart disease of native coronary artery without angina pectoris: Secondary | ICD-10-CM | POA: Diagnosis not present

## 2020-06-30 DIAGNOSIS — I10 Essential (primary) hypertension: Secondary | ICD-10-CM | POA: Diagnosis not present

## 2020-06-30 DIAGNOSIS — E119 Type 2 diabetes mellitus without complications: Secondary | ICD-10-CM | POA: Diagnosis not present

## 2020-06-30 DIAGNOSIS — I313 Pericardial effusion (noninflammatory): Secondary | ICD-10-CM | POA: Diagnosis not present

## 2020-07-01 ENCOUNTER — Ambulatory Visit (HOSPITAL_COMMUNITY)
Admission: RE | Admit: 2020-07-01 | Discharge: 2020-07-01 | Disposition: A | Discharge status: Home / Self Care | Payer: MEDICARE | Source: Ambulatory Visit | Attending: Cardiology | Admitting: Cardiology

## 2020-07-01 ENCOUNTER — Other Ambulatory Visit: Payer: Self-pay

## 2020-07-01 DIAGNOSIS — E1122 Type 2 diabetes mellitus with diabetic chronic kidney disease: Secondary | ICD-10-CM | POA: Diagnosis not present

## 2020-07-01 DIAGNOSIS — I1310 Hypertensive heart and chronic kidney disease without heart failure, with stage 1 through stage 4 chronic kidney disease, or unspecified chronic kidney disease: Secondary | ICD-10-CM | POA: Diagnosis not present

## 2020-07-01 DIAGNOSIS — E1142 Type 2 diabetes mellitus with diabetic polyneuropathy: Secondary | ICD-10-CM | POA: Diagnosis not present

## 2020-07-01 DIAGNOSIS — I2510 Atherosclerotic heart disease of native coronary artery without angina pectoris: Secondary | ICD-10-CM | POA: Diagnosis not present

## 2020-07-01 DIAGNOSIS — I3139 Other pericardial effusion (noninflammatory): Secondary | ICD-10-CM

## 2020-07-01 DIAGNOSIS — N1832 Chronic kidney disease, stage 3b: Secondary | ICD-10-CM | POA: Diagnosis not present

## 2020-07-01 DIAGNOSIS — I313 Pericardial effusion (noninflammatory): Secondary | ICD-10-CM | POA: Diagnosis not present

## 2020-07-01 LAB — ECHOCARDIOGRAM LIMITED
Area-P 1/2: 4.49 cm2
S' Lateral: 1.86 cm

## 2020-07-01 NOTE — Progress Notes (Signed)
*  PRELIMINARY RESULTS* Echocardiogram 2D Echocardiogram LIMITED has been performed.  Leavy Cella 07/01/2020, 2:58 PM

## 2020-07-04 DIAGNOSIS — E1122 Type 2 diabetes mellitus with diabetic chronic kidney disease: Secondary | ICD-10-CM | POA: Diagnosis not present

## 2020-07-04 DIAGNOSIS — I1310 Hypertensive heart and chronic kidney disease without heart failure, with stage 1 through stage 4 chronic kidney disease, or unspecified chronic kidney disease: Secondary | ICD-10-CM | POA: Diagnosis not present

## 2020-07-04 DIAGNOSIS — N1832 Chronic kidney disease, stage 3b: Secondary | ICD-10-CM | POA: Diagnosis not present

## 2020-07-04 DIAGNOSIS — I2510 Atherosclerotic heart disease of native coronary artery without angina pectoris: Secondary | ICD-10-CM | POA: Diagnosis not present

## 2020-07-04 DIAGNOSIS — I313 Pericardial effusion (noninflammatory): Secondary | ICD-10-CM | POA: Diagnosis not present

## 2020-07-04 DIAGNOSIS — E1142 Type 2 diabetes mellitus with diabetic polyneuropathy: Secondary | ICD-10-CM | POA: Diagnosis not present

## 2020-07-05 DIAGNOSIS — N1832 Chronic kidney disease, stage 3b: Secondary | ICD-10-CM | POA: Diagnosis not present

## 2020-07-05 DIAGNOSIS — E1142 Type 2 diabetes mellitus with diabetic polyneuropathy: Secondary | ICD-10-CM | POA: Diagnosis not present

## 2020-07-05 DIAGNOSIS — I313 Pericardial effusion (noninflammatory): Secondary | ICD-10-CM | POA: Diagnosis not present

## 2020-07-05 DIAGNOSIS — I1310 Hypertensive heart and chronic kidney disease without heart failure, with stage 1 through stage 4 chronic kidney disease, or unspecified chronic kidney disease: Secondary | ICD-10-CM | POA: Diagnosis not present

## 2020-07-05 DIAGNOSIS — E1122 Type 2 diabetes mellitus with diabetic chronic kidney disease: Secondary | ICD-10-CM | POA: Diagnosis not present

## 2020-07-05 DIAGNOSIS — I2510 Atherosclerotic heart disease of native coronary artery without angina pectoris: Secondary | ICD-10-CM | POA: Diagnosis not present

## 2020-07-06 DIAGNOSIS — I1310 Hypertensive heart and chronic kidney disease without heart failure, with stage 1 through stage 4 chronic kidney disease, or unspecified chronic kidney disease: Secondary | ICD-10-CM | POA: Diagnosis not present

## 2020-07-06 DIAGNOSIS — E1122 Type 2 diabetes mellitus with diabetic chronic kidney disease: Secondary | ICD-10-CM | POA: Diagnosis not present

## 2020-07-06 DIAGNOSIS — N1832 Chronic kidney disease, stage 3b: Secondary | ICD-10-CM | POA: Diagnosis not present

## 2020-07-06 DIAGNOSIS — I2510 Atherosclerotic heart disease of native coronary artery without angina pectoris: Secondary | ICD-10-CM | POA: Diagnosis not present

## 2020-07-06 DIAGNOSIS — I313 Pericardial effusion (noninflammatory): Secondary | ICD-10-CM | POA: Diagnosis not present

## 2020-07-06 DIAGNOSIS — E1142 Type 2 diabetes mellitus with diabetic polyneuropathy: Secondary | ICD-10-CM | POA: Diagnosis not present

## 2020-07-07 DIAGNOSIS — I1310 Hypertensive heart and chronic kidney disease without heart failure, with stage 1 through stage 4 chronic kidney disease, or unspecified chronic kidney disease: Secondary | ICD-10-CM | POA: Diagnosis not present

## 2020-07-07 DIAGNOSIS — E1122 Type 2 diabetes mellitus with diabetic chronic kidney disease: Secondary | ICD-10-CM | POA: Diagnosis not present

## 2020-07-07 DIAGNOSIS — I313 Pericardial effusion (noninflammatory): Secondary | ICD-10-CM | POA: Diagnosis not present

## 2020-07-07 DIAGNOSIS — E1142 Type 2 diabetes mellitus with diabetic polyneuropathy: Secondary | ICD-10-CM | POA: Diagnosis not present

## 2020-07-07 DIAGNOSIS — N1832 Chronic kidney disease, stage 3b: Secondary | ICD-10-CM | POA: Diagnosis not present

## 2020-07-07 DIAGNOSIS — I2510 Atherosclerotic heart disease of native coronary artery without angina pectoris: Secondary | ICD-10-CM | POA: Diagnosis not present

## 2020-07-11 DIAGNOSIS — E1142 Type 2 diabetes mellitus with diabetic polyneuropathy: Secondary | ICD-10-CM | POA: Diagnosis not present

## 2020-07-11 DIAGNOSIS — I313 Pericardial effusion (noninflammatory): Secondary | ICD-10-CM | POA: Diagnosis not present

## 2020-07-11 DIAGNOSIS — E1122 Type 2 diabetes mellitus with diabetic chronic kidney disease: Secondary | ICD-10-CM | POA: Diagnosis not present

## 2020-07-11 DIAGNOSIS — I1310 Hypertensive heart and chronic kidney disease without heart failure, with stage 1 through stage 4 chronic kidney disease, or unspecified chronic kidney disease: Secondary | ICD-10-CM | POA: Diagnosis not present

## 2020-07-11 DIAGNOSIS — N1832 Chronic kidney disease, stage 3b: Secondary | ICD-10-CM | POA: Diagnosis not present

## 2020-07-11 DIAGNOSIS — R197 Diarrhea, unspecified: Secondary | ICD-10-CM | POA: Diagnosis not present

## 2020-07-11 DIAGNOSIS — I2510 Atherosclerotic heart disease of native coronary artery without angina pectoris: Secondary | ICD-10-CM | POA: Diagnosis not present

## 2020-07-12 DIAGNOSIS — I2510 Atherosclerotic heart disease of native coronary artery without angina pectoris: Secondary | ICD-10-CM | POA: Diagnosis not present

## 2020-07-12 DIAGNOSIS — E1142 Type 2 diabetes mellitus with diabetic polyneuropathy: Secondary | ICD-10-CM | POA: Diagnosis not present

## 2020-07-12 DIAGNOSIS — I1310 Hypertensive heart and chronic kidney disease without heart failure, with stage 1 through stage 4 chronic kidney disease, or unspecified chronic kidney disease: Secondary | ICD-10-CM | POA: Diagnosis not present

## 2020-07-12 DIAGNOSIS — N1832 Chronic kidney disease, stage 3b: Secondary | ICD-10-CM | POA: Diagnosis not present

## 2020-07-12 DIAGNOSIS — I313 Pericardial effusion (noninflammatory): Secondary | ICD-10-CM | POA: Diagnosis not present

## 2020-07-12 DIAGNOSIS — E1122 Type 2 diabetes mellitus with diabetic chronic kidney disease: Secondary | ICD-10-CM | POA: Diagnosis not present

## 2020-07-14 DIAGNOSIS — I1310 Hypertensive heart and chronic kidney disease without heart failure, with stage 1 through stage 4 chronic kidney disease, or unspecified chronic kidney disease: Secondary | ICD-10-CM | POA: Diagnosis not present

## 2020-07-14 DIAGNOSIS — I313 Pericardial effusion (noninflammatory): Secondary | ICD-10-CM | POA: Diagnosis not present

## 2020-07-14 DIAGNOSIS — E1142 Type 2 diabetes mellitus with diabetic polyneuropathy: Secondary | ICD-10-CM | POA: Diagnosis not present

## 2020-07-14 DIAGNOSIS — E1122 Type 2 diabetes mellitus with diabetic chronic kidney disease: Secondary | ICD-10-CM | POA: Diagnosis not present

## 2020-07-14 DIAGNOSIS — N1832 Chronic kidney disease, stage 3b: Secondary | ICD-10-CM | POA: Diagnosis not present

## 2020-07-14 DIAGNOSIS — I2510 Atherosclerotic heart disease of native coronary artery without angina pectoris: Secondary | ICD-10-CM | POA: Diagnosis not present

## 2020-07-15 DIAGNOSIS — E1122 Type 2 diabetes mellitus with diabetic chronic kidney disease: Secondary | ICD-10-CM | POA: Diagnosis not present

## 2020-07-15 DIAGNOSIS — I2510 Atherosclerotic heart disease of native coronary artery without angina pectoris: Secondary | ICD-10-CM | POA: Diagnosis not present

## 2020-07-15 DIAGNOSIS — I313 Pericardial effusion (noninflammatory): Secondary | ICD-10-CM | POA: Diagnosis not present

## 2020-07-15 DIAGNOSIS — I1310 Hypertensive heart and chronic kidney disease without heart failure, with stage 1 through stage 4 chronic kidney disease, or unspecified chronic kidney disease: Secondary | ICD-10-CM | POA: Diagnosis not present

## 2020-07-15 DIAGNOSIS — N1832 Chronic kidney disease, stage 3b: Secondary | ICD-10-CM | POA: Diagnosis not present

## 2020-07-15 DIAGNOSIS — E1142 Type 2 diabetes mellitus with diabetic polyneuropathy: Secondary | ICD-10-CM | POA: Diagnosis not present

## 2020-07-18 DIAGNOSIS — I313 Pericardial effusion (noninflammatory): Secondary | ICD-10-CM | POA: Diagnosis not present

## 2020-07-18 DIAGNOSIS — E1142 Type 2 diabetes mellitus with diabetic polyneuropathy: Secondary | ICD-10-CM | POA: Diagnosis not present

## 2020-07-18 DIAGNOSIS — I1310 Hypertensive heart and chronic kidney disease without heart failure, with stage 1 through stage 4 chronic kidney disease, or unspecified chronic kidney disease: Secondary | ICD-10-CM | POA: Diagnosis not present

## 2020-07-18 DIAGNOSIS — I2510 Atherosclerotic heart disease of native coronary artery without angina pectoris: Secondary | ICD-10-CM | POA: Diagnosis not present

## 2020-07-18 DIAGNOSIS — N1832 Chronic kidney disease, stage 3b: Secondary | ICD-10-CM | POA: Diagnosis not present

## 2020-07-18 DIAGNOSIS — E1122 Type 2 diabetes mellitus with diabetic chronic kidney disease: Secondary | ICD-10-CM | POA: Diagnosis not present

## 2020-07-20 ENCOUNTER — Telehealth: Payer: MEDICARE | Admitting: Cardiology

## 2020-07-20 DIAGNOSIS — I313 Pericardial effusion (noninflammatory): Secondary | ICD-10-CM | POA: Diagnosis not present

## 2020-07-20 DIAGNOSIS — E1122 Type 2 diabetes mellitus with diabetic chronic kidney disease: Secondary | ICD-10-CM | POA: Diagnosis not present

## 2020-07-20 DIAGNOSIS — E1142 Type 2 diabetes mellitus with diabetic polyneuropathy: Secondary | ICD-10-CM | POA: Diagnosis not present

## 2020-07-20 DIAGNOSIS — I2510 Atherosclerotic heart disease of native coronary artery without angina pectoris: Secondary | ICD-10-CM | POA: Diagnosis not present

## 2020-07-20 DIAGNOSIS — I1310 Hypertensive heart and chronic kidney disease without heart failure, with stage 1 through stage 4 chronic kidney disease, or unspecified chronic kidney disease: Secondary | ICD-10-CM | POA: Diagnosis not present

## 2020-07-20 DIAGNOSIS — N1832 Chronic kidney disease, stage 3b: Secondary | ICD-10-CM | POA: Diagnosis not present

## 2020-07-22 DIAGNOSIS — E1122 Type 2 diabetes mellitus with diabetic chronic kidney disease: Secondary | ICD-10-CM | POA: Diagnosis not present

## 2020-07-22 DIAGNOSIS — I2510 Atherosclerotic heart disease of native coronary artery without angina pectoris: Secondary | ICD-10-CM | POA: Diagnosis not present

## 2020-07-22 DIAGNOSIS — E1142 Type 2 diabetes mellitus with diabetic polyneuropathy: Secondary | ICD-10-CM | POA: Diagnosis not present

## 2020-07-22 DIAGNOSIS — N1832 Chronic kidney disease, stage 3b: Secondary | ICD-10-CM | POA: Diagnosis not present

## 2020-07-22 DIAGNOSIS — I1310 Hypertensive heart and chronic kidney disease without heart failure, with stage 1 through stage 4 chronic kidney disease, or unspecified chronic kidney disease: Secondary | ICD-10-CM | POA: Diagnosis not present

## 2020-07-22 DIAGNOSIS — I313 Pericardial effusion (noninflammatory): Secondary | ICD-10-CM | POA: Diagnosis not present

## 2020-07-25 DIAGNOSIS — Z9981 Dependence on supplemental oxygen: Secondary | ICD-10-CM | POA: Diagnosis not present

## 2020-07-25 DIAGNOSIS — Z9181 History of falling: Secondary | ICD-10-CM | POA: Diagnosis not present

## 2020-07-25 DIAGNOSIS — M810 Age-related osteoporosis without current pathological fracture: Secondary | ICD-10-CM | POA: Diagnosis not present

## 2020-07-25 DIAGNOSIS — E1122 Type 2 diabetes mellitus with diabetic chronic kidney disease: Secondary | ICD-10-CM | POA: Diagnosis not present

## 2020-07-25 DIAGNOSIS — Z791 Long term (current) use of non-steroidal anti-inflammatories (NSAID): Secondary | ICD-10-CM | POA: Diagnosis not present

## 2020-07-25 DIAGNOSIS — K219 Gastro-esophageal reflux disease without esophagitis: Secondary | ICD-10-CM | POA: Diagnosis not present

## 2020-07-25 DIAGNOSIS — K5900 Constipation, unspecified: Secondary | ICD-10-CM | POA: Diagnosis not present

## 2020-07-25 DIAGNOSIS — J90 Pleural effusion, not elsewhere classified: Secondary | ICD-10-CM | POA: Diagnosis not present

## 2020-07-25 DIAGNOSIS — J9811 Atelectasis: Secondary | ICD-10-CM | POA: Diagnosis not present

## 2020-07-25 DIAGNOSIS — J9601 Acute respiratory failure with hypoxia: Secondary | ICD-10-CM | POA: Diagnosis not present

## 2020-07-25 DIAGNOSIS — I1310 Hypertensive heart and chronic kidney disease without heart failure, with stage 1 through stage 4 chronic kidney disease, or unspecified chronic kidney disease: Secondary | ICD-10-CM | POA: Diagnosis not present

## 2020-07-25 DIAGNOSIS — Z7951 Long term (current) use of inhaled steroids: Secondary | ICD-10-CM | POA: Diagnosis not present

## 2020-07-25 DIAGNOSIS — I2510 Atherosclerotic heart disease of native coronary artery without angina pectoris: Secondary | ICD-10-CM | POA: Diagnosis not present

## 2020-07-25 DIAGNOSIS — E785 Hyperlipidemia, unspecified: Secondary | ICD-10-CM | POA: Diagnosis not present

## 2020-07-25 DIAGNOSIS — I2721 Secondary pulmonary arterial hypertension: Secondary | ICD-10-CM | POA: Diagnosis not present

## 2020-07-25 DIAGNOSIS — Z85038 Personal history of other malignant neoplasm of large intestine: Secondary | ICD-10-CM | POA: Diagnosis not present

## 2020-07-25 DIAGNOSIS — I313 Pericardial effusion (noninflammatory): Secondary | ICD-10-CM | POA: Diagnosis not present

## 2020-07-25 DIAGNOSIS — N1832 Chronic kidney disease, stage 3b: Secondary | ICD-10-CM | POA: Diagnosis not present

## 2020-07-25 DIAGNOSIS — I700 Atherosclerosis of aorta: Secondary | ICD-10-CM | POA: Diagnosis not present

## 2020-07-25 DIAGNOSIS — I088 Other rheumatic multiple valve diseases: Secondary | ICD-10-CM | POA: Diagnosis not present

## 2020-07-25 DIAGNOSIS — E1142 Type 2 diabetes mellitus with diabetic polyneuropathy: Secondary | ICD-10-CM | POA: Diagnosis not present

## 2020-07-25 DIAGNOSIS — Z87891 Personal history of nicotine dependence: Secondary | ICD-10-CM | POA: Diagnosis not present

## 2020-07-25 DIAGNOSIS — Z7984 Long term (current) use of oral hypoglycemic drugs: Secondary | ICD-10-CM | POA: Diagnosis not present

## 2020-07-27 DIAGNOSIS — I313 Pericardial effusion (noninflammatory): Secondary | ICD-10-CM | POA: Diagnosis not present

## 2020-07-27 DIAGNOSIS — E1122 Type 2 diabetes mellitus with diabetic chronic kidney disease: Secondary | ICD-10-CM | POA: Diagnosis not present

## 2020-07-27 DIAGNOSIS — I1310 Hypertensive heart and chronic kidney disease without heart failure, with stage 1 through stage 4 chronic kidney disease, or unspecified chronic kidney disease: Secondary | ICD-10-CM | POA: Diagnosis not present

## 2020-07-27 DIAGNOSIS — N1832 Chronic kidney disease, stage 3b: Secondary | ICD-10-CM | POA: Diagnosis not present

## 2020-07-27 DIAGNOSIS — E1142 Type 2 diabetes mellitus with diabetic polyneuropathy: Secondary | ICD-10-CM | POA: Diagnosis not present

## 2020-07-27 DIAGNOSIS — I2510 Atherosclerotic heart disease of native coronary artery without angina pectoris: Secondary | ICD-10-CM | POA: Diagnosis not present

## 2020-07-29 DIAGNOSIS — E1122 Type 2 diabetes mellitus with diabetic chronic kidney disease: Secondary | ICD-10-CM | POA: Diagnosis not present

## 2020-07-29 DIAGNOSIS — E1142 Type 2 diabetes mellitus with diabetic polyneuropathy: Secondary | ICD-10-CM | POA: Diagnosis not present

## 2020-07-29 DIAGNOSIS — I313 Pericardial effusion (noninflammatory): Secondary | ICD-10-CM | POA: Diagnosis not present

## 2020-07-29 DIAGNOSIS — N1832 Chronic kidney disease, stage 3b: Secondary | ICD-10-CM | POA: Diagnosis not present

## 2020-07-29 DIAGNOSIS — I1310 Hypertensive heart and chronic kidney disease without heart failure, with stage 1 through stage 4 chronic kidney disease, or unspecified chronic kidney disease: Secondary | ICD-10-CM | POA: Diagnosis not present

## 2020-07-29 DIAGNOSIS — I2510 Atherosclerotic heart disease of native coronary artery without angina pectoris: Secondary | ICD-10-CM | POA: Diagnosis not present

## 2020-08-02 DIAGNOSIS — N1832 Chronic kidney disease, stage 3b: Secondary | ICD-10-CM | POA: Diagnosis not present

## 2020-08-02 DIAGNOSIS — E1142 Type 2 diabetes mellitus with diabetic polyneuropathy: Secondary | ICD-10-CM | POA: Diagnosis not present

## 2020-08-02 DIAGNOSIS — I2510 Atherosclerotic heart disease of native coronary artery without angina pectoris: Secondary | ICD-10-CM | POA: Diagnosis not present

## 2020-08-02 DIAGNOSIS — E1122 Type 2 diabetes mellitus with diabetic chronic kidney disease: Secondary | ICD-10-CM | POA: Diagnosis not present

## 2020-08-02 DIAGNOSIS — I1310 Hypertensive heart and chronic kidney disease without heart failure, with stage 1 through stage 4 chronic kidney disease, or unspecified chronic kidney disease: Secondary | ICD-10-CM | POA: Diagnosis not present

## 2020-08-02 DIAGNOSIS — I313 Pericardial effusion (noninflammatory): Secondary | ICD-10-CM | POA: Diagnosis not present

## 2020-08-03 DIAGNOSIS — I313 Pericardial effusion (noninflammatory): Secondary | ICD-10-CM | POA: Diagnosis not present

## 2020-08-03 DIAGNOSIS — I2510 Atherosclerotic heart disease of native coronary artery without angina pectoris: Secondary | ICD-10-CM | POA: Diagnosis not present

## 2020-08-03 DIAGNOSIS — N1832 Chronic kidney disease, stage 3b: Secondary | ICD-10-CM | POA: Diagnosis not present

## 2020-08-03 DIAGNOSIS — E1122 Type 2 diabetes mellitus with diabetic chronic kidney disease: Secondary | ICD-10-CM | POA: Diagnosis not present

## 2020-08-03 DIAGNOSIS — E1142 Type 2 diabetes mellitus with diabetic polyneuropathy: Secondary | ICD-10-CM | POA: Diagnosis not present

## 2020-08-03 DIAGNOSIS — I1310 Hypertensive heart and chronic kidney disease without heart failure, with stage 1 through stage 4 chronic kidney disease, or unspecified chronic kidney disease: Secondary | ICD-10-CM | POA: Diagnosis not present

## 2020-08-09 ENCOUNTER — Ambulatory Visit: Payer: MEDICARE | Admitting: Podiatry

## 2020-08-09 DIAGNOSIS — E1122 Type 2 diabetes mellitus with diabetic chronic kidney disease: Secondary | ICD-10-CM | POA: Diagnosis not present

## 2020-08-09 DIAGNOSIS — I313 Pericardial effusion (noninflammatory): Secondary | ICD-10-CM | POA: Diagnosis not present

## 2020-08-09 DIAGNOSIS — I1310 Hypertensive heart and chronic kidney disease without heart failure, with stage 1 through stage 4 chronic kidney disease, or unspecified chronic kidney disease: Secondary | ICD-10-CM | POA: Diagnosis not present

## 2020-08-09 DIAGNOSIS — I2510 Atherosclerotic heart disease of native coronary artery without angina pectoris: Secondary | ICD-10-CM | POA: Diagnosis not present

## 2020-08-09 DIAGNOSIS — N1832 Chronic kidney disease, stage 3b: Secondary | ICD-10-CM | POA: Diagnosis not present

## 2020-08-09 DIAGNOSIS — E1142 Type 2 diabetes mellitus with diabetic polyneuropathy: Secondary | ICD-10-CM | POA: Diagnosis not present

## 2020-08-10 DIAGNOSIS — I2510 Atherosclerotic heart disease of native coronary artery without angina pectoris: Secondary | ICD-10-CM | POA: Diagnosis not present

## 2020-08-10 DIAGNOSIS — I313 Pericardial effusion (noninflammatory): Secondary | ICD-10-CM | POA: Diagnosis not present

## 2020-08-10 DIAGNOSIS — N1832 Chronic kidney disease, stage 3b: Secondary | ICD-10-CM | POA: Diagnosis not present

## 2020-08-10 DIAGNOSIS — E1142 Type 2 diabetes mellitus with diabetic polyneuropathy: Secondary | ICD-10-CM | POA: Diagnosis not present

## 2020-08-10 DIAGNOSIS — E1122 Type 2 diabetes mellitus with diabetic chronic kidney disease: Secondary | ICD-10-CM | POA: Diagnosis not present

## 2020-08-10 DIAGNOSIS — I1310 Hypertensive heart and chronic kidney disease without heart failure, with stage 1 through stage 4 chronic kidney disease, or unspecified chronic kidney disease: Secondary | ICD-10-CM | POA: Diagnosis not present

## 2020-08-11 DIAGNOSIS — E1122 Type 2 diabetes mellitus with diabetic chronic kidney disease: Secondary | ICD-10-CM | POA: Diagnosis not present

## 2020-08-11 DIAGNOSIS — E1142 Type 2 diabetes mellitus with diabetic polyneuropathy: Secondary | ICD-10-CM | POA: Diagnosis not present

## 2020-08-11 DIAGNOSIS — I313 Pericardial effusion (noninflammatory): Secondary | ICD-10-CM | POA: Diagnosis not present

## 2020-08-11 DIAGNOSIS — I1310 Hypertensive heart and chronic kidney disease without heart failure, with stage 1 through stage 4 chronic kidney disease, or unspecified chronic kidney disease: Secondary | ICD-10-CM | POA: Diagnosis not present

## 2020-08-11 DIAGNOSIS — I2510 Atherosclerotic heart disease of native coronary artery without angina pectoris: Secondary | ICD-10-CM | POA: Diagnosis not present

## 2020-08-11 DIAGNOSIS — N1832 Chronic kidney disease, stage 3b: Secondary | ICD-10-CM | POA: Diagnosis not present

## 2020-08-17 DIAGNOSIS — I1310 Hypertensive heart and chronic kidney disease without heart failure, with stage 1 through stage 4 chronic kidney disease, or unspecified chronic kidney disease: Secondary | ICD-10-CM | POA: Diagnosis not present

## 2020-08-17 DIAGNOSIS — N1832 Chronic kidney disease, stage 3b: Secondary | ICD-10-CM | POA: Diagnosis not present

## 2020-08-17 DIAGNOSIS — I2510 Atherosclerotic heart disease of native coronary artery without angina pectoris: Secondary | ICD-10-CM | POA: Diagnosis not present

## 2020-08-17 DIAGNOSIS — I313 Pericardial effusion (noninflammatory): Secondary | ICD-10-CM | POA: Diagnosis not present

## 2020-08-17 DIAGNOSIS — E1142 Type 2 diabetes mellitus with diabetic polyneuropathy: Secondary | ICD-10-CM | POA: Diagnosis not present

## 2020-08-17 DIAGNOSIS — E1122 Type 2 diabetes mellitus with diabetic chronic kidney disease: Secondary | ICD-10-CM | POA: Diagnosis not present

## 2020-08-18 ENCOUNTER — Inpatient Hospital Stay (HOSPITAL_COMMUNITY)
Admission: EM | Admit: 2020-08-18 | Discharge: 2020-08-22 | DRG: 178 | Disposition: A | Discharge status: Home Health | Payer: MEDICARE | Attending: Internal Medicine | Admitting: Internal Medicine

## 2020-08-18 ENCOUNTER — Encounter (HOSPITAL_COMMUNITY): Payer: Self-pay | Admitting: Emergency Medicine

## 2020-08-18 ENCOUNTER — Emergency Department (HOSPITAL_COMMUNITY): Payer: MEDICARE

## 2020-08-18 ENCOUNTER — Other Ambulatory Visit: Payer: Self-pay

## 2020-08-18 DIAGNOSIS — R0602 Shortness of breath: Secondary | ICD-10-CM | POA: Diagnosis not present

## 2020-08-18 DIAGNOSIS — J90 Pleural effusion, not elsewhere classified: Secondary | ICD-10-CM | POA: Diagnosis not present

## 2020-08-18 DIAGNOSIS — R0902 Hypoxemia: Secondary | ICD-10-CM | POA: Diagnosis not present

## 2020-08-18 DIAGNOSIS — E119 Type 2 diabetes mellitus without complications: Secondary | ICD-10-CM

## 2020-08-18 DIAGNOSIS — R069 Unspecified abnormalities of breathing: Secondary | ICD-10-CM | POA: Diagnosis not present

## 2020-08-18 DIAGNOSIS — I2510 Atherosclerotic heart disease of native coronary artery without angina pectoris: Secondary | ICD-10-CM | POA: Diagnosis not present

## 2020-08-18 DIAGNOSIS — N1832 Chronic kidney disease, stage 3b: Secondary | ICD-10-CM | POA: Diagnosis not present

## 2020-08-18 DIAGNOSIS — J189 Pneumonia, unspecified organism: Principal | ICD-10-CM

## 2020-08-18 DIAGNOSIS — R1310 Dysphagia, unspecified: Secondary | ICD-10-CM | POA: Diagnosis present

## 2020-08-18 DIAGNOSIS — E876 Hypokalemia: Secondary | ICD-10-CM | POA: Diagnosis present

## 2020-08-18 DIAGNOSIS — J690 Pneumonitis due to inhalation of food and vomit: Secondary | ICD-10-CM | POA: Diagnosis present

## 2020-08-18 DIAGNOSIS — F0390 Unspecified dementia without behavioral disturbance: Secondary | ICD-10-CM | POA: Diagnosis present

## 2020-08-18 DIAGNOSIS — I313 Pericardial effusion (noninflammatory): Secondary | ICD-10-CM | POA: Diagnosis not present

## 2020-08-18 DIAGNOSIS — E1142 Type 2 diabetes mellitus with diabetic polyneuropathy: Secondary | ICD-10-CM | POA: Diagnosis present

## 2020-08-18 DIAGNOSIS — Z7984 Long term (current) use of oral hypoglycemic drugs: Secondary | ICD-10-CM

## 2020-08-18 DIAGNOSIS — Z20822 Contact with and (suspected) exposure to covid-19: Secondary | ICD-10-CM | POA: Diagnosis present

## 2020-08-18 DIAGNOSIS — Z79899 Other long term (current) drug therapy: Secondary | ICD-10-CM | POA: Diagnosis not present

## 2020-08-18 DIAGNOSIS — Y95 Nosocomial condition: Secondary | ICD-10-CM | POA: Diagnosis present

## 2020-08-18 DIAGNOSIS — K219 Gastro-esophageal reflux disease without esophagitis: Secondary | ICD-10-CM | POA: Diagnosis present

## 2020-08-18 DIAGNOSIS — E1122 Type 2 diabetes mellitus with diabetic chronic kidney disease: Secondary | ICD-10-CM | POA: Diagnosis not present

## 2020-08-18 DIAGNOSIS — E1140 Type 2 diabetes mellitus with diabetic neuropathy, unspecified: Secondary | ICD-10-CM | POA: Diagnosis not present

## 2020-08-18 DIAGNOSIS — Z9981 Dependence on supplemental oxygen: Secondary | ICD-10-CM | POA: Diagnosis not present

## 2020-08-18 DIAGNOSIS — Z9221 Personal history of antineoplastic chemotherapy: Secondary | ICD-10-CM | POA: Diagnosis not present

## 2020-08-18 DIAGNOSIS — E785 Hyperlipidemia, unspecified: Secondary | ICD-10-CM | POA: Diagnosis present

## 2020-08-18 DIAGNOSIS — Z9049 Acquired absence of other specified parts of digestive tract: Secondary | ICD-10-CM | POA: Diagnosis not present

## 2020-08-18 DIAGNOSIS — Z85038 Personal history of other malignant neoplasm of large intestine: Secondary | ICD-10-CM

## 2020-08-18 DIAGNOSIS — I10 Essential (primary) hypertension: Secondary | ICD-10-CM | POA: Diagnosis present

## 2020-08-18 DIAGNOSIS — R5381 Other malaise: Secondary | ICD-10-CM | POA: Diagnosis not present

## 2020-08-18 DIAGNOSIS — I1310 Hypertensive heart and chronic kidney disease without heart failure, with stage 1 through stage 4 chronic kidney disease, or unspecified chronic kidney disease: Secondary | ICD-10-CM | POA: Diagnosis not present

## 2020-08-18 DIAGNOSIS — J9611 Chronic respiratory failure with hypoxia: Secondary | ICD-10-CM | POA: Diagnosis present

## 2020-08-18 DIAGNOSIS — I517 Cardiomegaly: Secondary | ICD-10-CM | POA: Diagnosis not present

## 2020-08-18 LAB — CBC WITH DIFFERENTIAL/PLATELET
Abs Immature Granulocytes: 0.04 10*3/uL (ref 0.00–0.07)
Basophils Absolute: 0.1 10*3/uL (ref 0.0–0.1)
Basophils Relative: 1 %
Eosinophils Absolute: 0.2 10*3/uL (ref 0.0–0.5)
Eosinophils Relative: 2 %
HCT: 30.3 % — ABNORMAL LOW (ref 36.0–46.0)
Hemoglobin: 9.2 g/dL — ABNORMAL LOW (ref 12.0–15.0)
Immature Granulocytes: 1 %
Lymphocytes Relative: 11 %
Lymphs Abs: 1 10*3/uL (ref 0.7–4.0)
MCH: 26.4 pg (ref 26.0–34.0)
MCHC: 30.4 g/dL (ref 30.0–36.0)
MCV: 87.1 fL (ref 80.0–100.0)
Monocytes Absolute: 0.8 10*3/uL (ref 0.1–1.0)
Monocytes Relative: 9 %
Neutro Abs: 6.8 10*3/uL (ref 1.7–7.7)
Neutrophils Relative %: 76 %
Platelets: 289 10*3/uL (ref 150–400)
RBC: 3.48 MIL/uL — ABNORMAL LOW (ref 3.87–5.11)
RDW: 17.5 % — ABNORMAL HIGH (ref 11.5–15.5)
WBC: 8.9 10*3/uL (ref 4.0–10.5)
nRBC: 0 % (ref 0.0–0.2)

## 2020-08-18 LAB — RESP PANEL BY RT-PCR (FLU A&B, COVID) ARPGX2
Influenza A by PCR: NEGATIVE
Influenza B by PCR: NEGATIVE
SARS Coronavirus 2 by RT PCR: NEGATIVE

## 2020-08-18 LAB — COMPREHENSIVE METABOLIC PANEL
ALT: 12 U/L (ref 0–44)
AST: 13 U/L — ABNORMAL LOW (ref 15–41)
Albumin: 3.2 g/dL — ABNORMAL LOW (ref 3.5–5.0)
Alkaline Phosphatase: 82 U/L (ref 38–126)
Anion gap: 10 (ref 5–15)
BUN: 15 mg/dL (ref 8–23)
CO2: 29 mmol/L (ref 22–32)
Calcium: 10.3 mg/dL (ref 8.9–10.3)
Chloride: 96 mmol/L — ABNORMAL LOW (ref 98–111)
Creatinine, Ser: 0.69 mg/dL (ref 0.44–1.00)
GFR, Estimated: >60 mL/min (ref >60)
Glucose, Bld: 217 mg/dL — ABNORMAL HIGH (ref 70–99)
Potassium: 2.5 mmol/L — CL (ref 3.5–5.1)
Sodium: 135 mmol/L (ref 135–145)
Total Bilirubin: 0.7 mg/dL (ref 0.3–1.2)
Total Protein: 7.2 g/dL (ref 6.5–8.1)

## 2020-08-18 LAB — MAGNESIUM: Magnesium: 1.5 mg/dL — ABNORMAL LOW (ref 1.7–2.4)

## 2020-08-18 LAB — GLUCOSE, CAPILLARY: Glucose-Capillary: 188 mg/dL — ABNORMAL HIGH (ref 70–99)

## 2020-08-18 MED ORDER — ENSURE ENLIVE PO LIQD
237.0000 mL | Freq: Two times a day (BID) | ORAL | Status: DC
  Administered 2020-08-19 (×2): 237 mL via ORAL

## 2020-08-18 MED ORDER — VANCOMYCIN HCL IN DEXTROSE 1-5 GM/200ML-% IV SOLN: 1000.0000 mg | Freq: Once | INTRAVENOUS | Status: DC

## 2020-08-18 MED ORDER — POTASSIUM CHLORIDE 10 MEQ/100ML IV SOLN: 10.0000 meq | Freq: Once | INTRAVENOUS | Status: DC

## 2020-08-18 MED ORDER — COLCHICINE 0.6 MG PO TABS: 0.6000 mg | ORAL_TABLET | Freq: Every day | ORAL | Status: DC

## 2020-08-18 MED ORDER — PRAVASTATIN SODIUM 10 MG PO TABS
20.0000 mg | ORAL_TABLET | Freq: Every day | ORAL | Status: DC
  Administered 2020-08-19 – 2020-08-22 (×4): 20 mg via ORAL
  Filled 2020-08-18 (×4): qty 2

## 2020-08-18 MED ORDER — POTASSIUM CHLORIDE 10 MEQ/100ML IV SOLN
10.0000 meq | Freq: Once | INTRAVENOUS | Status: AC
  Administered 2020-08-18: 10 meq via INTRAVENOUS
  Filled 2020-08-18: qty 100

## 2020-08-18 MED ORDER — GUAIFENESIN-DM 100-10 MG/5ML PO SYRP
5.0000 mL | ORAL_SOLUTION | ORAL | Status: DC | PRN
  Administered 2020-08-20 – 2020-08-21 (×2): 5 mL via ORAL
  Filled 2020-08-18 (×2): qty 5

## 2020-08-18 MED ORDER — PANTOPRAZOLE SODIUM 40 MG PO TBEC
40.0000 mg | DELAYED_RELEASE_TABLET | Freq: Every day | ORAL | Status: DC
  Administered 2020-08-19 – 2020-08-22 (×4): 40 mg via ORAL
  Filled 2020-08-18 (×4): qty 1

## 2020-08-18 MED ORDER — INSULIN ASPART 100 UNIT/ML ~~LOC~~ SOLN
0.0000 [IU] | Freq: Three times a day (TID) | SUBCUTANEOUS | Status: DC
  Administered 2020-08-19 (×3): 2 [IU] via SUBCUTANEOUS
  Administered 2020-08-20: 3 [IU] via SUBCUTANEOUS
  Administered 2020-08-20 (×2): 2 [IU] via SUBCUTANEOUS
  Administered 2020-08-21: 3 [IU] via SUBCUTANEOUS
  Administered 2020-08-21 – 2020-08-22 (×5): 2 [IU] via SUBCUTANEOUS

## 2020-08-18 MED ORDER — INSULIN ASPART 100 UNIT/ML ~~LOC~~ SOLN
0.0000 [IU] | Freq: Every day | SUBCUTANEOUS | Status: DC
  Administered 2020-08-19: 3 [IU] via SUBCUTANEOUS
  Administered 2020-08-21: 2 [IU] via SUBCUTANEOUS

## 2020-08-18 MED ORDER — SODIUM CHLORIDE 0.9 % IV SOLN
1.0000 g | Freq: Two times a day (BID) | INTRAVENOUS | Status: DC
  Administered 2020-08-19: 1 g via INTRAVENOUS
  Filled 2020-08-18 (×10): qty 1

## 2020-08-18 MED ORDER — ENOXAPARIN SODIUM 40 MG/0.4ML ~~LOC~~ SOLN
40.0000 mg | SUBCUTANEOUS | Status: DC
  Administered 2020-08-18 – 2020-08-21 (×4): 40 mg via SUBCUTANEOUS
  Filled 2020-08-18 (×4): qty 0.4

## 2020-08-18 MED ORDER — SODIUM CHLORIDE 0.9 % IV SOLN
1.0000 g | Freq: Once | INTRAVENOUS | Status: AC
  Administered 2020-08-18: 1 g via INTRAVENOUS
  Filled 2020-08-18: qty 1

## 2020-08-18 MED ORDER — NIFEDIPINE ER OSMOTIC RELEASE 30 MG PO TB24
90.0000 mg | ORAL_TABLET | Freq: Every day | ORAL | Status: DC
  Administered 2020-08-19 – 2020-08-22 (×4): 90 mg via ORAL
  Filled 2020-08-18 (×4): qty 3

## 2020-08-18 MED ORDER — POTASSIUM CHLORIDE CRYS ER 20 MEQ PO TBCR
40.0000 meq | EXTENDED_RELEASE_TABLET | Freq: Four times a day (QID) | ORAL | Status: AC
  Administered 2020-08-18 – 2020-08-19 (×3): 40 meq via ORAL
  Filled 2020-08-18 (×3): qty 2

## 2020-08-18 NOTE — H&P (Addendum)
TRH H&P   Patient Demographics:    Robin Mcclure, is a 87 y.o. female  MRN: [id number]   DOB - 10-22-1922  Admit Date - 08/18/2020  Outpatient Primary MD for the patient is Lujean Amel, MD  Referring MD/NP/PA: Dr Roderic Palau    Patient coming from: Home  Chief Complaint  Patient presents with  . Shortness of Breath      HPI:    Robin Mcclure  is a 87 y.o. female,with a history of T2DM with peripheral neuropathy, HTN, HLD, colon CA s/p colectomy and chemotherapy in remission, patient with recent hospitalization last month related to pericarditis/pericardial effusion, which seems to be improved by time of discharge, where she was discharged on colchicine and ibuprofen. -Patient presents to ED secondary to complaints of worsening shortness of breath, cough with yellow productive phlegm, patient reports generalized weakness, fatigue, worsening dyspnea, but reports she maintains at baseline 2 L nasal cannula, reports productive cough with yellow phlegm, she denies fever, chills, chest pain, nausea or vomiting, he denies any orthopnea, lower extremity edema or leg pain. - in ED her chest x-ray was significant for left medial base opacity suspicious for pneumonia, she was afebrile, she is at her baseline on 2 L nasal cannula, work-up significant for hypokalemia potassium of 2.5, [facility] consulted to admit.    Review of systems:    In addition to the HPI above,  No Fever-chills, poor generalized weakness and fatigue No Headache, No changes with Vision or hearing, No problems swallowing food or Liquids, No Chest pain, she reports cough, productive with yellow phlegm, she reports dyspnea No Abdominal pain, No Nausea or Vommitting, Bowel movements are regular, No Blood in stool or Urine, No dysuria, No new skin rashes or bruises, No new joints pains-aches,  No new weakness,  tingling, numbness in any extremity, No recent weight gain or loss, No polyuria, polydypsia or polyphagia, No significant Mental Stressors.  A full 10 point Review of Systems was done, except as stated above, all other Review of Systems were negative.   With Past History of the following :    Past Medical History:  Diagnosis Date  . Diabetes mellitus without complication ([place])   . Hyperlipidemia   . Hypertension       Past Surgical History:  Procedure Laterality Date  . ABDOMINAL HYSTERECTOMY    . BIOPSY  01/19/2018   Procedure: BIOPSY;  Surgeon: Ronnette Juniper, MD;  Location: [place];  Service: Gastroenterology;;  . CHOLECYSTECTOMY    . ESOPHAGOGASTRODUODENOSCOPY (EGD) WITH PROPOFOL N/A 01/19/2018   Procedure: ESOPHAGOGASTRODUODENOSCOPY (EGD) WITH PROPOFOL;  Surgeon: Ronnette Juniper, MD;  Location: [place];  Service: Gastroenterology;  Laterality: N/A;  . HIP SURGERY        Social History:     Social History   Tobacco Use  . Smoking status: Never Smoker  . Smokeless tobacco: Never Used  Substance Use  Topics  . Alcohol use: No        Family History :    History reviewed. No pertinent family history.    Home Medications:   Prior to Admission medications   Medication Sig Start Date End Date Taking? Authorizing Provider  acetaminophen (TYLENOL) 325 MG tablet Take 650 mg by mouth every 6 (six) hours as needed for mild pain.    [provider]  albuterol (PROVENTIL HFA;VENTOLIN HFA) 108 (90 Base) MCG/ACT inhaler Inhale 1-2 puffs into the lungs every 6 (six) hours as needed for wheezing or shortness of breath.    [provider]  B Complex Vitamins (B COMPLEX PO) Take 1 tablet by mouth daily.    [provider]  Cholecalciferol 25 MCG (1000 UT) tablet Take 1,000 Units by mouth daily.     [provider]  colchicine 0.6 MG tablet Take 1 tablet (0.6 mg total) by mouth daily. 06/24/20   Patrecia Pour, MD  dextromethorphan (DELSYM) 30  MG/5ML liquid Take 2.5 mLs (15 mg total) by mouth every 6 (six) hours as needed for cough. 06/24/20   Patrecia Pour, MD  Fluticasone-Salmeterol (ADVAIR) 500-50 MCG/DOSE AEPB Inhale 1 puff into the lungs 2 (two) times daily.    [provider]  glipiZIDE (GLUCOTROL XL) 2.5 MG 24 hr tablet Take 2.5 mg by mouth daily with breakfast.    [provider]  ibuprofen (ADVIL) 600 MG tablet Take 1 tablet (600 mg total) by mouth 3 (three) times daily. 06/23/20   Patrecia Pour, MD  Multiple Minerals (CALCIUM/MAGNESIUM/ZINC PO) Take 1 tablet by mouth 2 (two) times daily.    [provider]  Multiple Vitamins-Minerals ([employer] SPECTRAVITE SENIOR PO) Take 1 tablet by mouth daily.     [provider]  NIFEdipine (ADALAT CC) 90 MG 24 hr tablet Take 90 mg by mouth daily.    [provider]  ondansetron (ZOFRAN) 4 MG tablet Take 4 mg by mouth every 8 (eight) hours as needed for nausea. 01/20/18   [provider]  pantoprazole (PROTONIX) 40 MG tablet Take 40 mg by mouth daily. 03/10/18   [provider]  pravastatin (PRAVACHOL) 20 MG tablet Take 20 mg by mouth daily.    [provider]  sitaGLIPtin (JANUVIA) 50 MG tablet Take 1 tablet (50 mg total) by mouth daily. 06/23/20   Patrecia Pour, MD  sucralfate (CARAFATE) 1 GM/10ML suspension Take 10 mLs (1 g total) by mouth 4 (four) times daily -  with meals and at bedtime. Patient taking differently: Take 1 g by mouth 2 (two) times daily. 01/19/18   Deno Etienne, DO     Allergies:    No Known Allergies   Physical Exam:   Vitals  Blood pressure (!) 123/50, pulse 71, temperature 97.8 F (36.6 C), temperature source Oral, resp. rate (!) 33, height 5\' 5"  (1.651 m), weight 69.9 kg, SpO2 93 %.   1. General frail elderly female, laying in bed, no apparent distress  2. Normal affect and insight, Not Suicidal or Homicidal, Awake Alert, Oriented X 3.  3. No F.N deficits, ALL C.Nerves Intact, Strength 5/5 all 4  extremities, Sensation intact all 4 extremities, Plantars down going.  4. Ears and Eyes appear Normal, Conjunctivae clear, PERRLA. Moist Oral Mucosa.  5. Supple Neck, No JVD, No cervical lymphadenopathy appriciated, No Carotid Bruits.  6. Symmetrical Chest wall movement, Good air movement bilaterally, mildly tachypneic, but no increased work of breathing, she had left lung  base rhonchi.  7. RRR, No Gallops, Rubs or Murmurs, No Parasternal Heave.  8. Positive Bowel Sounds, Abdomen Soft, No tenderness, No organomegaly appriciated,No rebound -guarding or rigidity.  9.  No Cyanosis, Normal Skin Turgor, No Skin Rash or Bruise.  10. Good muscle tone,  joints appear normal , no effusions, Normal ROM.  11. No Palpable Lymph Nodes in Neck or Axillae     Data Review:    CBC Recent Labs  Lab 08/18/20 1540  WBC 8.9  HGB 9.2*  HCT 30.3*  PLT 289  MCV 87.1  MCH 26.4  MCHC 30.4  RDW 17.5*  LYMPHSABS 1.0  MONOABS 0.8  EOSABS 0.2  BASOSABS 0.1   ------------------------------------------------------------------------------------------------------------------  Chemistries  Recent Labs  Lab 08/18/20 1540  NA 135  K 2.5*  CL 96*  CO2 29  GLUCOSE 217*  BUN 15  CREATININE 0.69  CALCIUM 10.3  AST 13*  ALT 12  ALKPHOS 82  BILITOT 0.7   ------------------------------------------------------------------------------------------------------------------ estimated creatinine clearance is 41.3 mL/min (by C-G formula based on SCr of 0.69 mg/dL). ------------------------------------------------------------------------------------------------------------------ No results for input(s): TSH, T4TOTAL, T3FREE, THYROIDAB in the last 72 hours.  Invalid input(s): FREET3  Coagulation profile No results for input(s): INR, PROTIME in the last 168 hours. ------------------------------------------------------------------------------------------------------------------- No results for input(s):  DDIMER in the last 72 hours. -------------------------------------------------------------------------------------------------------------------  Cardiac Enzymes No results for input(s): CKMB, TROPONINI, MYOGLOBIN in the last 168 hours.  Invalid input(s): CK ------------------------------------------------------------------------------------------------------------------    Component Value Date/Time   BNP 277.0 (H) 06/20/2020 1030     ---------------------------------------------------------------------------------------------------------------  Urinalysis    Component Value Date/Time   COLORURINE YELLOW 06/20/2020 1654   APPEARANCEUR CLEAR 06/20/2020 1654   LABSPEC 1.033 (H) 06/20/2020 1654   PHURINE 5.0 06/20/2020 1654   GLUCOSEU NEGATIVE 06/20/2020 1654   HGBUR NEGATIVE 06/20/2020 1654   [place] 06/20/2020 1654   KETONESUR NEGATIVE 06/20/2020 1654   PROTEINUR NEGATIVE 06/20/2020 1654   NITRITE POSITIVE (A) 06/20/2020 1654   LEUKOCYTESUR MODERATE (A) 06/20/2020 1654    ----------------------------------------------------------------------------------------------------------------   Imaging Results:    DG Chest Port 1 View  Result Date: 08/18/2020 CLINICAL DATA:  Shortness of breath with decreased oxygen saturation EXAM: PORTABLE CHEST 1 VIEW COMPARISON:  June 20, 2020 chest radiograph and chest CT FINDINGS: There is ill-defined airspace opacity in each lung base with equivocal pleural effusions bilaterally. There is consolidation more medially in the left base. Heart is mildly enlarged with pulmonary vascularity normal. No adenopathy. Bones are osteoporotic. There is aortic atherosclerosis. IMPRESSION: Suspected pneumonia medial left base. Atelectatic change with questionable developing pneumonia elsewhere in the lung bases. Equivocal pleural effusions bilaterally. There is a degree of cardiomegaly. Bones osteoporotic. Aortic Atherosclerosis (ICD10-I70.0).  Electronically Signed   By: Lowella Grip III M.D.   On: 08/18/2020 16:03    My personal review of EKG: pending   Assessment & Plan:    Active Problems:   Pneumonia   Diabetes ([place])   Essential hypertension   Hypokalemia  Pneumonia -Presents with cough, shortness of breath, x-ray significant for opacity in medial left base -He is admitted under pneumonia pathway, follow blood cultures, will obtain sputum cultures, strep pneumo antigen, Legionella antigen, continue with cefepime, she will be started on incentive spirometer, flutter valve, she will be kept on Robitussin as needed for cough. -Patient is at her  baseline at 2 L nasal cannula since recent admission  History of pericardial effusion -This was during her previous  admission, no pulsus paradoxicus , cardiac silhouette is  only mildly enlarged on chest x-ray. -Obtain EKG. -she was treated with 30 days of colchicine and Ibuprofen  Hypertension -Continue with nifedipine and  GERD -Continue with PPI  Hypokalemia - severe, 2.5, being repleted, will monitor on telemetry, will check magnesium level as well  Type 2 diabetes mellitus -Most recent A1c is 7.3, will hold Tradjenta and glipizide and will keep on insulin sliding scale during hospital stay  DVT Prophylaxis ovenox   AM Labs Ordered, also please review Full Orders  Family Communication: Admission, patients condition and plan of care including tests being ordered have been discussed with the patient and daughter by phone* who indicate understanding and agree with the plan and Code Status.  Code Status full, confirmed by patient  Likely DC to home  Condition GUARDED  Consults called: None  Admission status: Inpatient  Time spent in minutes : 60 minutes   Phillips Climes M.D on 08/18/2020 at 5:53 PM   [facility] - Office  [phone]

## 2020-08-18 NOTE — ED Notes (Signed)
Sats 91-93 % on O2@2l /m

## 2020-08-18 NOTE — ED Provider Notes (Signed)
[facility] EMERGENCY DEPARTMENT Provider Note   CSN: [id number] Arrival date & time: 08/18/20  1502     History Chief Complaint  Patient presents with  . Shortness of Breath    Robin Mcclure is a 87 y.o. female.  Patient complains of some shortness of breath cough yellow sputum production  The history is provided by the patient and medical records. No language interpreter was used.  Shortness of Breath Severity:  Moderate Onset quality:  Sudden Timing:  Constant Progression:  Worsening Chronicity:  New Context: activity   Relieved by:  Nothing Worsened by:  Nothing Ineffective treatments:  None tried Associated symptoms: cough   Associated symptoms: no abdominal pain, no chest pain, no headaches and no rash        Past Medical History:  Diagnosis Date  . Diabetes mellitus without complication ([place])   . Hyperlipidemia   . Hypertension     Patient Active Problem List   Diagnosis Date Noted  . Pericarditis 06/20/2020  . Pericardial effusion   . Presbycusis of both ears 09/29/2018    Past Surgical History:  Procedure Laterality Date  . ABDOMINAL HYSTERECTOMY    . BIOPSY  01/19/2018   Procedure: BIOPSY;  Surgeon: Ronnette Juniper, MD;  Location: [place];  Service: Gastroenterology;;  . CHOLECYSTECTOMY    . ESOPHAGOGASTRODUODENOSCOPY (EGD) WITH PROPOFOL N/A 01/19/2018   Procedure: ESOPHAGOGASTRODUODENOSCOPY (EGD) WITH PROPOFOL;  Surgeon: Ronnette Juniper, MD;  Location: [place];  Service: Gastroenterology;  Laterality: N/A;  . HIP SURGERY       OB History   No obstetric history on file.     History reviewed. No pertinent family history.  Social History   Tobacco Use  . Smoking status: Never Smoker  . Smokeless tobacco: Never Used  Substance Use Topics  . Alcohol use: No  . Drug use: No    Home Medications Prior to Admission medications   Medication Sig Start Date End Date Taking? Authorizing Provider  acetaminophen (TYLENOL) 325 MG tablet Take  650 mg by mouth every 6 (six) hours as needed for mild pain.    [provider]  albuterol (PROVENTIL HFA;VENTOLIN HFA) 108 (90 Base) MCG/ACT inhaler Inhale 1-2 puffs into the lungs every 6 (six) hours as needed for wheezing or shortness of breath.    [provider]  B Complex Vitamins (B COMPLEX PO) Take 1 tablet by mouth daily.    [provider]  Cholecalciferol 25 MCG (1000 UT) tablet Take 1,000 Units by mouth daily.     [provider]  colchicine 0.6 MG tablet Take 1 tablet (0.6 mg total) by mouth daily. 06/24/20   Patrecia Pour, MD  dextromethorphan (DELSYM) 30 MG/5ML liquid Take 2.5 mLs (15 mg total) by mouth every 6 (six) hours as needed for cough. 06/24/20   Patrecia Pour, MD  Fluticasone-Salmeterol (ADVAIR) 500-50 MCG/DOSE AEPB Inhale 1 puff into the lungs 2 (two) times daily.    [provider]  glipiZIDE (GLUCOTROL XL) 2.5 MG 24 hr tablet Take 2.5 mg by mouth daily with breakfast.    [provider]  ibuprofen (ADVIL) 600 MG tablet Take 1 tablet (600 mg total) by mouth 3 (three) times daily. 06/23/20   Patrecia Pour, MD  Multiple Minerals (CALCIUM/MAGNESIUM/ZINC PO) Take 1 tablet by mouth 2 (two) times daily.    [provider]  Multiple Vitamins-Minerals ([employer]S SPECTRAVITE SENIOR PO) Take 1 tablet by mouth daily.     [provider]  NIFEdipine (ADALAT  CC) 90 MG 24 hr tablet Take 90 mg by mouth daily.    [provider]  ondansetron (ZOFRAN) 4 MG tablet Take 4 mg by mouth every 8 (eight) hours as needed for nausea. 01/20/18   [provider]  pantoprazole (PROTONIX) 40 MG tablet Take 40 mg by mouth daily. 03/10/18   [provider]  pravastatin (PRAVACHOL) 20 MG tablet Take 20 mg by mouth daily.    [provider]  sitaGLIPtin (JANUVIA) 50 MG tablet Take 1 tablet (50 mg total) by mouth daily. 06/23/20   Patrecia Pour, MD  sucralfate (CARAFATE) 1 GM/10ML suspension Take 10 mLs (1 g total) by  mouth 4 (four) times daily -  with meals and at bedtime. Patient taking differently: Take 1 g by mouth 2 (two) times daily. 01/19/18   Deno Etienne, DO    Allergies    Patient has no known allergies.  Review of Systems   Review of Systems  Constitutional: Negative for appetite change and fatigue.  HENT: Negative for congestion, ear discharge and sinus pressure.   Eyes: Negative for discharge.  Respiratory: Positive for cough and shortness of breath.   Cardiovascular: Negative for chest pain.  Gastrointestinal: Negative for abdominal pain and diarrhea.  Genitourinary: Negative for frequency and hematuria.  Musculoskeletal: Negative for back pain.  Skin: Negative for rash.  Neurological: Negative for seizures and headaches.  Psychiatric/Behavioral: Negative for hallucinations.    Physical Exam Updated Vital Signs BP (!) 123/50   Pulse 71   Temp 97.8 F (36.6 C) (Oral)   Resp (!) 33   Ht 5\' 5"  (1.651 m)   Wt 69.9 kg   SpO2 93%   BMI 25.63 kg/m   Physical Exam Vitals and nursing note reviewed.  Constitutional:      Appearance: She is well-developed.  HENT:     Head: Normocephalic.     Nose: Nose normal.  Eyes:     General: No scleral icterus.    Conjunctiva/sclera: Conjunctivae normal.  Neck:     Thyroid: No thyromegaly.  Cardiovascular:     Rate and Rhythm: Normal rate and regular rhythm.     Heart sounds: No murmur heard. No friction rub. No gallop.   Pulmonary:     Breath sounds: No stridor. No wheezing or rales.  Chest:     Chest wall: No tenderness.  Abdominal:     General: There is no distension.     Tenderness: There is no abdominal tenderness. There is no rebound.  Musculoskeletal:        General: Normal range of motion.     Cervical back: Neck supple.  Lymphadenopathy:     Cervical: No cervical adenopathy.  Skin:    Findings: No erythema or rash.  Neurological:     Mental Status: She is alert and oriented to person, place, and time.     Motor: No  abnormal muscle tone.     Coordination: Coordination normal.  Psychiatric:        Behavior: Behavior normal.     ED Results / Procedures / Treatments   Labs (all labs ordered are listed, but only abnormal results are displayed) Labs Reviewed  CBC WITH DIFFERENTIAL/PLATELET - Abnormal; Notable for the following components:      Result Value   RBC 3.48 (*)    Hemoglobin 9.2 (*)    HCT 30.3 (*)    RDW 17.5 (*)    All other components within normal limits  COMPREHENSIVE METABOLIC PANEL -  Abnormal; Notable for the following components:   Potassium 2.5 (*)    Chloride 96 (*)    Glucose, Bld 217 (*)    Albumin 3.2 (*)    AST 13 (*)    All other components within normal limits  RESP PANEL BY RT-PCR (FLU A&B, COVID) [id number]  CULTURE, BLOOD (ROUTINE X 2)  CULTURE, BLOOD (ROUTINE X 2)  MRSA PCR SCREENING    EKG None  Radiology DG Chest Port 1 View  Result Date: 08/18/2020 CLINICAL DATA:  Shortness of breath with decreased oxygen saturation EXAM: PORTABLE CHEST 1 VIEW COMPARISON:  June 20, 2020 chest radiograph and chest CT FINDINGS: There is ill-defined airspace opacity in each lung base with equivocal pleural effusions bilaterally. There is consolidation more medially in the left base. Heart is mildly enlarged with pulmonary vascularity normal. No adenopathy. Bones are osteoporotic. There is aortic atherosclerosis. IMPRESSION: Suspected pneumonia medial left base. Atelectatic change with questionable developing pneumonia elsewhere in the lung bases. Equivocal pleural effusions bilaterally. There is a degree of cardiomegaly. Bones osteoporotic. Aortic Atherosclerosis (ICD10-I70.0). Electronically Signed   By: Lowella Grip III M.D.   On: 08/18/2020 16:03    Procedures Procedures   Medications Ordered in ED Medications  ceFEPIme (MAXIPIME) 1 g in sodium chloride 0.9 % 100 mL IVPB (has no administration in time range)  potassium chloride 10 mEq in 100 mL IVPB (10 mEq  Intravenous New Bag/Given 08/18/20 1703)  potassium chloride 10 mEq in 100 mL IVPB (has no administration in time range)    ED Course  I have reviewed the triage vital signs and the nursing notes.  Pertinent labs & imaging results that were available during my care of the patient were reviewed by me and considered in my medical decision making (see chart for details). CRITICAL CARE Performed by: Milton Ferguson Total critical care time: 40 minutes Critical care time was exclusive of separately billable procedures and treating other patients. Critical care was necessary to treat or prevent imminent or life-threatening deterioration. Critical care was time spent personally by me on the following activities: development of treatment plan with patient and/or surrogate as well as nursing, discussions with consultants, evaluation of patient's response to treatment, examination of patient, obtaining history from patient or surrogate, ordering and performing treatments and interventions, ordering and review of laboratory studies, ordering and review of radiographic studies, pulse oximetry and re-evaluation of patient's condition.    MDM Rules/Calculators/A&P                          Patient with hypokalemia and hospital-acquired pneumonia.  She will be admitted for IV antibiotics and potassium Final Clinical Impression(s) / ED Diagnoses Final diagnoses:  Healthcare-associated pneumonia    Rx / DC Orders ED Discharge Orders    None       Milton Ferguson, MD 08/18/20 [phone]

## 2020-08-18 NOTE — Progress Notes (Addendum)
Pharmacy Antibiotic Note  Robin Mcclure is a 87 y.o. female admitted on 08/18/2020 with pneumonia.  Pharmacy has been consulted for vancomycin and cefepime dosing. discssed low MRSA risk, good coverage with cefepime, and risk of nephrotoxicity with vanc and cefepime. MD agreed with hold vancomycin for now, MRSA Screen ordered.   Plan: Hold vancomycin Cefepime 1g Q12 hr  Monitor cultures, MRSA PCR, clinical status, renal fx Narrow abx as able and f/u duration    Height: 5\' 5"  (165.1 cm) Weight: 69.9 kg (154 lb) IBW/kg (Calculated) : 57  Temp (24hrs), Avg:97.8 F (36.6 C), Min:97.8 F (36.6 C), Max:97.8 F (36.6 C)  Recent Labs  Lab 08/18/20 1540  WBC 8.9  CREATININE 0.69    Estimated Creatinine Clearance: 41.3 mL/min (by C-G formula based on SCr of 0.69 mg/dL).    No Known Allergies  Antimicrobials this admission: Cefepime 3/31 >>    Microbiology results: 3/31 MRSA PCR: pend  Thank you for allowing pharmacy to be a part of this patient's care.   Benetta Spar, PharmD, BCPS, BCCP Clinical Pharmacist  Please check AMION for all [place] phone numbers After 10:00 PM, call [place] [phone]

## 2020-08-18 NOTE — ED Triage Notes (Signed)
Pt lives with daughter. Daughter stating she noticed pts oxygen dropped. Normally on 2 L [place] and oxygen sat in low 80's. They increased oxygen with no increased in sat. EMS arrived, placed pt on 6L, pt oxygen sat 91%.  96% on 3L [place] in triage  Pt does c/o of sob x 3 days

## 2020-08-19 DIAGNOSIS — E876 Hypokalemia: Secondary | ICD-10-CM

## 2020-08-19 DIAGNOSIS — J9611 Chronic respiratory failure with hypoxia: Secondary | ICD-10-CM | POA: Diagnosis not present

## 2020-08-19 DIAGNOSIS — E1140 Type 2 diabetes mellitus with diabetic neuropathy, unspecified: Secondary | ICD-10-CM

## 2020-08-19 DIAGNOSIS — J189 Pneumonia, unspecified organism: Secondary | ICD-10-CM

## 2020-08-19 LAB — GLUCOSE, CAPILLARY
Glucose-Capillary: 175 mg/dL — ABNORMAL HIGH (ref 70–99)
Glucose-Capillary: 178 mg/dL — ABNORMAL HIGH (ref 70–99)
Glucose-Capillary: 181 mg/dL — ABNORMAL HIGH (ref 70–99)
Glucose-Capillary: 259 mg/dL — ABNORMAL HIGH (ref 70–99)

## 2020-08-19 LAB — CBC
HCT: 28.9 % — ABNORMAL LOW (ref 36.0–46.0)
Hemoglobin: 8.6 g/dL — ABNORMAL LOW (ref 12.0–15.0)
MCH: 26.3 pg (ref 26.0–34.0)
MCHC: 29.8 g/dL — ABNORMAL LOW (ref 30.0–36.0)
MCV: 88.4 fL (ref 80.0–100.0)
Platelets: 257 10*3/uL (ref 150–400)
RBC: 3.27 MIL/uL — ABNORMAL LOW (ref 3.87–5.11)
RDW: 17.5 % — ABNORMAL HIGH (ref 11.5–15.5)
WBC: 7.7 10*3/uL (ref 4.0–10.5)
nRBC: 0 % (ref 0.0–0.2)

## 2020-08-19 LAB — BASIC METABOLIC PANEL
Anion gap: 9 (ref 5–15)
BUN: 13 mg/dL (ref 8–23)
CO2: 27 mmol/L (ref 22–32)
Calcium: 9.9 mg/dL (ref 8.9–10.3)
Chloride: 101 mmol/L (ref 98–111)
Creatinine, Ser: 0.59 mg/dL (ref 0.44–1.00)
GFR, Estimated: >60 mL/min (ref >60)
Glucose, Bld: 186 mg/dL — ABNORMAL HIGH (ref 70–99)
Potassium: 3.6 mmol/L (ref 3.5–5.1)
Sodium: 137 mmol/L (ref 135–145)

## 2020-08-19 MED ORDER — BUDESONIDE 0.5 MG/2ML IN SUSP
0.5000 mg | Freq: Two times a day (BID) | RESPIRATORY_TRACT | Status: DC
  Administered 2020-08-19 – 2020-08-22 (×6): 0.5 mg via RESPIRATORY_TRACT
  Filled 2020-08-19 (×6): qty 2

## 2020-08-19 MED ORDER — GLUCERNA SHAKE PO LIQD
237.0000 mL | Freq: Three times a day (TID) | ORAL | Status: DC
  Administered 2020-08-19 – 2020-08-22 (×9): 237 mL via ORAL

## 2020-08-19 MED ORDER — SODIUM CHLORIDE 0.9 % IV SOLN
2.0000 g | Freq: Two times a day (BID) | INTRAVENOUS | Status: DC
  Administered 2020-08-19 – 2020-08-21 (×4): 2 g via INTRAVENOUS
  Filled 2020-08-19 (×5): qty 2

## 2020-08-19 MED ORDER — IPRATROPIUM-ALBUTEROL 0.5-2.5 (3) MG/3ML IN SOLN
3.0000 mL | Freq: Four times a day (QID) | RESPIRATORY_TRACT | Status: DC
  Administered 2020-08-19 – 2020-08-22 (×10): 3 mL via RESPIRATORY_TRACT
  Filled 2020-08-19 (×11): qty 3

## 2020-08-19 NOTE — Plan of Care (Signed)
  Problem: Acute Rehab PT Goals(only PT should resolve) Goal: Pt Will Go Supine/Side To Sit Outcome: Progressing Flowsheets (Taken 08/19/2020 1125) Pt will go Supine/Side to Sit: with supervision Goal: Patient Will Transfer Sit To/From Stand Outcome: Progressing Flowsheets (Taken 08/19/2020 1125) Patient will transfer sit to/from stand: with min guard assist Goal: Pt Will Transfer Bed To Chair/Chair To Bed Outcome: Progressing Flowsheets (Taken 08/19/2020 1125) Pt will Transfer Bed to Chair/Chair to Bed: min guard assist Goal: Pt Will Ambulate Outcome: Progressing Flowsheets (Taken 08/19/2020 1125) Pt will Ambulate:  25 feet  with min guard assist  with minimal assist  with rolling walker   11:26 AM, 08/19/20 Lonell Grandchild, MPT Physical Therapist with [facility] [phone] office [phone] mobile phone

## 2020-08-19 NOTE — Progress Notes (Signed)
Pharmacy Antibiotic Note  Robin Mcclure is a 87 y.o. female admitted on 08/18/2020 with pneumonia.  Pharmacy has been consulted for cefepime dosing.  Patient with HCAP, will adjust cefepime  Plan: Increase Cefepime 2g Q12 hr  Monitor cultures, MRSA PCR, clinical status, renal fx Narrow abx as able and f/u duration    Height: 5\' 5"  (165.1 cm) Weight: 69.9 kg (154 lb) IBW/kg (Calculated) : 57  Temp (24hrs), Avg:98.3 F (36.8 C), Min:97.8 F (36.6 C), Max:98.9 F (37.2 C)  Recent Labs  Lab 08/18/20 1540 08/19/20 0538  WBC 8.9 7.7  CREATININE 0.69 0.59    Estimated Creatinine Clearance: 41.3 mL/min (by C-G formula based on SCr of 0.59 mg/dL).    No Known Allergies  Antimicrobials this admission: Cefepime 3/31 >>    Microbiology results: 3/31 BCX: ngtd 3/31 MRSA PCR: pend  Thank you for allowing pharmacy to be a part of this patient's care.  Isac Sarna, BS Vena [place], BCPS Clinical Pharmacist Pager [phone]

## 2020-08-19 NOTE — Evaluation (Signed)
Physical Therapy Evaluation Patient Details Name: Robin Mcclure MRN: [id number] DOB: [birth year] Today's Date: 08/19/2020   History of Present Illness  Robin Mcclure  is a 87 y.o. female,with a history of T2DM with peripheral neuropathy, HTN, HLD, colon CA s/p colectomy and chemotherapy in remission, patient with recent hospitalization last month related to pericarditis/pericardial effusion, which seems to be improved by time of discharge, where she was discharged on colchicine and ibuprofen.  -Patient presents to ED secondary to complaints of worsening shortness of breath, cough with yellow productive phlegm, patient reports generalized weakness, fatigue, worsening dyspnea, but reports she maintains at baseline 2 L nasal cannula, reports productive cough with yellow phlegm, she denies fever, chills, chest pain, nausea or vomiting, he denies any orthopnea, lower extremity edema or leg pain.  - in ED her chest x-ray was significant for left medial base opacity suspicious for pneumonia, she was afebrile, she is at her baseline on 2 L nasal cannula, work-up significant for hypokalemia potassium of 2.5, [facility] hospitalist consulted to admit.    Clinical Impression  Patient demonstrates slow labored cadence ambulating to bathroom requiring Min assist to transfer to commode, fatigues easily due to SOB and SpO2 dropped from 92% to 85% while on 2 LPM during ambulation.  Patient tolerated sitting up in chair after therapy - nursing staff aware.  Patient will benefit from continued physical therapy in hospital and recommended venue below to increase strength, balance, endurance for safe ADLs and gait.    Follow Up Recommendations SNF;Supervision for mobility/OOB;Supervision - Intermittent    Equipment Recommendations  None recommended by PT    Recommendations for Other Services       Precautions / Restrictions Precautions Precautions: Fall Restrictions Weight Bearing Restrictions: No      Mobility  Bed  Mobility Overal bed mobility: Needs Assistance Bed Mobility: Supine to Sit;Sit to Supine     Supine to sit: Min assist Sit to supine: Min guard;Min assist   General bed mobility comments: increased time, labored movement    Transfers Overall transfer level: Needs assistance Equipment used: Rolling walker (2 wheeled) Transfers: Sit to/from [facility] Sit to Stand: Min assist Stand pivot transfers: Min assist       General transfer comment: slow labored movement  Ambulation/Gait Ambulation/Gait assistance: Min assist Gait Distance (Feet): 15 Feet Assistive device: Rolling walker (2 wheeled) Gait Pattern/deviations: Decreased step length - right;Decreased step length - left;Decreased stride length Gait velocity: decreased   General Gait Details: slow labored unsteady cadence without loss of balance, limited mostly due to fatigue and SpO2 dropping from 92% to 85%  Stairs            Wheelchair Mobility    Modified Rankin (Stroke Patients Only)       Balance Overall balance assessment: Needs assistance Sitting-balance support: Feet supported;No upper extremity supported Sitting balance-Leahy Scale: Fair Sitting balance - Comments: fair/good seated at EOB   Standing balance support: During functional activity;Bilateral upper extremity supported Standing balance-Leahy Scale: Fair Standing balance comment: fair using RW                             Pertinent Vitals/Pain Pain Assessment: No/denies pain    Home Living Family/patient expects to be discharged to:: Private residence Living Arrangements: Children Available Help at Discharge: Family;Available 24 hours/day Type of Home: House Home Access: Ramped entrance     Home Layout: One level Home Equipment: Walker - 2 wheels;Transport chair;Shower  seat      Prior Function Level of Independence: Needs assistance   Gait / Transfers Assistance Needed: Assisted household ambulator  using RW  ADL's / Homemaking Assistance Needed: assisted by family        Hand Dominance        Extremity/Trunk Assessment   Upper Extremity Assessment Upper Extremity Assessment: Generalized weakness    Lower Extremity Assessment Lower Extremity Assessment: Generalized weakness    Cervical / Trunk Assessment Cervical / Trunk Assessment: Kyphotic  Communication   Communication: HOH  Cognition Arousal/Alertness: Awake/alert Behavior During Therapy: WFL for tasks assessed/performed Overall Cognitive Status: Within Functional Limits for tasks assessed                                        General Comments      Exercises     Assessment/Plan    PT Assessment Patient needs continued PT services  PT Problem List Decreased strength;Decreased activity tolerance;Decreased balance;Decreased mobility       PT Treatment Interventions DME instruction;Gait training;Stair training;Functional mobility training;Therapeutic activities;Therapeutic exercise;Patient/family education;Balance training    PT Goals (Current goals can be found in the Care Plan section)  Acute Rehab PT Goals Patient Stated Goal: return home with family to assist PT Goal Formulation: With patient Time For Goal Achievement: 09/02/20 Potential to Achieve Goals: Good    Frequency Min 3X/week   Barriers to discharge        Co-evaluation               AM-PAC PT "6 Clicks" Mobility  Outcome Measure Help needed turning from your back to your side while in a flat bed without using bedrails?: A Little Help needed moving from lying on your back to sitting on the side of a flat bed without using bedrails?: A Little Help needed moving to and from a bed to a chair (including a wheelchair)?: A Little Help needed standing up from a chair using your arms (e.g., wheelchair or bedside chair)?: A Little Help needed to walk in hospital room?: A Lot Help needed climbing 3-5 steps with a  railing? : A Lot 6 Click Score: 16    End of Session Equipment Utilized During Treatment: Oxygen Activity Tolerance: Patient tolerated treatment well;Patient limited by fatigue Patient left: in chair;with call bell/phone within reach Nurse Communication: Mobility status PT Visit Diagnosis: Unsteadiness on feet (R26.81);Other abnormalities of gait and mobility (R26.89);Muscle weakness (generalized) (M62.81)    Time: 7048-8891 PT Time Calculation (min) (ACUTE ONLY): 34 min   Charges:   PT Evaluation $PT Eval Moderate Complexity: 1 Mod PT Treatments $Therapeutic Activity: 23-37 mins        11:21 AM, 08/19/20 Lonell Grandchild, MPT Physical Therapist with [facility] [phone] office [phone] mobile phone

## 2020-08-19 NOTE — Progress Notes (Signed)
Initial Nutrition Assessment  DOCUMENTATION CODES:   Not applicable  INTERVENTION:  Downgrade diet to Dysphagia 2 due to patient/family request  Glucerna Shake po TID, each supplement provides 220 kcal and 10 grams of protein. Vanilla preferred.  Recommend liberalize diet to least restrictive  - poor oral intake noted  Magic cup daily with dinner (290 kcal, 9 gr protein)  NUTRITION DIAGNOSIS:   Inadequate oral intake related to acute illness (pneumonia) as evidenced by meal completion < 50%.   GOAL:  Patient will meet greater than or equal to 90% of their needs  MONITOR:  PO intake,Supplement acceptance,Weight trends,Labs  REASON FOR ASSESSMENT:   Malnutrition Screening Tool    ASSESSMENT: patient is a 87 yo female. History of DM2, HTN, colon cancer  (in remission), s/p colectomy. She presents with shortness of breath and pneumonia.  Appetite poor currently and meal intake 25% per nursing. Talked with patient Robin Mcclure) and her daughter. Encouraged oral intake meals and supplements to help support healing process.  Review of weight history limited data: 01/19/18-72 kg, 06/24/20: 69.9 kg, 08/18/20:  69.9 kg. No significant change based on available data.  Medications: Insulin, Protonix.   Labs reviewed: Glucose 186 (H). CBG- 188, 175, 178.   NUTRITION - FOCUSED PHYSICAL EXAM: Unable to complete Nutrition-Focused physical exam at this time.  Patient eating lunch.  Diet Order:   Diet Order            Diet heart healthy/carb modified Room service appropriate? Yes; Fluid consistency: Thin  Diet effective now                 EDUCATION NEEDS:  Education needs have been addressed  Skin:  Skin Assessment: Reviewed RN Assessment  Last BM:  3/30 medium type 7  Height:   Ht Readings from Last 1 Encounters:  08/18/20 5\' 5"  (1.651 m)    Weight:   Wt Readings from Last 1 Encounters:  08/18/20 69.9 kg    Ideal Body Weight:   57 kg  BMI:  Body mass index is 25.63  kg/m.  Estimated Nutritional Needs:   Kcal:  1450-1600  Protein:  74-83 gr  Fluid:  >1400 ml daily  Colman Cater MS,RD,CSG,LDN Pager: Shea Evans

## 2020-08-19 NOTE — Progress Notes (Signed)
PROGRESS NOTE    Robin Mcclure  AJO:[id number] DOB: July 03, 1922 DOA: 08/18/2020 PCP: Lujean Amel, MD   Chief Complaint  Patient presents with  . Shortness of Breath    Brief Narrative:  Past medical history of type 2 diabetes, peripheral neuropathy, hypertension, hyperlipidemia, colon cancer status post colectomy and chemotherapy in remission; with a recent hospitalization secondary to pericarditis/pericardial effusion; who presented to the hospital secondary to worsening shortness of breath, productive coughing spells, generalized weakness, mild hypoxia.  Patient found to be requiring higher level of oxygen supplementation and work-up demonstrating acute pneumonia.  Assessment & Plan: 1-left lower lobe pneumonia -Continue to follow culture results -Continue current IV antibiotics -Continue oxygen supplementation; start flutter valve, Pulmicort and DuoNebs. -Follow clinical response.  2-history of pericardial effusion -No significant abnormalities appreciated in cardiac silhouette during initial chest x-ray. -Also denies any chest pain or orthopnea symptoms. -Continue telemetry monitoring.  3-hypertension -Stable and well-controlled -Continue nifedipine.  4-hypokalemia -Continue to follow electrolytes and further replete as needed. -Checking magnesium level.  5-gastroesophageal reflux disease -Continue PPI.  6-history of dementia -Continue constant reorientation -Patient high risk for hospital-acquired delirium.  7-type 2 diabetes -Recent A1c 7.3 -Continue sliding scale insulin -Follow CBGs.  8-respiratory failure -Chronically using 2 L nasal cannula supplementation at home -Continue oxygen supplementation -Continue treatment for pneumonia as mentioned above -Plan obtain correspondence.    DVT prophylaxis: Lovenox Code Status: Full code. Family Communication: Daughter at bedside. Disposition:   Status is: Inpatient  Dispo: The patient is from: Home               Anticipated d/c is to: To be determined              Patient currently no medically stable for discharge; as she is still not mentally at baseline according to family members.  Also experiencing short winded sensation with activity, presence of tachypnea and work of breathing.  Continue IV antibiotics.  Physical therapy has evaluated patient and is recommending a skilled nursing facility.   Difficult to place patient:  no       Consultants:   None   Procedures:  See below for x-ray reports   Antimicrobials:  Cefepime   Subjective: Frail, chronically ill in appearance; short winded with activity and with appreciated tachypnea and increased work of breathing.  Family at bedside also expressing mentation not at baseline.  Objective: Vitals:   08/19/20 0520 08/19/20 1006 08/19/20 1359 08/19/20 1512  BP: (!) 161/78 (!) 188/85 139/84   Pulse: 81 83 86   Resp: 20 17 17    Temp: 98.9 F (37.2 C) 97.8 F (36.6 C) 98.7 F (37.1 C)   TempSrc: Oral Oral Oral   SpO2: 92% 91% 90% 90%  Weight:      Height:        Intake/Output Summary (Last 24 hours) at 08/19/2020 1819 Last data filed at 08/19/2020 1700 Gross per 24 hour  Intake 1040 ml  Output --  Net 1040 ml   Filed Weights   08/18/20 1524  Weight: 69.9 kg    Examination:  General exam: Appreciated tachypnea and short winded sensation with minimal activity.  No fever, no chest pain, no nausea, no vomiting.  Ill in appearance, frail and weak. Respiratory system: Positive rhonchi bilaterally; appreciated tachypnea.  Mild expiratory wheezing on examination. Cardiovascular system: Rate controlled, no rubs, no gallops, no JVD. Gastrointestinal system: Abdomen is nondistended, soft and nontender. No organomegaly or masses felt. Normal bowel sounds heard. Central nervous system:No  focal neurological deficits. Extremities: No cyanosis or clubbing. Skin: No petechiae. Psychiatry: Mood & affect appropriate.  Per daughter at  bedside patient not at baseline for mentation/orientation standpoint.  Can confuse and easily disoriented while trying to accomplish task.    Data Reviewed: I have personally reviewed following labs and imaging studies  CBC: Recent Labs  Lab 08/18/20 1540 08/19/20 0538  WBC 8.9 7.7  NEUTROABS 6.8  --   HGB 9.2* 8.6*  HCT 30.3* 28.9*  MCV 87.1 88.4  PLT 289 267    Basic Metabolic Panel: Recent Labs  Lab 08/18/20 1540 08/18/20 1834 08/19/20 0538  NA 135  --  137  K 2.5*  --  3.6  CL 96*  --  101  CO2 29  --  27  GLUCOSE 217*  --  186*  BUN 15  --  13  CREATININE 0.69  --  0.59  CALCIUM 10.3  --  9.9  MG  --  1.5*  --     GFR: Estimated Creatinine Clearance: 41.3 mL/min (by C-G formula based on SCr of 0.59 mg/dL).  Liver Function Tests: Recent Labs  Lab 08/18/20 1540  AST 13*  ALT 12  ALKPHOS 82  BILITOT 0.7  PROT 7.2  ALBUMIN 3.2*    CBG: Recent Labs  Lab 08/18/20 2033 08/19/20 0731 08/19/20 1148 08/19/20 1615  GLUCAP 188* 175* 178* 181*     Recent Results (from the past 240 hour(s))  Resp Panel by RT-PCR (Flu A&B, Covid) Nasopharyngeal Swab     Status: None   Collection Time: 08/18/20  3:45 PM   Specimen: Nasopharyngeal Swab; Nasopharyngeal(NP) swabs in vial transport medium  Result Value Ref Range Status   SARS Coronavirus 2 by RT PCR NEGATIVE NEGATIVE Final    Comment: (NOTE) SARS-CoV-2 target nucleic acids are NOT DETECTED.  The SARS-CoV-2 RNA is generally detectable in upper respiratory specimens during the acute phase of infection. The lowest concentration of SARS-CoV-2 viral copies this assay can detect is 138 copies/mL. A negative result does not preclude SARS-Cov-2 infection and should not be used as the sole basis for treatment or other patient management decisions. A negative result may occur with  improper specimen collection/handling, submission of specimen other than nasopharyngeal swab, presence of viral mutation(s) within  the areas targeted by this assay, and inadequate number of viral copies(<138 copies/mL). A negative result must be combined with clinical observations, patient history, and epidemiological information. The expected result is Negative.  Fact Sheet for Patients:  EntrepreneurPulse.com.au  Fact Sheet for Healthcare Providers:  IncredibleEmployment.be  This test is no t yet approved or cleared by the [place] FDA and  has been authorized for detection and/or diagnosis of SARS-CoV-2 by FDA under an Emergency Use Authorization (EUA). This EUA will remain  in effect (meaning this test can be used) for the duration of the COVID-19 declaration under Section 564(b)(1) of the Act, 21 U.S.C.section 360bbb-3(b)(1), unless the authorization is terminated  or revoked sooner.       Influenza A by PCR NEGATIVE NEGATIVE Final   Influenza B by PCR NEGATIVE NEGATIVE Final    Comment: (NOTE) The Xpert Xpress SARS-CoV-2/FLU/RSV plus assay is intended as an aid in the diagnosis of influenza from Nasopharyngeal swab specimens and should not be used as a sole basis for treatment. Nasal washings and aspirates are unacceptable for Xpert Xpress SARS-CoV-2/FLU/RSV testing.  Fact Sheet for Patients: EntrepreneurPulse.com.au  Fact Sheet for Healthcare Providers: IncredibleEmployment.be  This test is not yet approved  or cleared by the [place] and has been authorized for detection and/or diagnosis of SARS-CoV-2 by FDA under an Emergency Use Authorization (EUA). This EUA will remain in effect (meaning this test can be used) for the duration of the COVID-19 declaration under Section 564(b)(1) of the Act, 21 U.S.C. section 360bbb-3(b)(1), unless the authorization is terminated or revoked.  Performed at [facility], [address], [place], [place] [address]   Blood culture (routine x 2)     Status: None (Preliminary  result)   Collection Time: 08/18/20  6:34 PM   Specimen: Left Antecubital; Blood  Result Value Ref Range Status   Specimen Description   Final    LEFT ANTECUBITAL Performed at [facility], [address], [place], [place] [address]    Special Requests   Final    BOTTLES DRAWN AEROBIC AND ANAEROBIC Blood Culture adequate volume Performed at [facility], [address], [place], [place] [address]    Culture   Final    NO GROWTH < 12 HOURS Performed at [facility], [address], [place], [place] [address]    Report Status PENDING  Incomplete  Blood culture (routine x 2)     Status: None (Preliminary result)   Collection Time: 08/18/20  6:34 PM   Specimen: BLOOD LEFT HAND  Result Value Ref Range Status   Specimen Description   Final    BLOOD LEFT HAND Performed at [facility], [address], [place], [place] [address]    Special Requests   Final    BOTTLES DRAWN AEROBIC AND ANAEROBIC Blood Culture adequate volume Performed at [facility], [address] [place], [place] [address]    Culture   Final    NO GROWTH < 12 HOURS Performed at [facility], [address], [place],  [address]    Report Status PENDING  Incomplete     Radiology Studies: DG Chest Port 1 View  Result Date: 08/18/2020 CLINICAL DATA:  Shortness of breath with decreased oxygen saturation EXAM: PORTABLE CHEST 1 VIEW COMPARISON:  June 20, 2020 chest radiograph and chest CT FINDINGS: There is ill-defined airspace opacity in each lung base with equivocal pleural effusions bilaterally. There is consolidation more medially in the left base. Heart is mildly enlarged with pulmonary vascularity normal. No adenopathy. Bones are osteoporotic. There is aortic atherosclerosis. IMPRESSION: Suspected pneumonia medial left base. Atelectatic change with questionable developing pneumonia elsewhere in the lung bases. Equivocal pleural effusions bilaterally. There is a degree of  cardiomegaly. Bones osteoporotic. Aortic Atherosclerosis (ICD10-I70.0). Electronically Signed   By: Lowella Grip III M.D.   On: 08/18/2020 16:03    Scheduled Meds: . budesonide (PULMICORT) nebulizer solution  0.5 mg Nebulization BID  . enoxaparin (LOVENOX) injection  40 mg Subcutaneous Q24H  . feeding supplement (GLUCERNA SHAKE)  237 mL Oral TID BM  . insulin aspart  0-5 Units Subcutaneous QHS  . insulin aspart  0-9 Units Subcutaneous TID WC  . ipratropium-albuterol  3 mL Nebulization QID  . NIFEdipine  90 mg Oral Daily  . pantoprazole  40 mg Oral Daily  . pravastatin  20 mg Oral Daily   Continuous Infusions: . ceFEPime (MAXIPIME) IV       LOS: 1 day    Time spent: 35 minutes  Barton Dubois, MD [facility]   To contact the attending provider between 7A-7P or the covering provider during after hours 7P-7A, please log into the web site [email or web address] and access using universal [facility] password for that web site. If you do not have the password, please call the hospital operator.  08/19/2020, 6:19 PM

## 2020-08-20 DIAGNOSIS — J189 Pneumonia, unspecified organism: Secondary | ICD-10-CM | POA: Diagnosis not present

## 2020-08-20 DIAGNOSIS — E876 Hypokalemia: Secondary | ICD-10-CM | POA: Diagnosis not present

## 2020-08-20 DIAGNOSIS — E1140 Type 2 diabetes mellitus with diabetic neuropathy, unspecified: Secondary | ICD-10-CM | POA: Diagnosis not present

## 2020-08-20 DIAGNOSIS — J9611 Chronic respiratory failure with hypoxia: Secondary | ICD-10-CM | POA: Diagnosis not present

## 2020-08-20 LAB — GLUCOSE, CAPILLARY
Glucose-Capillary: 179 mg/dL — ABNORMAL HIGH (ref 70–99)
Glucose-Capillary: 240 mg/dL — ABNORMAL HIGH (ref 70–99)
Glucose-Capillary: 178 mg/dL — ABNORMAL HIGH (ref 70–99)
Glucose-Capillary: 156 mg/dL — ABNORMAL HIGH (ref 70–99)

## 2020-08-20 MED ORDER — IBUPROFEN 600 MG PO TABS
600.0000 mg | ORAL_TABLET | Freq: Two times a day (BID) | ORAL | Status: DC
  Administered 2020-08-20 – 2020-08-22 (×5): 600 mg via ORAL
  Filled 2020-08-20 (×5): qty 1

## 2020-08-20 NOTE — Progress Notes (Signed)
PROGRESS NOTE    Robin Mcclure  VQQ:[id number] DOB: [birth year] DOA: 08/18/2020 PCP: Lujean Amel, MD   Chief Complaint  Patient presents with  . Shortness of Breath    Brief Narrative:  Past medical history of type 2 diabetes, peripheral neuropathy, hypertension, hyperlipidemia, colon cancer status post colectomy and chemotherapy in remission; with a recent hospitalization secondary to pericarditis/pericardial effusion; who presented to the hospital secondary to worsening shortness of breath, productive coughing spells, generalized weakness, mild hypoxia.  Patient found to be requiring higher level of oxygen supplementation and work-up demonstrating acute pneumonia.  Assessment & Plan: 1-left lower lobe pneumonia -Continue to follow culture results -Continue current IV antibiotics -Continue oxygen supplementation; continue flutter valve, Pulmicort and DuoNebs. -Follow clinical response. -Will add twice a day ibuprofen to assist with symptom management and generalized pain.  2-history of pericardial effusion -No significant abnormalities appreciated in cardiac silhouette during initial chest x-ray. -Also denies any chest pain or orthopnea symptoms. -Continue telemetry monitoring.  3-hypertension -Stable and well-controlled -Continue nifedipine.  4-hypokalemia -Continue to follow electrolytes and further replete as needed. -Checking magnesium level.  5-gastroesophageal reflux disease -Continue PPI.  6-history of dementia -Continue constant reorientation -Patient high risk for hospital-acquired delirium.  7-type 2 diabetes -Recent A1c 7.3 -Continue sliding scale insulin -Follow CBGs.  8-chronic respiratory failure -Chronically using 2 L nasal cannula supplementation at home -Continue oxygen supplementation -Continue treatment for pneumonia as mentioned above -Plan obtain correspondence.    DVT prophylaxis: Lovenox Code Status: Full code. Family Communication:  Daughter at bedside. Disposition:   Status is: Inpatient  Dispo: The patient is from: Home              Anticipated d/c is to: To be determined              Patient currently no medically stable for discharge; as she is still not mentally at baseline according to family members.  Also experiencing short winded sensation with activity, presence of tachypnea and work of breathing.  Continue IV antibiotics.  Physical therapy has evaluated patient and is recommending a skilled nursing facility.   Difficult to place patient:  no       Consultants:   None   Procedures:  See below for x-ray reports   Antimicrobials:  Cefepime   Subjective: Chronically ill in appearance; complaining of feeling considerable with diffuse pain in her body and having chills.  Short winded sensation and positive tachypnea reported by daughter at bedside.  Objective: Vitals:   08/19/20 2052 08/20/20 0543 08/20/20 0800 08/20/20 0903  BP: (!) 149/78 (!) 153/91  (!) 128/59  Pulse: 91 88  85  Resp: 19 19  16   Temp: 97.7 F (36.5 C) 98 F (36.7 C)  98.9 F (37.2 C)  TempSrc:    Oral  SpO2: 91% 93% 94% 99%  Weight:      Height:        Intake/Output Summary (Last 24 hours) at 08/20/2020 1500 Last data filed at 08/20/2020 1300 Gross per 24 hour  Intake 820 ml  Output --  Net 820 ml   Filed Weights   08/18/20 1524  Weight: 69.9 kg    Examination: General exam: Alert, awake and following commands.  Patient reports chills, neck pain, back pain and leg pains.  Still having short winded sensation with minimal activity and demonstrating tachypnea. Respiratory system: Positive rhonchi bilaterally; no wheezing, no crackles.  Positive tachypnea appreciated.  Good saturation on 2 L, supplementation. Cardiovascular system: No rubs, no  gallops, no JVD; rate controlled. Gastrointestinal system: Abdomen is nondistended, soft and nontender. No organomegaly or masses felt. Normal bowel sounds heard. Central  nervous system: No focal neurological deficits. Extremities: No cyanosis or clubbing. Skin: No rashes, lesions or ulcers Psychiatry: Mood & affect appropriate.   Data Reviewed: I have personally reviewed following labs and imaging studies  CBC: Recent Labs  Lab 08/18/20 1540 08/19/20 0538  WBC 8.9 7.7  NEUTROABS 6.8  --   HGB 9.2* 8.6*  HCT 30.3* 28.9*  MCV 87.1 88.4  PLT 289 448    Basic Metabolic Panel: Recent Labs  Lab 08/18/20 1540 08/18/20 1834 08/19/20 0538  NA 135  --  137  K 2.5*  --  3.6  CL 96*  --  101  CO2 29  --  27  GLUCOSE 217*  --  186*  BUN 15  --  13  CREATININE 0.69  --  0.59  CALCIUM 10.3  --  9.9  MG  --  1.5*  --     GFR: Estimated Creatinine Clearance: 41.3 mL/min (by C-G formula based on SCr of 0.59 mg/dL).  Liver Function Tests: Recent Labs  Lab 08/18/20 1540  AST 13*  ALT 12  ALKPHOS 82  BILITOT 0.7  PROT 7.2  ALBUMIN 3.2*    CBG: Recent Labs  Lab 08/19/20 1148 08/19/20 1615 08/19/20 2053 08/20/20 0730 08/20/20 1115  GLUCAP 178* 181* 259* 179* 240*     Recent Results (from the past 240 hour(s))  Resp Panel by RT-PCR (Flu A&B, Covid) Nasopharyngeal Swab     Status: None   Collection Time: 08/18/20  3:45 PM   Specimen: Nasopharyngeal Swab; Nasopharyngeal(NP) swabs in vial transport medium  Result Value Ref Range Status   SARS Coronavirus 2 by RT PCR NEGATIVE NEGATIVE Final    Comment: (NOTE) SARS-CoV-2 target nucleic acids are NOT DETECTED.  The SARS-CoV-2 RNA is generally detectable in upper respiratory specimens during the acute phase of infection. The lowest concentration of SARS-CoV-2 viral copies this assay can detect is 138 copies/mL. A negative result does not preclude SARS-Cov-2 infection and should not be used as the sole basis for treatment or other patient management decisions. A negative result may occur with  improper specimen collection/handling, submission of specimen other than nasopharyngeal swab,  presence of viral mutation(s) within the areas targeted by this assay, and inadequate number of viral copies(<138 copies/mL). A negative result must be combined with clinical observations, patient history, and epidemiological information. The expected result is Negative.  Fact Sheet for Patients:  EntrepreneurPulse.com.au  Fact Sheet for Healthcare Providers:  IncredibleEmployment.be  This test is no t yet approved or cleared by the [place] FDA and  has been authorized for detection and/or diagnosis of SARS-CoV-2 by FDA under an Emergency Use Authorization (EUA). This EUA will remain  in effect (meaning this test can be used) for the duration of the COVID-19 declaration under Section 564(b)(1) of the Act, 21 U.S.C.section 360bbb-3(b)(1), unless the authorization is terminated  or revoked sooner.       Influenza A by PCR NEGATIVE NEGATIVE Final   Influenza B by PCR NEGATIVE NEGATIVE Final    Comment: (NOTE) The Xpert Xpress SARS-CoV-2/FLU/RSV plus assay is intended as an aid in the diagnosis of influenza from Nasopharyngeal swab specimens and should not be used as a sole basis for treatment. Nasal washings and aspirates are unacceptable for Xpert Xpress SARS-CoV-2/FLU/RSV testing.  Fact Sheet for Patients: EntrepreneurPulse.com.au  Fact Sheet for Healthcare Providers:  IncredibleEmployment.be  This test is not yet approved or cleared by the [place] and has been authorized for detection and/or diagnosis of SARS-CoV-2 by FDA under an Emergency Use Authorization (EUA). This EUA will remain in effect (meaning this test can be used) for the duration of the COVID-19 declaration under Section 564(b)(1) of the Act, 21 U.S.C. section 360bbb-3(b)(1), unless the authorization is terminated or revoked.  Performed at [facility], [address], [place], [place] [address]   Blood culture (routine x  2)     Status: None (Preliminary result)   Collection Time: 08/18/20  6:34 PM   Specimen: Left Antecubital; Blood  Result Value Ref Range Status   Specimen Description   Final    LEFT ANTECUBITAL Performed at [facility], [address] [place], [place] [address]    Special Requests   Final    BOTTLES DRAWN AEROBIC AND ANAEROBIC Blood Culture adequate volume Performed at [facility], [address], [place], [place] [address]    Culture   Final    NO GROWTH 2 DAYS Performed at [facility], [address] [place], [place] [address]    Report Status PENDING  Incomplete  Blood culture (routine x 2)     Status: None (Preliminary result)   Collection Time: 08/18/20  6:34 PM   Specimen: BLOOD LEFT HAND  Result Value Ref Range Status   Specimen Description   Final    BLOOD LEFT HAND Performed at [facility], [address], [place], [place] [address]    Special Requests   Final    BOTTLES DRAWN AEROBIC AND ANAEROBIC Blood Culture adequate volume Performed at [facility], [address], [place], [place] [address]    Culture   Final    NO GROWTH 2 DAYS Performed at [facility], [address], [place], [place] [address]    Report Status PENDING  Incomplete     Radiology Studies: DG Chest Port 1 View  Result Date: 08/18/2020 CLINICAL DATA:  Shortness of breath with decreased oxygen saturation EXAM: PORTABLE CHEST 1 VIEW COMPARISON:  June 20, 2020 chest radiograph and chest CT FINDINGS: There is ill-defined airspace opacity in each lung base with equivocal pleural effusions bilaterally. There is consolidation more medially in the left base. Heart is mildly enlarged with pulmonary vascularity normal. No adenopathy. Bones are osteoporotic. There is aortic atherosclerosis. IMPRESSION: Suspected pneumonia medial left base. Atelectatic change with questionable developing pneumonia elsewhere in the lung bases. Equivocal pleural effusions bilaterally. There  is a degree of cardiomegaly. Bones osteoporotic. Aortic Atherosclerosis (ICD10-I70.0). Electronically Signed   By: Lowella Grip III M.D.   On: 08/18/2020 16:03    Scheduled Meds: . budesonide (PULMICORT) nebulizer solution  0.5 mg Nebulization BID  . enoxaparin (LOVENOX) injection  40 mg Subcutaneous Q24H  . feeding supplement (GLUCERNA SHAKE)  237 mL Oral TID BM  . ibuprofen  600 mg Oral BID  . insulin aspart  0-5 Units Subcutaneous QHS  . insulin aspart  0-9 Units Subcutaneous TID WC  . ipratropium-albuterol  3 mL Nebulization QID  . NIFEdipine  90 mg Oral Daily  . pantoprazole  40 mg Oral Daily  . pravastatin  20 mg Oral Daily   Continuous Infusions: . ceFEPime (MAXIPIME) IV 2 g (08/20/20 0911)     LOS: 2 days    Time spent: 30 minutes  Barton Dubois, MD [facility]   To contact the attending provider between 7A-7P or the covering provider  during after hours 7P-7A, please log into the web site [email or web address] and access using universal [place] password for that web site. If you do not have the password, please call the hospital operator.  08/20/2020, 3:00 PM

## 2020-08-21 DIAGNOSIS — J189 Pneumonia, unspecified organism: Secondary | ICD-10-CM | POA: Diagnosis not present

## 2020-08-21 DIAGNOSIS — R5381 Other malaise: Secondary | ICD-10-CM

## 2020-08-21 DIAGNOSIS — E1140 Type 2 diabetes mellitus with diabetic neuropathy, unspecified: Secondary | ICD-10-CM | POA: Diagnosis not present

## 2020-08-21 DIAGNOSIS — E876 Hypokalemia: Secondary | ICD-10-CM | POA: Diagnosis not present

## 2020-08-21 DIAGNOSIS — I10 Essential (primary) hypertension: Secondary | ICD-10-CM

## 2020-08-21 LAB — GLUCOSE, CAPILLARY
Glucose-Capillary: 154 mg/dL — ABNORMAL HIGH (ref 70–99)
Glucose-Capillary: 174 mg/dL — ABNORMAL HIGH (ref 70–99)
Glucose-Capillary: 216 mg/dL — ABNORMAL HIGH (ref 70–99)
Glucose-Capillary: 206 mg/dL — ABNORMAL HIGH (ref 70–99)

## 2020-08-21 MED ORDER — AMOXICILLIN-POT CLAVULANATE 250-62.5 MG/5ML PO SUSR
500.0000 mg | Freq: Two times a day (BID) | ORAL | Status: DC
  Administered 2020-08-21 – 2020-08-22 (×2): 500 mg via ORAL
  Filled 2020-08-21 (×6): qty 10

## 2020-08-21 NOTE — Progress Notes (Signed)
PROGRESS NOTE    Robin Mcclure  WCB:[id number] DOB: [birth year] DOA: 08/18/2020 PCP: Lujean Amel, MD   Chief Complaint  Patient presents with  . Shortness of Breath    Brief Narrative:  Past medical history of type 2 diabetes, peripheral neuropathy, hypertension, hyperlipidemia, colon cancer status post colectomy and chemotherapy in remission; with a recent hospitalization secondary to pericarditis/pericardial effusion; who presented to the hospital secondary to worsening shortness of breath, productive coughing spells, generalized weakness, mild hypoxia.  Patient found to be requiring higher level of oxygen supplementation and work-up demonstrating acute pneumonia.  Assessment & Plan: 1-left lower lobe pneumonia -Continue to follow culture results -Antibiotics will be transitioned to oral Augmentin (using suspension) to complete therapy. -Continue oxygen supplementation; continue flutter valve, Pulmicort and DuoNebs. -Follow clinical response. -Will continue added twice a day ibuprofen to assist with symptom management and generalized pain (feeling much better today)..  2-history of pericardial effusion -No significant abnormalities appreciated in cardiac silhouette during initial chest x-ray. -Also Any chest pain or orthopnea symptoms. -Continue telemetry monitoring.  3-hypertension -Stable and well-controlled -Continue nifedipine.  4-hypokalemia -Continue to follow electrolytes and further replete as needed. -Checking magnesium level.  5-gastroesophageal reflux disease -Continue PPI.  6-history of dementia -Continue constant reorientation -Patient high risk for hospital-acquired delirium.  7-type 2 diabetes -Recent A1c 7.3 -Continue sliding scale insulin -Follow CBGs.  8-chronic respiratory failure -Chronically using 2 L nasal cannula supplementation at home -Continue oxygen supplementation -Continue treatment for pneumonia as mentioned above -Continue the use  of flutter valve    DVT prophylaxis: Lovenox Code Status: Full code. Family Communication: Daughter at bedside. Disposition:   Status is: Inpatient  Dispo: The patient is from: Home              Anticipated d/c is to: To be determined              Patient currently is medically stable for discharge to a SNF once bed available; mentation back to baseline.  Is feeling weak, tired and deconditioned.  Breathing has stabilized and her saturation on chronic supplementation appreciated.   Difficult to place patient:  no       Consultants:   None   Procedures:  See below for x-ray reports   Antimicrobials:  Cefepime 08/18/20>>08/21/20 Augmentin   Subjective: Improvement in her breathing; also no complains of generalized pain or chills.  Feeling weak and deconditioned.  Objective: Vitals:   08/21/20 0433 08/21/20 0730 08/21/20 1200 08/21/20 1558  BP: (!) 150/71 138/76    Pulse: 84 81    Resp: 18 18    Temp: 98.4 F (36.9 C) 98.6 F (37 C)    TempSrc: Oral Oral    SpO2: 93% 96% 97% 91%  Weight:      Height:        Intake/Output Summary (Last 24 hours) at 08/21/2020 1716 Last data filed at 08/21/2020 1601 Gross per 24 hour  Intake 520 ml  Output --  Net 520 ml   Filed Weights   08/18/20 1524  Weight: 69.9 kg    Examination: General exam: Alert, awake, oriented x 1; in no acute distress and overall feeling better.  Reports improvement in her generalized pain, chills and breathing status.  Continues to be weak and deconditioned. Respiratory system: No wheezing, positive scattered rhonchi; good oxygen saturation on chronic supplementation. Cardiovascular system:rate controlled. No rubs or gallops.  No JVD. Gastrointestinal system: Abdomen is nondistended, soft and nontender. No organomegaly or masses felt. Normal bowel  sounds heard. Central nervous system: No focal neurological deficits. Extremities: No cyanosis or clubbing. Skin: No petechiae. Psychiatry: Mood &  affect appropriate.    Data Reviewed: I have personally reviewed following labs and imaging studies  CBC: Recent Labs  Lab 08/18/20 1540 08/19/20 0538  WBC 8.9 7.7  NEUTROABS 6.8  --   HGB 9.2* 8.6*  HCT 30.3* 28.9*  MCV 87.1 88.4  PLT 289 944    Basic Metabolic Panel: Recent Labs  Lab 08/18/20 1540 08/18/20 1834 08/19/20 0538  NA 135  --  137  K 2.5*  --  3.6  CL 96*  --  101  CO2 29  --  27  GLUCOSE 217*  --  186*  BUN 15  --  13  CREATININE 0.69  --  0.59  CALCIUM 10.3  --  9.9  MG  --  1.5*  --     GFR: Estimated Creatinine Clearance: 41.3 mL/min (by C-G formula based on SCr of 0.59 mg/dL).  Liver Function Tests: Recent Labs  Lab 08/18/20 1540  AST 13*  ALT 12  ALKPHOS 82  BILITOT 0.7  PROT 7.2  ALBUMIN 3.2*    CBG: Recent Labs  Lab 08/20/20 1624 08/20/20 2011 08/21/20 0734 08/21/20 1117 08/21/20 1607  GLUCAP 178* 156* 154* 174* 216*     Recent Results (from the past 240 hour(s))  Resp Panel by RT-PCR (Flu A&B, Covid) Nasopharyngeal Swab     Status: None   Collection Time: 08/18/20  3:45 PM   Specimen: Nasopharyngeal Swab; Nasopharyngeal(NP) swabs in vial transport medium  Result Value Ref Range Status   SARS Coronavirus 2 by RT PCR NEGATIVE NEGATIVE Final    Comment: (NOTE) SARS-CoV-2 target nucleic acids are NOT DETECTED.  The SARS-CoV-2 RNA is generally detectable in upper respiratory specimens during the acute phase of infection. The lowest concentration of SARS-CoV-2 viral copies this assay can detect is 138 copies/mL. A negative result does not preclude SARS-Cov-2 infection and should not be used as the sole basis for treatment or other patient management decisions. A negative result may occur with  improper specimen collection/handling, submission of specimen other than nasopharyngeal swab, presence of viral mutation(s) within the areas targeted by this assay, and inadequate number of viral copies(<138 copies/mL). A negative  result must be combined with clinical observations, patient history, and epidemiological information. The expected result is Negative.  Fact Sheet for Patients:  EntrepreneurPulse.com.au  Fact Sheet for Healthcare Providers:  IncredibleEmployment.be  This test is no t yet approved or cleared by the [place] FDA and  has been authorized for detection and/or diagnosis of SARS-CoV-2 by FDA under an Emergency Use Authorization (EUA). This EUA will remain  in effect (meaning this test can be used) for the duration of the COVID-19 declaration under Section 564(b)(1) of the Act, 21 U.S.C.section 360bbb-3(b)(1), unless the authorization is terminated  or revoked sooner.       Influenza A by PCR NEGATIVE NEGATIVE Final   Influenza B by PCR NEGATIVE NEGATIVE Final    Comment: (NOTE) The Xpert Xpress SARS-CoV-2/FLU/RSV plus assay is intended as an aid in the diagnosis of influenza from Nasopharyngeal swab specimens and should not be used as a sole basis for treatment. Nasal washings and aspirates are unacceptable for Xpert Xpress SARS-CoV-2/FLU/RSV testing.  Fact Sheet for Patients: EntrepreneurPulse.com.au  Fact Sheet for Healthcare Providers: IncredibleEmployment.be  This test is not yet approved or cleared by the [place] FDA and has been authorized for detection and/or  diagnosis of SARS-CoV-2 by FDA under an Emergency Use Authorization (EUA). This EUA will remain in effect (meaning this test can be used) for the duration of the COVID-19 declaration under Section 564(b)(1) of the Act, 21 U.S.C. section 360bbb-3(b)(1), unless the authorization is terminated or revoked.  Performed at [facility], [address] [place], [place] [address]   Blood culture (routine x 2)     Status: None (Preliminary result)   Collection Time: 08/18/20  6:34 PM   Specimen: Left Antecubital; Blood  Result Value Ref Range  Status   Specimen Description   Final    LEFT ANTECUBITAL Performed at [facility], [address] [place], [place] [address]    Special Requests   Final    BOTTLES DRAWN AEROBIC AND ANAEROBIC Blood Culture adequate volume Performed at [facility], [address] [place], [place] [address]    Culture   Final    NO GROWTH 3 DAYS Performed at [facility], [address], [place], [place] [address]    Report Status PENDING  Incomplete  Blood culture (routine x 2)     Status: None (Preliminary result)   Collection Time: 08/18/20  6:34 PM   Specimen: BLOOD LEFT HAND  Result Value Ref Range Status   Specimen Description   Final    BLOOD LEFT HAND Performed at [facility], [address], [place], [place] [address]    Special Requests   Final    BOTTLES DRAWN AEROBIC AND ANAEROBIC Blood Culture adequate volume Performed at [facility], [address], [place], [place] [address]    Culture   Final    NO GROWTH 3 DAYS Performed at [facility], [address], [place], [place] [address]    Report Status PENDING  Incomplete     Radiology Studies: No results found.  Scheduled Meds: . amoxicillin-clavulanate  500 mg Oral q12n4p  . budesonide (PULMICORT) nebulizer solution  0.5 mg Nebulization BID  . enoxaparin (LOVENOX) injection  40 mg Subcutaneous Q24H  . feeding supplement (GLUCERNA SHAKE)  237 mL Oral TID BM  . ibuprofen  600 mg Oral BID  . insulin aspart  0-5 Units Subcutaneous QHS  . insulin aspart  0-9 Units Subcutaneous TID WC  . ipratropium-albuterol  3 mL Nebulization QID  . NIFEdipine  90 mg Oral Daily  . pantoprazole  40 mg Oral Daily  . pravastatin  20 mg Oral Daily   Continuous Infusions:    LOS: 3 days    Time spent: 30 minutes  Barton Dubois, MD [facility]   To contact the attending provider between 7A-7P or the covering provider during after hours 7P-7A, please log into the web site [email or web address] and access  using universal [place] password for that web site. If you do not have the password, please call the hospital operator.  08/21/2020, 5:16 PM

## 2020-08-21 NOTE — TOC Progression Note (Signed)
Transition of Care [facility]) - Progression Note    Patient Details  Name: Robin Mcclure MRN: [id number] Date of Birth: [birth year]  Transition of [facility] CM/SW Contact  Natasha Bence, LCSW Phone Number: 08/21/2020, 5:37 PM  Clinical Narrative:    CSW followed up with patient's family for HH/SNF. Patient's family now requesting SNF. CSW faxed patient to requested SNFs. TOC to follow.        Expected Discharge Plan and Services                                                 Social Determinants of Health (SDOH) Interventions    Readmission Risk Interventions No flowsheet data found.

## 2020-08-21 NOTE — Plan of Care (Signed)
Alert and oriented x 4. No s/s of pain or discomfprt. Needs 1 person assist with adls. IV abt infused and tolerated well. Calm and pleasant. Continue plan of care.  Problem: Education: Goal: Knowledge of General Education information will improve Description: Including pain rating scale, medication(s)/side effects and non-pharmacologic comfort measures Outcome: Progressing   Problem: Health Behavior/Discharge Planning: Goal: Ability to manage health-related needs will improve Outcome: Progressing   Problem: Clinical Measurements: Goal: Ability to maintain clinical measurements within normal limits will improve Outcome: Progressing Goal: Will remain free from infection Outcome: Progressing Goal: Diagnostic test results will improve Outcome: Progressing Goal: Respiratory complications will improve Outcome: Progressing Goal: Cardiovascular complication will be avoided Outcome: Progressing   Problem: Activity: Goal: Risk for activity intolerance will decrease Outcome: Progressing   Problem: Nutrition: Goal: Adequate nutrition will be maintained Outcome: Progressing   Problem: Coping: Goal: Level of anxiety will decrease Outcome: Progressing   Problem: Elimination: Goal: Will not experience complications related to bowel motility Outcome: Progressing Goal: Will not experience complications related to urinary retention Outcome: Progressing   Problem: Pain Managment: Goal: General experience of comfort will improve Outcome: Progressing   Problem: Safety: Goal: Ability to remain free from injury will improve Outcome: Progressing   Problem: Skin Integrity: Goal: Risk for impaired skin integrity will decrease Outcome: Progressing   Problem: Activity: Goal: Ability to tolerate increased activity will improve Outcome: Progressing   Problem: Clinical Measurements: Goal: Ability to maintain a body temperature in the normal range will improve Outcome: Progressing    Problem: Respiratory: Goal: Ability to maintain adequate ventilation will improve Outcome: Progressing Goal: Ability to maintain a clear airway will improve Outcome: Progressing

## 2020-08-21 NOTE — [place] FL2 (Signed)
[place] MEDICAID FL2 LEVEL OF CARE SCREENING TOOL     IDENTIFICATION  Patient Name: Robin Mcclure Birthdate: December 28, 1922 Sex: female Admission Date (Current Location): 08/18/2020  [facility] and [place] Number:  [employer] and Address:  [facility] [address], [place]      Provider Number: [id number]  Attending Physician Name and Address:  Barton Dubois, MD  Relative Name and Phone Number:  Leota Sauers (Daughter)   [phone]    Current Level of Care: Hospital Recommended Level of Care: [place] Prior Approval Number:    Date Approved/Denied: 08/21/20 PASRR Number: [id number]  Discharge Plan: SNF    Current Diagnoses: Patient Active Problem List   Diagnosis Date Noted  . Pneumonia 08/18/2020  . Diabetes ([place]) 08/18/2020  . Essential hypertension 08/18/2020  . Hypokalemia 08/18/2020  . Pericarditis 06/20/2020  . Pericardial effusion   . Presbycusis of both ears 09/29/2018    Orientation RESPIRATION BLADDER Height & Weight     Self,Time,Situation,Place  Normal Continent Weight: 154 lb (69.9 kg) Height:  5\' 5"  (165.1 cm)  BEHAVIORAL SYMPTOMS/MOOD NEUROLOGICAL BOWEL NUTRITION STATUS      Continent Diet (Diet heart healthy/carb modified Room service appropriate? Yes; Fluid consistency: Thin)  AMBULATORY STATUS COMMUNICATION OF NEEDS Skin   Extensive Assist Verbally Normal                       Personal Care Assistance Level of Assistance  Bathing,Feeding,Dressing Bathing Assistance: Maximum assistance Feeding assistance: Limited assistance Dressing Assistance: Maximum assistance     Functional Limitations Info  Sight,Hearing,Speech Sight Info: Impaired Hearing Info: Impaired Speech Info: Adequate    SPECIAL CARE FACTORS FREQUENCY  PT (By licensed PT)     PT Frequency: 5x              Contractures Contractures Info: Not present    Additional Factors Info  Code Status,Insulin  Sliding Scale,Allergies Code Status Info: Full Allergies Info: N/A   Insulin Sliding Scale Info: CBG < 70: Implement Hypoglycemia Standing Orders and refer to Hypoglycemia Standing Orders sidebar report   CBG 70 - 120: 0 units   CBG 121 - 150: 0 units   CBG 151 - 200: 0 units   CBG 201 - 250: 2 units   CBG 251 - 300: 3 units   CBG 301 - 350: 4 units   CBG 351 - 400: 5 units   CBG > 400 call MD and obtain STAT lab verification       Current Medications (08/21/2020):  This is the current hospital active medication list Current Facility-Administered Medications  Medication Dose Route Frequency Provider Last Rate Last Admin  . budesonide (PULMICORT) nebulizer solution 0.5 mg  0.5 mg Nebulization BID Barton Dubois, MD   0.5 mg at 08/21/20 0859  . ceFEPIme (MAXIPIME) 2 g in sodium chloride 0.9 % 100 mL IVPB  2 g Intravenous Q12H Barton Dubois, MD 200 mL/hr at 08/21/20 0850 2 g at 08/21/20 0850  . enoxaparin (LOVENOX) injection 40 mg  40 mg Subcutaneous Q24H Elgergawy, Silver Huguenin, MD   40 mg at 08/20/20 2117  . feeding supplement (GLUCERNA SHAKE) (GLUCERNA SHAKE) liquid 237 mL  237 mL Oral TID BM Barton Dubois, MD   237 mL at 08/21/20 0859  . guaiFENesin-dextromethorphan (ROBITUSSIN DM) 100-10 MG/5ML syrup 5 mL  5 mL Oral Q4H PRN Elgergawy, Silver Huguenin, MD   5 mL at 08/21/20 0850  . ibuprofen (ADVIL) tablet 600  mg  600 mg Oral BID Barton Dubois, MD   600 mg at 08/21/20 0851  . insulin aspart (novoLOG) injection 0-5 Units  0-5 Units Subcutaneous QHS Elgergawy, Silver Huguenin, MD   3 Units at 08/19/20 2209  . insulin aspart (novoLOG) injection 0-9 Units  0-9 Units Subcutaneous TID WC Elgergawy, Silver Huguenin, MD   2 Units at 08/21/20 [phone]  . ipratropium-albuterol (DUONEB) 0.5-2.5 (3) MG/3ML nebulizer solution 3 mL  3 mL Nebulization QID Barton Dubois, MD   3 mL at 08/21/20 0859  . NIFEdipine (PROCARDIA-XL/NIFEDICAL-XL) 24 hr tablet 90 mg  90 mg Oral Daily Elgergawy, Silver Huguenin, MD   90 mg at 08/21/20 0851  .  pantoprazole (PROTONIX) EC tablet 40 mg  40 mg Oral Daily Elgergawy, Silver Huguenin, MD   40 mg at 08/21/20 0851  . pravastatin (PRAVACHOL) tablet 20 mg  20 mg Oral Daily Elgergawy, Silver Huguenin, MD   20 mg at 08/21/20 4982     Discharge Medications: Please see discharge summary for a list of discharge medications.  Relevant Imaging Results:  Relevant Lab Results:   Additional Information Pt SSN [id number]  Natasha Bence, LCSW

## 2020-08-22 DIAGNOSIS — F0390 Unspecified dementia without behavioral disturbance: Secondary | ICD-10-CM

## 2020-08-22 DIAGNOSIS — I10 Essential (primary) hypertension: Secondary | ICD-10-CM | POA: Diagnosis not present

## 2020-08-22 DIAGNOSIS — E876 Hypokalemia: Secondary | ICD-10-CM | POA: Diagnosis not present

## 2020-08-22 DIAGNOSIS — J189 Pneumonia, unspecified organism: Secondary | ICD-10-CM | POA: Diagnosis not present

## 2020-08-22 DIAGNOSIS — E1140 Type 2 diabetes mellitus with diabetic neuropathy, unspecified: Secondary | ICD-10-CM | POA: Diagnosis not present

## 2020-08-22 DIAGNOSIS — K219 Gastro-esophageal reflux disease without esophagitis: Secondary | ICD-10-CM

## 2020-08-22 LAB — GLUCOSE, CAPILLARY
Glucose-Capillary: 157 mg/dL — ABNORMAL HIGH (ref 70–99)
Glucose-Capillary: 169 mg/dL — ABNORMAL HIGH (ref 70–99)
Glucose-Capillary: 189 mg/dL — ABNORMAL HIGH (ref 70–99)

## 2020-08-22 MED ORDER — AMOXICILLIN-POT CLAVULANATE 250-62.5 MG/5ML PO SUSR: 500.0000 mg | Freq: Two times a day (BID) | ORAL | 0 refills | Status: AC

## 2020-08-22 MED ORDER — IBUPROFEN 600 MG PO TABS: 600.0000 mg | ORAL_TABLET | Freq: Two times a day (BID) | ORAL | Status: DC

## 2020-08-22 MED ORDER — SUCRALFATE 1 GM/10ML PO SUSP: 1.0000 g | Freq: Two times a day (BID) | ORAL | Status: DC

## 2020-08-22 MED ORDER — DEXTROMETHORPHAN POLISTIREX ER 30 MG/5ML PO SUER: 15.0000 mg | Freq: Four times a day (QID) | ORAL | 0 refills | Status: DC | PRN

## 2020-08-22 NOTE — Plan of Care (Signed)
Alert and oriented x 3. Denies pain or discomfort. Continues on oral abt. Continues on telemetry. Needs 1 person assist with adls.  Problem: Education: Goal: Knowledge of General Education information will improve Description: Including pain rating scale, medication(s)/side effects and non-pharmacologic comfort measures Outcome: Progressing   Problem: Health Behavior/Discharge Planning: Goal: Ability to manage health-related needs will improve Outcome: Progressing   Problem: Clinical Measurements: Goal: Ability to maintain clinical measurements within normal limits will improve Outcome: Progressing Goal: Will remain free from infection Outcome: Progressing Goal: Diagnostic test results will improve Outcome: Progressing Goal: Respiratory complications will improve Outcome: Progressing Goal: Cardiovascular complication will be avoided Outcome: Progressing   Problem: Activity: Goal: Risk for activity intolerance will decrease Outcome: Progressing   Problem: Nutrition: Goal: Adequate nutrition will be maintained Outcome: Progressing   Problem: Coping: Goal: Level of anxiety will decrease Outcome: Progressing   Problem: Elimination: Goal: Will not experience complications related to bowel motility Outcome: Progressing Goal: Will not experience complications related to urinary retention Outcome: Progressing   Problem: Pain Managment: Goal: General experience of comfort will improve Outcome: Progressing   Problem: Safety: Goal: Ability to remain free from injury will improve Outcome: Progressing   Problem: Skin Integrity: Goal: Risk for impaired skin integrity will decrease Outcome: Progressing   Problem: Activity: Goal: Ability to tolerate increased activity will improve Outcome: Progressing   Problem: Clinical Measurements: Goal: Ability to maintain a body temperature in the normal range will improve Outcome: Progressing   Problem: Respiratory: Goal: Ability to  maintain adequate ventilation will improve Outcome: Progressing Goal: Ability to maintain a clear airway will improve Outcome: Progressing

## 2020-08-22 NOTE — Discharge Summary (Signed)
Physician Discharge Summary  Robin Mcclure ZOX:[id number] DOB: [birth year] DOA: 08/18/2020  PCP: Lujean Amel, MD  Admit date: 08/18/2020 Discharge date: 08/22/2020  Time spent: 35 minutes  Recommendations for Outpatient Follow-up:  1. Repeat DEXA metabolic panel electrolytes and renal function 2. Repeat chest x-ray in 4-6 weeks to assure complete resolution of infiltrates noted   Discharge Diagnoses:  Active Problems:   Healthcare-associated pneumonia   Diabetes ([place])   Essential hypertension   Hypokalemia   Gastroesophageal reflux disease   Dementia without behavioral disturbance ([place])   Discharge Condition: Stable and improved.  Patient discharged home with instruction to follow-up with PCP in 2 weeks.  CODE STATUS: Full code.  Diet recommendation: Heart healthy carbohydrates.  Filed Weights   08/18/20 1524  Weight: 69.9 kg    Brief history of present illness:   Past medical history of type 2 diabetes, peripheral neuropathy, hypertension, hyperlipidemia, colon cancer status post colectomy and chemotherapy in remission; with a recent hospitalization secondary to pericarditis/pericardial effusion; who presented to the hospital secondary to worsening shortness of breath, productive coughing spells, generalized weakness, mild hypoxia.  Patient found to be requiring higher level of oxygen supplementation and work-up demonstrating acute pneumonia.  Hospital Course:  1-left lower lobe pneumonia (aspiration vs HCAP) -Cultures without growth. -Antibiotics has been transitioned to oral Augmentin (using suspension) to complete therapy. -Continue oxygen supplementation; continue flutter valve, Pulmicort and DuoNebs. -Follow clinical response. -Will continue added twice a day ibuprofen to assist with symptom management and generalized pain. -given hx of dementia and dysphagia, aspiration component is part of the culprit concerns.   2-history of pericardial effusion -No significant  abnormalities appreciated in cardiac silhouette during initial chest x-ray. -Also patient denies any chest pain or orthopnea symptoms currently.  3-hypertension -Stable and well-controlled -Continue home antihypertensive regimen -Heart healthy diet encouraged.  4-hypokalemia -Continue to follow electrolytes and further replete as needed. -Magnesium within normal limits -Potassium stable and within normal limits at discharge.  5-gastroesophageal reflux disease -Continue PPI and Carafate.  6-history of dementia/dysphagia -Continue constant reorientation and supportive care. -continue Speech therapy as an outpatient   7-type 2 diabetes -Recent A1c 7.3 -Continue to follow CBGs -Resume home hypoglycemic regimen. -Patient advised to watch amount of carbohydrates in her diet  8-chronic respiratory failure -Chronically using 2-4 L nasal cannula supplementation at home -Continue oxygen supplementation -Continue treatment for pneumonia as mentioned above. -Continue the use of flutter valve and incentive spirometer.  Procedures:  See below for x-ray reports.  Consultations:  None  Discharge Exam: Vitals:   08/22/20 0751 08/22/20 1405  BP:  (!) 148/77  Pulse:  86  Resp:  17  Temp:  98.1 F (36.7 C)  SpO2: 96% 96%    General: Afebrile, no chest pain, no nausea, no vomiting.  Reported improvement in her breathing with good oxygen saturation on chronic supplementation (2 L nasal cannula; patient uses between 2 on 4 L at home). Cardiovascular: S1 and S2, no rubs, no gallops, no JVD. Respiratory: No using accessory muscle.  Positive scattered rhonchi, no wheezing, no crackles, no appreciated tachypnea. Abdomen: Soft, nontender, nondistended, positive bowel sounds. Extremities: No cyanosis or clubbing.  Discharge Instructions   Discharge Instructions    Diet - low sodium heart healthy   Complete by: As directed    Discharge instructions   Complete by: As directed     Take medications as prescribed Maintain adequate hydration Continue to use oxygen supplementation up to 4 L to maintain saturation above 90% Continue the  use of inhaler management as instructed -Continue to use flutter valve and  incentive spirometer Follow recommendations by home health services to continue assisting with patient's rehabilitation and conditioning.   Increase activity slowly   Complete by: As directed      Allergies as of 08/22/2020   No Known Allergies     Medication List    TAKE these medications   acetaminophen 325 MG tablet Commonly known as: TYLENOL Take 650 mg by mouth every 6 (six) hours as needed for mild pain.   albuterol 108 (90 Base) MCG/ACT inhaler Commonly known as: VENTOLIN HFA Inhale 1-2 puffs into the lungs every 6 (six) hours as needed for wheezing or shortness of breath.   amoxicillin-clavulanate 250-62.5 MG/5ML suspension Commonly known as: AUGMENTIN Take 10 mLs (500 mg total) by mouth 2 (two) times daily for 4 days.   B COMPLEX PO Take 1 tablet by mouth daily.   CALCIUM/MAGNESIUM/ZINC PO Take 1 tablet by mouth 2 (two) times daily.   Cholecalciferol 25 MCG (1000 UT) tablet Take 1,000 Units by mouth daily.   [employer] SPECTRAVITE SENIOR PO Take 1 tablet by mouth daily.   dextromethorphan 30 MG/5ML liquid Commonly known as: DELSYM Take 2.5 mLs (15 mg total) by mouth every 6 (six) hours as needed for cough.   Fluticasone-Salmeterol 500-50 MCG/DOSE Aepb Commonly known as: ADVAIR Inhale 1 puff into the lungs 2 (two) times daily.   glipiZIDE 2.5 MG 24 hr tablet Commonly known as: GLUCOTROL XL Take 2.5 mg by mouth daily with breakfast.   ibuprofen 600 MG tablet Commonly known as: ADVIL Take 1 tablet (600 mg total) by mouth every 12 (twelve) hours. What changed: when to take this   NIFEdipine 90 MG 24 hr tablet Commonly known as: ADALAT CC Take 90 mg by mouth every morning.   ondansetron 4 MG tablet Commonly known as: ZOFRAN Take  4 mg by mouth every 8 (eight) hours as needed for nausea.   OXYGEN Inhale 4 L into the lungs daily.   pantoprazole 40 MG tablet Commonly known as: PROTONIX Take 40 mg by mouth every morning.   pravastatin 20 MG tablet Commonly known as: PRAVACHOL Take 20 mg by mouth every morning.   sitaGLIPtin 50 MG tablet Commonly known as: Januvia Take 1 tablet (50 mg total) by mouth daily. What changed: when to take this   sucralfate 1 GM/10ML suspension Commonly known as: Carafate Take 10 mLs (1 g total) by mouth 2 (two) times daily.      No Known Allergies  Follow-up Information    [facility] Follow up.   Specialty: Home Health Services Why:  RN/PT/OT/Speech/Aide Contact information: [place] [address] [phone]        [name], [name], MD. Schedule an appointment as soon as possible for a visit in 10 day(s).   Specialty: Family Medicine Contact information: [place] [address] [phone]                The results of significant diagnostics from this hospitalization (including imaging, microbiology, ancillary and laboratory) are listed below for reference.    Significant Diagnostic Studies: DG Chest Port 1 View  Result Date: 08/18/2020 CLINICAL DATA:  Shortness of breath with decreased oxygen saturation EXAM: PORTABLE CHEST 1 VIEW COMPARISON:  June 20, 2020 chest radiograph and chest CT FINDINGS: There is ill-defined airspace opacity in each lung base with equivocal pleural effusions bilaterally. There is consolidation more medially  in the left base. Heart is mildly enlarged with pulmonary vascularity normal. No adenopathy. Bones are osteoporotic. There is aortic atherosclerosis. IMPRESSION: Suspected pneumonia medial left base. Atelectatic change with questionable developing pneumonia elsewhere in the lung bases. Equivocal pleural effusions bilaterally. There is a degree of  cardiomegaly. Bones osteoporotic. Aortic Atherosclerosis (ICD10-I70.0). Electronically Signed   By: Lowella Grip III M.D.   On: 08/18/2020 16:03    Microbiology: Recent Results (from the past 240 hour(s))  Resp Panel by RT-PCR (Flu A&B, Covid) Nasopharyngeal Swab     Status: None   Collection Time: 08/18/20  3:45 PM   Specimen: Nasopharyngeal Swab; Nasopharyngeal(NP) swabs in vial transport medium  Result Value Ref Range Status   SARS Coronavirus 2 by RT PCR NEGATIVE NEGATIVE Final    Comment: (NOTE) SARS-CoV-2 target nucleic acids are NOT DETECTED.  The SARS-CoV-2 RNA is generally detectable in upper respiratory specimens during the acute phase of infection. The lowest concentration of SARS-CoV-2 viral copies this assay can detect is 138 copies/mL. A negative result does not preclude SARS-Cov-2 infection and should not be used as the sole basis for treatment or other patient management decisions. A negative result may occur with  improper specimen collection/handling, submission of specimen other than nasopharyngeal swab, presence of viral mutation(s) within the areas targeted by this assay, and inadequate number of viral copies(<138 copies/mL). A negative result must be combined with clinical observations, patient history, and epidemiological information. The expected result is Negative.  Fact Sheet for Patients:  EntrepreneurPulse.com.au  Fact Sheet for Healthcare Providers:  IncredibleEmployment.be  This test is no t yet approved or cleared by the [place] FDA and  has been authorized for detection and/or diagnosis of SARS-CoV-2 by FDA under an Emergency Use Authorization (EUA). This EUA will remain  in effect (meaning this test can be used) for the duration of the COVID-19 declaration under Section 564(b)(1) of the Act, 21 U.S.C.section 360bbb-3(b)(1), unless the authorization is terminated  or revoked sooner.       Influenza  A by PCR NEGATIVE NEGATIVE Final   Influenza B by PCR NEGATIVE NEGATIVE Final    Comment: (NOTE) The Xpert Xpress SARS-CoV-2/FLU/RSV plus assay is intended as an aid in the diagnosis of influenza from Nasopharyngeal swab specimens and should not be used as a sole basis for treatment. Nasal washings and aspirates are unacceptable for Xpert Xpress SARS-CoV-2/FLU/RSV testing.  Fact Sheet for Patients: EntrepreneurPulse.com.au  Fact Sheet for Healthcare Providers: IncredibleEmployment.be  This test is not yet approved or cleared by the [place] FDA and has been authorized for detection and/or diagnosis of SARS-CoV-2 by FDA under an Emergency Use Authorization (EUA). This EUA will remain in effect (meaning this test can be used) for the duration of the COVID-19 declaration under Section 564(b)(1) of the Act, 21 U.S.C. section 360bbb-3(b)(1), unless the authorization is terminated or revoked.  Performed at [facility], [address] [place], [place] [address]   Blood culture (routine x 2)     Status: None (Preliminary result)   Collection Time: 08/18/20  6:34 PM   Specimen: Left Antecubital; Blood  Result Value Ref Range Status   Specimen Description   Final    LEFT ANTECUBITAL Performed at [facility], [address], [place], [place] [address]    Special Requests   Final    BOTTLES DRAWN AEROBIC AND ANAEROBIC Blood Culture adequate volume Performed at [facility], [address], [place], [place] [address]    Culture  Final    NO GROWTH 4 DAYS Performed at [facility], [address], [place], [place] [address]    Report Status PENDING  Incomplete  Blood culture (routine x 2)     Status: None (Preliminary result)   Collection Time: 08/18/20  6:34 PM   Specimen: BLOOD LEFT HAND  Result Value Ref Range Status   Specimen Description   Final    BLOOD LEFT HAND Performed at [facility], [address]  [place], [place] [address]    Special Requests   Final    BOTTLES DRAWN AEROBIC AND ANAEROBIC Blood Culture adequate volume Performed at [facility], [address] [place], [place] [address]    Culture   Final    NO GROWTH 4 DAYS Performed at [facility], [address] [place], [place] [address]    Report Status PENDING  Incomplete     Labs: Basic Metabolic Panel: Recent Labs  Lab 08/18/20 1540 08/18/20 1834 08/19/20 0538  NA 135  --  137  K 2.5*  --  3.6  CL 96*  --  101  CO2 29  --  27  GLUCOSE 217*  --  186*  BUN 15  --  13  CREATININE 0.69  --  0.59  CALCIUM 10.3  --  9.9  MG  --  1.5*  --    Liver Function Tests: Recent Labs  Lab 08/18/20 1540  AST 13*  ALT 12  ALKPHOS 82  BILITOT 0.7  PROT 7.2  ALBUMIN 3.2*   CBC: Recent Labs  Lab 08/18/20 1540 08/19/20 0538  WBC 8.9 7.7  NEUTROABS 6.8  --   HGB 9.2* 8.6*  HCT 30.3* 28.9*  MCV 87.1 88.4  PLT 289 257   BNP (last 3 results) Recent Labs    06/20/20 1030  BNP 277.0*   CBG: Recent Labs  Lab 08/21/20 1607 08/21/20 2204 08/22/20 0717 08/22/20 1128 08/22/20 1625  GLUCAP 216* 206* 157* 169* 189*    Signed:  Barton Dubois MD.  [facility] 08/22/2020, 4:57 PM

## 2020-08-22 NOTE — Progress Notes (Signed)
Physical Therapy Treatment Patient Details Name: Robin Mcclure MRN: [id number] DOB: 1922/10/26 Today's Date: 08/22/2020    History of Present Illness Robin Mcclure  is a 87 y.o. female,with a history of T2DM with peripheral neuropathy, HTN, HLD, colon CA s/p colectomy and chemotherapy in remission, patient with recent hospitalization last month related to pericarditis/pericardial effusion, which seems to be improved by time of discharge, where she was discharged on colchicine and ibuprofen.  -Patient presents to ED secondary to complaints of worsening shortness of breath, cough with yellow productive phlegm, patient reports generalized weakness, fatigue, worsening dyspnea, but reports she maintains at baseline 2 L nasal cannula, reports productive cough with yellow phlegm, she denies fever, chills, chest pain, nausea or vomiting, he denies any orthopnea, lower extremity edema or leg pain.  - in ED her chest x-ray was significant for left medial base opacity suspicious for pneumonia, she was afebrile, she is at her baseline on 2 L nasal cannula, work-up significant for hypokalemia potassium of 2.5, [facility] hospitalist consulted to admit.    PT Comments    Present in room with family and agreeable to PT. Good completion of transfers throughout session, only requiring assist out of chair secondary to increased depth. Demo improved ambulation distance vs previous sessions, but cont difficulty with SOB and fatigue. Able to transfer to bed with supervision/Min guard allowing for improved independence at home with family. Nurse tech notified of position and how patient did in therapy. Patient will benefit from continued physical therapy in hospital and recommended venue below to increase strength, balance, endurance for safe ADLs and gait.    Follow Up Recommendations  SNF;Supervision for mobility/OOB;Supervision - Intermittent     Equipment Recommendations  None recommended by PT    Recommendations for  Other Services       Precautions / Restrictions Precautions Precautions: Fall Restrictions Weight Bearing Restrictions: No    Mobility  Bed Mobility   Bed Mobility: Sit to Supine       Sit to supine: Supervision   General bed mobility comments: increased time, HOB elevated    Transfers Overall transfer level: Needs assistance Equipment used: Rolling walker (2 wheeled) Transfers: Sit to/from [facility] Sit to Stand: Min assist Stand pivot transfers: Min guard       General transfer comment: slow labored movement  Ambulation/Gait Ambulation/Gait assistance: Min assist Gait Distance (Feet): 25 Feet Assistive device: Rolling walker (2 wheeled) Gait Pattern/deviations: Decreased step length - right;Decreased step length - left;Decreased stride length Gait velocity: decreased   General Gait Details: slow and labored, out into hallway   Stairs             Wheelchair Mobility    Modified Rankin (Stroke Patients Only)       Balance Overall balance assessment: Needs assistance Sitting-balance support: Feet supported;No upper extremity supported Sitting balance-Leahy Scale: Fair Sitting balance - Comments: fair/good seated at EOB   Standing balance support: During functional activity;Bilateral upper extremity supported Standing balance-Leahy Scale: Fair Standing balance comment: fair using RW                            Cognition Arousal/Alertness: Awake/alert Behavior During Therapy: WFL for tasks assessed/performed Overall Cognitive Status: Within Functional Limits for tasks assessed  Exercises      General Comments        Pertinent Vitals/Pain Pain Assessment: No/denies pain    Home Living                      Prior Function            PT Goals (current goals can now be found in the care plan section) Acute Rehab PT Goals Patient Stated Goal:  return home with family to assist PT Goal Formulation: With patient Time For Goal Achievement: 09/02/20 Potential to Achieve Goals: Good Progress towards PT goals: Progressing toward goals    Frequency    Min 3X/week      PT Plan Current plan remains appropriate    Co-evaluation              AM-PAC PT "6 Clicks" Mobility   Outcome Measure  Help needed turning from your back to your side while in a flat bed without using bedrails?: A Little Help needed moving from lying on your back to sitting on the side of a flat bed without using bedrails?: A Little Help needed moving to and from a bed to a chair (including a wheelchair)?: A Little Help needed standing up from a chair using your arms (e.g., wheelchair or bedside chair)?: A Little Help needed to walk in hospital room?: A Lot Help needed climbing 3-5 steps with a railing? : A Lot 6 Click Score: 16    End of Session Equipment Utilized During Treatment: Oxygen Activity Tolerance: Patient tolerated treatment well;Patient limited by fatigue Patient left: with call bell/phone within reach;in bed;with family/visitor present;with bed alarm set Nurse Communication: Mobility status PT Visit Diagnosis: Unsteadiness on feet (R26.81);Other abnormalities of gait and mobility (R26.89);Muscle weakness (generalized) (M62.81)     Time: 8453-6468 PT Time Calculation (min) (ACUTE ONLY): 16 min  Charges:  $Gait Training: 8-22 mins                     4:42 PM,08/22/20 Domenic Moras, PT, DPT Physical Therapist at [facility]

## 2020-08-22 NOTE — Care Management Important Message (Signed)
Important Message  Patient Details  Name: Robin Mcclure MRN: [id number] Date of Birth: 09-10-1922   Medicare Important Message Given:  Yes     [name] 08/22/2020, 1:54 PM

## 2020-08-22 NOTE — TOC Transition Note (Signed)
Transition of Care [facility]) - CM/SW Discharge Note   Patient Details  Name: Robin Mcclure MRN: [id number] Date of Birth: [birth year]  Transition of Care [facility]) CM/SW Contact:  Boneta Lucks, RN Phone Number: 08/22/2020, 1:45 PM   Clinical Narrative:   TOC called Daughter about bed offers. Family has decided to take patient home.  She is active with [facility]. Georgina Snell updated. MD will placed orders.  PT/OT/RN/Speech/Aide.    Final next level of care: [place] Barriers to Discharge: Barriers Resolved   Patient Goals and CMS Choice Patient states their goals for this hospitalization and ongoing recovery are:: to go home. CMS Medicare.gov Compare Post Acute Care list provided to:: Patient Represenative (must comment) Choice offered to / list presented to : Adult Children  Discharge Placement              Patient chooses bed at:  [facility])   Name of family member notified: [name] Patient and family notified of of transfer: 08/22/20  Discharge Plan and Services      Date [place]: 08/22/20 Time [place]: 1344 Representative spoke with at [place]: Georgina Snell  Readmission Risk Interventions Readmission Risk Prevention Plan 08/22/2020  Medication Screening Complete  Transportation Screening Complete  Some recent data might be hidden

## 2020-08-23 DIAGNOSIS — I2510 Atherosclerotic heart disease of native coronary artery without angina pectoris: Secondary | ICD-10-CM | POA: Diagnosis not present

## 2020-08-23 DIAGNOSIS — N1832 Chronic kidney disease, stage 3b: Secondary | ICD-10-CM | POA: Diagnosis not present

## 2020-08-23 DIAGNOSIS — E1122 Type 2 diabetes mellitus with diabetic chronic kidney disease: Secondary | ICD-10-CM | POA: Diagnosis not present

## 2020-08-23 DIAGNOSIS — E1142 Type 2 diabetes mellitus with diabetic polyneuropathy: Secondary | ICD-10-CM | POA: Diagnosis not present

## 2020-08-23 DIAGNOSIS — I313 Pericardial effusion (noninflammatory): Secondary | ICD-10-CM | POA: Diagnosis not present

## 2020-08-23 DIAGNOSIS — I1310 Hypertensive heart and chronic kidney disease without heart failure, with stage 1 through stage 4 chronic kidney disease, or unspecified chronic kidney disease: Secondary | ICD-10-CM | POA: Diagnosis not present

## 2020-08-23 LAB — CULTURE, BLOOD (ROUTINE X 2)
Culture: NO GROWTH
Culture: NO GROWTH
Special Requests: ADEQUATE
Special Requests: ADEQUATE

## 2020-08-24 DIAGNOSIS — M810 Age-related osteoporosis without current pathological fracture: Secondary | ICD-10-CM | POA: Diagnosis not present

## 2020-08-24 DIAGNOSIS — K219 Gastro-esophageal reflux disease without esophagitis: Secondary | ICD-10-CM | POA: Diagnosis not present

## 2020-08-24 DIAGNOSIS — Z87891 Personal history of nicotine dependence: Secondary | ICD-10-CM | POA: Diagnosis not present

## 2020-08-24 DIAGNOSIS — Z791 Long term (current) use of non-steroidal anti-inflammatories (NSAID): Secondary | ICD-10-CM | POA: Diagnosis not present

## 2020-08-24 DIAGNOSIS — E876 Hypokalemia: Secondary | ICD-10-CM | POA: Diagnosis not present

## 2020-08-24 DIAGNOSIS — E785 Hyperlipidemia, unspecified: Secondary | ICD-10-CM | POA: Diagnosis not present

## 2020-08-24 DIAGNOSIS — Z7951 Long term (current) use of inhaled steroids: Secondary | ICD-10-CM | POA: Diagnosis not present

## 2020-08-24 DIAGNOSIS — I700 Atherosclerosis of aorta: Secondary | ICD-10-CM | POA: Diagnosis not present

## 2020-08-24 DIAGNOSIS — E1122 Type 2 diabetes mellitus with diabetic chronic kidney disease: Secondary | ICD-10-CM | POA: Diagnosis not present

## 2020-08-24 DIAGNOSIS — I2510 Atherosclerotic heart disease of native coronary artery without angina pectoris: Secondary | ICD-10-CM | POA: Diagnosis not present

## 2020-08-24 DIAGNOSIS — Z9181 History of falling: Secondary | ICD-10-CM | POA: Diagnosis not present

## 2020-08-24 DIAGNOSIS — R1310 Dysphagia, unspecified: Secondary | ICD-10-CM | POA: Diagnosis not present

## 2020-08-24 DIAGNOSIS — E1142 Type 2 diabetes mellitus with diabetic polyneuropathy: Secondary | ICD-10-CM | POA: Diagnosis not present

## 2020-08-24 DIAGNOSIS — N1832 Chronic kidney disease, stage 3b: Secondary | ICD-10-CM | POA: Diagnosis not present

## 2020-08-24 DIAGNOSIS — Z85038 Personal history of other malignant neoplasm of large intestine: Secondary | ICD-10-CM | POA: Diagnosis not present

## 2020-08-24 DIAGNOSIS — Z9981 Dependence on supplemental oxygen: Secondary | ICD-10-CM | POA: Diagnosis not present

## 2020-08-24 DIAGNOSIS — F0390 Unspecified dementia without behavioral disturbance: Secondary | ICD-10-CM | POA: Diagnosis not present

## 2020-08-24 DIAGNOSIS — Z7984 Long term (current) use of oral hypoglycemic drugs: Secondary | ICD-10-CM | POA: Diagnosis not present

## 2020-08-24 DIAGNOSIS — I2721 Secondary pulmonary arterial hypertension: Secondary | ICD-10-CM | POA: Diagnosis not present

## 2020-08-24 DIAGNOSIS — J181 Lobar pneumonia, unspecified organism: Secondary | ICD-10-CM | POA: Diagnosis not present

## 2020-08-24 DIAGNOSIS — I088 Other rheumatic multiple valve diseases: Secondary | ICD-10-CM | POA: Diagnosis not present

## 2020-08-24 DIAGNOSIS — J9611 Chronic respiratory failure with hypoxia: Secondary | ICD-10-CM | POA: Diagnosis not present

## 2020-08-24 DIAGNOSIS — I1310 Hypertensive heart and chronic kidney disease without heart failure, with stage 1 through stage 4 chronic kidney disease, or unspecified chronic kidney disease: Secondary | ICD-10-CM | POA: Diagnosis not present

## 2020-08-25 DIAGNOSIS — I2510 Atherosclerotic heart disease of native coronary artery without angina pectoris: Secondary | ICD-10-CM | POA: Diagnosis not present

## 2020-08-25 DIAGNOSIS — E1142 Type 2 diabetes mellitus with diabetic polyneuropathy: Secondary | ICD-10-CM | POA: Diagnosis not present

## 2020-08-25 DIAGNOSIS — I1310 Hypertensive heart and chronic kidney disease without heart failure, with stage 1 through stage 4 chronic kidney disease, or unspecified chronic kidney disease: Secondary | ICD-10-CM | POA: Diagnosis not present

## 2020-08-25 DIAGNOSIS — N1832 Chronic kidney disease, stage 3b: Secondary | ICD-10-CM | POA: Diagnosis not present

## 2020-08-25 DIAGNOSIS — E1122 Type 2 diabetes mellitus with diabetic chronic kidney disease: Secondary | ICD-10-CM | POA: Diagnosis not present

## 2020-08-25 DIAGNOSIS — J181 Lobar pneumonia, unspecified organism: Secondary | ICD-10-CM | POA: Diagnosis not present

## 2020-08-29 DIAGNOSIS — I1310 Hypertensive heart and chronic kidney disease without heart failure, with stage 1 through stage 4 chronic kidney disease, or unspecified chronic kidney disease: Secondary | ICD-10-CM | POA: Diagnosis not present

## 2020-08-29 DIAGNOSIS — E1122 Type 2 diabetes mellitus with diabetic chronic kidney disease: Secondary | ICD-10-CM | POA: Diagnosis not present

## 2020-08-29 DIAGNOSIS — J181 Lobar pneumonia, unspecified organism: Secondary | ICD-10-CM | POA: Diagnosis not present

## 2020-08-29 DIAGNOSIS — N1832 Chronic kidney disease, stage 3b: Secondary | ICD-10-CM | POA: Diagnosis not present

## 2020-08-29 DIAGNOSIS — I2510 Atherosclerotic heart disease of native coronary artery without angina pectoris: Secondary | ICD-10-CM | POA: Diagnosis not present

## 2020-08-29 DIAGNOSIS — E1142 Type 2 diabetes mellitus with diabetic polyneuropathy: Secondary | ICD-10-CM | POA: Diagnosis not present

## 2020-08-30 DIAGNOSIS — E1142 Type 2 diabetes mellitus with diabetic polyneuropathy: Secondary | ICD-10-CM | POA: Diagnosis not present

## 2020-08-30 DIAGNOSIS — J181 Lobar pneumonia, unspecified organism: Secondary | ICD-10-CM | POA: Diagnosis not present

## 2020-08-30 DIAGNOSIS — F419 Anxiety disorder, unspecified: Secondary | ICD-10-CM | POA: Diagnosis not present

## 2020-08-30 DIAGNOSIS — E1122 Type 2 diabetes mellitus with diabetic chronic kidney disease: Secondary | ICD-10-CM | POA: Diagnosis not present

## 2020-08-30 DIAGNOSIS — N1832 Chronic kidney disease, stage 3b: Secondary | ICD-10-CM | POA: Diagnosis not present

## 2020-08-30 DIAGNOSIS — I2510 Atherosclerotic heart disease of native coronary artery without angina pectoris: Secondary | ICD-10-CM | POA: Diagnosis not present

## 2020-08-30 DIAGNOSIS — I1310 Hypertensive heart and chronic kidney disease without heart failure, with stage 1 through stage 4 chronic kidney disease, or unspecified chronic kidney disease: Secondary | ICD-10-CM | POA: Diagnosis not present

## 2020-08-30 DIAGNOSIS — J690 Pneumonitis due to inhalation of food and vomit: Secondary | ICD-10-CM | POA: Diagnosis not present

## 2020-08-31 DIAGNOSIS — E1122 Type 2 diabetes mellitus with diabetic chronic kidney disease: Secondary | ICD-10-CM | POA: Diagnosis not present

## 2020-08-31 DIAGNOSIS — E1142 Type 2 diabetes mellitus with diabetic polyneuropathy: Secondary | ICD-10-CM | POA: Diagnosis not present

## 2020-08-31 DIAGNOSIS — I2510 Atherosclerotic heart disease of native coronary artery without angina pectoris: Secondary | ICD-10-CM | POA: Diagnosis not present

## 2020-08-31 DIAGNOSIS — N1832 Chronic kidney disease, stage 3b: Secondary | ICD-10-CM | POA: Diagnosis not present

## 2020-08-31 DIAGNOSIS — J181 Lobar pneumonia, unspecified organism: Secondary | ICD-10-CM | POA: Diagnosis not present

## 2020-08-31 DIAGNOSIS — I1310 Hypertensive heart and chronic kidney disease without heart failure, with stage 1 through stage 4 chronic kidney disease, or unspecified chronic kidney disease: Secondary | ICD-10-CM | POA: Diagnosis not present

## 2020-09-01 DIAGNOSIS — I1310 Hypertensive heart and chronic kidney disease without heart failure, with stage 1 through stage 4 chronic kidney disease, or unspecified chronic kidney disease: Secondary | ICD-10-CM | POA: Diagnosis not present

## 2020-09-01 DIAGNOSIS — N1832 Chronic kidney disease, stage 3b: Secondary | ICD-10-CM | POA: Diagnosis not present

## 2020-09-01 DIAGNOSIS — E1142 Type 2 diabetes mellitus with diabetic polyneuropathy: Secondary | ICD-10-CM | POA: Diagnosis not present

## 2020-09-01 DIAGNOSIS — J181 Lobar pneumonia, unspecified organism: Secondary | ICD-10-CM | POA: Diagnosis not present

## 2020-09-01 DIAGNOSIS — E1122 Type 2 diabetes mellitus with diabetic chronic kidney disease: Secondary | ICD-10-CM | POA: Diagnosis not present

## 2020-09-01 DIAGNOSIS — I2510 Atherosclerotic heart disease of native coronary artery without angina pectoris: Secondary | ICD-10-CM | POA: Diagnosis not present

## 2020-09-02 DIAGNOSIS — J181 Lobar pneumonia, unspecified organism: Secondary | ICD-10-CM | POA: Diagnosis not present

## 2020-09-02 DIAGNOSIS — I1310 Hypertensive heart and chronic kidney disease without heart failure, with stage 1 through stage 4 chronic kidney disease, or unspecified chronic kidney disease: Secondary | ICD-10-CM | POA: Diagnosis not present

## 2020-09-02 DIAGNOSIS — E1142 Type 2 diabetes mellitus with diabetic polyneuropathy: Secondary | ICD-10-CM | POA: Diagnosis not present

## 2020-09-02 DIAGNOSIS — E1122 Type 2 diabetes mellitus with diabetic chronic kidney disease: Secondary | ICD-10-CM | POA: Diagnosis not present

## 2020-09-02 DIAGNOSIS — I2510 Atherosclerotic heart disease of native coronary artery without angina pectoris: Secondary | ICD-10-CM | POA: Diagnosis not present

## 2020-09-02 DIAGNOSIS — N1832 Chronic kidney disease, stage 3b: Secondary | ICD-10-CM | POA: Diagnosis not present

## 2020-09-06 DIAGNOSIS — E1142 Type 2 diabetes mellitus with diabetic polyneuropathy: Secondary | ICD-10-CM | POA: Diagnosis not present

## 2020-09-06 DIAGNOSIS — I2510 Atherosclerotic heart disease of native coronary artery without angina pectoris: Secondary | ICD-10-CM | POA: Diagnosis not present

## 2020-09-06 DIAGNOSIS — J181 Lobar pneumonia, unspecified organism: Secondary | ICD-10-CM | POA: Diagnosis not present

## 2020-09-06 DIAGNOSIS — E1122 Type 2 diabetes mellitus with diabetic chronic kidney disease: Secondary | ICD-10-CM | POA: Diagnosis not present

## 2020-09-06 DIAGNOSIS — I1310 Hypertensive heart and chronic kidney disease without heart failure, with stage 1 through stage 4 chronic kidney disease, or unspecified chronic kidney disease: Secondary | ICD-10-CM | POA: Diagnosis not present

## 2020-09-06 DIAGNOSIS — N1832 Chronic kidney disease, stage 3b: Secondary | ICD-10-CM | POA: Diagnosis not present

## 2020-09-07 DIAGNOSIS — N1832 Chronic kidney disease, stage 3b: Secondary | ICD-10-CM | POA: Diagnosis not present

## 2020-09-07 DIAGNOSIS — E1122 Type 2 diabetes mellitus with diabetic chronic kidney disease: Secondary | ICD-10-CM | POA: Diagnosis not present

## 2020-09-07 DIAGNOSIS — I1310 Hypertensive heart and chronic kidney disease without heart failure, with stage 1 through stage 4 chronic kidney disease, or unspecified chronic kidney disease: Secondary | ICD-10-CM | POA: Diagnosis not present

## 2020-09-07 DIAGNOSIS — E1142 Type 2 diabetes mellitus with diabetic polyneuropathy: Secondary | ICD-10-CM | POA: Diagnosis not present

## 2020-09-07 DIAGNOSIS — I2510 Atherosclerotic heart disease of native coronary artery without angina pectoris: Secondary | ICD-10-CM | POA: Diagnosis not present

## 2020-09-07 DIAGNOSIS — J181 Lobar pneumonia, unspecified organism: Secondary | ICD-10-CM | POA: Diagnosis not present

## 2020-09-08 DIAGNOSIS — E1142 Type 2 diabetes mellitus with diabetic polyneuropathy: Secondary | ICD-10-CM | POA: Diagnosis not present

## 2020-09-08 DIAGNOSIS — N1832 Chronic kidney disease, stage 3b: Secondary | ICD-10-CM | POA: Diagnosis not present

## 2020-09-08 DIAGNOSIS — E1122 Type 2 diabetes mellitus with diabetic chronic kidney disease: Secondary | ICD-10-CM | POA: Diagnosis not present

## 2020-09-08 DIAGNOSIS — J181 Lobar pneumonia, unspecified organism: Secondary | ICD-10-CM | POA: Diagnosis not present

## 2020-09-08 DIAGNOSIS — I2510 Atherosclerotic heart disease of native coronary artery without angina pectoris: Secondary | ICD-10-CM | POA: Diagnosis not present

## 2020-09-08 DIAGNOSIS — I1310 Hypertensive heart and chronic kidney disease without heart failure, with stage 1 through stage 4 chronic kidney disease, or unspecified chronic kidney disease: Secondary | ICD-10-CM | POA: Diagnosis not present

## 2020-09-12 DIAGNOSIS — I1310 Hypertensive heart and chronic kidney disease without heart failure, with stage 1 through stage 4 chronic kidney disease, or unspecified chronic kidney disease: Secondary | ICD-10-CM | POA: Diagnosis not present

## 2020-09-12 DIAGNOSIS — I2510 Atherosclerotic heart disease of native coronary artery without angina pectoris: Secondary | ICD-10-CM | POA: Diagnosis not present

## 2020-09-12 DIAGNOSIS — J181 Lobar pneumonia, unspecified organism: Secondary | ICD-10-CM | POA: Diagnosis not present

## 2020-09-12 DIAGNOSIS — E1122 Type 2 diabetes mellitus with diabetic chronic kidney disease: Secondary | ICD-10-CM | POA: Diagnosis not present

## 2020-09-12 DIAGNOSIS — N1832 Chronic kidney disease, stage 3b: Secondary | ICD-10-CM | POA: Diagnosis not present

## 2020-09-12 DIAGNOSIS — E1142 Type 2 diabetes mellitus with diabetic polyneuropathy: Secondary | ICD-10-CM | POA: Diagnosis not present

## 2020-09-13 DIAGNOSIS — E1122 Type 2 diabetes mellitus with diabetic chronic kidney disease: Secondary | ICD-10-CM | POA: Diagnosis not present

## 2020-09-13 DIAGNOSIS — I2510 Atherosclerotic heart disease of native coronary artery without angina pectoris: Secondary | ICD-10-CM | POA: Diagnosis not present

## 2020-09-13 DIAGNOSIS — I1310 Hypertensive heart and chronic kidney disease without heart failure, with stage 1 through stage 4 chronic kidney disease, or unspecified chronic kidney disease: Secondary | ICD-10-CM | POA: Diagnosis not present

## 2020-09-13 DIAGNOSIS — J181 Lobar pneumonia, unspecified organism: Secondary | ICD-10-CM | POA: Diagnosis not present

## 2020-09-13 DIAGNOSIS — N1832 Chronic kidney disease, stage 3b: Secondary | ICD-10-CM | POA: Diagnosis not present

## 2020-09-13 DIAGNOSIS — E1142 Type 2 diabetes mellitus with diabetic polyneuropathy: Secondary | ICD-10-CM | POA: Diagnosis not present

## 2020-09-14 DIAGNOSIS — I2510 Atherosclerotic heart disease of native coronary artery without angina pectoris: Secondary | ICD-10-CM | POA: Diagnosis not present

## 2020-09-14 DIAGNOSIS — E1142 Type 2 diabetes mellitus with diabetic polyneuropathy: Secondary | ICD-10-CM | POA: Diagnosis not present

## 2020-09-14 DIAGNOSIS — I1310 Hypertensive heart and chronic kidney disease without heart failure, with stage 1 through stage 4 chronic kidney disease, or unspecified chronic kidney disease: Secondary | ICD-10-CM | POA: Diagnosis not present

## 2020-09-14 DIAGNOSIS — E1122 Type 2 diabetes mellitus with diabetic chronic kidney disease: Secondary | ICD-10-CM | POA: Diagnosis not present

## 2020-09-14 DIAGNOSIS — N1832 Chronic kidney disease, stage 3b: Secondary | ICD-10-CM | POA: Diagnosis not present

## 2020-09-14 DIAGNOSIS — J181 Lobar pneumonia, unspecified organism: Secondary | ICD-10-CM | POA: Diagnosis not present

## 2020-09-15 DIAGNOSIS — I2510 Atherosclerotic heart disease of native coronary artery without angina pectoris: Secondary | ICD-10-CM | POA: Diagnosis not present

## 2020-09-15 DIAGNOSIS — E1122 Type 2 diabetes mellitus with diabetic chronic kidney disease: Secondary | ICD-10-CM | POA: Diagnosis not present

## 2020-09-15 DIAGNOSIS — J181 Lobar pneumonia, unspecified organism: Secondary | ICD-10-CM | POA: Diagnosis not present

## 2020-09-15 DIAGNOSIS — E1142 Type 2 diabetes mellitus with diabetic polyneuropathy: Secondary | ICD-10-CM | POA: Diagnosis not present

## 2020-09-15 DIAGNOSIS — N1832 Chronic kidney disease, stage 3b: Secondary | ICD-10-CM | POA: Diagnosis not present

## 2020-09-15 DIAGNOSIS — I1310 Hypertensive heart and chronic kidney disease without heart failure, with stage 1 through stage 4 chronic kidney disease, or unspecified chronic kidney disease: Secondary | ICD-10-CM | POA: Diagnosis not present

## 2020-09-19 DIAGNOSIS — I1310 Hypertensive heart and chronic kidney disease without heart failure, with stage 1 through stage 4 chronic kidney disease, or unspecified chronic kidney disease: Secondary | ICD-10-CM | POA: Diagnosis not present

## 2020-09-19 DIAGNOSIS — I2510 Atherosclerotic heart disease of native coronary artery without angina pectoris: Secondary | ICD-10-CM | POA: Diagnosis not present

## 2020-09-19 DIAGNOSIS — J181 Lobar pneumonia, unspecified organism: Secondary | ICD-10-CM | POA: Diagnosis not present

## 2020-09-19 DIAGNOSIS — E1122 Type 2 diabetes mellitus with diabetic chronic kidney disease: Secondary | ICD-10-CM | POA: Diagnosis not present

## 2020-09-19 DIAGNOSIS — N1832 Chronic kidney disease, stage 3b: Secondary | ICD-10-CM | POA: Diagnosis not present

## 2020-09-19 DIAGNOSIS — E1142 Type 2 diabetes mellitus with diabetic polyneuropathy: Secondary | ICD-10-CM | POA: Diagnosis not present

## 2020-09-20 DIAGNOSIS — I2510 Atherosclerotic heart disease of native coronary artery without angina pectoris: Secondary | ICD-10-CM | POA: Diagnosis not present

## 2020-09-20 DIAGNOSIS — J181 Lobar pneumonia, unspecified organism: Secondary | ICD-10-CM | POA: Diagnosis not present

## 2020-09-20 DIAGNOSIS — I1310 Hypertensive heart and chronic kidney disease without heart failure, with stage 1 through stage 4 chronic kidney disease, or unspecified chronic kidney disease: Secondary | ICD-10-CM | POA: Diagnosis not present

## 2020-09-20 DIAGNOSIS — N1832 Chronic kidney disease, stage 3b: Secondary | ICD-10-CM | POA: Diagnosis not present

## 2020-09-20 DIAGNOSIS — E1122 Type 2 diabetes mellitus with diabetic chronic kidney disease: Secondary | ICD-10-CM | POA: Diagnosis not present

## 2020-09-20 DIAGNOSIS — E1142 Type 2 diabetes mellitus with diabetic polyneuropathy: Secondary | ICD-10-CM | POA: Diagnosis not present

## 2020-09-21 DIAGNOSIS — N1832 Chronic kidney disease, stage 3b: Secondary | ICD-10-CM | POA: Diagnosis not present

## 2020-09-21 DIAGNOSIS — J181 Lobar pneumonia, unspecified organism: Secondary | ICD-10-CM | POA: Diagnosis not present

## 2020-09-21 DIAGNOSIS — I1310 Hypertensive heart and chronic kidney disease without heart failure, with stage 1 through stage 4 chronic kidney disease, or unspecified chronic kidney disease: Secondary | ICD-10-CM | POA: Diagnosis not present

## 2020-09-21 DIAGNOSIS — E1142 Type 2 diabetes mellitus with diabetic polyneuropathy: Secondary | ICD-10-CM | POA: Diagnosis not present

## 2020-09-21 DIAGNOSIS — I2510 Atherosclerotic heart disease of native coronary artery without angina pectoris: Secondary | ICD-10-CM | POA: Diagnosis not present

## 2020-09-21 DIAGNOSIS — E1122 Type 2 diabetes mellitus with diabetic chronic kidney disease: Secondary | ICD-10-CM | POA: Diagnosis not present

## 2020-09-22 DIAGNOSIS — E1142 Type 2 diabetes mellitus with diabetic polyneuropathy: Secondary | ICD-10-CM | POA: Diagnosis not present

## 2020-09-22 DIAGNOSIS — E1122 Type 2 diabetes mellitus with diabetic chronic kidney disease: Secondary | ICD-10-CM | POA: Diagnosis not present

## 2020-09-22 DIAGNOSIS — I1310 Hypertensive heart and chronic kidney disease without heart failure, with stage 1 through stage 4 chronic kidney disease, or unspecified chronic kidney disease: Secondary | ICD-10-CM | POA: Diagnosis not present

## 2020-09-22 DIAGNOSIS — N1832 Chronic kidney disease, stage 3b: Secondary | ICD-10-CM | POA: Diagnosis not present

## 2020-09-22 DIAGNOSIS — I2510 Atherosclerotic heart disease of native coronary artery without angina pectoris: Secondary | ICD-10-CM | POA: Diagnosis not present

## 2020-09-22 DIAGNOSIS — J181 Lobar pneumonia, unspecified organism: Secondary | ICD-10-CM | POA: Diagnosis not present

## 2020-09-23 DIAGNOSIS — I2510 Atherosclerotic heart disease of native coronary artery without angina pectoris: Secondary | ICD-10-CM | POA: Diagnosis not present

## 2020-09-23 DIAGNOSIS — Z7984 Long term (current) use of oral hypoglycemic drugs: Secondary | ICD-10-CM | POA: Diagnosis not present

## 2020-09-23 DIAGNOSIS — E1142 Type 2 diabetes mellitus with diabetic polyneuropathy: Secondary | ICD-10-CM | POA: Diagnosis not present

## 2020-09-23 DIAGNOSIS — R1310 Dysphagia, unspecified: Secondary | ICD-10-CM | POA: Diagnosis not present

## 2020-09-23 DIAGNOSIS — K219 Gastro-esophageal reflux disease without esophagitis: Secondary | ICD-10-CM | POA: Diagnosis not present

## 2020-09-23 DIAGNOSIS — I088 Other rheumatic multiple valve diseases: Secondary | ICD-10-CM | POA: Diagnosis not present

## 2020-09-23 DIAGNOSIS — J181 Lobar pneumonia, unspecified organism: Secondary | ICD-10-CM | POA: Diagnosis not present

## 2020-09-23 DIAGNOSIS — E785 Hyperlipidemia, unspecified: Secondary | ICD-10-CM | POA: Diagnosis not present

## 2020-09-23 DIAGNOSIS — I1310 Hypertensive heart and chronic kidney disease without heart failure, with stage 1 through stage 4 chronic kidney disease, or unspecified chronic kidney disease: Secondary | ICD-10-CM | POA: Diagnosis not present

## 2020-09-23 DIAGNOSIS — F0390 Unspecified dementia without behavioral disturbance: Secondary | ICD-10-CM | POA: Diagnosis not present

## 2020-09-23 DIAGNOSIS — J9611 Chronic respiratory failure with hypoxia: Secondary | ICD-10-CM | POA: Diagnosis not present

## 2020-09-23 DIAGNOSIS — Z7951 Long term (current) use of inhaled steroids: Secondary | ICD-10-CM | POA: Diagnosis not present

## 2020-09-23 DIAGNOSIS — E876 Hypokalemia: Secondary | ICD-10-CM | POA: Diagnosis not present

## 2020-09-23 DIAGNOSIS — I700 Atherosclerosis of aorta: Secondary | ICD-10-CM | POA: Diagnosis not present

## 2020-09-23 DIAGNOSIS — N1832 Chronic kidney disease, stage 3b: Secondary | ICD-10-CM | POA: Diagnosis not present

## 2020-09-23 DIAGNOSIS — Z9181 History of falling: Secondary | ICD-10-CM | POA: Diagnosis not present

## 2020-09-23 DIAGNOSIS — I2721 Secondary pulmonary arterial hypertension: Secondary | ICD-10-CM | POA: Diagnosis not present

## 2020-09-23 DIAGNOSIS — Z87891 Personal history of nicotine dependence: Secondary | ICD-10-CM | POA: Diagnosis not present

## 2020-09-23 DIAGNOSIS — Z85038 Personal history of other malignant neoplasm of large intestine: Secondary | ICD-10-CM | POA: Diagnosis not present

## 2020-09-23 DIAGNOSIS — Z791 Long term (current) use of non-steroidal anti-inflammatories (NSAID): Secondary | ICD-10-CM | POA: Diagnosis not present

## 2020-09-23 DIAGNOSIS — E1122 Type 2 diabetes mellitus with diabetic chronic kidney disease: Secondary | ICD-10-CM | POA: Diagnosis not present

## 2020-09-23 DIAGNOSIS — Z9981 Dependence on supplemental oxygen: Secondary | ICD-10-CM | POA: Diagnosis not present

## 2020-09-23 DIAGNOSIS — M810 Age-related osteoporosis without current pathological fracture: Secondary | ICD-10-CM | POA: Diagnosis not present

## 2020-09-28 DIAGNOSIS — I2510 Atherosclerotic heart disease of native coronary artery without angina pectoris: Secondary | ICD-10-CM | POA: Diagnosis not present

## 2020-09-28 DIAGNOSIS — N1831 Chronic kidney disease, stage 3a: Secondary | ICD-10-CM | POA: Diagnosis not present

## 2020-09-28 DIAGNOSIS — J449 Chronic obstructive pulmonary disease, unspecified: Secondary | ICD-10-CM | POA: Diagnosis not present

## 2020-09-28 DIAGNOSIS — J181 Lobar pneumonia, unspecified organism: Secondary | ICD-10-CM | POA: Diagnosis not present

## 2020-09-28 DIAGNOSIS — N1832 Chronic kidney disease, stage 3b: Secondary | ICD-10-CM | POA: Diagnosis not present

## 2020-09-28 DIAGNOSIS — E119 Type 2 diabetes mellitus without complications: Secondary | ICD-10-CM | POA: Diagnosis not present

## 2020-09-28 DIAGNOSIS — F419 Anxiety disorder, unspecified: Secondary | ICD-10-CM | POA: Diagnosis not present

## 2020-09-28 DIAGNOSIS — I10 Essential (primary) hypertension: Secondary | ICD-10-CM | POA: Diagnosis not present

## 2020-09-28 DIAGNOSIS — E1122 Type 2 diabetes mellitus with diabetic chronic kidney disease: Secondary | ICD-10-CM | POA: Diagnosis not present

## 2020-09-28 DIAGNOSIS — E1142 Type 2 diabetes mellitus with diabetic polyneuropathy: Secondary | ICD-10-CM | POA: Diagnosis not present

## 2020-09-28 DIAGNOSIS — I1310 Hypertensive heart and chronic kidney disease without heart failure, with stage 1 through stage 4 chronic kidney disease, or unspecified chronic kidney disease: Secondary | ICD-10-CM | POA: Diagnosis not present

## 2020-09-28 DIAGNOSIS — Z515 Encounter for palliative care: Secondary | ICD-10-CM | POA: Diagnosis not present

## 2020-09-28 DIAGNOSIS — E7800 Pure hypercholesterolemia, unspecified: Secondary | ICD-10-CM | POA: Diagnosis not present

## 2020-09-29 DIAGNOSIS — I1310 Hypertensive heart and chronic kidney disease without heart failure, with stage 1 through stage 4 chronic kidney disease, or unspecified chronic kidney disease: Secondary | ICD-10-CM | POA: Diagnosis not present

## 2020-09-29 DIAGNOSIS — E1142 Type 2 diabetes mellitus with diabetic polyneuropathy: Secondary | ICD-10-CM | POA: Diagnosis not present

## 2020-09-29 DIAGNOSIS — J181 Lobar pneumonia, unspecified organism: Secondary | ICD-10-CM | POA: Diagnosis not present

## 2020-09-29 DIAGNOSIS — I2510 Atherosclerotic heart disease of native coronary artery without angina pectoris: Secondary | ICD-10-CM | POA: Diagnosis not present

## 2020-09-29 DIAGNOSIS — N1832 Chronic kidney disease, stage 3b: Secondary | ICD-10-CM | POA: Diagnosis not present

## 2020-09-29 DIAGNOSIS — E1122 Type 2 diabetes mellitus with diabetic chronic kidney disease: Secondary | ICD-10-CM | POA: Diagnosis not present

## 2020-09-30 DIAGNOSIS — J181 Lobar pneumonia, unspecified organism: Secondary | ICD-10-CM | POA: Diagnosis not present

## 2020-09-30 DIAGNOSIS — I2510 Atherosclerotic heart disease of native coronary artery without angina pectoris: Secondary | ICD-10-CM | POA: Diagnosis not present

## 2020-09-30 DIAGNOSIS — I1310 Hypertensive heart and chronic kidney disease without heart failure, with stage 1 through stage 4 chronic kidney disease, or unspecified chronic kidney disease: Secondary | ICD-10-CM | POA: Diagnosis not present

## 2020-09-30 DIAGNOSIS — E1122 Type 2 diabetes mellitus with diabetic chronic kidney disease: Secondary | ICD-10-CM | POA: Diagnosis not present

## 2020-09-30 DIAGNOSIS — E1142 Type 2 diabetes mellitus with diabetic polyneuropathy: Secondary | ICD-10-CM | POA: Diagnosis not present

## 2020-09-30 DIAGNOSIS — N1832 Chronic kidney disease, stage 3b: Secondary | ICD-10-CM | POA: Diagnosis not present

## 2020-10-01 ENCOUNTER — Other Ambulatory Visit (HOSPITAL_COMMUNITY)
Admission: RE | Admit: 2020-10-01 | Discharge: 2020-10-01 | Disposition: A | Discharge status: Home / Self Care | Payer: Medicare Other | Source: Other Acute Inpatient Hospital | Attending: Family Medicine | Admitting: Family Medicine

## 2020-10-01 DIAGNOSIS — I1310 Hypertensive heart and chronic kidney disease without heart failure, with stage 1 through stage 4 chronic kidney disease, or unspecified chronic kidney disease: Secondary | ICD-10-CM | POA: Diagnosis not present

## 2020-10-01 DIAGNOSIS — N1832 Chronic kidney disease, stage 3b: Secondary | ICD-10-CM | POA: Diagnosis not present

## 2020-10-01 DIAGNOSIS — E1142 Type 2 diabetes mellitus with diabetic polyneuropathy: Secondary | ICD-10-CM | POA: Diagnosis not present

## 2020-10-01 DIAGNOSIS — E1122 Type 2 diabetes mellitus with diabetic chronic kidney disease: Secondary | ICD-10-CM | POA: Diagnosis not present

## 2020-10-01 DIAGNOSIS — R609 Edema, unspecified: Secondary | ICD-10-CM | POA: Diagnosis not present

## 2020-10-01 DIAGNOSIS — J181 Lobar pneumonia, unspecified organism: Secondary | ICD-10-CM | POA: Diagnosis not present

## 2020-10-01 DIAGNOSIS — I2510 Atherosclerotic heart disease of native coronary artery without angina pectoris: Secondary | ICD-10-CM | POA: Diagnosis not present

## 2020-10-01 LAB — COMPREHENSIVE METABOLIC PANEL
ALT: 10 U/L (ref 0–44)
AST: 16 U/L (ref 15–41)
Albumin: 2.8 g/dL — ABNORMAL LOW (ref 3.5–5.0)
Alkaline Phosphatase: 64 U/L (ref 38–126)
Anion gap: 7 (ref 5–15)
BUN: 13 mg/dL (ref 8–23)
CO2: 33 mmol/L — ABNORMAL HIGH (ref 22–32)
Calcium: 9.9 mg/dL (ref 8.9–10.3)
Chloride: 96 mmol/L — ABNORMAL LOW (ref 98–111)
Creatinine, Ser: 0.6 mg/dL (ref 0.44–1.00)
GFR, Estimated: >60 mL/min (ref >60)
Glucose, Bld: 182 mg/dL — ABNORMAL HIGH (ref 70–99)
Potassium: 3.1 mmol/L — ABNORMAL LOW (ref 3.5–5.1)
Sodium: 136 mmol/L (ref 135–145)
Total Bilirubin: 0.6 mg/dL (ref 0.3–1.2)
Total Protein: 5.9 g/dL — ABNORMAL LOW (ref 6.5–8.1)

## 2020-10-03 DIAGNOSIS — J181 Lobar pneumonia, unspecified organism: Secondary | ICD-10-CM | POA: Diagnosis not present

## 2020-10-03 DIAGNOSIS — E1122 Type 2 diabetes mellitus with diabetic chronic kidney disease: Secondary | ICD-10-CM | POA: Diagnosis not present

## 2020-10-03 DIAGNOSIS — E1142 Type 2 diabetes mellitus with diabetic polyneuropathy: Secondary | ICD-10-CM | POA: Diagnosis not present

## 2020-10-03 DIAGNOSIS — I2510 Atherosclerotic heart disease of native coronary artery without angina pectoris: Secondary | ICD-10-CM | POA: Diagnosis not present

## 2020-10-03 DIAGNOSIS — I1310 Hypertensive heart and chronic kidney disease without heart failure, with stage 1 through stage 4 chronic kidney disease, or unspecified chronic kidney disease: Secondary | ICD-10-CM | POA: Diagnosis not present

## 2020-10-03 DIAGNOSIS — N1832 Chronic kidney disease, stage 3b: Secondary | ICD-10-CM | POA: Diagnosis not present

## 2020-10-04 DIAGNOSIS — E1122 Type 2 diabetes mellitus with diabetic chronic kidney disease: Secondary | ICD-10-CM | POA: Diagnosis not present

## 2020-10-04 DIAGNOSIS — I2510 Atherosclerotic heart disease of native coronary artery without angina pectoris: Secondary | ICD-10-CM | POA: Diagnosis not present

## 2020-10-04 DIAGNOSIS — N1832 Chronic kidney disease, stage 3b: Secondary | ICD-10-CM | POA: Diagnosis not present

## 2020-10-04 DIAGNOSIS — E1142 Type 2 diabetes mellitus with diabetic polyneuropathy: Secondary | ICD-10-CM | POA: Diagnosis not present

## 2020-10-04 DIAGNOSIS — J181 Lobar pneumonia, unspecified organism: Secondary | ICD-10-CM | POA: Diagnosis not present

## 2020-10-04 DIAGNOSIS — I1310 Hypertensive heart and chronic kidney disease without heart failure, with stage 1 through stage 4 chronic kidney disease, or unspecified chronic kidney disease: Secondary | ICD-10-CM | POA: Diagnosis not present

## 2020-10-05 DIAGNOSIS — E1142 Type 2 diabetes mellitus with diabetic polyneuropathy: Secondary | ICD-10-CM | POA: Diagnosis not present

## 2020-10-05 DIAGNOSIS — N1832 Chronic kidney disease, stage 3b: Secondary | ICD-10-CM | POA: Diagnosis not present

## 2020-10-05 DIAGNOSIS — E1122 Type 2 diabetes mellitus with diabetic chronic kidney disease: Secondary | ICD-10-CM | POA: Diagnosis not present

## 2020-10-05 DIAGNOSIS — I2510 Atherosclerotic heart disease of native coronary artery without angina pectoris: Secondary | ICD-10-CM | POA: Diagnosis not present

## 2020-10-05 DIAGNOSIS — J181 Lobar pneumonia, unspecified organism: Secondary | ICD-10-CM | POA: Diagnosis not present

## 2020-10-05 DIAGNOSIS — I1310 Hypertensive heart and chronic kidney disease without heart failure, with stage 1 through stage 4 chronic kidney disease, or unspecified chronic kidney disease: Secondary | ICD-10-CM | POA: Diagnosis not present

## 2020-10-10 DIAGNOSIS — E1122 Type 2 diabetes mellitus with diabetic chronic kidney disease: Secondary | ICD-10-CM | POA: Diagnosis not present

## 2020-10-10 DIAGNOSIS — I2510 Atherosclerotic heart disease of native coronary artery without angina pectoris: Secondary | ICD-10-CM | POA: Diagnosis not present

## 2020-10-10 DIAGNOSIS — J181 Lobar pneumonia, unspecified organism: Secondary | ICD-10-CM | POA: Diagnosis not present

## 2020-10-10 DIAGNOSIS — I1310 Hypertensive heart and chronic kidney disease without heart failure, with stage 1 through stage 4 chronic kidney disease, or unspecified chronic kidney disease: Secondary | ICD-10-CM | POA: Diagnosis not present

## 2020-10-10 DIAGNOSIS — N1832 Chronic kidney disease, stage 3b: Secondary | ICD-10-CM | POA: Diagnosis not present

## 2020-10-10 DIAGNOSIS — E1142 Type 2 diabetes mellitus with diabetic polyneuropathy: Secondary | ICD-10-CM | POA: Diagnosis not present

## 2020-10-12 DIAGNOSIS — N1831 Chronic kidney disease, stage 3a: Secondary | ICD-10-CM | POA: Diagnosis not present

## 2020-10-12 DIAGNOSIS — Z515 Encounter for palliative care: Secondary | ICD-10-CM | POA: Diagnosis not present

## 2020-10-12 DIAGNOSIS — J449 Chronic obstructive pulmonary disease, unspecified: Secondary | ICD-10-CM | POA: Diagnosis not present

## 2020-10-18 DIAGNOSIS — N1832 Chronic kidney disease, stage 3b: Secondary | ICD-10-CM | POA: Diagnosis not present

## 2020-10-18 DIAGNOSIS — J181 Lobar pneumonia, unspecified organism: Secondary | ICD-10-CM | POA: Diagnosis not present

## 2020-10-18 DIAGNOSIS — E1122 Type 2 diabetes mellitus with diabetic chronic kidney disease: Secondary | ICD-10-CM | POA: Diagnosis not present

## 2020-10-18 DIAGNOSIS — E1142 Type 2 diabetes mellitus with diabetic polyneuropathy: Secondary | ICD-10-CM | POA: Diagnosis not present

## 2020-10-18 DIAGNOSIS — I2510 Atherosclerotic heart disease of native coronary artery without angina pectoris: Secondary | ICD-10-CM | POA: Diagnosis not present

## 2020-10-18 DIAGNOSIS — I1310 Hypertensive heart and chronic kidney disease without heart failure, with stage 1 through stage 4 chronic kidney disease, or unspecified chronic kidney disease: Secondary | ICD-10-CM | POA: Diagnosis not present

## 2020-10-20 DIAGNOSIS — E1122 Type 2 diabetes mellitus with diabetic chronic kidney disease: Secondary | ICD-10-CM | POA: Diagnosis not present

## 2020-10-20 DIAGNOSIS — E1142 Type 2 diabetes mellitus with diabetic polyneuropathy: Secondary | ICD-10-CM | POA: Diagnosis not present

## 2020-10-20 DIAGNOSIS — J181 Lobar pneumonia, unspecified organism: Secondary | ICD-10-CM | POA: Diagnosis not present

## 2020-10-20 DIAGNOSIS — I2510 Atherosclerotic heart disease of native coronary artery without angina pectoris: Secondary | ICD-10-CM | POA: Diagnosis not present

## 2020-10-20 DIAGNOSIS — N1832 Chronic kidney disease, stage 3b: Secondary | ICD-10-CM | POA: Diagnosis not present

## 2020-10-20 DIAGNOSIS — I1310 Hypertensive heart and chronic kidney disease without heart failure, with stage 1 through stage 4 chronic kidney disease, or unspecified chronic kidney disease: Secondary | ICD-10-CM | POA: Diagnosis not present

## 2020-11-09 DIAGNOSIS — N1831 Chronic kidney disease, stage 3a: Secondary | ICD-10-CM | POA: Diagnosis not present

## 2020-11-09 DIAGNOSIS — J449 Chronic obstructive pulmonary disease, unspecified: Secondary | ICD-10-CM | POA: Diagnosis not present

## 2020-11-09 DIAGNOSIS — Z515 Encounter for palliative care: Secondary | ICD-10-CM | POA: Diagnosis not present

## 2020-12-22 DIAGNOSIS — Z515 Encounter for palliative care: Secondary | ICD-10-CM | POA: Diagnosis not present

## 2020-12-22 DIAGNOSIS — J449 Chronic obstructive pulmonary disease, unspecified: Secondary | ICD-10-CM | POA: Diagnosis not present

## 2020-12-22 DIAGNOSIS — G3184 Mild cognitive impairment, so stated: Secondary | ICD-10-CM | POA: Diagnosis not present

## 2021-02-02 DIAGNOSIS — Z515 Encounter for palliative care: Secondary | ICD-10-CM | POA: Diagnosis not present

## 2021-02-02 DIAGNOSIS — J449 Chronic obstructive pulmonary disease, unspecified: Secondary | ICD-10-CM | POA: Diagnosis not present

## 2021-02-02 DIAGNOSIS — G3184 Mild cognitive impairment, so stated: Secondary | ICD-10-CM | POA: Diagnosis not present

## 2021-03-21 DIAGNOSIS — Z515 Encounter for palliative care: Secondary | ICD-10-CM | POA: Diagnosis not present

## 2021-03-21 DIAGNOSIS — J449 Chronic obstructive pulmonary disease, unspecified: Secondary | ICD-10-CM | POA: Diagnosis not present

## 2021-03-21 DIAGNOSIS — G3184 Mild cognitive impairment, so stated: Secondary | ICD-10-CM | POA: Diagnosis not present

## 2021-04-12 DIAGNOSIS — Z23 Encounter for immunization: Secondary | ICD-10-CM | POA: Diagnosis not present

## 2021-05-12 DIAGNOSIS — G3184 Mild cognitive impairment, so stated: Secondary | ICD-10-CM | POA: Diagnosis not present

## 2021-05-12 DIAGNOSIS — Z515 Encounter for palliative care: Secondary | ICD-10-CM | POA: Diagnosis not present

## 2021-05-12 DIAGNOSIS — J449 Chronic obstructive pulmonary disease, unspecified: Secondary | ICD-10-CM | POA: Diagnosis not present

## 2021-05-14 IMAGING — DX DG CHEST 2V
2 series · 2 of 2 positions shown · non-contrast
Comparison: None.

CLINICAL DATA: Cough.

EXAM:
CHEST - 2 VIEW

[view not recorded]
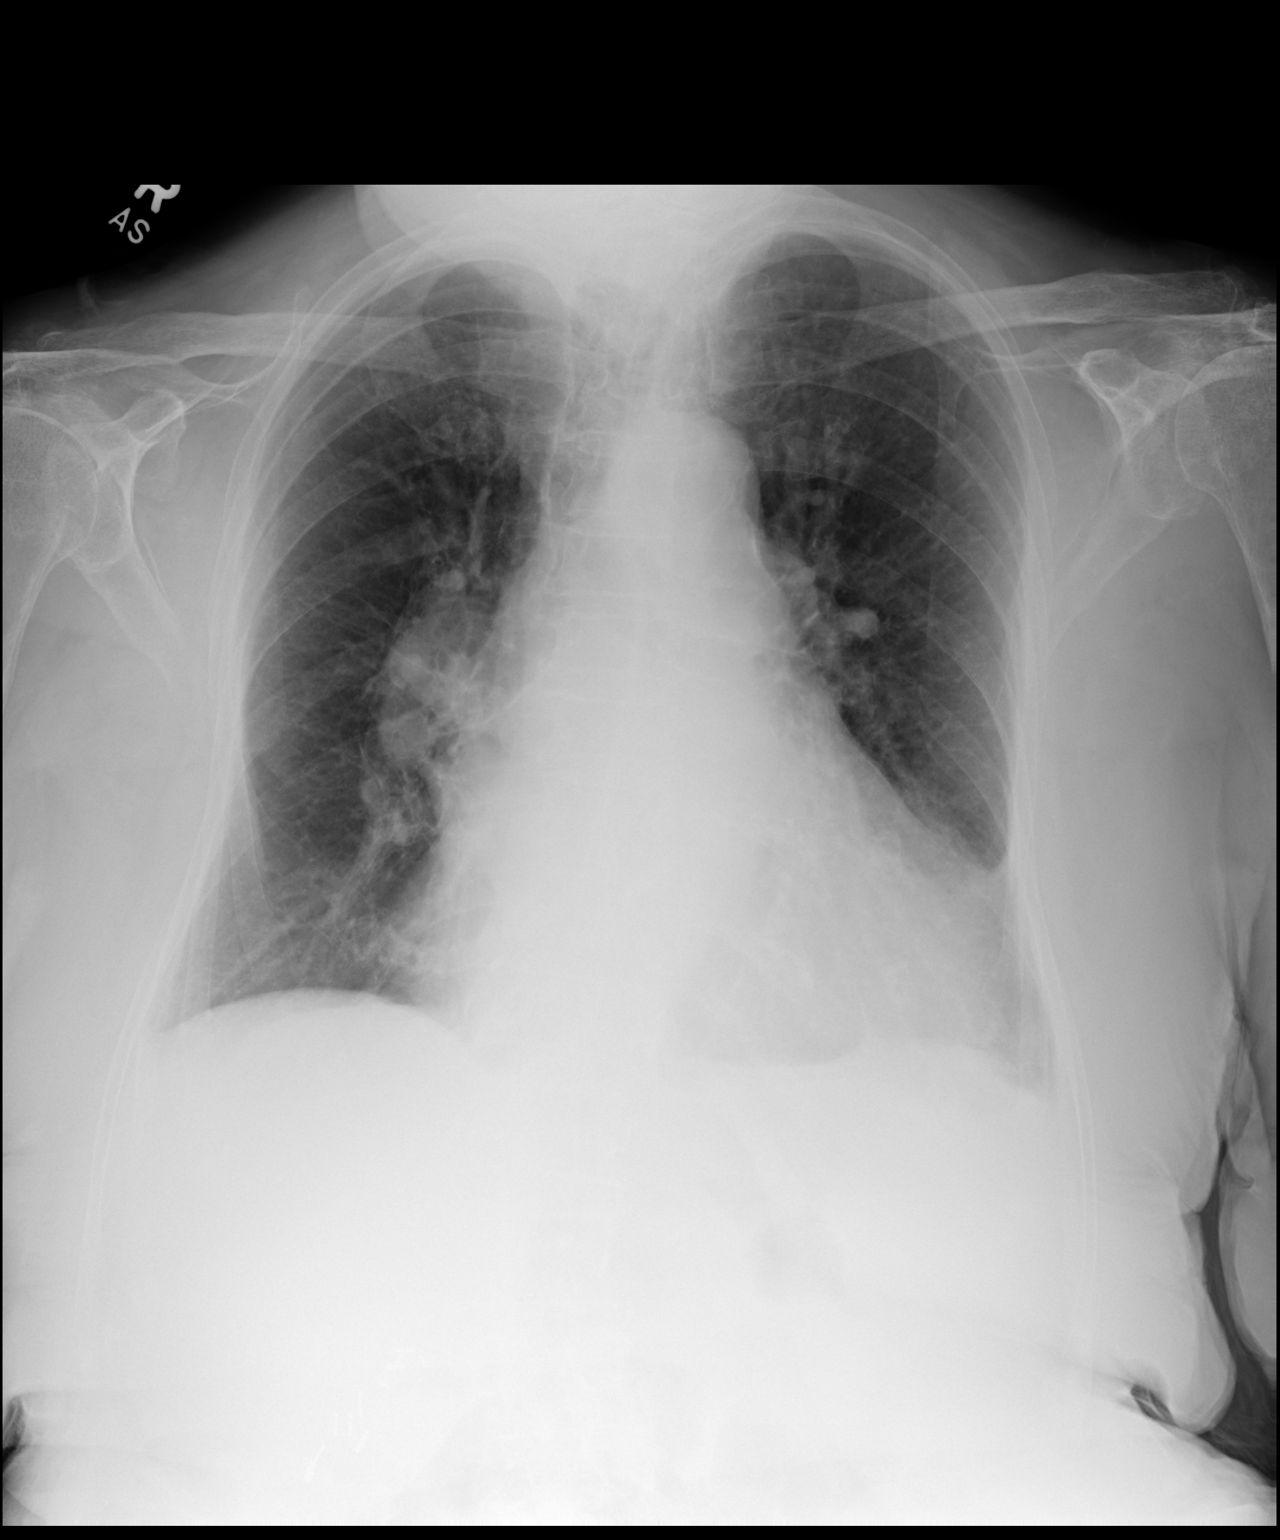

[dg chest 2 view]
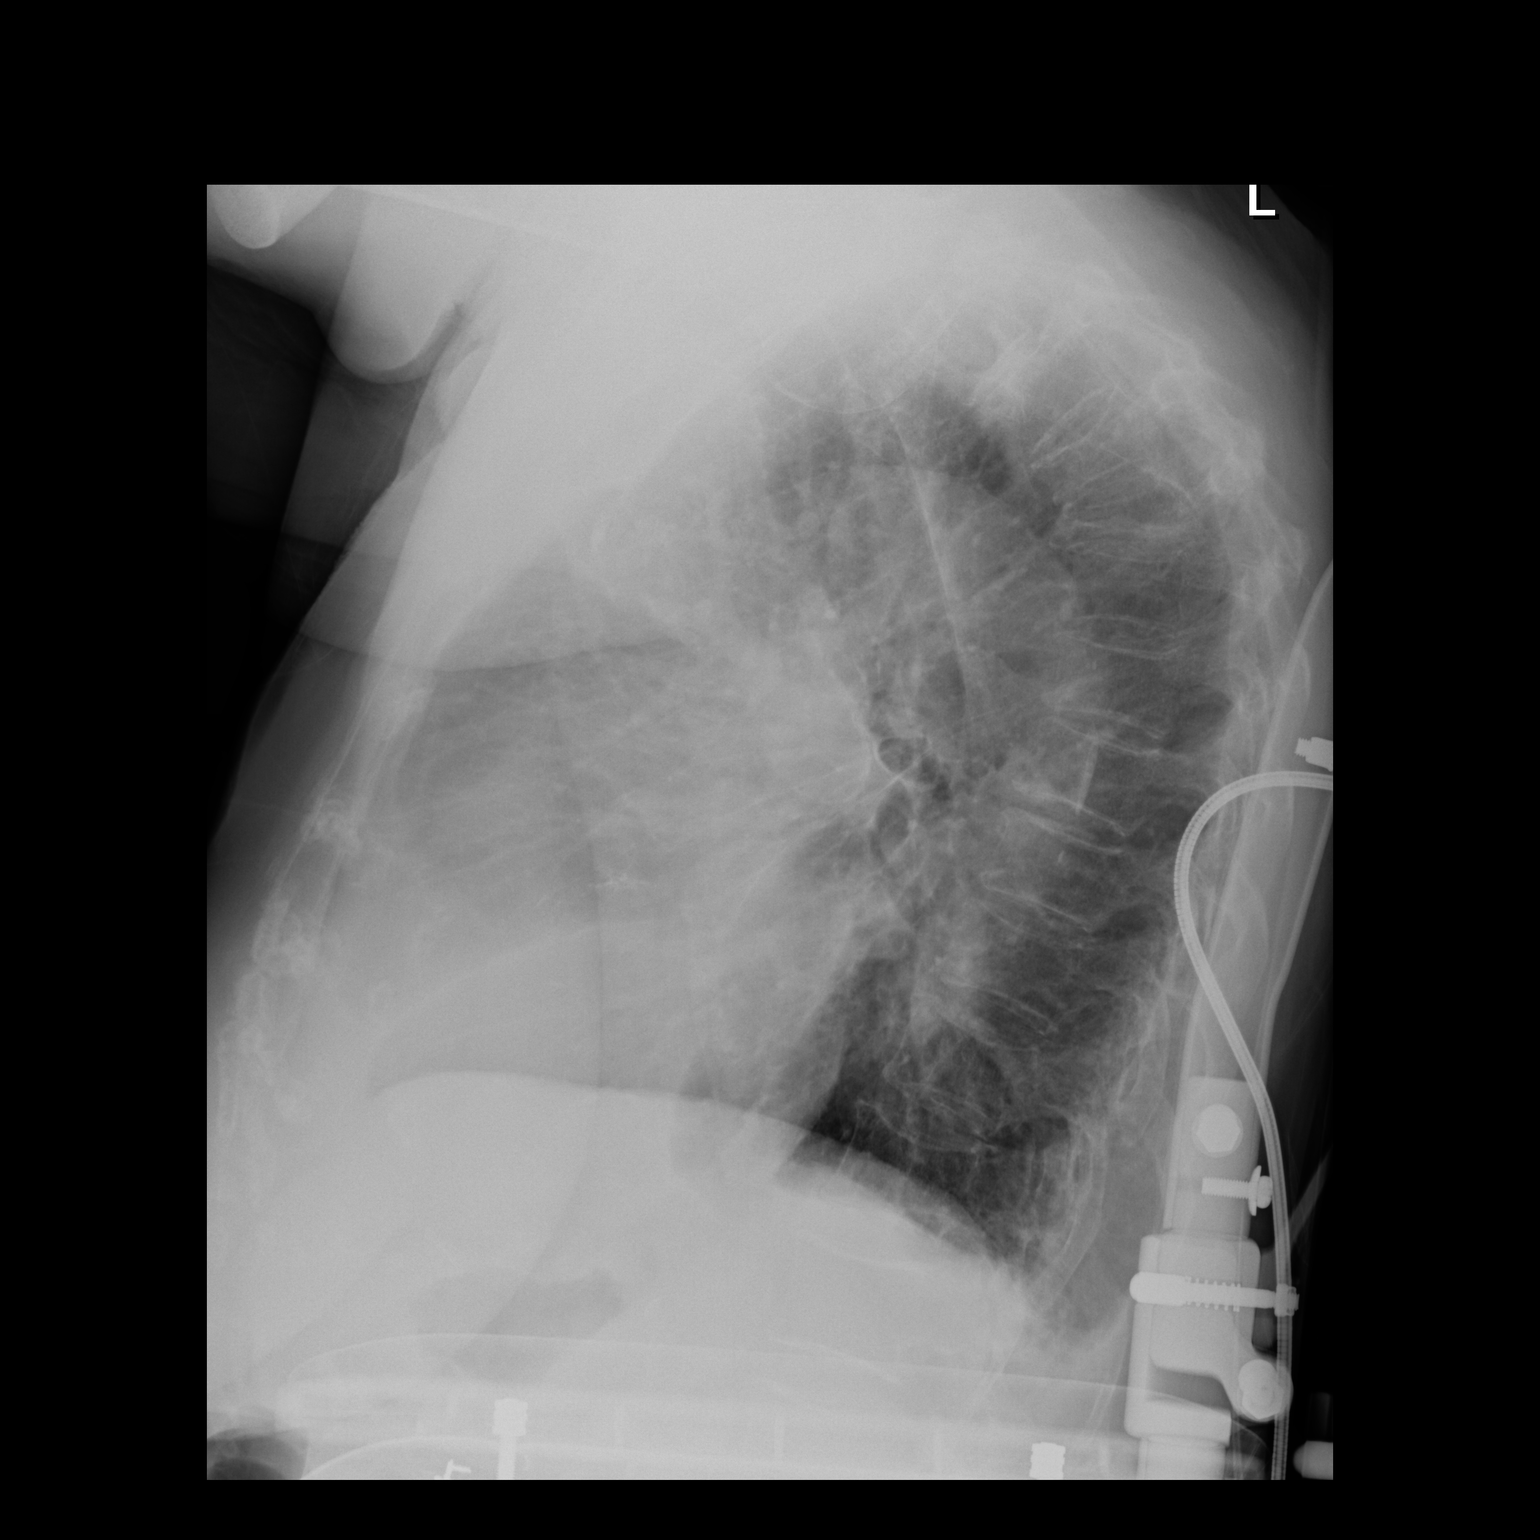

[2 of 2 positions shown; findings below may reference images not displayed]

FINDINGS: The heart is enlarged. There is moderate tortuosity and
calcification of the thoracic aorta.

The pulmonary hila are prominent, right greater than left but this
is likely prominent pulmonary arteries. No definite infiltrates or
effusions. No worrisome pulmonary lesions.

The bony thorax is intact. Probable remote mid to upper thoracic
compression fractures.
IMPRESSION: 1. Cardiac enlargement.
2. No definite infiltrates or effusions.

## 2021-05-24 DIAGNOSIS — I10 Essential (primary) hypertension: Secondary | ICD-10-CM | POA: Diagnosis not present

## 2021-05-24 DIAGNOSIS — E1169 Type 2 diabetes mellitus with other specified complication: Secondary | ICD-10-CM | POA: Diagnosis not present

## 2021-05-24 DIAGNOSIS — I2721 Secondary pulmonary arterial hypertension: Secondary | ICD-10-CM | POA: Diagnosis not present

## 2021-05-24 DIAGNOSIS — N1832 Chronic kidney disease, stage 3b: Secondary | ICD-10-CM | POA: Diagnosis not present

## 2021-05-24 DIAGNOSIS — I340 Nonrheumatic mitral (valve) insufficiency: Secondary | ICD-10-CM | POA: Diagnosis not present

## 2021-05-24 DIAGNOSIS — J449 Chronic obstructive pulmonary disease, unspecified: Secondary | ICD-10-CM | POA: Diagnosis not present

## 2021-05-24 DIAGNOSIS — E7800 Pure hypercholesterolemia, unspecified: Secondary | ICD-10-CM | POA: Diagnosis not present

## 2021-05-24 DIAGNOSIS — I700 Atherosclerosis of aorta: Secondary | ICD-10-CM | POA: Diagnosis not present

## 2021-05-29 DIAGNOSIS — H353132 Nonexudative age-related macular degeneration, bilateral, intermediate dry stage: Secondary | ICD-10-CM | POA: Diagnosis not present

## 2021-06-01 DIAGNOSIS — E1169 Type 2 diabetes mellitus with other specified complication: Secondary | ICD-10-CM | POA: Diagnosis not present

## 2021-07-09 IMAGING — CT CT CHEST W/O CM
1 series · 15 of 34 positions shown, 19 images · non-contrast
Comparison: None.

CLINICAL DATA: Cough and fatigue for 2 months. Personal history of
colon carcinoma. Former smoker.

EXAM:
CT CHEST WITHOUT CONTRAST
TECHNIQUE: Multidetector CT imaging of the chest was performed following the
standard protocol without IV contrast.

[Series 2: chest w/(date) · axial · 0.68mm/px · z∈[-240,-8]mm · 15 of 138 slices shown, 19 images]
[im 11/138  mediastinal]
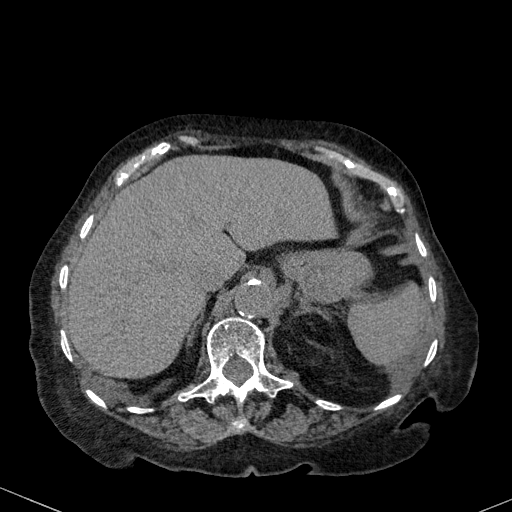
[im 11/138  lung]
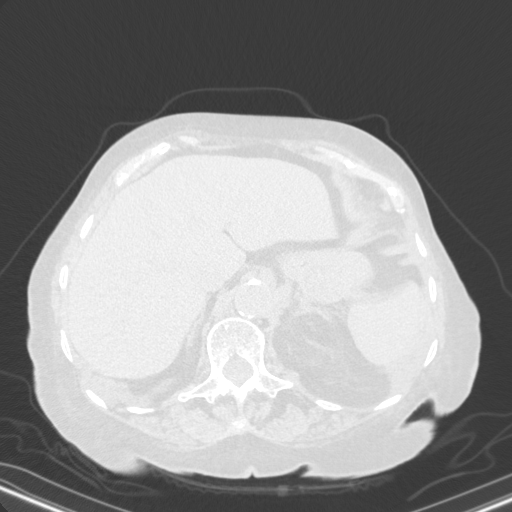
[im 21/138  lung]
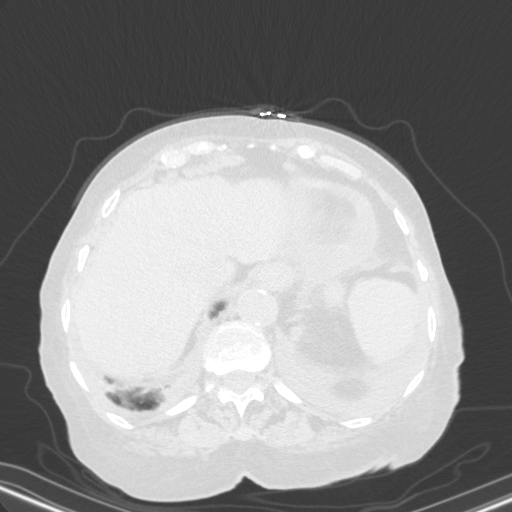
[im 28/138  lung]
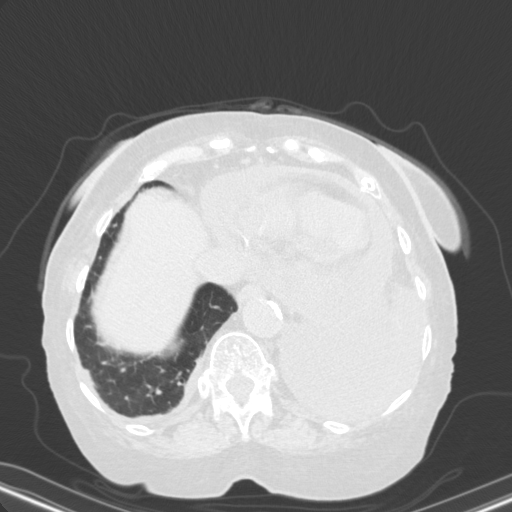
[im 36/138  lung]
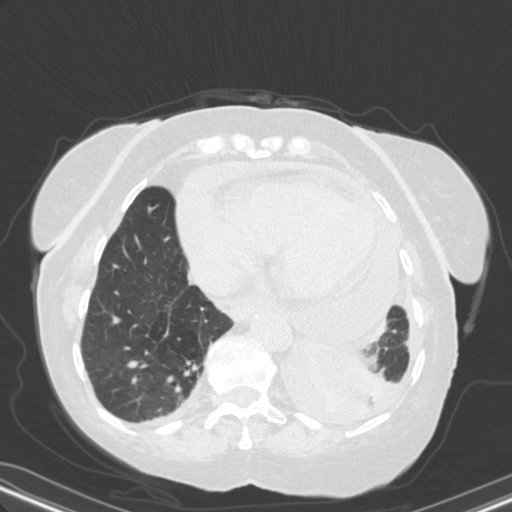
[im 46/138  mediastinal]
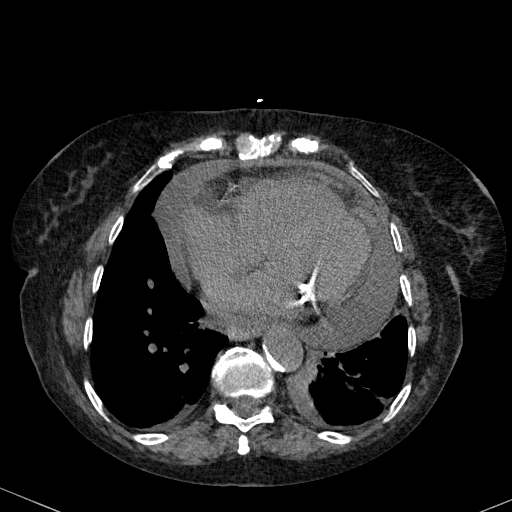
[im 46/138  lung]
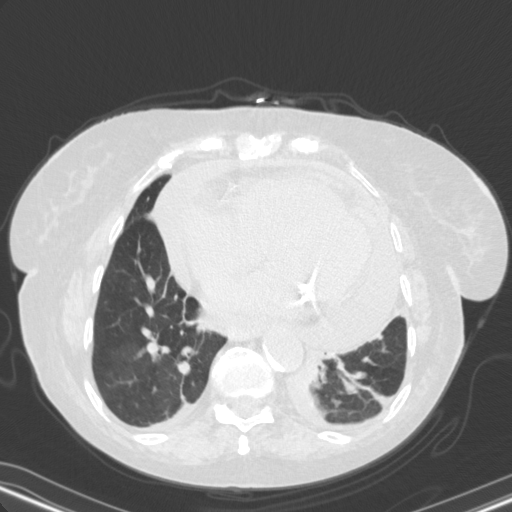
[im 55/138  lung]
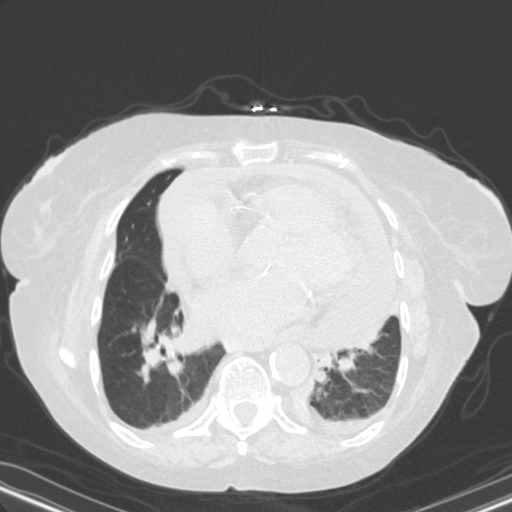
[im 61/138  lung]
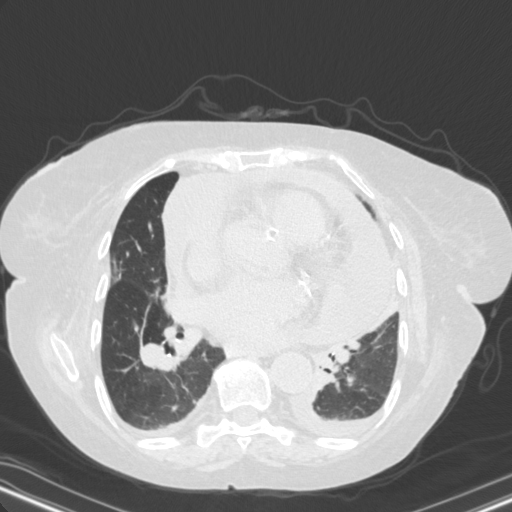
[im 72/138  lung]
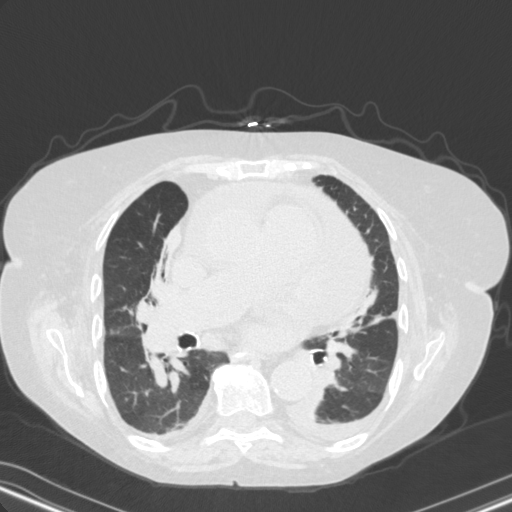
[im 77/138  mediastinal]
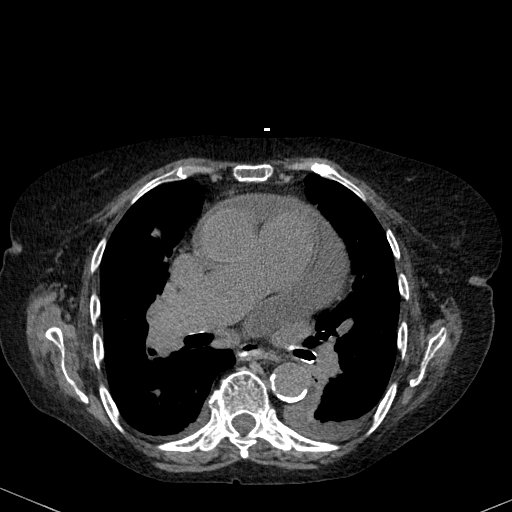
[im 77/138  lung]
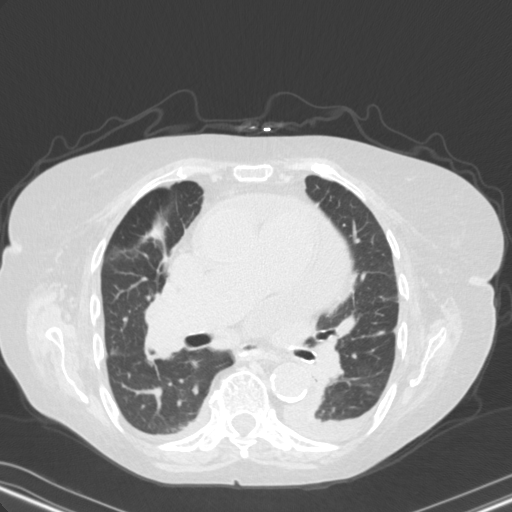
[im 83/138  lung]
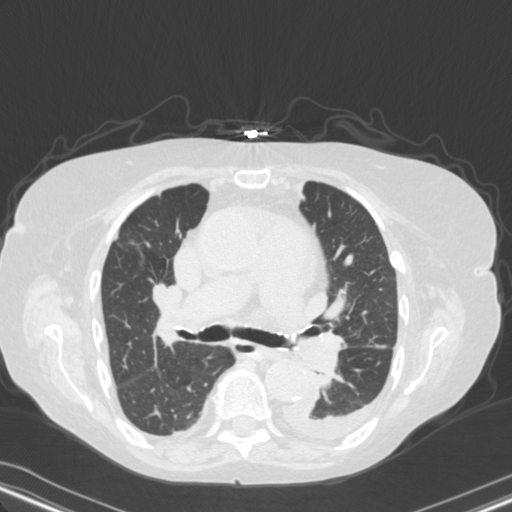
[im 92/138  lung]
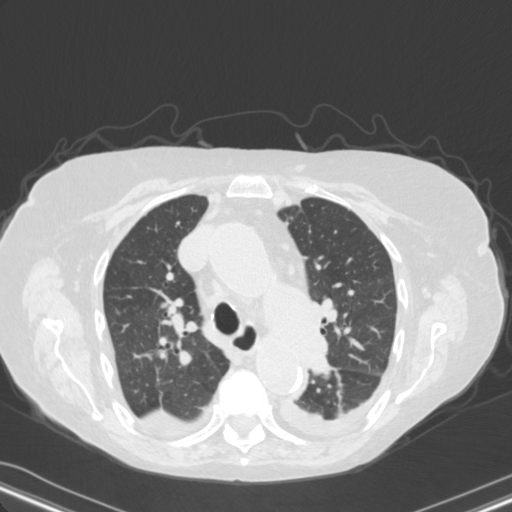
[im 102/138  lung]
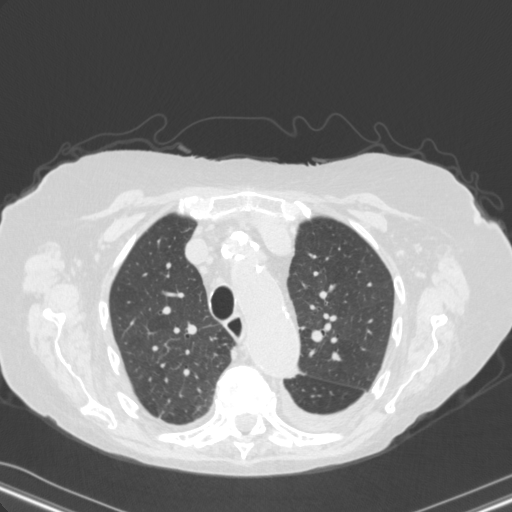
[im 110/138  mediastinal]
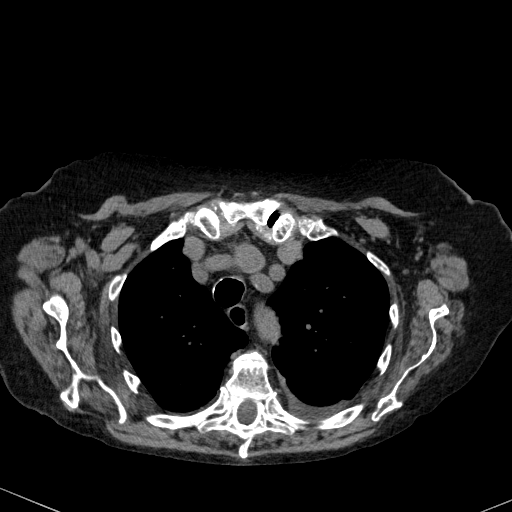
[im 110/138  lung]
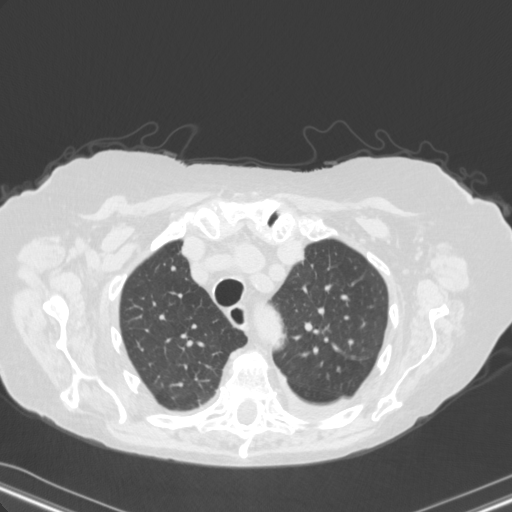
[im 117/138  lung]
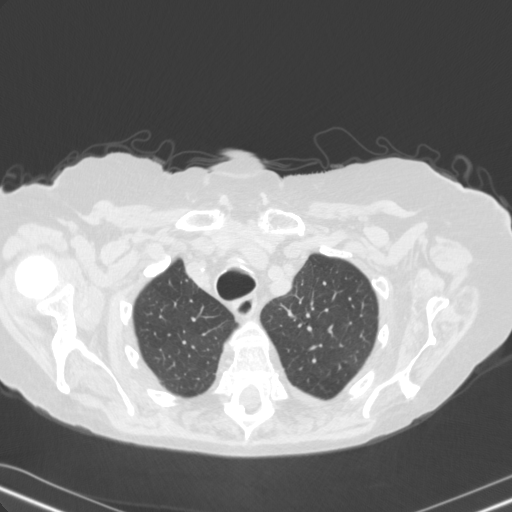
[im 127/138  lung]
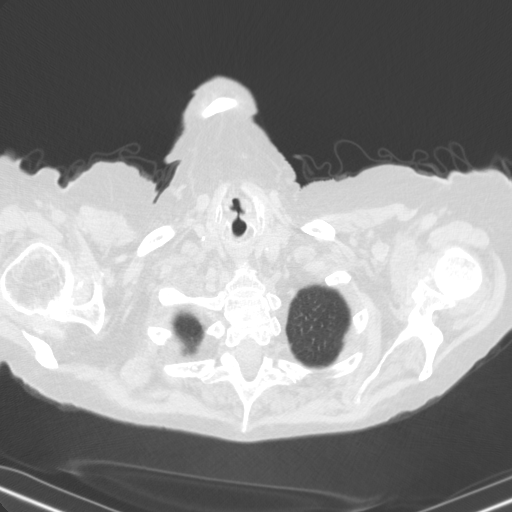

[15 of 34 positions shown; findings below may reference images not displayed]

FINDINGS: Cardiovascular: A large pericardial effusion is seen.
Mild-to-moderate cardiomegaly is noted. Aortic and coronary
atherosclerotic calcification also demonstrated.

Mediastinum/Nodes: No masses or pathologically enlarged lymph nodes
identified on this unenhanced exam.

Lungs/Pleura: A small left pleural effusion is seen, with
atelectasis in the dependent left lower lobe. Tiny right pleural
effusion versus pleural thickening is demonstrated, with mild right
lung parenchymal scarring. No evidence of pulmonary consolidation or
mass. No evidence of central airway obstruction.

Upper Abdomen:  Unremarkable.

Musculoskeletal: No suspicious bone lesions. Wedge compression
fracture deformities of the T1 and T2 vertebral bodies are seen
which appear old.
IMPRESSION: Large pericardial effusion.

Small left pleural effusion and mild left lower lobe atelectasis.

Right lung scarring and tiny right pleural effusion versus pleural
thickening.

Aortic Atherosclerosis ([id number]). Aortic atherosclerotic
calcification noted.

## 2021-07-15 IMAGING — DX DG CHEST 1V PORT
1 series · 1 of 1 positions shown · non-contrast
Comparison: April 19, 2020.

CLINICAL DATA: Shortness of breath.

EXAM:
PORTABLE CHEST 1 VIEW

[chest ap]
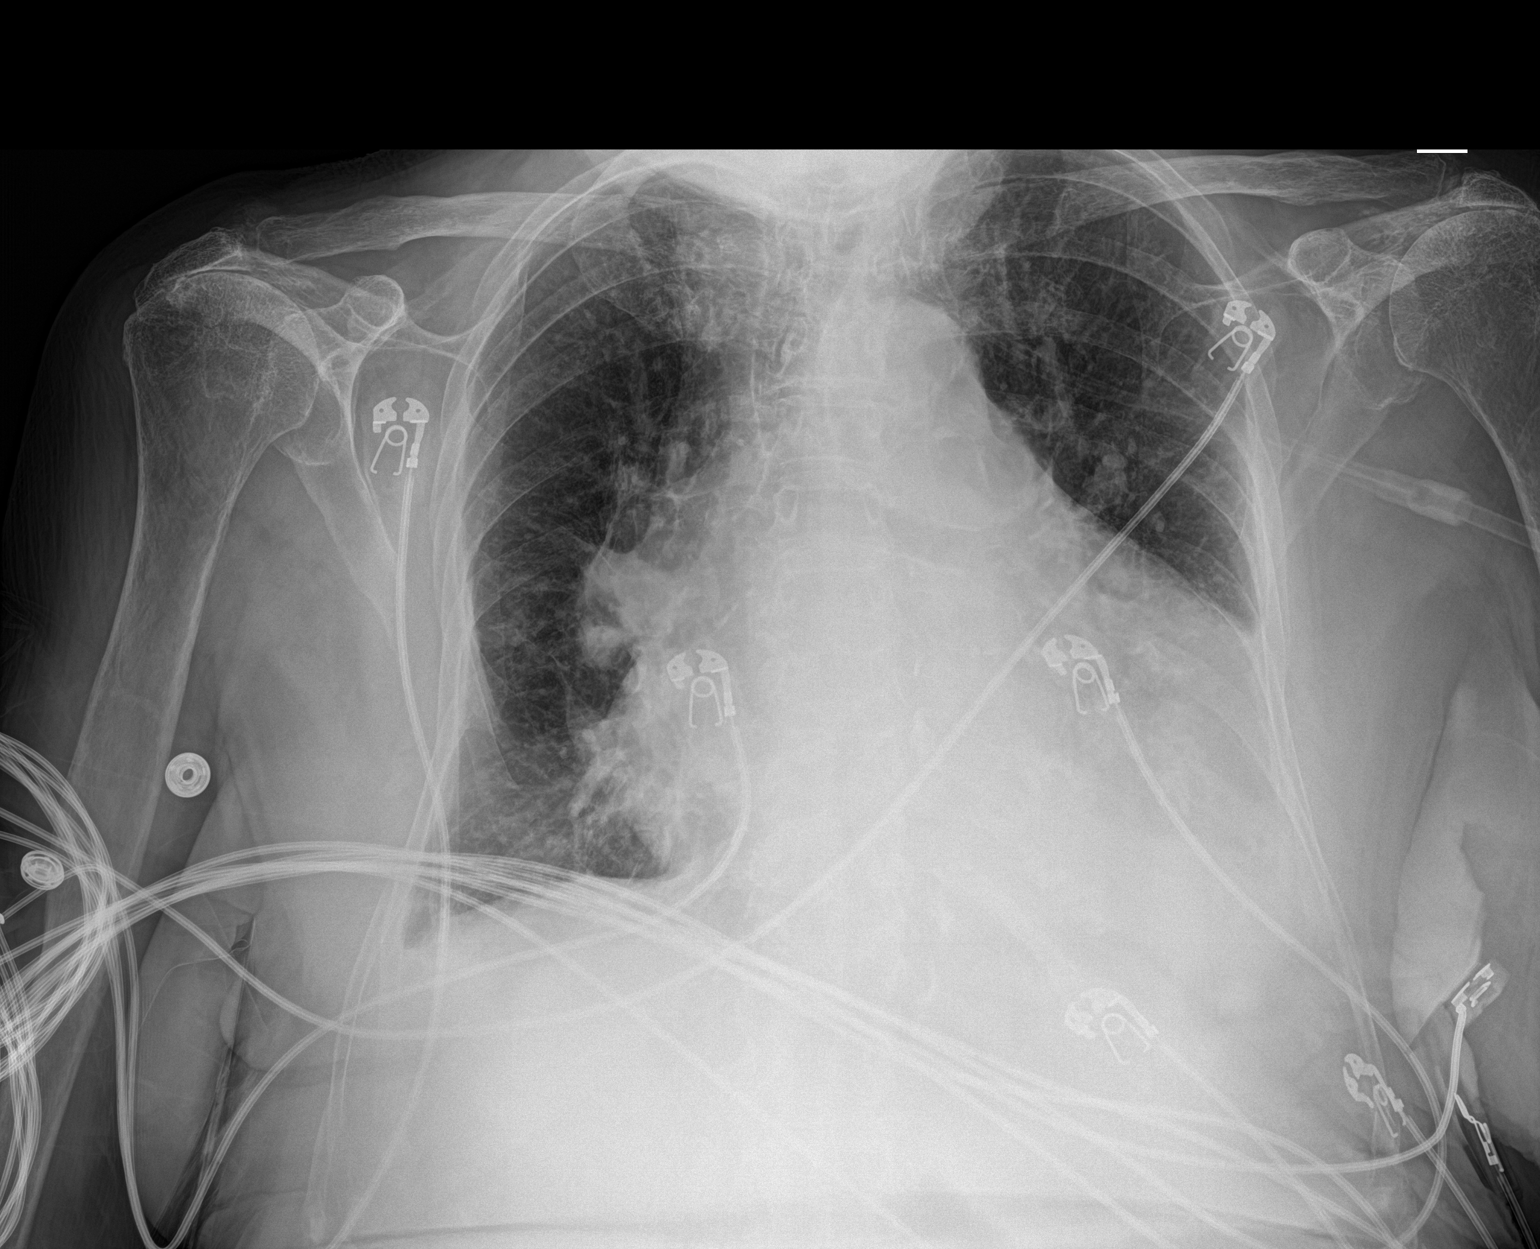

[1 of 1 positions shown; findings below may reference images not displayed]

FINDINGS: Stable cardiomegaly. No pneumothorax or pleural effusion is noted.
Lungs are clear. Bony thorax is unremarkable.
IMPRESSION: No active disease.

## 2021-07-15 IMAGING — CT CT ANGIO CHEST
2 of 6 series · 18 of 36 positions shown · IV contrast (omnipaque)
Comparison: 06/14/2020

CLINICAL DATA: Dyspnea, lethargy

EXAM:
CT ANGIOGRAPHY CHEST WITH CONTRAST
TECHNIQUE: Multidetector CT imaging of the chest was performed using the
standard protocol during bolus administration of intravenous
contrast. Multiplanar CT image reconstructions and MIPs were
obtained to evaluate the vascular anatomy.
CONTRAST:  75mL OMNIPAQUE IOHEXOL 350 MG/ML SOLN

[Series 8: pe thins · axial · 0.76mm/px · z∈[+963,+1228]mm · 17 of 422 slices shown]
[im 22/422  lung]
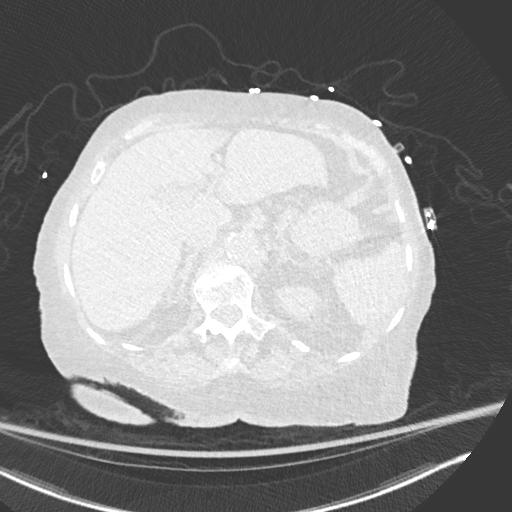
[im 43/422  mediastinal]
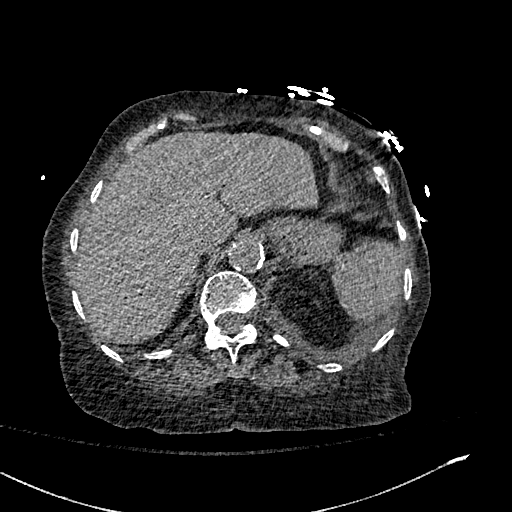
[im 64/422  lung]
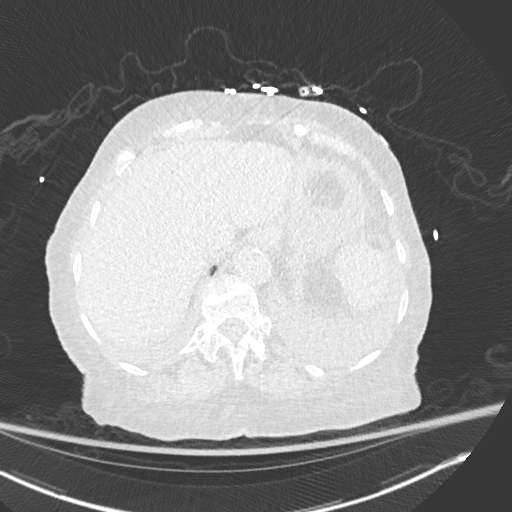
[im 85/422  mediastinal]
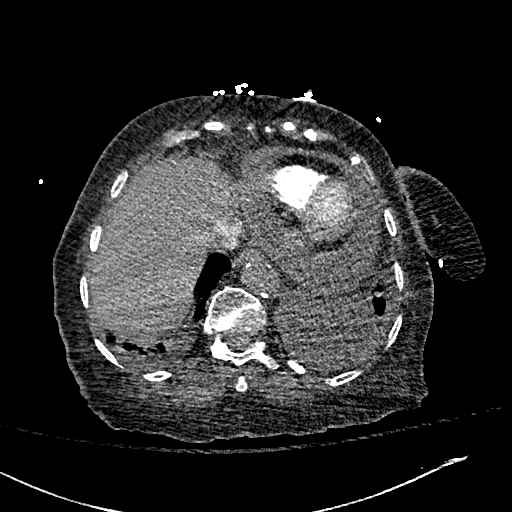
[im 127/422  lung]
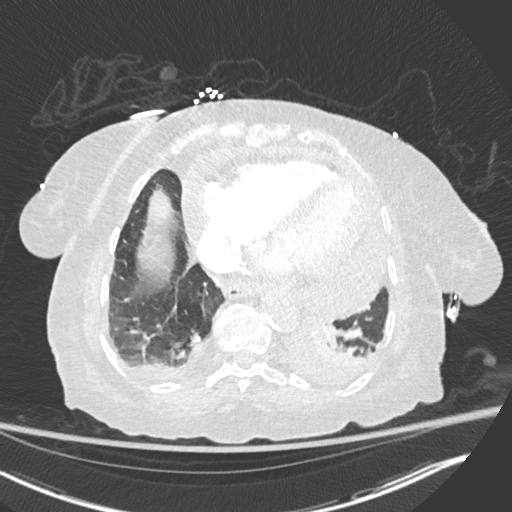
[im 148/422  mediastinal]
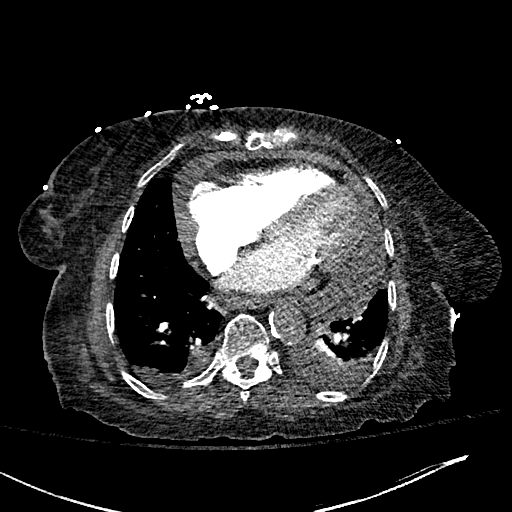
[im 169/422  lung]
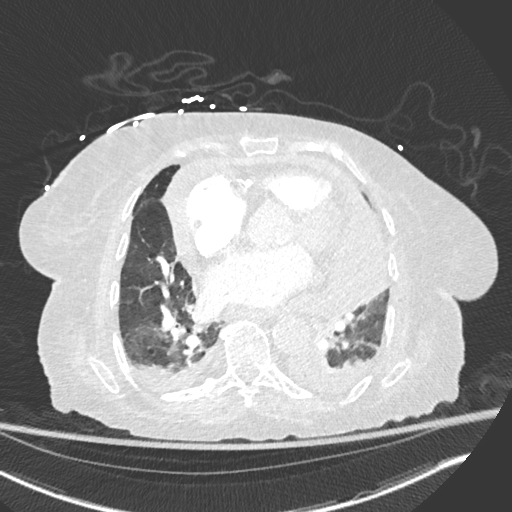
[im 190/422  mediastinal]
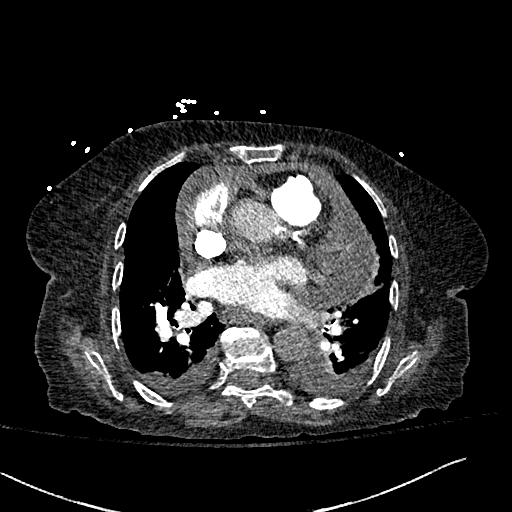
[im 211/422  lung]
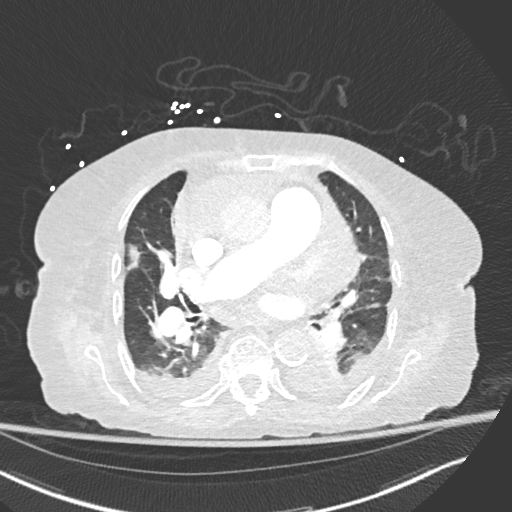
[im 232/422  mediastinal]
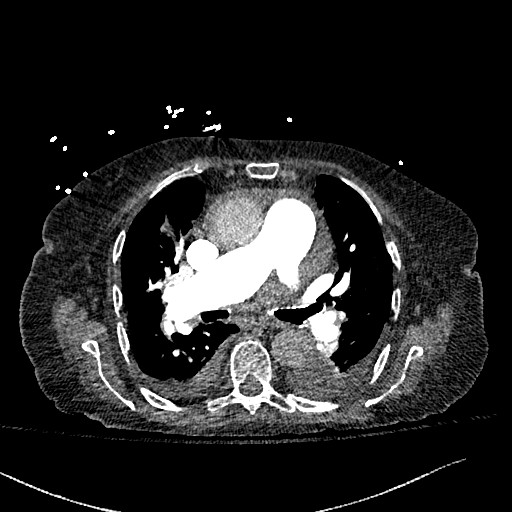
[im 253/422  lung]
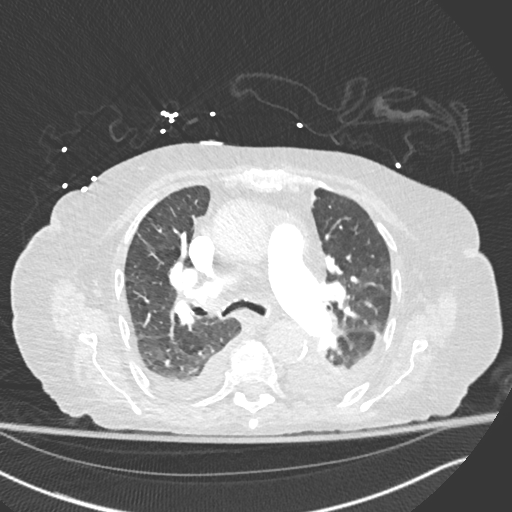
[im 274/422  mediastinal]
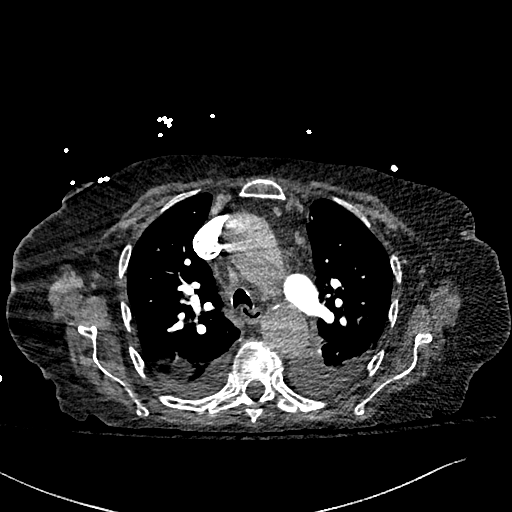
[im 295/422  lung]
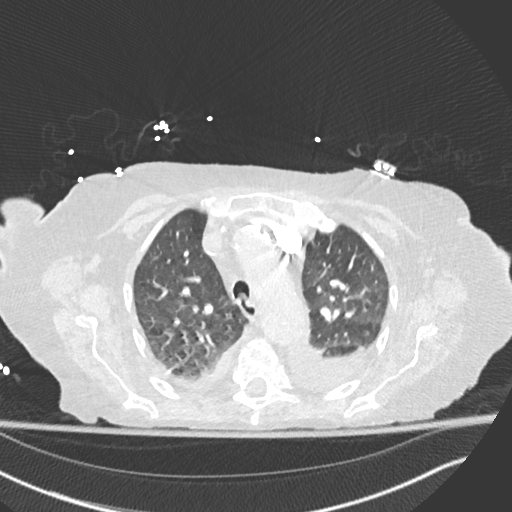
[im 337/422  mediastinal]
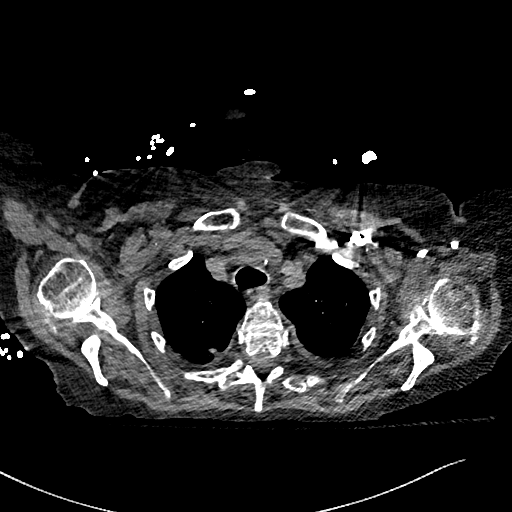
[im 358/422  lung]
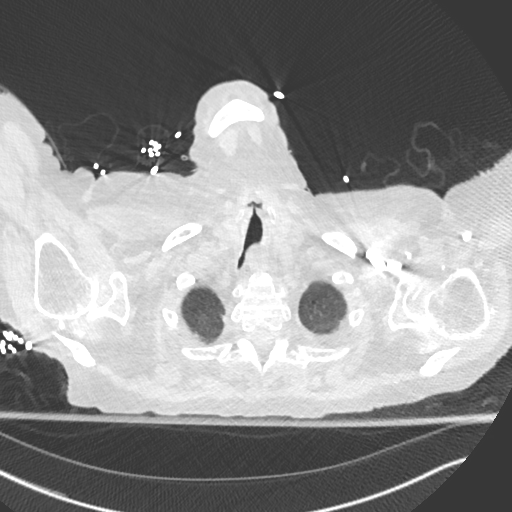
[im 379/422  mediastinal]
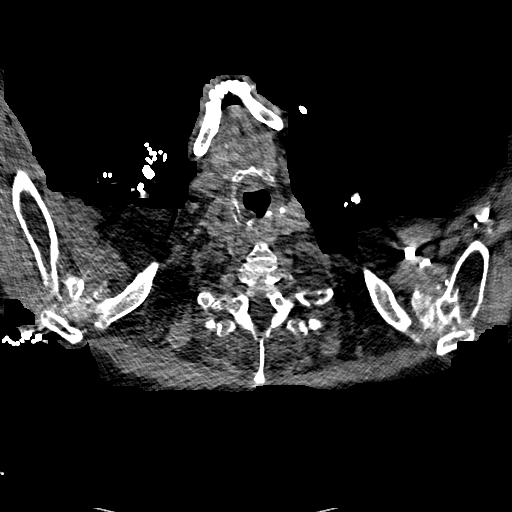
[im 400/422  lung]
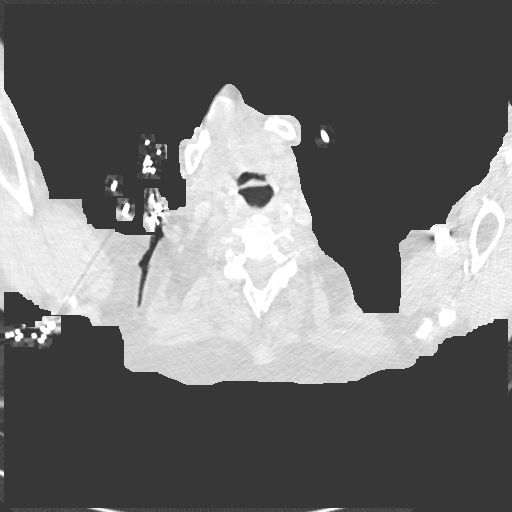

[Series 10: pe 2mm cor · coronal · 0.65mm/px · 1 of 151 slices shown]
[im 76/151  mediastinal]
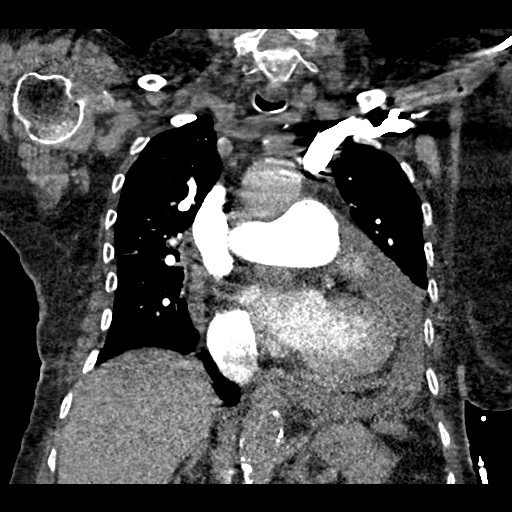

[18 of 36 positions shown; findings below may reference images not displayed]

FINDINGS: Cardiovascular: There is adequate opacification of the pulmonary
arterial tree. There is no intraluminal filling defect identified to
suggest acute pulmonary embolism. The central pulmonary arteries are
enlarged, similar to that noted on prior examination, in keeping
with changes of pulmonary arterial hypertension.

Extensive multi-vessel coronary artery calcification again noted.
Global cardiac size within normal limits. Large pericardial effusion
is again seen, unchanged from prior examination. Moderate
atherosclerotic calcification of the thoracic aorta again
identified. No aortic aneurysm.

Mediastinum/Nodes: No enlarged mediastinal, hilar, or axillary lymph
nodes. Thyroid gland, trachea, and esophagus demonstrate no
significant findings.

Lungs/Pleura: Small bilateral pleural effusions are present, new
since prior examination. Mild bibasilar associated compressive
atelectasis. No superimposed confluent pulmonary infiltrate. No
pneumothorax. Central airways are widely patent.

Upper Abdomen: No acute abnormality.

Musculoskeletal: Anterior wedge compression fractures of T4 and T5
are again seen, chronic in nature, with resultant focal thoracic
kyphosis.

Review of the MIP images confirms the above findings.
IMPRESSION: No pulmonary embolism.

Extensive multi-vessel coronary artery calcification.

Morphologic changes of pulmonary arterial hypertension.

Stable large pericardial effusion. Interval development of small
bilateral pleural effusions.

Aortic Atherosclerosis ([id number]).

## 2021-07-19 ENCOUNTER — Ambulatory Visit (INDEPENDENT_AMBULATORY_CARE_PROVIDER_SITE_OTHER): Payer: MEDICARE | Admitting: Podiatry

## 2021-07-19 ENCOUNTER — Encounter: Payer: Self-pay | Admitting: Podiatry

## 2021-07-19 ENCOUNTER — Other Ambulatory Visit: Payer: Self-pay

## 2021-07-19 DIAGNOSIS — J449 Chronic obstructive pulmonary disease, unspecified: Secondary | ICD-10-CM | POA: Insufficient documentation

## 2021-07-19 DIAGNOSIS — I2721 Secondary pulmonary arterial hypertension: Secondary | ICD-10-CM | POA: Insufficient documentation

## 2021-07-19 DIAGNOSIS — B351 Tinea unguium: Secondary | ICD-10-CM

## 2021-07-19 DIAGNOSIS — F419 Anxiety disorder, unspecified: Secondary | ICD-10-CM | POA: Insufficient documentation

## 2021-07-19 DIAGNOSIS — M79676 Pain in unspecified toe(s): Secondary | ICD-10-CM

## 2021-07-19 DIAGNOSIS — M2012 Hallux valgus (acquired), left foot: Secondary | ICD-10-CM

## 2021-07-19 DIAGNOSIS — M810 Age-related osteoporosis without current pathological fracture: Secondary | ICD-10-CM | POA: Insufficient documentation

## 2021-07-19 DIAGNOSIS — I340 Nonrheumatic mitral (valve) insufficiency: Secondary | ICD-10-CM | POA: Insufficient documentation

## 2021-07-19 DIAGNOSIS — I1310 Hypertensive heart and chronic kidney disease without heart failure, with stage 1 through stage 4 chronic kidney disease, or unspecified chronic kidney disease: Secondary | ICD-10-CM | POA: Insufficient documentation

## 2021-07-19 DIAGNOSIS — I700 Atherosclerosis of aorta: Secondary | ICD-10-CM | POA: Insufficient documentation

## 2021-07-19 DIAGNOSIS — I088 Other rheumatic multiple valve diseases: Secondary | ICD-10-CM | POA: Insufficient documentation

## 2021-07-19 DIAGNOSIS — M2011 Hallux valgus (acquired), right foot: Secondary | ICD-10-CM | POA: Diagnosis not present

## 2021-07-19 DIAGNOSIS — N1832 Chronic kidney disease, stage 3b: Secondary | ICD-10-CM | POA: Insufficient documentation

## 2021-07-19 DIAGNOSIS — E119 Type 2 diabetes mellitus without complications: Secondary | ICD-10-CM | POA: Diagnosis not present

## 2021-07-19 DIAGNOSIS — E1121 Type 2 diabetes mellitus with diabetic nephropathy: Secondary | ICD-10-CM | POA: Insufficient documentation

## 2021-07-19 DIAGNOSIS — I2510 Atherosclerotic heart disease of native coronary artery without angina pectoris: Secondary | ICD-10-CM | POA: Insufficient documentation

## 2021-07-19 DIAGNOSIS — J9811 Atelectasis: Secondary | ICD-10-CM | POA: Insufficient documentation

## 2021-07-19 DIAGNOSIS — E1142 Type 2 diabetes mellitus with diabetic polyneuropathy: Secondary | ICD-10-CM

## 2021-07-19 DIAGNOSIS — E7800 Pure hypercholesterolemia, unspecified: Secondary | ICD-10-CM | POA: Insufficient documentation

## 2021-07-26 NOTE — Progress Notes (Signed)
ANNUAL DIABETIC FOOT EXAM ? ?Subjective: ?Robin Mcclure presents today for annual diabetic foot examination. ? ?Patient relates 30 year h/o diabetes. ? ?Patient denies any h/o foot wounds. ? ?Patient endorses occasional numbness, tingling, burning, or pins/needle sensation in feet. ? ?Patient's blood sugar was 143 mg/dl today.  ? ?Risk factors: diabetic neuropathy, HTN, CKD, hyperlipidemia, diabetic renal disease. ? ?[name], [name], MD is patient's PCP. Last visit was June 01, 2021. ? ?Robin Mcclure is accompanied by her daughter on today's visit. ? ?Past Medical History:  ?Diagnosis Date  ? Diabetes mellitus without complication ([place])   ? Hyperlipidemia   ? Hypertension   ? ?Patient Active Problem List  ? Diagnosis Date Noted  ? Age-related osteoporosis without current pathological fracture 07/19/2021  ? Anxiety 07/19/2021  ? Arteriosclerosis of aorta ([place]) 07/19/2021  ? Atelectasis 07/19/2021  ? Atherosclerotic heart disease of native coronary artery without angina pectoris 07/19/2021  ? Chronic kidney disease, stage 3b ([place]) 07/19/2021  ? Chronic obstructive pulmonary disease ([place]) 07/19/2021  ? Diabetic renal disease ([place]) 07/19/2021  ? Hypertensive cardiovascular-renal disease, stage 1-4 or unspecified chronic kidney disease, without heart failure 07/19/2021  ? Hypertensive pulmonary arterial disease ([place]) 07/19/2021  ? Non-rheumatic mitral regurgitation 07/19/2021  ? Polyneuropathy due to type 2 diabetes mellitus ([place]) 07/19/2021  ? Other rheumatic multiple valve diseases 07/19/2021  ? Pure hypercholesterolemia 07/19/2021  ? Gastroesophageal reflux disease   ? Dementia without behavioral disturbance ([place])   ? Healthcare-associated pneumonia 08/18/2020  ? Diabetes ([place]) 08/18/2020  ? Essential hypertension 08/18/2020  ? Hypokalemia 08/18/2020  ? Pericarditis 06/20/2020  ? Pericardial effusion   ? Presbycusis of both ears 09/29/2018  ? ?Past Surgical History:  ?Procedure Laterality Date  ? ABDOMINAL HYSTERECTOMY     ? BIOPSY  01/19/2018  ? Procedure: BIOPSY;  Surgeon: Ronnette Juniper, MD;  Location: [place];  Service: Gastroenterology;;  ? CHOLECYSTECTOMY    ? ESOPHAGOGASTRODUODENOSCOPY (EGD) WITH PROPOFOL N/A 01/19/2018  ? Procedure: ESOPHAGOGASTRODUODENOSCOPY (EGD) WITH PROPOFOL;  Surgeon: Ronnette Juniper, MD;  Location: [place];  Service: Gastroenterology;  Laterality: N/A;  ? HIP SURGERY    ? ?Current Outpatient Medications on File Prior to Visit  ?Medication Sig Dispense Refill  ? acetaminophen (TYLENOL) 325 MG tablet Take 650 mg by mouth every 6 (six) hours as needed for mild pain.    ? albuterol (PROVENTIL HFA;VENTOLIN HFA) 108 (90 Base) MCG/ACT inhaler Inhale 1-2 puffs into the lungs every 6 (six) hours as needed for wheezing or shortness of breath.    ? B Complex Vitamins (B COMPLEX PO) Take 1 tablet by mouth daily.    ? Cholecalciferol 25 MCG (1000 UT) tablet Take 1,000 Units by mouth daily.     ? dextromethorphan (DELSYM) 30 MG/5ML liquid Take 2.5 mLs (15 mg total) by mouth every 6 (six) hours as needed for cough. 148 mL 0  ? Fluticasone-Salmeterol (ADVAIR) 500-50 MCG/DOSE AEPB Inhale 1 puff into the lungs 2 (two) times daily.    ? furosemide (LASIX) 40 MG tablet Take 40 mg by mouth daily.    ? glipiZIDE (GLUCOTROL XL) 2.5 MG 24 hr tablet Take 2.5 mg by mouth daily with breakfast.    ? ibuprofen (ADVIL) 600 MG tablet Take 1 tablet (600 mg total) by mouth every 12 (twelve) hours.    ? metFORMIN (GLUCOPHAGE) 500 MG tablet Take 500 mg by mouth 2 (two) times daily.    ? Multiple Minerals (CALCIUM/MAGNESIUM/ZINC PO) Take 1 tablet by mouth 2 (two) times daily.    ?  Multiple Vitamins-Minerals ([employer] SPECTRAVITE SENIOR PO) Take 1 tablet by mouth daily.     ? NIFEdipine (ADALAT CC) 90 MG 24 hr tablet Take 90 mg by mouth every morning.    ? ondansetron (ZOFRAN) 4 MG tablet Take 4 mg by mouth every 8 (eight) hours as needed for nausea.  0  ? OXYGEN Inhale 4 L into the lungs daily.    ? pantoprazole (PROTONIX) 40 MG tablet Take 40  mg by mouth every morning.  3  ? polyethylene glycol powder (MIRALAX) 17 GM/SCOOP powder See admin instructions.    ? Potassium Chloride ER 20 MEQ TBCR Take 1 tablet by mouth 2 (two) times daily.    ? pravastatin (PRAVACHOL) 20 MG tablet Take 20 mg by mouth every morning.    ? sitaGLIPtin (JANUVIA) 50 MG tablet Take 1 tablet (50 mg total) by mouth daily. (Patient taking differently: Take 50 mg by mouth every morning.) 30 tablet 0  ? sucralfate (CARAFATE) 1 GM/10ML suspension Take 10 mLs (1 g total) by mouth 2 (two) times daily.    ? ?No current facility-administered medications on file prior to visit.  ?  ?No Known Allergies ?Social History  ? ?Occupational History  ? Not on file  ?Tobacco Use  ? Smoking status: Never  ? Smokeless tobacco: Never  ?Substance and Sexual Activity  ? Alcohol use: No  ? Drug use: No  ? Sexual activity: Not on file  ? ?History reviewed. No pertinent family history. ? ?There is no immunization history on file for this patient.  ? ?Review of Systems: Negative except as noted in the HPI.  ? ?Objective: ?There were no vitals filed for this visit. ? ?Robin Mcclure is a pleasant 88 y.o. female in NAD. AAO X 3. ? ?Vascular Examination: ?CFT <3 seconds b/l LE. Palpable pedal pulses b/l LE. Pedal hair absent. No pain with calf compression b/l. Lower extremity skin temperature gradient within normal limits. No edema noted b/l LE. No cyanosis or clubbing noted b/l LE. ? ?Dermatological Examination: ?Pedal skin thin, shiny and atrophic b/l LE. No open wounds b/l LE. No interdigital macerations noted b/l LE. Toenails 1-5 b/l elongated, discolored, dystrophic, thickened, crumbly with subungual debris and tenderness to dorsal palpation. No hyperkeratotic nor porokeratotic lesions present on today's visit. ? ?Neurological Examination: ?Pt has subjective symptoms of neuropathy. Protective sensation intact 5/5 intact bilaterally with 10g monofilament b/l. Vibratory sensation intact b/l. ? ?Musculoskeletal  Examination: ?Muscle strength 5/5 to all lower extremity muscle groups bilaterally. HAV with bunion deformity noted b/l LE. Utilizes wheelchair for mobility assistance. ? ?Footwear Assessment: ?Does the patient wear appropriate shoes? Yes. ?Does the patient need inserts/orthotics? No. ? ?Assessment: ?1. Pain due to onychomycosis of toenail   ?2. Hallux valgus, acquired, bilateral   ?3. Diabetic peripheral neuropathy associated with type 2 diabetes mellitus ([place])   ?4. Encounter for diabetic foot exam ([place])   ?  ?ADA Risk Categorization: ?Low Risk :  ?Patient has all of the following: ?Intact protective sensation ?No prior foot ulcer  ?No severe deformity ?Pedal pulses present ? ?Plan: ?-Diabetic foot examination performed today. ?-Continue foot and shoe inspections daily. Monitor blood glucose per PCP/Endocrinologist's recommendations. ?-Toenails 1-5 bilaterally were debrided in length and girth with sterile nail nippers and dremel. Pinpoint bleeding of L 3rd toe addressed with Lumicain Hemostatic Solution, cleansed with alcohol. triple antibiotic ointment applied. Patient instructed to apply Neosporin Cream once daily for 7 days. ?-Patient/POA to call should there be question/concern in the interim. ?Return  in about 3 months (around 10/19/2021). ? ?Marzetta Board, DPM ?

## 2021-08-02 ENCOUNTER — Telehealth: Payer: Self-pay

## 2021-08-02 NOTE — Telephone Encounter (Signed)
Dr. Elisha Ponder received a written letter in the mail from patient's daughter that stated as follows: "On 05/24/21 all of mom's bloodwork was "within normal range' except her A1C. Her A1C was 8.3." Letter was mailed 07/24/21. ?

## 2021-09-12 IMAGING — DX DG CHEST 1V PORT
1 series · 1 of 1 positions shown · non-contrast
Comparison: June 20, 2020 chest radiograph and chest CT

CLINICAL DATA: Shortness of breath with decreased oxygen saturation

EXAM:
PORTABLE CHEST 1 VIEW

[chest ap]
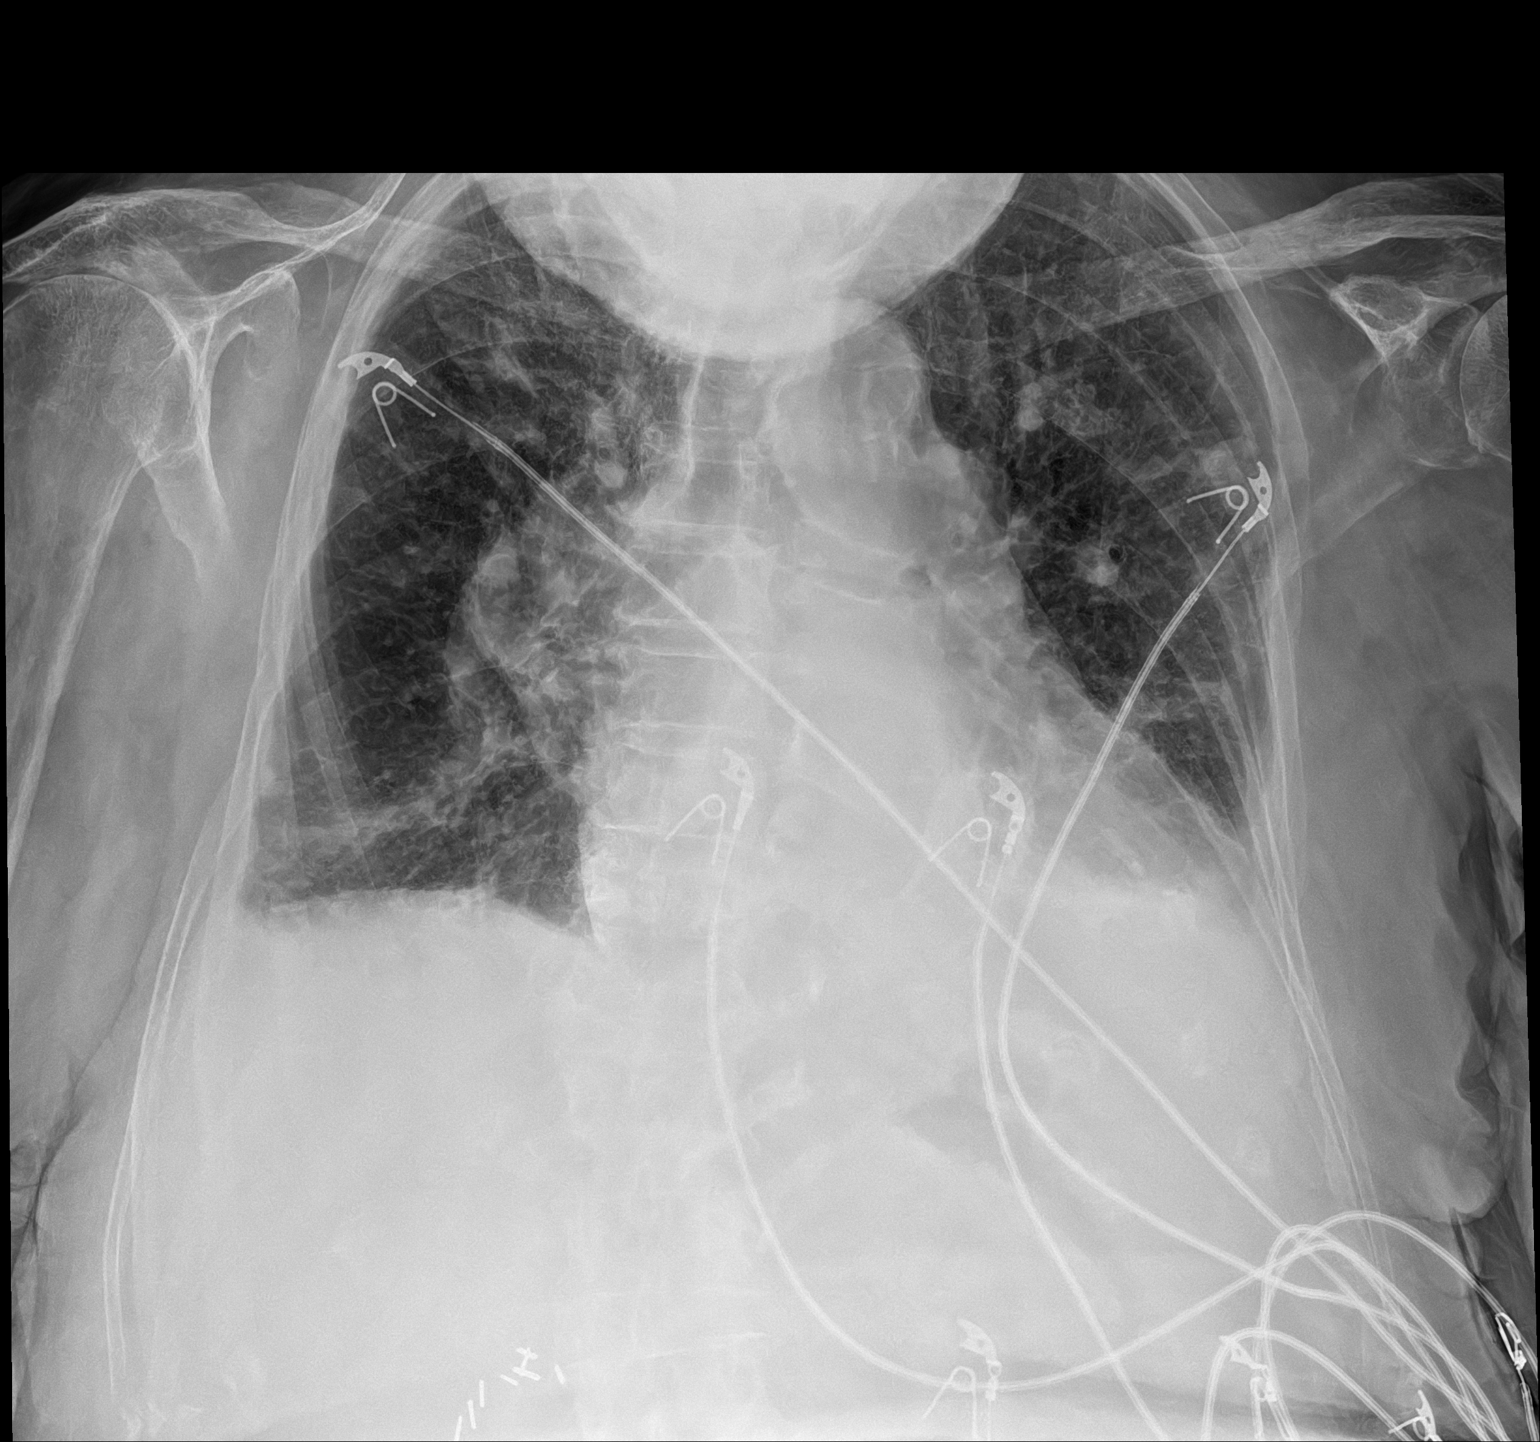

[1 of 1 positions shown; findings below may reference images not displayed]

FINDINGS: There is ill-defined airspace opacity in each lung base with
equivocal pleural effusions bilaterally. There is consolidation more
medially in the left base. Heart is mildly enlarged with pulmonary
vascularity normal. No adenopathy. Bones are osteoporotic. There is
aortic atherosclerosis.
IMPRESSION: Suspected pneumonia medial left base. Atelectatic change with
questionable developing pneumonia elsewhere in the lung bases.
Equivocal pleural effusions bilaterally. There is a degree of
cardiomegaly. Bones osteoporotic. Aortic Atherosclerosis
([id number].I).

## 2021-10-09 DIAGNOSIS — H90A21 Sensorineural hearing loss, unilateral, right ear, with restricted hearing on the contralateral side: Secondary | ICD-10-CM | POA: Diagnosis not present

## 2021-10-09 DIAGNOSIS — H90A32 Mixed conductive and sensorineural hearing loss, unilateral, left ear with restricted hearing on the contralateral side: Secondary | ICD-10-CM | POA: Diagnosis not present

## 2021-10-23 ENCOUNTER — Ambulatory Visit (INDEPENDENT_AMBULATORY_CARE_PROVIDER_SITE_OTHER): Payer: MEDICARE | Admitting: Podiatry

## 2021-10-23 DIAGNOSIS — M79676 Pain in unspecified toe(s): Secondary | ICD-10-CM | POA: Diagnosis not present

## 2021-10-23 DIAGNOSIS — E1142 Type 2 diabetes mellitus with diabetic polyneuropathy: Secondary | ICD-10-CM

## 2021-10-23 DIAGNOSIS — B351 Tinea unguium: Secondary | ICD-10-CM

## 2021-10-29 ENCOUNTER — Encounter: Payer: Self-pay | Admitting: Podiatry

## 2021-10-29 DIAGNOSIS — H9190 Unspecified hearing loss, unspecified ear: Secondary | ICD-10-CM | POA: Insufficient documentation

## 2021-10-29 NOTE — Progress Notes (Signed)
  Subjective:  Patient ID: Robin Mcclure, female    DOB: 1923/02/11,  MRN: [id number]  Robin Mcclure presents to clinic today for at risk foot care with history of diabetic neuropathy and painful thick toenails that are difficult to trim. Pain interferes with ambulation. Aggravating factors include wearing enclosed shoe gear. Pain is relieved with periodic professional debridement.  Patient states blood glucose was 138 mg/dl today.  Last known HgA1c was unknown.  New problem(s): None.   She is accompanied by her daughter, Robin Mcclure, on today's visit.  PCP is [name], [name], MD , and last visit was June 01, 2021.  No Known Allergies  Review of Systems: Negative except as noted in the HPI.  Objective: No changes noted in today's physical examination. There were no vitals filed for this visit.  Robin Mcclure is a pleasant 88 y.o. female in NAD. AAO X 3.  Vascular Examination: CFT <3 seconds b/l LE. Palpable pedal pulses b/l LE. Pedal hair absent. No pain with calf compression b/l. Lower extremity skin temperature gradient within normal limits. No edema noted b/l LE. No cyanosis or clubbing noted b/l LE.  Dermatological Examination: Pedal skin thin, shiny and atrophic b/l LE. No open wounds b/l LE. No interdigital macerations noted b/l LE. Toenails 1-5 b/l elongated, discolored, dystrophic, thickened, crumbly with subungual debris and tenderness to dorsal palpation. No hyperkeratotic nor porokeratotic lesions present on today's visit.  Neurological Examination: Pt has subjective symptoms of neuropathy. Protective sensation intact 5/5 intact bilaterally with 10g monofilament b/l. Vibratory sensation intact b/l.  Musculoskeletal Examination: Muscle strength 5/5 to all lower extremity muscle groups bilaterally. HAV with bunion deformity noted b/l LE. Utilizes wheelchair for mobility assistance.  Assessment/Plan: 1. Pain due to onychomycosis of toenail   2. Diabetic peripheral neuropathy  associated with type 2 diabetes mellitus ([place])      -Patient was evaluated and treated. All patient's and/or POA's questions/concerns answered on today's visit. -Continue foot and shoe inspections daily. Monitor blood glucose per PCP/Endocrinologist's recommendations. -Patient to continue soft, supportive shoe gear daily. -Mycotic toenails 1-5 bilaterally were debrided in length and girth with sterile nail nippers and dremel without incident. -Patient/POA to call should there be question/concern in the interim.   Return in about 3 months (around 01/23/2022).  Marzetta Board, DPM

## 2022-01-24 DIAGNOSIS — N1832 Chronic kidney disease, stage 3b: Secondary | ICD-10-CM | POA: Diagnosis not present

## 2022-01-24 DIAGNOSIS — Z23 Encounter for immunization: Secondary | ICD-10-CM | POA: Diagnosis not present

## 2022-01-24 DIAGNOSIS — I10 Essential (primary) hypertension: Secondary | ICD-10-CM | POA: Diagnosis not present

## 2022-01-24 DIAGNOSIS — I700 Atherosclerosis of aorta: Secondary | ICD-10-CM | POA: Diagnosis not present

## 2022-01-24 DIAGNOSIS — E1142 Type 2 diabetes mellitus with diabetic polyneuropathy: Secondary | ICD-10-CM | POA: Diagnosis not present

## 2022-01-24 DIAGNOSIS — Z0000 Encounter for general adult medical examination without abnormal findings: Secondary | ICD-10-CM | POA: Diagnosis not present

## 2022-01-24 DIAGNOSIS — Z79899 Other long term (current) drug therapy: Secondary | ICD-10-CM | POA: Diagnosis not present

## 2022-01-24 DIAGNOSIS — F419 Anxiety disorder, unspecified: Secondary | ICD-10-CM | POA: Diagnosis not present

## 2022-01-24 DIAGNOSIS — E7800 Pure hypercholesterolemia, unspecified: Secondary | ICD-10-CM | POA: Diagnosis not present

## 2022-01-24 DIAGNOSIS — E1169 Type 2 diabetes mellitus with other specified complication: Secondary | ICD-10-CM | POA: Diagnosis not present

## 2022-01-24 DIAGNOSIS — J449 Chronic obstructive pulmonary disease, unspecified: Secondary | ICD-10-CM | POA: Diagnosis not present

## 2022-01-24 DIAGNOSIS — I2510 Atherosclerotic heart disease of native coronary artery without angina pectoris: Secondary | ICD-10-CM | POA: Diagnosis not present

## 2022-01-26 DIAGNOSIS — E1142 Type 2 diabetes mellitus with diabetic polyneuropathy: Secondary | ICD-10-CM | POA: Diagnosis not present

## 2022-01-31 ENCOUNTER — Encounter: Payer: Self-pay | Admitting: Podiatry

## 2022-01-31 ENCOUNTER — Ambulatory Visit (INDEPENDENT_AMBULATORY_CARE_PROVIDER_SITE_OTHER): Payer: MEDICARE | Admitting: Podiatry

## 2022-01-31 DIAGNOSIS — B351 Tinea unguium: Secondary | ICD-10-CM

## 2022-01-31 DIAGNOSIS — E1142 Type 2 diabetes mellitus with diabetic polyneuropathy: Secondary | ICD-10-CM

## 2022-01-31 DIAGNOSIS — M79676 Pain in unspecified toe(s): Secondary | ICD-10-CM

## 2022-02-04 NOTE — Progress Notes (Signed)
  Subjective:  Patient ID: Robin Mcclure, female    DOB: [birth year],  MRN: [id number]  88 y.o. female presents at risk foot care with history of diabetic neuropathy and painful elongated mycotic toenails 1-5 bilaterally which are tender when wearing enclosed shoe gear. Pain is relieved with periodic professional debridement.  Patient states blood glucose was 137 mg/dl today. Last known  HgA1c was 6.6%.    She is accompanied by her daughter on today's visit.  New problem(s): None   PCP is [name], [name], MD , and last visit was June 01, 2021  No Known Allergies  Review of Systems: Negative except as noted in the HPI.   Objective:  Ms. Whetsel is a pleasant 88 y.o. female, WD, WN in NAD. AAO x 3.  Vascular Examination: CFT <3 seconds b/l LE. Palpable DP pulse(s) b/l LE. Palpable PT pulse(s) b/l LE. Pedal hair absent. No pain with calf compression b/l. No edema noted b/l LE. No cyanosis or clubbing noted b/l LE.  Neurological Examination: Sensation grossly intact b/l with 10 gram monofilament. Vibratory sensation intact b/l. Pt has subjective symptoms of neuropathy.  Dermatological Examination: Pedal skin thin, shiny and atrophic b/l LE. No open wounds b/l LE. No interdigital macerations noted b/l LE. Toenails 1-5 b/l elongated, discolored, dystrophic, thickened, crumbly with subungual debris and tenderness to dorsal palpation. No hyperkeratotic nor porokeratotic lesions present on today's visit.  Musculoskeletal Examination: Muscle strength 5/5 to b/l LE. HAV with bunion deformity noted b/l LE. Utilizes wheelchair for mobility assistance.  Radiographs: None Assessment:   1. Pain due to onychomycosis of toenail   2. Diabetic peripheral neuropathy associated with type 2 diabetes mellitus ([place])    Plan:  -Examined patient. -No new findings. No new orders. -Continue foot and shoe inspections daily. Monitor blood glucose per PCP/Endocrinologist's recommendations. -Toenails 1-5 b/l  were debrided in length and girth with sterile nail nippers and dremel without iatrogenic bleeding.  -Patient/POA to call should there be question/concern in the interim.  Return in about 3 months (around 05/02/2022).  Marzetta Board, DPM

## 2022-02-21 ENCOUNTER — Emergency Department (HOSPITAL_COMMUNITY): Payer: MEDICARE

## 2022-02-21 ENCOUNTER — Other Ambulatory Visit: Payer: Self-pay

## 2022-02-21 ENCOUNTER — Encounter (HOSPITAL_COMMUNITY): Payer: Self-pay

## 2022-02-21 ENCOUNTER — Inpatient Hospital Stay (HOSPITAL_COMMUNITY)
Admission: EM | Admit: 2022-02-21 | Discharge: 2022-03-05 | DRG: 388 | Disposition: A | Discharge status: Skilled Nursing Facility | Payer: MEDICARE | Attending: Family Medicine | Admitting: Family Medicine

## 2022-02-21 DIAGNOSIS — W44F3XA Food entering into or through a natural orifice, initial encounter: Secondary | ICD-10-CM

## 2022-02-21 DIAGNOSIS — Z9049 Acquired absence of other specified parts of digestive tract: Secondary | ICD-10-CM

## 2022-02-21 DIAGNOSIS — R64 Cachexia: Secondary | ICD-10-CM | POA: Diagnosis present

## 2022-02-21 DIAGNOSIS — E860 Dehydration: Secondary | ICD-10-CM | POA: Diagnosis not present

## 2022-02-21 DIAGNOSIS — E878 Other disorders of electrolyte and fluid balance, not elsewhere classified: Secondary | ICD-10-CM | POA: Diagnosis present

## 2022-02-21 DIAGNOSIS — Z79899 Other long term (current) drug therapy: Secondary | ICD-10-CM

## 2022-02-21 DIAGNOSIS — J690 Pneumonitis due to inhalation of food and vomit: Secondary | ICD-10-CM | POA: Diagnosis not present

## 2022-02-21 DIAGNOSIS — K219 Gastro-esophageal reflux disease without esophagitis: Secondary | ICD-10-CM | POA: Diagnosis present

## 2022-02-21 DIAGNOSIS — K56600 Partial intestinal obstruction, unspecified as to cause: Secondary | ICD-10-CM | POA: Diagnosis present

## 2022-02-21 DIAGNOSIS — R69 Illness, unspecified: Secondary | ICD-10-CM | POA: Diagnosis not present

## 2022-02-21 DIAGNOSIS — E8809 Other disorders of plasma-protein metabolism, not elsewhere classified: Secondary | ICD-10-CM | POA: Diagnosis not present

## 2022-02-21 DIAGNOSIS — E46 Unspecified protein-calorie malnutrition: Secondary | ICD-10-CM

## 2022-02-21 DIAGNOSIS — E441 Mild protein-calorie malnutrition: Secondary | ICD-10-CM | POA: Diagnosis present

## 2022-02-21 DIAGNOSIS — E876 Hypokalemia: Secondary | ICD-10-CM | POA: Diagnosis not present

## 2022-02-21 DIAGNOSIS — K56609 Unspecified intestinal obstruction, unspecified as to partial versus complete obstruction: Principal | ICD-10-CM

## 2022-02-21 DIAGNOSIS — I517 Cardiomegaly: Secondary | ICD-10-CM | POA: Diagnosis not present

## 2022-02-21 DIAGNOSIS — J9601 Acute respiratory failure with hypoxia: Secondary | ICD-10-CM | POA: Diagnosis not present

## 2022-02-21 DIAGNOSIS — Z7401 Bed confinement status: Secondary | ICD-10-CM | POA: Diagnosis not present

## 2022-02-21 DIAGNOSIS — R0602 Shortness of breath: Secondary | ICD-10-CM | POA: Diagnosis not present

## 2022-02-21 DIAGNOSIS — J90 Pleural effusion, not elsewhere classified: Secondary | ICD-10-CM | POA: Diagnosis not present

## 2022-02-21 DIAGNOSIS — K6389 Other specified diseases of intestine: Secondary | ICD-10-CM | POA: Diagnosis not present

## 2022-02-21 DIAGNOSIS — Z7984 Long term (current) use of oral hypoglycemic drugs: Secondary | ICD-10-CM

## 2022-02-21 DIAGNOSIS — I10 Essential (primary) hypertension: Secondary | ICD-10-CM | POA: Diagnosis present

## 2022-02-21 DIAGNOSIS — Z85038 Personal history of other malignant neoplasm of large intestine: Secondary | ICD-10-CM

## 2022-02-21 DIAGNOSIS — Z7951 Long term (current) use of inhaled steroids: Secondary | ICD-10-CM

## 2022-02-21 DIAGNOSIS — Z9889 Other specified postprocedural states: Secondary | ICD-10-CM | POA: Diagnosis not present

## 2022-02-21 DIAGNOSIS — E872 Acidosis, unspecified: Secondary | ICD-10-CM | POA: Diagnosis not present

## 2022-02-21 DIAGNOSIS — Z90710 Acquired absence of both cervix and uterus: Secondary | ICD-10-CM | POA: Diagnosis not present

## 2022-02-21 DIAGNOSIS — R1310 Dysphagia, unspecified: Secondary | ICD-10-CM | POA: Diagnosis not present

## 2022-02-21 DIAGNOSIS — M47814 Spondylosis without myelopathy or radiculopathy, thoracic region: Secondary | ICD-10-CM | POA: Diagnosis not present

## 2022-02-21 DIAGNOSIS — M7989 Other specified soft tissue disorders: Secondary | ICD-10-CM | POA: Diagnosis not present

## 2022-02-21 DIAGNOSIS — E782 Mixed hyperlipidemia: Secondary | ICD-10-CM

## 2022-02-21 DIAGNOSIS — J984 Other disorders of lung: Secondary | ICD-10-CM | POA: Diagnosis not present

## 2022-02-21 DIAGNOSIS — N139 Obstructive and reflux uropathy, unspecified: Secondary | ICD-10-CM | POA: Diagnosis not present

## 2022-02-21 DIAGNOSIS — Z6823 Body mass index (BMI) 23.0-23.9, adult: Secondary | ICD-10-CM

## 2022-02-21 DIAGNOSIS — Z7189 Other specified counseling: Secondary | ICD-10-CM | POA: Diagnosis not present

## 2022-02-21 DIAGNOSIS — K5730 Diverticulosis of large intestine without perforation or abscess without bleeding: Secondary | ICD-10-CM | POA: Diagnosis not present

## 2022-02-21 DIAGNOSIS — E870 Hyperosmolality and hypernatremia: Secondary | ICD-10-CM | POA: Diagnosis not present

## 2022-02-21 DIAGNOSIS — J449 Chronic obstructive pulmonary disease, unspecified: Secondary | ICD-10-CM | POA: Diagnosis not present

## 2022-02-21 DIAGNOSIS — R1111 Vomiting without nausea: Secondary | ICD-10-CM | POA: Diagnosis not present

## 2022-02-21 DIAGNOSIS — E1165 Type 2 diabetes mellitus with hyperglycemia: Secondary | ICD-10-CM | POA: Diagnosis not present

## 2022-02-21 DIAGNOSIS — S22089D Unspecified fracture of T11-T12 vertebra, subsequent encounter for fracture with routine healing: Secondary | ICD-10-CM | POA: Diagnosis not present

## 2022-02-21 DIAGNOSIS — R39198 Other difficulties with micturition: Secondary | ICD-10-CM | POA: Diagnosis not present

## 2022-02-21 DIAGNOSIS — Z515 Encounter for palliative care: Secondary | ICD-10-CM | POA: Diagnosis not present

## 2022-02-21 DIAGNOSIS — R5381 Other malaise: Secondary | ICD-10-CM | POA: Diagnosis not present

## 2022-02-21 DIAGNOSIS — R918 Other nonspecific abnormal finding of lung field: Secondary | ICD-10-CM | POA: Diagnosis not present

## 2022-02-21 DIAGNOSIS — J9811 Atelectasis: Secondary | ICD-10-CM | POA: Diagnosis not present

## 2022-02-21 DIAGNOSIS — Z452 Encounter for adjustment and management of vascular access device: Secondary | ICD-10-CM | POA: Diagnosis not present

## 2022-02-21 DIAGNOSIS — T17928A Food in respiratory tract, part unspecified causing other injury, initial encounter: Secondary | ICD-10-CM

## 2022-02-21 DIAGNOSIS — Z4682 Encounter for fitting and adjustment of non-vascular catheter: Secondary | ICD-10-CM | POA: Diagnosis not present

## 2022-02-21 DIAGNOSIS — R531 Weakness: Secondary | ICD-10-CM | POA: Diagnosis not present

## 2022-02-21 DIAGNOSIS — K8689 Other specified diseases of pancreas: Secondary | ICD-10-CM | POA: Diagnosis not present

## 2022-02-21 DIAGNOSIS — Z9981 Dependence on supplemental oxygen: Secondary | ICD-10-CM

## 2022-02-21 LAB — COMPREHENSIVE METABOLIC PANEL
ALT: 12 U/L (ref 0–44)
AST: 12 U/L — ABNORMAL LOW (ref 15–41)
Albumin: 3.2 g/dL — ABNORMAL LOW (ref 3.5–5.0)
Alkaline Phosphatase: 66 U/L (ref 38–126)
Anion gap: 12 (ref 5–15)
BUN: 28 mg/dL — ABNORMAL HIGH (ref 8–23)
CO2: 19 mmol/L — ABNORMAL LOW (ref 22–32)
Calcium: 9.6 mg/dL (ref 8.9–10.3)
Chloride: 107 mmol/L (ref 98–111)
Creatinine, Ser: 0.97 mg/dL (ref 0.44–1.00)
GFR, Estimated: 53 mL/min — ABNORMAL LOW (ref >60)
Glucose, Bld: 221 mg/dL — ABNORMAL HIGH (ref 70–99)
Potassium: 3.9 mmol/L (ref 3.5–5.1)
Sodium: 138 mmol/L (ref 135–145)
Total Bilirubin: 1.3 mg/dL — ABNORMAL HIGH (ref 0.3–1.2)
Total Protein: 6.6 g/dL (ref 6.5–8.1)

## 2022-02-21 LAB — CBC WITH DIFFERENTIAL/PLATELET
Abs Immature Granulocytes: 0.01 10*3/uL (ref 0.00–0.07)
Basophils Absolute: 0.1 10*3/uL (ref 0.0–0.1)
Basophils Relative: 1 %
Eosinophils Absolute: 0 10*3/uL (ref 0.0–0.5)
Eosinophils Relative: 0 %
HCT: 37.5 % (ref 36.0–46.0)
Hemoglobin: 12.4 g/dL (ref 12.0–15.0)
Immature Granulocytes: 0 %
Lymphocytes Relative: 19 %
Lymphs Abs: 1 10*3/uL (ref 0.7–4.0)
MCH: 31.3 pg (ref 26.0–34.0)
MCHC: 33.1 g/dL (ref 30.0–36.0)
MCV: 94.7 fL (ref 80.0–100.0)
Monocytes Absolute: 0.8 10*3/uL (ref 0.1–1.0)
Monocytes Relative: 16 %
Neutro Abs: 3.5 10*3/uL (ref 1.7–7.7)
Neutrophils Relative %: 64 %
Platelets: 247 10*3/uL (ref 150–400)
RBC: 3.96 MIL/uL (ref 3.87–5.11)
RDW: 13.4 % (ref 11.5–15.5)
WBC: 5.3 10*3/uL (ref 4.0–10.5)
nRBC: 0 % (ref 0.0–0.2)

## 2022-02-21 LAB — URINALYSIS, ROUTINE W REFLEX MICROSCOPIC
Bilirubin Urine: NEGATIVE
Glucose, UA: NEGATIVE mg/dL
Hgb urine dipstick: NEGATIVE
Ketones, ur: 20 mg/dL — AB
Leukocytes,Ua: NEGATIVE
Nitrite: NEGATIVE
Protein, ur: NEGATIVE mg/dL
Specific Gravity, Urine: 1.015 (ref 1.005–1.030)
pH: 5 (ref 5.0–8.0)

## 2022-02-21 LAB — CBG MONITORING, ED: Glucose-Capillary: 192 mg/dL — ABNORMAL HIGH (ref 70–99)

## 2022-02-21 LAB — LIPASE, BLOOD: Lipase: 21 U/L (ref 11–51)

## 2022-02-21 MED ORDER — INSULIN ASPART 100 UNIT/ML IJ SOLN
0.0000 [IU] | INTRAMUSCULAR | Status: DC
  Administered 2022-02-22: 1 [IU] via SUBCUTANEOUS
  Administered 2022-02-22: 2 [IU] via SUBCUTANEOUS
  Administered 2022-02-22 – 2022-02-23 (×4): 1 [IU] via SUBCUTANEOUS
  Administered 2022-02-23 – 2022-02-24 (×2): 2 [IU] via SUBCUTANEOUS
  Administered 2022-02-24: 1 [IU] via SUBCUTANEOUS
  Administered 2022-02-24: 2 [IU] via SUBCUTANEOUS
  Administered 2022-02-24: 1 [IU] via SUBCUTANEOUS
  Administered 2022-02-24 (×2): 2 [IU] via SUBCUTANEOUS
  Administered 2022-02-25: 5 [IU] via SUBCUTANEOUS
  Administered 2022-02-25: 3 [IU] via SUBCUTANEOUS
  Administered 2022-02-25 (×4): 1 [IU] via SUBCUTANEOUS
  Administered 2022-02-26: 5 [IU] via SUBCUTANEOUS
  Administered 2022-02-26 (×3): 1 [IU] via SUBCUTANEOUS
  Administered 2022-02-26 (×2): 3 [IU] via SUBCUTANEOUS
  Administered 2022-02-27: 5 [IU] via SUBCUTANEOUS
  Administered 2022-02-27 (×3): 3 [IU] via SUBCUTANEOUS
  Administered 2022-02-27: 2 [IU] via SUBCUTANEOUS

## 2022-02-21 MED ORDER — ALBUTEROL SULFATE HFA 108 (90 BASE) MCG/ACT IN AERS
1.0000 | INHALATION_SPRAY | Freq: Four times a day (QID) | RESPIRATORY_TRACT | Status: DC | PRN
  Administered 2022-02-23: 2 via RESPIRATORY_TRACT
  Filled 2022-02-21: qty 6.7

## 2022-02-21 MED ORDER — NIFEDIPINE ER OSMOTIC RELEASE 30 MG PO TB24
90.0000 mg | ORAL_TABLET | Freq: Every morning | ORAL | Status: DC
  Administered 2022-02-24 – 2022-03-05 (×10): 90 mg via ORAL
  Filled 2022-02-21 (×12): qty 3

## 2022-02-21 MED ORDER — ENOXAPARIN SODIUM 30 MG/0.3ML IJ SOSY
30.0000 mg | PREFILLED_SYRINGE | INTRAMUSCULAR | Status: DC
  Administered 2022-02-22 – 2022-02-24 (×3): 30 mg via SUBCUTANEOUS
  Filled 2022-02-21 (×3): qty 0.3

## 2022-02-21 MED ORDER — ONDANSETRON HCL 4 MG/2ML IJ SOLN
4.0000 mg | Freq: Once | INTRAMUSCULAR | Status: AC
  Administered 2022-02-21: 4 mg via INTRAVENOUS
  Filled 2022-02-21: qty 2

## 2022-02-21 MED ORDER — PANTOPRAZOLE SODIUM 40 MG PO TBEC
40.0000 mg | DELAYED_RELEASE_TABLET | Freq: Every morning | ORAL | Status: DC
  Filled 2022-02-21: qty 1

## 2022-02-21 MED ORDER — ONDANSETRON HCL 4 MG/2ML IJ SOLN
4.0000 mg | Freq: Four times a day (QID) | INTRAMUSCULAR | Status: DC | PRN
  Administered 2022-02-23: 4 mg via INTRAVENOUS
  Filled 2022-02-21: qty 2

## 2022-02-21 MED ORDER — OLOPATADINE HCL 0.1 % OP SOLN
1.0000 [drp] | Freq: Two times a day (BID) | OPHTHALMIC | Status: DC
  Administered 2022-02-22 – 2022-03-05 (×22): 1 [drp] via OPHTHALMIC
  Filled 2022-02-21: qty 5

## 2022-02-21 MED ORDER — SODIUM CHLORIDE 0.9 % IV SOLN: INTRAVENOUS | Status: DC

## 2022-02-21 MED ORDER — IOHEXOL 300 MG/ML  SOLN
100.0000 mL | Freq: Once | INTRAMUSCULAR | Status: AC | PRN
  Administered 2022-02-21: 100 mL via INTRAVENOUS

## 2022-02-21 MED ORDER — MORPHINE SULFATE (PF) 2 MG/ML IV SOLN: 2.0000 mg | INTRAVENOUS | Status: DC | PRN

## 2022-02-21 MED ORDER — SODIUM CHLORIDE 0.9 % IV BOLUS
500.0000 mL | Freq: Once | INTRAVENOUS | Status: AC
  Administered 2022-02-21: 500 mL via INTRAVENOUS

## 2022-02-21 MED ORDER — PRAVASTATIN SODIUM 10 MG PO TABS
20.0000 mg | ORAL_TABLET | Freq: Every morning | ORAL | Status: DC
  Administered 2022-02-24 – 2022-03-05 (×10): 20 mg via ORAL
  Filled 2022-02-21 (×12): qty 2

## 2022-02-21 MED ORDER — MOMETASONE FURO-FORMOTEROL FUM 200-5 MCG/ACT IN AERO
2.0000 | INHALATION_SPRAY | Freq: Two times a day (BID) | RESPIRATORY_TRACT | Status: DC
  Administered 2022-02-22 – 2022-03-05 (×21): 2 via RESPIRATORY_TRACT
  Filled 2022-02-21: qty 8.8

## 2022-02-21 NOTE — ED Notes (Signed)
NG tube is good for use per Dr. Sabra Heck.

## 2022-02-21 NOTE — ED Notes (Signed)
Patient given 2 warm blankets.  

## 2022-02-21 NOTE — ED Notes (Signed)
Patient has not complained of nausea or vomiting since zofran

## 2022-02-21 NOTE — ED Triage Notes (Signed)
Patient coming from home with pallative care, daughter states she has been vomiting for the past 24 hours with weakness. Pallative care told patient and daughter to come here for further eval. Patient alert & oriented X3. CBG 248.

## 2022-02-21 NOTE — ED Provider Notes (Signed)
Medical screening examination/treatment/procedure(s) were conducted as a shared visit with non-physician practitioner(s) and myself.  I personally evaluated the patient during the encounter.  Clinical Impression:   Final diagnoses:  None      This patient is a very pleasant 88 year old female prior abdominal surgeries include colon cancer resection 30 years ago, hysterectomy, cholecystectomy.  She lives at home with her daughter, she is very functional, she is on palliative care secondary to 2 episodes of life-threatening illness that she had last year both of which she improved from.  She has had 2 days of feeling very nauseated dry heaves and vomiting.  There has been no fevers or chills, no bowel movements, no diarrhea, no rectal bleeding, no urinary symptoms, no fevers or chills.  On my exam, there is a slight arrhythmia, good pulses, normal color to the skin, clear lung sounds, no respiratory distress whatsoever.  She speaks in full sentences follows commands without difficulty and has no peripheral edema.  Her abdominal exam has mild diffuse tenderness, she is tympanitic to percussion diffusely, she has no guarding, no palpable masses, no pulsating masses.  Vital signs unremarkable, labs will be ordered, likely needs a CT scan to rule out causes of abdominal pain and vomiting such as bowel obstruction.  Patient agreeable, family member at the bedside updated and agreeable as well.   Noemi Chapel, MD 02/23/22 1254

## 2022-02-21 NOTE — ED Notes (Signed)
Patient transported to CT 

## 2022-02-21 NOTE — ED Provider Notes (Signed)
[facility] EMERGENCY DEPARTMENT Provider Note   CSN: [id number] Arrival date & time: 02/21/22  1459     History  Chief Complaint  Patient presents with   Emesis    Robin Mcclure is a 88 y.o. female with a history of type 2 diabetes mellitus, hysterectomy, colon cancer s/p colectomy (in remission), cholecystectomy, under palliative care coming from home complaining of vomiting since Monday.  Patient states palliative care told her to proceed to the ED for further evaluation of vomiting.  She states she has multiple episodes of vomiting throughout the day of stomach contents.  Today, patient has been unable to keep any liquids down including water.  Patient states approximately 1 to 2 hours after consuming liquids/food vomiting episodes occur.  She complains of generalized abdominal tenderness from multiple episodes of vomiting.  She states her last bowel movement was yesterday, and is no longer passing stool or gas.  Also reports generalized weakness and an episode of night sweats.  Denies fever, chills, diarrhea, melena, hematochezia, hematemesis, chest pain, shortness of breath, lightheadedness, syncope.  Patient has been unable to take any of her prescribed at home medications today.      Home Medications Prior to Admission medications   Medication Sig Start Date End Date Taking? Authorizing Provider  acetaminophen (TYLENOL) 325 MG tablet Take 650 mg by mouth every 6 (six) hours as needed for mild pain.    [provider]  albuterol (PROVENTIL HFA;VENTOLIN HFA) 108 (90 Base) MCG/ACT inhaler Inhale 1-2 puffs into the lungs every 6 (six) hours as needed for wheezing or shortness of breath.    [provider]  amoxicillin (AMOXIL) 500 MG capsule SMARTSIG:4 Capsule(s) By Mouth Once 11/14/21   [provider]  B Complex Vitamins (B COMPLEX PO) Take 1 tablet by mouth daily.    [provider]  Cholecalciferol 25 MCG (1000 UT) tablet Take 1,000 Units by mouth  daily.     [provider]  dextromethorphan (DELSYM) 30 MG/5ML liquid Take 2.5 mLs (15 mg total) by mouth every 6 (six) hours as needed for cough. 08/22/20   Barton Dubois, MD  Fluticasone-Salmeterol (ADVAIR) 500-50 MCG/DOSE AEPB Inhale 1 puff into the lungs 2 (two) times daily.    [provider]  furosemide (LASIX) 40 MG tablet Take 40 mg by mouth daily. 06/19/21   [provider]  glipiZIDE (GLUCOTROL XL) 2.5 MG 24 hr tablet Take 2.5 mg by mouth daily with breakfast.    [provider]  ibuprofen (ADVIL) 600 MG tablet Take 1 tablet (600 mg total) by mouth every 12 (twelve) hours. 08/22/20   Barton Dubois, MD  metFORMIN (GLUCOPHAGE) 500 MG tablet Take 500 mg by mouth 2 (two) times daily. 07/06/21   [provider]  Multiple Minerals (CALCIUM/MAGNESIUM/ZINC PO) Take 1 tablet by mouth 2 (two) times daily.    [provider]  Multiple Vitamins-Minerals ([employer] SPECTRAVITE SENIOR PO) Take 1 tablet by mouth daily.     [provider]  NIFEdipine (ADALAT CC) 90 MG 24 hr tablet Take 90 mg by mouth every morning.    [provider]  ondansetron (ZOFRAN) 4 MG tablet Take 4 mg by mouth every 8 (eight) hours as needed for nausea. 01/20/18   [provider]  OXYGEN Inhale 4 L into the lungs daily.    [provider]  pantoprazole (PROTONIX) 40 MG tablet Take 40 mg by mouth every morning. 03/10/18   [provider]  polyethylene glycol powder (MIRALAX) 17  GM/SCOOP powder See admin instructions.    [provider]  Potassium Chloride ER 20 MEQ TBCR Take 1 tablet by mouth 2 (two) times daily. 06/04/21   [provider]  pravastatin (PRAVACHOL) 20 MG tablet Take 20 mg by mouth every morning.    [provider]  sitaGLIPtin (JANUVIA) 50 MG tablet Take 1 tablet (50 mg total) by mouth daily. Patient taking differently: Take 50 mg by mouth every morning. 06/23/20   Patrecia Pour, MD  sucralfate  (CARAFATE) 1 GM/10ML suspension Take 10 mLs (1 g total) by mouth 2 (two) times daily. 08/22/20   Barton Dubois, MD      Allergies    Patient has no known allergies.    Review of Systems   Review of Systems  Constitutional:  Positive for appetite change and fatigue. Negative for chills and fever.  Gastrointestinal:  Positive for abdominal pain, nausea and vomiting.  Neurological:  Negative for syncope.    Physical Exam Updated Vital Signs BP (!) 158/84   Pulse 86   Temp 98.8 F (37.1 C) (Oral)   Resp 20   Ht '5\' 5"'$  (1.651 m)   Wt 63.5 kg   SpO2 100%   BMI 23.30 kg/m  Physical Exam Vitals and nursing note reviewed.  Constitutional:      General: She is not in acute distress.    Appearance: She is normal weight. She is ill-appearing.  HENT:     Mouth/Throat:     Mouth: Mucous membranes are dry.     Pharynx: Oropharynx is clear.  Eyes:     Conjunctiva/sclera: Conjunctivae normal.  Cardiovascular:     Rate and Rhythm: Normal rate. Rhythm irregular.     Pulses: Normal pulses.     Heart sounds: Murmur heard.  Pulmonary:     Effort: Pulmonary effort is normal. No respiratory distress.     Breath sounds: Normal breath sounds and air entry.  Abdominal:     General: Bowel sounds are increased. There is no distension.     Palpations: Abdomen is soft.     Tenderness: There is generalized abdominal tenderness. There is no guarding or rebound.     Comments: Hyperactive bowel sounds in RUQ and LUQ.  Tympany noted on percussion.  Musculoskeletal:        General: No swelling or tenderness.     Right lower leg: No edema.     Left lower leg: No edema.  Skin:    General: Skin is warm and dry.     Capillary Refill: Capillary refill takes less than 2 seconds.     Coloration: Skin is pale. Skin is not jaundiced.  Neurological:     Mental Status: She is alert and oriented to person, place, and time. Mental status is at baseline.  Psychiatric:        Mood and Affect: Mood normal.         Behavior: Behavior normal. Behavior is cooperative.        Thought Content: Thought content normal.     ED Results / Procedures / Treatments   Labs (all labs ordered are listed, but only abnormal results are displayed) Labs Reviewed  CBC WITH DIFFERENTIAL/PLATELET  COMPREHENSIVE METABOLIC PANEL  LIPASE, BLOOD  URINALYSIS, ROUTINE W REFLEX MICROSCOPIC    EKG None  Radiology No results found.  Procedures Procedures    Medications Ordered in ED Medications  ondansetron (ZOFRAN) injection 4 mg (has no administration in time range)    ED Course/  Medical Decision Making/ A&P                           Medical Decision Making Amount and/or Complexity of Data Reviewed Labs: ordered. Radiology: ordered. ECG/medicine tests: ordered.  Risk Prescription drug management. Decision regarding hospitalization.   This patient presents to the ED for concern of persistent episodes of vomiting over 48 hours and inability to keep solids and liquids down, this involves an extensive number of treatment options, and is a complaint that carries with it a high risk of complications and morbidity.  The differential diagnosis includes small bowel obstruction, gastritis, gastroparesis, acute pancreatitis.    Co morbidities that complicate the patient evaluation  Diabetes mellitus type 2, dementia    Additional history obtained:  External records from outside source obtained and reviewed including notes and test results from care during hospital encounter 08/2020.   Lab Tests:  I ordered and personally interpreted labs.  The pertinent results include:  glucose 221, UA with ketones.   Imaging Studies ordered:  I ordered imaging studies including CT abdomen and pelvis with contrast. I independently visualized and interpreted imaging which showed partial small bowel obstruction mid abdomen. I agree with the radiologist interpretation   Consultations Obtained:  Requested  consultation with the hospitalist team,  and discussed lab and imaging findings as well as pertinent plan - they recommend: admitting patient    Problem List / ED Course / Critical interventions / Medication management  Small bowel obstruction I ordered medication including NS bolus, and Zofran for nausea/vomiting. I ordered NG tube for gastric decompression. Reevaluation of the patient after these medicines showed that the patient stayed the same    Social Determinants of Health:  Elderly female under palliative care at home   Test / Admission - Considered:  Patient to be admitted for further treatment/management of small bowel obstruction.  Discussed with Dr. Noemi Chapel who agreed with assessment and plan. Patient presents to ED with signs and symptoms consistent with small bowel obstruction which was confirmed on CT. NG tube was placed while patient in ED to aid with symptoms and gastric decompression. Hospital team was consulted, and recommended admitting patient for further treatment/management of patient's condition. Discussed findings and recommendations with patient and her daughter at bedside who agreed with plan. Patient will be admitted.         Final Clinical Impression(s) / ED Diagnoses Final diagnoses:  None    Rx / DC Orders ED Discharge Orders     None         Pat Kocher, [place] 02/23/22 1257    Noemi Chapel, MD 02/23/22 2231

## 2022-02-21 NOTE — H&P (Signed)
History and Physical    Patient: Robin Mcclure NOB:[id number] DOB: [birth year] DOA: 02/21/2022 DOS: the patient was seen and examined on 02/21/2022 PCP: Lujean Amel, MD  Patient coming from: Home  Chief Complaint:  Chief Complaint  Patient presents with   Emesis   HPI: Robin Mcclure is a 88 y.o. female with medical history significant of COPD, GERD, hypertension, hyperlipidemia, T2DM who presents to the emergency department accompanied by daughter due to 2-day onset of nausea, vomiting and difficulty in being able to tolerate oral intake.  Patient was on palliative care due to 2 episodes of life-threatening illnesses last year, daughter consulted with the palliative care nurse regarding patient's symptoms and it was advised for the patient to go to the ED for further evaluation and management.  Last bowel movement was yesterday (10/3 in the afternoon, she denies abdominal pain, chest pain, shortness of breath, fever, chills.  ED Course:  In the emergency department, she was intermittently tachypneic and tachycardic.  BP was 158/84, other vital signs were within normal range.  Work-up in the ED showed normal CBC, BMP sodium 138, potassium 3.9, chloride 107, bicarb 19, blood glucose 221, BUN 28, creatinine 0.97, albumin 3.2, lipase 21, urinalysis was normal. CT abdomen and pelvis with contrast showed findings consistent with a partial small bowel obstruction versus ileus.  NG tube was placed.  Zofran was given, IV hydration was provided.  Hospitalist was asked to admit patient for further evaluation and management.  Review of Systems: Review of systems as noted in the HPI. All other systems reviewed and are negative.   Past Medical History:  Diagnosis Date   Diabetes mellitus without complication ([place])    Hyperlipidemia    Hypertension    Past Surgical History:  Procedure Laterality Date   ABDOMINAL HYSTERECTOMY     BIOPSY  01/19/2018   Procedure: BIOPSY;  Surgeon: Ronnette Juniper, MD;   Location: [facility] ENDOSCOPY;  Service: Gastroenterology;;   CHOLECYSTECTOMY     ESOPHAGOGASTRODUODENOSCOPY (EGD) WITH PROPOFOL N/A 01/19/2018   Procedure: ESOPHAGOGASTRODUODENOSCOPY (EGD) WITH PROPOFOL;  Surgeon: Ronnette Juniper, MD;  Location: [place];  Service: Gastroenterology;  Laterality: N/A;   HIP SURGERY      Social History:  reports that she has never smoked. She has never used smokeless tobacco. She reports that she does not drink alcohol and does not use drugs.   No Known Allergies  History reviewed. No pertinent family history.    Prior to Admission medications   Medication Sig Start Date End Date Taking? Authorizing Provider  acetaminophen (TYLENOL) 650 MG CR tablet Take 1,300 mg by mouth every 8 (eight) hours as needed for pain.   Yes [provider]  albuterol (PROVENTIL HFA;VENTOLIN HFA) 108 (90 Base) MCG/ACT inhaler Inhale 1-2 puffs into the lungs every 6 (six) hours as needed for wheezing or shortness of breath.   Yes [provider]  Fluticasone-Salmeterol (ADVAIR) 500-50 MCG/DOSE AEPB Inhale 1 puff into the lungs 2 (two) times daily.   Yes [provider]  furosemide (LASIX) 40 MG tablet Take 40 mg by mouth daily. 06/19/21  Yes [provider]  JANUVIA 100 MG tablet Take 100 mg by mouth daily. 02/09/22  Yes [provider]  metFORMIN (GLUCOPHAGE) 500 MG tablet Take 500 mg by mouth daily. 07/06/21  Yes [provider]  NIFEdipine (ADALAT CC) 90 MG 24 hr tablet Take 90 mg by mouth every morning.   Yes [provider]  olopatadine (PATADAY) 0.1 % ophthalmic solution Place 1 drop  into both eyes 2 (two) times daily.   Yes [provider]  ondansetron (ZOFRAN) 4 MG tablet Take 4 mg by mouth every 8 (eight) hours as needed for nausea. 01/20/18  Yes [provider]  pantoprazole (PROTONIX) 40 MG tablet Take 40 mg by mouth every morning. 03/10/18  Yes [provider]  polyethylene glycol powder  (MIRALAX) 17 GM/SCOOP powder See admin instructions.   Yes [provider]  Potassium Chloride ER 20 MEQ TBCR Take 1 tablet by mouth 2 (two) times daily. 06/04/21  Yes [provider]  pravastatin (PRAVACHOL) 20 MG tablet Take 20 mg by mouth every morning.   Yes [provider]  amoxicillin (AMOXIL) 500 MG capsule 500 mg. Take 4 capsules by mouth once for dental visits 11/14/21   [provider]    Physical Exam: BP 128/88 (BP Location: Left Arm)   Pulse (!) 102   Temp 98.5 F (36.9 C)   Resp 16   Ht '5\' 5"'$  (1.651 m)   Wt 63.5 kg   SpO2 96%   BMI 23.30 kg/m   General: 88 y.o. year-old female ill appearing, but in no acute distress.  Alert and oriented x3. HEENT: NCAT, EOMI, dry mucous membranes Neck: Supple, trachea medial Cardiovascular: Regular rate and rhythm with no rubs or gallops.  No thyromegaly or JVD noted. 2/4 pulses in all 4 extremities. Respiratory: Clear to auscultation with no wheezes or rales. Good inspiratory effort. Abdomen: NG tube in place.  Soft, nontender nondistended with normal bowel sounds x4 quadrants. Muskuloskeletal: RLE swelling > LLE. No cyanosis or clubbing noted bilaterally Neuro: CN II-XII intact, strength 5/5 x 4, sensation, reflexes intact Skin: No ulcerative lesions noted or rashes Psychiatry: Judgement and insight appear normal. Mood is appropriate for condition and setting          Labs on Admission:  Basic Metabolic Panel: Recent Labs  Lab 02/21/22 1534  NA 138  K 3.9  CL 107  CO2 19*  GLUCOSE 221*  BUN 28*  CREATININE 0.97  CALCIUM 9.6   Liver Function Tests: Recent Labs  Lab 02/21/22 1534  AST 12*  ALT 12  ALKPHOS 66  BILITOT 1.3*  PROT 6.6  ALBUMIN 3.2*   Recent Labs  Lab 02/21/22 1534  LIPASE 21   No results for input(s): "AMMONIA" in the last 168 hours. CBC: Recent Labs  Lab 02/21/22 1534  WBC 5.3  NEUTROABS 3.5  HGB 12.4  HCT 37.5  MCV 94.7  PLT 247   Cardiac  Enzymes: No results for input(s): "CKTOTAL", "CKMB", "CKMBINDEX", "TROPONINI" in the last 168 hours.  BNP (last 3 results) No results for input(s): "BNP" in the last 8760 hours.  ProBNP (last 3 results) No results for input(s): "PROBNP" in the last 8760 hours.  CBG: Recent Labs  Lab 02/21/22 1516  GLUCAP 192*    Radiological Exams on Admission: DG Chest Portable 1 View  Result Date: 02/21/2022 CLINICAL DATA:  Enteric catheter placement EXAM: PORTABLE CHEST 1 VIEW COMPARISON:  08/18/2020 FINDINGS: Single frontal view of the chest demonstrates enteric catheter passing below diaphragm, tip excluded by collimation. Side port projects over the gastric fundus. Cardiac silhouette is enlarged but stable. No airspace disease, effusion, or pneumothorax. No acute bony abnormality. IMPRESSION: 1. Enteric catheter coiled over the stomach, tip excluded by collimation. The side port projects over the gastric fundus. Electronically Signed   By: Randa Ngo M.D.   On: 02/21/2022 20:01   DG Abd Portable 1 View  Result Date: 02/21/2022 CLINICAL DATA:  Enteric catheter placement EXAM: PORTABLE ABDOMEN - 1 VIEW COMPARISON:  02/21/2022 FINDINGS: Frontal view of the lower chest and upper abdomen demonstrates enteric catheter passing below diaphragm, looped back upon itself with tip and side port extending retrograde in the region of the distal thoracic esophagus. Recommend removal and replacement. Lung bases are clear. Gaseous distention of the small bowel consistent with obstruction as seen on recent CT. IMPRESSION: 1. Enteric catheter as above, coiled back upon itself with tip projecting over the distal thoracic esophagus. Recommend removal and replacement. 2. Small bowel obstruction. Electronically Signed   By: Randa Ngo M.D.   On: 02/21/2022 18:34   CT Abdomen Pelvis W Contrast  Result Date: 02/21/2022 CLINICAL DATA:  Difficulty urinating. EXAM: CT ABDOMEN AND PELVIS WITH CONTRAST TECHNIQUE:  Multidetector CT imaging of the abdomen and pelvis was performed using the standard protocol following bolus administration of intravenous contrast. RADIATION DOSE REDUCTION: This exam was performed according to the departmental dose-optimization program which includes automated exposure control, adjustment of the mA and/or kV according to patient size and/or use of iterative reconstruction technique. CONTRAST:  149m OMNIPAQUE IOHEXOL 300 MG/ML  SOLN COMPARISON:  None Available. FINDINGS: Lower chest: No acute abnormality. Hepatobiliary: No focal liver abnormality is seen. Status post cholecystectomy. No biliary dilatation. Pancreas: Unremarkable. No pancreatic ductal dilatation or surrounding inflammatory changes. Spleen: Normal in size without focal abnormality. Adrenals/Urinary Tract: Adrenal glands are unremarkable. Kidneys are normal in size with mild areas of renal cortical thinning noted. There is no evidence of renal calculi or hydronephrosis. Bladder is unremarkable. Stomach/Bowel: Stomach is within normal limits. The appendix is not identified. Multiple dilated small bowel loops are seen throughout the abdomen and pelvis (maximum small bowel diameter of approximately 3.8 cm). A gradual transition zone is seen within the lateral aspect of the mid to lower right abdomen. Numerous noninflamed diverticula are seen throughout the sigmoid colon. Vascular/Lymphatic: Aortic atherosclerosis. No enlarged abdominal or pelvic lymph nodes. Reproductive: Status post hysterectomy. No adnexal masses. Other: No abdominal wall hernia or abnormality. No abdominopelvic ascites. Musculoskeletal: A total right hip replacement is seen with a marked amount of associated streak artifact. Subsequently limited evaluation of the adjacent osseous and soft tissue structures is noted. A chronic compression fracture deformity is seen at the level of T11. Multilevel degenerative changes are seen throughout the remainder of the lumbar  spine. IMPRESSION: 1. Findings consistent with a partial small bowel obstruction versus ileus. 2. Sigmoid diverticulosis. 3. Total right hip replacement. 4. Chronic compression fracture deformity at the level of T11. 5. Aortic atherosclerosis. Aortic Atherosclerosis (ICD10-I70.0). Electronically Signed   By: TVirgina NorfolkM.D.   On: 02/21/2022 17:12    EKG: I independently viewed the EKG done and my findings are as followed: Normal sinus rhythm at a rate of 61 bpm with possibly AV block with PVCs  Assessment/Plan Present on Admission:  Partial small bowel obstruction (HCC)  Gastroesophageal reflux disease  Essential hypertension  Chronic obstructive pulmonary disease ([place]  Principal Problem:   Partial small bowel obstruction ([place] Active Problems:   Essential hypertension   Gastroesophageal reflux disease   Chronic obstructive pulmonary disease (HCC)   Dehydration   Hypoalbuminemia due to protein-calorie malnutrition ([place]   Uncontrolled type 2 diabetes mellitus with hyperglycemia (HCC)   Mixed hyperlipidemia   Right leg swelling  Partial small bowel obstruction Continue NG tube  Continue NPO at this time with plan to advance diet as tolerated Continue IV hydration Continue  IV morphine 2 mg q.4h p.r.n. for moderate/severe pain Continue Zofran p.r.n. for nausea/vomiting Consider surgical consult if abdominal pain continues to worsen  Dehydration Continue IV hydration  Hypoalbuminemia secondary to mild protein calorie malnutrition Consider protein supplement when patient resumes oral intake  T2DM with poorly controlled hyperglycemia Continue ISS and hypoglycemia protocol  Right leg swelling > LLE Right lower extremity ultrasound in the morning to rule out DVT  COPD Continue Ventolin and Dulera  GERD Continue Protonix  Essential hypertension Continue nifedipine  Mixed hyperlipidemia Continue pravastatin  Other home meds: Olopatadine   DVT prophylaxis:  Lovenox  Code Status: Full code  Consults: None  Family Communication: Daughter at bedside (all questions answered to satisfaction)  Severity of Illness: The appropriate patient status for this patient is INPATIENT. Inpatient status is judged to be reasonable and necessary in order to provide the required intensity of service to ensure the patient's safety. The patient's presenting symptoms, physical exam findings, and initial radiographic and laboratory data in the context of their chronic comorbidities is felt to place them at high risk for further clinical deterioration. Furthermore, it is not anticipated that the patient will be medically stable for discharge from the hospital within 2 midnights of admission.   * I certify that at the point of admission it is my clinical judgment that the patient will require inpatient hospital care spanning beyond 2 midnights from the point of admission due to high intensity of service, high risk for further deterioration and high frequency of surveillance required.*  Author: Bernadette Hoit, DO 02/21/2022 11:44 PM  For on call review [email or web address].

## 2022-02-22 ENCOUNTER — Inpatient Hospital Stay (HOSPITAL_COMMUNITY): Payer: MEDICARE

## 2022-02-22 DIAGNOSIS — K56600 Partial intestinal obstruction, unspecified as to cause: Secondary | ICD-10-CM | POA: Diagnosis not present

## 2022-02-22 LAB — CBC
HCT: 35.9 % — ABNORMAL LOW (ref 36.0–46.0)
Hemoglobin: 11.9 g/dL — ABNORMAL LOW (ref 12.0–15.0)
MCH: 31.7 pg (ref 26.0–34.0)
MCHC: 33.1 g/dL (ref 30.0–36.0)
MCV: 95.7 fL (ref 80.0–100.0)
Platelets: 234 10*3/uL (ref 150–400)
RBC: 3.75 MIL/uL — ABNORMAL LOW (ref 3.87–5.11)
RDW: 13.7 % (ref 11.5–15.5)
WBC: 3.9 10*3/uL — ABNORMAL LOW (ref 4.0–10.5)
nRBC: 0 % (ref 0.0–0.2)

## 2022-02-22 LAB — GLUCOSE, CAPILLARY
Glucose-Capillary: 171 mg/dL — ABNORMAL HIGH (ref 70–99)
Glucose-Capillary: 122 mg/dL — ABNORMAL HIGH (ref 70–99)
Glucose-Capillary: 118 mg/dL — ABNORMAL HIGH (ref 70–99)
Glucose-Capillary: 119 mg/dL — ABNORMAL HIGH (ref 70–99)
Glucose-Capillary: 144 mg/dL — ABNORMAL HIGH (ref 70–99)

## 2022-02-22 LAB — COMPREHENSIVE METABOLIC PANEL
ALT: 12 U/L (ref 0–44)
AST: 11 U/L — ABNORMAL LOW (ref 15–41)
Albumin: 3 g/dL — ABNORMAL LOW (ref 3.5–5.0)
Alkaline Phosphatase: 58 U/L (ref 38–126)
Anion gap: 8 (ref 5–15)
BUN: 29 mg/dL — ABNORMAL HIGH (ref 8–23)
CO2: 20 mmol/L — ABNORMAL LOW (ref 22–32)
Calcium: 9 mg/dL (ref 8.9–10.3)
Chloride: 111 mmol/L (ref 98–111)
Creatinine, Ser: 0.97 mg/dL (ref 0.44–1.00)
GFR, Estimated: 53 mL/min — ABNORMAL LOW (ref >60)
Glucose, Bld: 186 mg/dL — ABNORMAL HIGH (ref 70–99)
Potassium: 3.7 mmol/L (ref 3.5–5.1)
Sodium: 139 mmol/L (ref 135–145)
Total Bilirubin: 1.1 mg/dL (ref 0.3–1.2)
Total Protein: 6.1 g/dL — ABNORMAL LOW (ref 6.5–8.1)

## 2022-02-22 LAB — PHOSPHORUS: Phosphorus: 2.9 mg/dL (ref 2.5–4.6)

## 2022-02-22 LAB — HEMOGLOBIN A1C
Hgb A1c MFr Bld: 6.4 % — ABNORMAL HIGH (ref 4.8–5.6)
Mean Plasma Glucose: 136.98 mg/dL

## 2022-02-22 LAB — APTT: aPTT: 31 seconds (ref 24–36)

## 2022-02-22 LAB — MAGNESIUM: Magnesium: 1.5 mg/dL — ABNORMAL LOW (ref 1.7–2.4)

## 2022-02-22 MED ORDER — FAMOTIDINE IN NACL 20-0.9 MG/50ML-% IV SOLN
20.0000 mg | INTRAVENOUS | Status: DC
  Administered 2022-02-22 – 2022-03-01 (×8): 20 mg via INTRAVENOUS
  Filled 2022-02-22 (×8): qty 50

## 2022-02-22 MED ORDER — SODIUM CHLORIDE 0.9 % IV SOLN: INTRAVENOUS | Status: DC

## 2022-02-22 MED ORDER — LABETALOL HCL 5 MG/ML IV SOLN: 10.0000 mg | INTRAVENOUS | Status: DC | PRN

## 2022-02-22 MED ORDER — BISACODYL 10 MG RE SUPP
10.0000 mg | Freq: Once | RECTAL | Status: AC
  Administered 2022-02-22: 10 mg via RECTAL
  Filled 2022-02-22: qty 1

## 2022-02-22 MED ORDER — HYDRALAZINE HCL 20 MG/ML IJ SOLN: 10.0000 mg | Freq: Four times a day (QID) | INTRAMUSCULAR | Status: DC | PRN

## 2022-02-22 MED ORDER — FAMOTIDINE IN NACL 20-0.9 MG/50ML-% IV SOLN: 20.0000 mg | Freq: Two times a day (BID) | INTRAVENOUS | Status: DC

## 2022-02-22 MED ORDER — FENTANYL CITRATE PF 50 MCG/ML IJ SOSY: 25.0000 ug | PREFILLED_SYRINGE | INTRAMUSCULAR | Status: DC | PRN

## 2022-02-22 NOTE — Progress Notes (Signed)
PROGRESS NOTE     Robin Mcclure, is a 88 y.o. female, DOB - Oct 14, 1922, SWN:[id number]  Admit date - 02/21/2022   Admitting Physician Bernadette Hoit, DO  Outpatient Primary MD for the patient is [name], [name], MD  LOS - 1  Chief Complaint  Patient presents with   Emesis        Brief Narrative:   88 y.o. female with medical history significant of COPD, GERD, hypertension, hyperlipidemia, T2DM admitted on 02/21/2022 with intractable emesis and found to have partial small bowel obstruction versus ileus    -Assessment and Plan:  1)PSBO Vs Ileus---c/n NG tube -Dulcolax suppository -Enemas as ordered -IV antiemetics as needed -IV fentanyl as needed -Repeat abdominal x-rays in a.m.  2)Hypomagnesemia--- replace and recheck, potassium and phosphorus WNL  3)DM2-A1c 6.4 reflecting excellent diabetic control PTA -Hold Januvia and metformin Use Novolog/Humalog Sliding scale insulin with Accu-Cheks/Fingersticks as ordered   4)HTN- Hold nifedipine  may use IV Hydralazine 10 mg  Every 4 hours Prn for systolic blood pressure over 160 mmhg  Prophylaxis:- -Lovenox for DVT prophylaxis and Pepcid for GI prophylaxis  Disposition/Need for in-Hospital Stay- patient unable to be discharged at this time due to - -continue IV fluids pending return of bowel function and tolerance of oral intake  Status is: Inpatient   Disposition: The patient is from: Home              Anticipated d/c is to: Home              Anticipated d/c date is: 3 days              Patient currently is not medically stable to d/c. Barriers: Not Clinically Stable-   Code Status :  -  Code Status: Full Code   Family Communication:    NA (patient is alert, awake and coherent)   DVT Prophylaxis  :   - SCDs   enoxaparin (LOVENOX) injection 30 mg Start: 02/22/22 1000 SCDs Start: 02/21/22 2316   Lab Results  Component Value Date   PLT 234 02/22/2022    Inpatient Medications  Scheduled Meds:  enoxaparin  (LOVENOX) injection  30 mg Subcutaneous Q24H   insulin aspart  0-9 Units Subcutaneous Q4H   mometasone-formoterol  2 puff Inhalation BID   NIFEdipine  90 mg Oral q morning   olopatadine  1 drop Both Eyes BID   pravastatin  20 mg Oral q morning   Continuous Infusions:  sodium chloride 50 mL/hr at 02/22/22 0821   famotidine (PEPCID) IV     PRN Meds:.albuterol, fentaNYL (SUBLIMAZE) injection, hydrALAZINE, ondansetron (ZOFRAN) IV   Anti-infectives (From admission, onward)    None         Subjective: [name] today has no fevers, no further emesis,  No chest pain,   - Abdominal pain persist -  Objective: Vitals:   02/22/22 0449 02/22/22 0848 02/22/22 0849 02/22/22 1303  BP:    (!) 149/90  Pulse: 94 69  78  Resp:  18    Temp:    97.8 F (36.6 C)  TempSrc:      SpO2:  93% 93% 93%  Weight:      Height:        Intake/Output Summary (Last 24 hours) at 02/22/2022 1645 Last data filed at 02/22/2022 1600 Gross per 24 hour  Intake 688.8 ml  Output --  Net 688.8 ml   Filed Weights   02/21/22 1505 02/21/22 2145  Weight: 63.5 kg 55.6 kg  Physical Exam  Gen:- Awake Alert,  in no apparent distress  HEENT:- [place].AT, No sclera icterus Nose- NG tube with gastric content Neck-Supple Neck,No JVD,.  Lungs-  CTAB , fair symmetrical air movement CV- S1, S2 normal, regular  Abd-  +ve B.Sounds, Abd Soft, No tenderness,    Extremity/Skin:- No  edema, pedal pulses present  Psych-affect is appropriate, oriented x3 Neuro-no new focal deficits, no tremors  Data Reviewed: I have personally reviewed following labs and imaging studies  CBC: Recent Labs  Lab 02/21/22 1534 02/22/22 0029  WBC 5.3 3.9*  NEUTROABS 3.5  --   HGB 12.4 11.9*  HCT 37.5 35.9*  MCV 94.7 95.7  PLT 247 761   Basic Metabolic Panel: Recent Labs  Lab 02/21/22 1534 02/22/22 0029  NA 138 139  K 3.9 3.7  CL 107 111  CO2 19* 20*  GLUCOSE 221* 186*  BUN 28* 29*  CREATININE 0.97 0.97  CALCIUM 9.6  9.0  MG  --  1.5*  PHOS  --  2.9   GFR: Estimated Creatinine Clearance: 29.1 mL/min (by C-G formula based on SCr of 0.97 mg/dL). Liver Function Tests: Recent Labs  Lab 02/21/22 1534 02/22/22 0029  AST 12* 11*  ALT 12 12  ALKPHOS 66 58  BILITOT 1.3* 1.1  PROT 6.6 6.1*  ALBUMIN 3.2* 3.0*   HbA1C: Recent Labs    02/22/22 0029  HGBA1C 6.4*   Radiology Studies: US Venous Img Lower Unilateral Right (DVT)  Result Date: 02/22/2022 CLINICAL DATA:  Right leg swelling EXAM: RIGHT LOWER EXTREMITY VENOUS DOPPLER ULTRASOUND TECHNIQUE: Gray-scale sonography with compression, as well as color and duplex ultrasound, were performed to evaluate the deep venous system(s) from the level of the common femoral vein through the popliteal and proximal calf veins. COMPARISON:  None available FINDINGS: VENOUS Normal compressibility of the common femoral, superficial femoral, and popliteal veins, as well as the visualized calf veins. Visualized portions of profunda femoral vein and great saphenous vein unremarkable. No filling defects to suggest DVT on grayscale or color Doppler imaging. Doppler waveforms show normal direction of venous flow, normal respiratory plasticity and response to augmentation. Limited views of the contralateral common femoral vein are unremarkable. OTHER None. Limitations: none IMPRESSION: No right lower extremity DVT. Electronically Signed   By: Miachel Roux M.D.   On: 02/22/2022 13:22   DG Chest Portable 1 View  Result Date: 02/21/2022 CLINICAL DATA:  Enteric catheter placement EXAM: PORTABLE CHEST 1 VIEW COMPARISON:  08/18/2020 FINDINGS: Single frontal view of the chest demonstrates enteric catheter passing below diaphragm, tip excluded by collimation. Side port projects over the gastric fundus. Cardiac silhouette is enlarged but stable. No airspace disease, effusion, or pneumothorax. No acute bony abnormality. IMPRESSION: 1. Enteric catheter coiled over the stomach, tip excluded by  collimation. The side port projects over the gastric fundus. Electronically Signed   By: Randa Ngo M.D.   On: 02/21/2022 20:01   DG Abd Portable 1 View  Result Date: 02/21/2022 CLINICAL DATA:  Enteric catheter placement EXAM: PORTABLE ABDOMEN - 1 VIEW COMPARISON:  02/21/2022 FINDINGS: Frontal view of the lower chest and upper abdomen demonstrates enteric catheter passing below diaphragm, looped back upon itself with tip and side port extending retrograde in the region of the distal thoracic esophagus. Recommend removal and replacement. Lung bases are clear. Gaseous distention of the small bowel consistent with obstruction as seen on recent CT. IMPRESSION: 1. Enteric catheter as above, coiled back upon itself with tip projecting over the distal  thoracic esophagus. Recommend removal and replacement. 2. Small bowel obstruction. Electronically Signed   By: Randa Ngo M.D.   On: 02/21/2022 18:34   CT Abdomen Pelvis W Contrast  Result Date: 02/21/2022 CLINICAL DATA:  Difficulty urinating. EXAM: CT ABDOMEN AND PELVIS WITH CONTRAST TECHNIQUE: Multidetector CT imaging of the abdomen and pelvis was performed using the standard protocol following bolus administration of intravenous contrast. RADIATION DOSE REDUCTION: This exam was performed according to the departmental dose-optimization program which includes automated exposure control, adjustment of the mA and/or kV according to patient size and/or use of iterative reconstruction technique. CONTRAST:  133m OMNIPAQUE IOHEXOL 300 MG/ML  SOLN COMPARISON:  None Available. FINDINGS: Lower chest: No acute abnormality. Hepatobiliary: No focal liver abnormality is seen. Status post cholecystectomy. No biliary dilatation. Pancreas: Unremarkable. No pancreatic ductal dilatation or surrounding inflammatory changes. Spleen: Normal in size without focal abnormality. Adrenals/Urinary Tract: Adrenal glands are unremarkable. Kidneys are normal in size with mild areas of  renal cortical thinning noted. There is no evidence of renal calculi or hydronephrosis. Bladder is unremarkable. Stomach/Bowel: Stomach is within normal limits. The appendix is not identified. Multiple dilated small bowel loops are seen throughout the abdomen and pelvis (maximum small bowel diameter of approximately 3.8 cm). A gradual transition zone is seen within the lateral aspect of the mid to lower right abdomen. Numerous noninflamed diverticula are seen throughout the sigmoid colon. Vascular/Lymphatic: Aortic atherosclerosis. No enlarged abdominal or pelvic lymph nodes. Reproductive: Status post hysterectomy. No adnexal masses. Other: No abdominal wall hernia or abnormality. No abdominopelvic ascites. Musculoskeletal: A total right hip replacement is seen with a marked amount of associated streak artifact. Subsequently limited evaluation of the adjacent osseous and soft tissue structures is noted. A chronic compression fracture deformity is seen at the level of T11. Multilevel degenerative changes are seen throughout the remainder of the lumbar spine. IMPRESSION: 1. Findings consistent with a partial small bowel obstruction versus ileus. 2. Sigmoid diverticulosis. 3. Total right hip replacement. 4. Chronic compression fracture deformity at the level of T11. 5. Aortic atherosclerosis. Aortic Atherosclerosis (ICD10-I70.0). Electronically Signed   By: TVirgina NorfolkM.D.   On: 02/21/2022 17:12     Scheduled Meds:  enoxaparin (LOVENOX) injection  30 mg Subcutaneous Q24H   insulin aspart  0-9 Units Subcutaneous Q4H   mometasone-formoterol  2 puff Inhalation BID   NIFEdipine  90 mg Oral q morning   olopatadine  1 drop Both Eyes BID   pravastatin  20 mg Oral q morning   Continuous Infusions:  sodium chloride 50 mL/hr at 02/22/22 0821   famotidine (PEPCID) IV       LOS: 1 day    CRoxan HockeyM.D on 02/22/2022 at 4:45 PM  Go to [email or web address] - for contact info  [facility] - Office   [phone] If 7PM-7AM, please contact night-coverage [email or web address] 02/22/2022, 4:45 PM

## 2022-02-22 NOTE — TOC Progression Note (Signed)
  Transition of Care [facility]) Screening Note   Patient Details  Name: Robin Mcclure Date of Birth: [birth year]   Transition of [facility]) CM/SW Contact:    Shade Flood, LCSW Phone Number: 02/22/2022, 11:22 AM    Received [facility] consult for PCP provider list. This will be brought to pt's room by Justice.   Transition of Care Department [facility]) has reviewed patient and no TOC needs have been identified at this time. We will continue to monitor patient advancement through interdisciplinary progression rounds. If new patient transition needs arise, please place a TOC consult.

## 2022-02-23 ENCOUNTER — Inpatient Hospital Stay (HOSPITAL_COMMUNITY): Payer: MEDICARE

## 2022-02-23 DIAGNOSIS — K56600 Partial intestinal obstruction, unspecified as to cause: Secondary | ICD-10-CM | POA: Diagnosis not present

## 2022-02-23 LAB — CBC
HCT: 35.1 % — ABNORMAL LOW (ref 36.0–46.0)
Hemoglobin: 11.1 g/dL — ABNORMAL LOW (ref 12.0–15.0)
MCH: 31.3 pg (ref 26.0–34.0)
MCHC: 31.6 g/dL (ref 30.0–36.0)
MCV: 98.9 fL (ref 80.0–100.0)
Platelets: 228 10*3/uL (ref 150–400)
RBC: 3.55 MIL/uL — ABNORMAL LOW (ref 3.87–5.11)
RDW: 13.7 % (ref 11.5–15.5)
WBC: 5.9 10*3/uL (ref 4.0–10.5)
nRBC: 0 % (ref 0.0–0.2)

## 2022-02-23 LAB — BASIC METABOLIC PANEL
Anion gap: 11 (ref 5–15)
BUN: 28 mg/dL — ABNORMAL HIGH (ref 8–23)
CO2: 18 mmol/L — ABNORMAL LOW (ref 22–32)
Calcium: 9.2 mg/dL (ref 8.9–10.3)
Chloride: 117 mmol/L — ABNORMAL HIGH (ref 98–111)
Creatinine, Ser: 0.86 mg/dL (ref 0.44–1.00)
GFR, Estimated: >60 mL/min (ref >60)
Glucose, Bld: 114 mg/dL — ABNORMAL HIGH (ref 70–99)
Potassium: 3.5 mmol/L (ref 3.5–5.1)
Sodium: 146 mmol/L — ABNORMAL HIGH (ref 135–145)

## 2022-02-23 LAB — GLUCOSE, CAPILLARY
Glucose-Capillary: 133 mg/dL — ABNORMAL HIGH (ref 70–99)
Glucose-Capillary: 145 mg/dL — ABNORMAL HIGH (ref 70–99)
Glucose-Capillary: 149 mg/dL — ABNORMAL HIGH (ref 70–99)
Glucose-Capillary: 151 mg/dL — ABNORMAL HIGH (ref 70–99)
Glucose-Capillary: 190 mg/dL — ABNORMAL HIGH (ref 70–99)
Glucose-Capillary: 98 mg/dL (ref 70–99)

## 2022-02-23 MED ORDER — SODIUM BICARBONATE 8.4 % IV SOLN
50.0000 meq | Freq: Once | INTRAVENOUS | Status: AC
  Administered 2022-02-23: 50 meq via INTRAVENOUS
  Filled 2022-02-23: qty 50

## 2022-02-23 MED ORDER — SODIUM CHLORIDE 0.9 % IV BOLUS
250.0000 mL | Freq: Once | INTRAVENOUS | Status: AC
  Administered 2022-02-23: 250 mL via INTRAVENOUS

## 2022-02-23 MED ORDER — SODIUM BICARBONATE 4.2 % IV SOLN
50.0000 meq | Freq: Once | INTRAVENOUS | Status: DC
  Filled 2022-02-23: qty 100

## 2022-02-23 MED ORDER — IOHEXOL 9 MG/ML PO SOLN
ORAL | Status: AC
  Administered 2022-02-23: 500 mL
  Filled 2022-02-23: qty 1000

## 2022-02-23 MED ORDER — SODIUM BICARBONATE 650 MG PO TABS
650.0000 mg | ORAL_TABLET | Freq: Two times a day (BID) | ORAL | Status: AC
  Administered 2022-02-23 – 2022-02-26 (×6): 650 mg via ORAL
  Filled 2022-02-23 (×6): qty 1

## 2022-02-23 MED ORDER — DEXTROSE-NACL 5-0.45 % IV SOLN: INTRAVENOUS | Status: DC

## 2022-02-23 MED ORDER — DM-GUAIFENESIN ER 30-600 MG PO TB12
1.0000 | ORAL_TABLET | Freq: Two times a day (BID) | ORAL | Status: DC
  Administered 2022-02-23 – 2022-03-02 (×15): 1 via ORAL
  Filled 2022-02-23 (×15): qty 1

## 2022-02-23 MED ORDER — IOHEXOL 300 MG/ML  SOLN
50.0000 mL | Freq: Once | INTRAMUSCULAR | Status: AC | PRN
  Administered 2022-02-23: 50 mL via INTRAVENOUS

## 2022-02-23 NOTE — Care Management Important Message (Signed)
Important Message  Patient Details  Name: Robin Mcclure MRN: [id number] Date of Birth: December 06, 1922   Medicare Important Message Given:  Yes     [name] 02/23/2022, 12:33 PM

## 2022-02-23 NOTE — Progress Notes (Signed)
PROGRESS NOTE     Robin Mcclure, is a 88 y.o. female, DOB - 07/22/1922, SVX:[id number]  Admit date - 02/21/2022   Admitting Physician Bernadette Hoit, DO  Outpatient Primary MD for the patient is [name], [name], MD  LOS - 2  Chief Complaint  Patient presents with   Emesis        Brief Narrative:   88 y.o. female with medical history significant of COPD, GERD, hypertension, hyperlipidemia, T2DM admitted on 02/21/2022 with intractable emesis and found to have partial small bowel obstruction  Versus ileus    -Assessment and Plan:  1)PSBO Vs Ileus--- abdominal and chest x-ray from 02/23/22 suggested possible abnormalities of the chest x-ray with regards to vascular structures of the chest--radiologist recommended CT with contrast -CT abdomen and pelvis and CT chest with contrast on 02/24/2019 showed no significant vascular abnormalities of the chest -Shows resolving small bowel obstruction versus ileus--with contrast in the colon -Discussed with general surgeon Dr. Constance Haw... She reviewed the images -Conservative management advised -We will clamp NG tube -Clear liquid diet -Dulcolax suppository -IV antiemetics as needed -IV fentanyl as needed -Repeat abdominal x-rays in a.m.  2)Hypomagnesemia--- replace and recheck, potassium and phosphorus WNL  3)DM2-A1c 6.4 reflecting excellent diabetic control PTA -Hold Januvia and metformin Use Novolog/Humalog Sliding scale insulin with Accu-Cheks/Fingersticks as ordered   4)HTN- Hold nifedipine  may use IV Hydralazine 10 mg  Every 4 hours Prn for systolic blood pressure over 160 mmhg  5)HyperNatremia/Hyperchloremia with non-anion gap metabolic Acidosis--- due to free water deficit/dehydration in the setting of small bowel obstruction on n.p.o. status -Patient received IV fluids and bicarb infusion prior to repeat contrast study on 02/23/2022 since patient already had a contrast study on 02/21/2022 -Continue to hydrate gently IV -Clear  liquid diet as ordered -Repeat BMP in a.m.  Prophylaxis:- -Lovenox for DVT prophylaxis and Pepcid for GI prophylaxis  Disposition/Need for in-Hospital Stay- patient unable to be discharged at this time due to - -continue IV fluids pending return of bowel function and tolerance of oral intake  Status is: Inpatient   Disposition: The patient is from: Home              Anticipated d/c is to: Home              Anticipated d/c date is: 2 days              Patient currently is not medically stable to d/c. Barriers: Not Clinically Stable-   Code Status :  -  Code Status: Full Code   Family Communication:    NA (patient is alert, awake and coherent)   DVT Prophylaxis  :   - SCDs   enoxaparin (LOVENOX) injection 30 mg Start: 02/22/22 1000 SCDs Start: 02/21/22 2316   Lab Results  Component Value Date   PLT 228 02/23/2022   Inpatient Medications  Scheduled Meds:  enoxaparin (LOVENOX) injection  30 mg Subcutaneous Q24H   insulin aspart  0-9 Units Subcutaneous Q4H   mometasone-formoterol  2 puff Inhalation BID   NIFEdipine  90 mg Oral q morning   olopatadine  1 drop Both Eyes BID   pravastatin  20 mg Oral q morning   sodium bicarbonate  650 mg Oral BID   Continuous Infusions:  dextrose 5 % and 0.45% NaCl 75 mL/hr at 02/23/22 1746   famotidine (PEPCID) IV 20 mg (02/23/22 1748)   PRN Meds:.albuterol, fentaNYL (SUBLIMAZE) injection, hydrALAZINE, ondansetron (ZOFRAN) IV   Anti-infectives (From admission, onward)  None        Subjective: Robin Mcclure today has no fevers, no further emesis,  No chest pain,   - -Patient had bowel movement with Dulcolax/enemas - abdominal and chest x-ray from 02/23/22 suggested possible abnormalities of the chest x-ray with regards to vascular structures of the chest--radiologist recommended CT with contrast - Postcontrast study patient willing to try liquid diet  Objective: Vitals:   02/23/22 0022 02/23/22 0538 02/23/22 0825 02/23/22  1245  BP: (!) 155/60 (!) 158/92  (!) 159/64  Pulse: 86 86  71  Resp: '18 20  20  '$ Temp: 99.1 F (37.3 C) 98.1 F (36.7 C)  99.2 F (37.3 C)  TempSrc: Oral Oral  Oral  SpO2: 95% 91% 90% 93%  Weight:      Height:        Intake/Output Summary (Last 24 hours) at 02/23/2022 1803 Last data filed at 02/23/2022 1652 Gross per 24 hour  Intake 1522.85 ml  Output 200 ml  Net 1322.85 ml   Filed Weights   02/21/22 1505 02/21/22 2145  Weight: 63.5 kg 55.6 kg   Physical Exam  Gen:- Awake Alert,  in no apparent distress  HEENT:- [place].AT, No sclera icterus Ears- HOH Nose- NG tube to be clamped Neck-Supple Neck,No JVD,.  Lungs-  CTAB , fair symmetrical air movement CV- S1, S2 normal, regular  Abd-  +ve B.Sounds, Abd Soft, No significant tenderness,    Extremity/Skin:- No  edema, pedal pulses present  Psych-affect is appropriate, oriented x3 Neuro-no new focal deficits, no tremors  Data Reviewed: I have personally reviewed following labs and imaging studies  CBC: Recent Labs  Lab 02/21/22 1534 02/22/22 0029 02/23/22 0422  WBC 5.3 3.9* 5.9  NEUTROABS 3.5  --   --   HGB 12.4 11.9* 11.1*  HCT 37.5 35.9* 35.1*  MCV 94.7 95.7 98.9  PLT 247 234 259   Basic Metabolic Panel: Recent Labs  Lab 02/21/22 1534 02/22/22 0029 02/23/22 0422  NA 138 139 146*  K 3.9 3.7 3.5  CL 107 111 117*  CO2 19* 20* 18*  GLUCOSE 221* 186* 114*  BUN 28* 29* 28*  CREATININE 0.97 0.97 0.86  CALCIUM 9.6 9.0 9.2  MG  --  1.5*  --   PHOS  --  2.9  --    GFR: Estimated Creatinine Clearance: 32.8 mL/min (by C-G formula based on SCr of 0.86 mg/dL). Liver Function Tests: Recent Labs  Lab 02/21/22 1534 02/22/22 0029  AST 12* 11*  ALT 12 12  ALKPHOS 66 58  BILITOT 1.3* 1.1  PROT 6.6 6.1*  ALBUMIN 3.2* 3.0*   HbA1C: Recent Labs    02/22/22 0029  HGBA1C 6.4*   Radiology Studies: CT CHEST W CONTRAST  Result Date: 02/23/2022 CLINICAL DATA:  Intractable emesis, partial small bowel obstruction  versus ileus, abnormal density right upper chest EXAM: CT CHEST, ABDOMEN, AND PELVIS WITH CONTRAST TECHNIQUE: Multidetector CT imaging of the chest, abdomen and pelvis was performed following the standard protocol during bolus administration of intravenous contrast. RADIATION DOSE REDUCTION: This exam was performed according to the departmental dose-optimization program which includes automated exposure control, adjustment of the mA and/or kV according to patient size and/or use of iterative reconstruction technique. CONTRAST:  67m OMNIPAQUE IOHEXOL 300 MG/ML  SOLN COMPARISON:  02/23/2022, 02/21/2022 FINDINGS: CT CHEST FINDINGS Cardiovascular: Heart is enlarged without pericardial effusion. Extensive atherosclerosis of the coronary vasculature unchanged. No evidence of thoracic aortic aneurysm or dissection. Stable aortic atherosclerosis. Dilated main pulmonary trunk  consistent with pulmonary arterial hypertension. Mediastinum/Nodes: Enteric catheter extends into the gastric lumen, tip in the region of the duodenal bulb. Thyroid and trachea are unremarkable. No pathologic adenopathy. Lungs/Pleura: No airspace disease, effusion, or pneumothorax. Density in the right upper chest on preceding x-ray likely reflect a superimposed vascular shadow given patient rotation. Central airways are patent. Musculoskeletal: There are no acute displaced fractures. Chronic compression deformities are seen at T4, T5, and T11. Reconstructed images demonstrate no additional findings. CT ABDOMEN PELVIS FINDINGS Hepatobiliary: No focal liver abnormality is seen. Status post cholecystectomy. No biliary dilatation. Pancreas: Pancreas is atrophic. No focal abnormalities or pancreatic duct dilation. Spleen: Normal in size without focal abnormality. Adrenals/Urinary Tract: Kidneys are unremarkable without urinary tract calculi or obstructive uropathy. The adrenals and bladder are grossly unremarkable. Evaluation of the bladder slightly  limited due to streak artifact from right hip arthroplasty. Stomach/Bowel: There has been transit of oral contrast into the colon by the time of imaging, excluding high-grade obstruction. There are some residual dilated loops of small bowel within the left mid abdomen, which may be due to resolving obstruction or ileus. No bowel wall thickening or inflammatory change. Vascular/Lymphatic: Aortic atherosclerosis. No enlarged abdominal or pelvic lymph nodes. Reproductive: Status post hysterectomy. No adnexal masses. Other: No free fluid or free intraperitoneal gas. No abdominal wall hernia. Musculoskeletal: There are no acute or destructive bony lesions. Right hip arthroplasty is unremarkable. Reconstructed images demonstrate no additional findings. IMPRESSION: 1. Resolving small-bowel obstruction or ileus, with transit of oral contrast into the colon at the time of imaging. Minimal residual small bowel dilatation in the left mid abdomen. 2. No acute intrathoracic process. The density seen in the right upper chest on preceding x-ray is consistent with superimposed vascular shadows given patient rotation on that exam. 3. Cardiomegaly. 4. Aortic Atherosclerosis (ICD10-I70.0). Coronary artery atherosclerosis. Electronically Signed   By: Randa Ngo M.D.   On: 02/23/2022 16:45   CT ABDOMEN PELVIS W CONTRAST  Result Date: 02/23/2022 CLINICAL DATA:  Intractable emesis, partial small bowel obstruction versus ileus, abnormal density right upper chest EXAM: CT CHEST, ABDOMEN, AND PELVIS WITH CONTRAST TECHNIQUE: Multidetector CT imaging of the chest, abdomen and pelvis was performed following the standard protocol during bolus administration of intravenous contrast. RADIATION DOSE REDUCTION: This exam was performed according to the departmental dose-optimization program which includes automated exposure control, adjustment of the mA and/or kV according to patient size and/or use of iterative reconstruction technique.  CONTRAST:  7m OMNIPAQUE IOHEXOL 300 MG/ML  SOLN COMPARISON:  02/23/2022, 02/21/2022 FINDINGS: CT CHEST FINDINGS Cardiovascular: Heart is enlarged without pericardial effusion. Extensive atherosclerosis of the coronary vasculature unchanged. No evidence of thoracic aortic aneurysm or dissection. Stable aortic atherosclerosis. Dilated main pulmonary trunk consistent with pulmonary arterial hypertension. Mediastinum/Nodes: Enteric catheter extends into the gastric lumen, tip in the region of the duodenal bulb. Thyroid and trachea are unremarkable. No pathologic adenopathy. Lungs/Pleura: No airspace disease, effusion, or pneumothorax. Density in the right upper chest on preceding x-ray likely reflect a superimposed vascular shadow given patient rotation. Central airways are patent. Musculoskeletal: There are no acute displaced fractures. Chronic compression deformities are seen at T4, T5, and T11. Reconstructed images demonstrate no additional findings. CT ABDOMEN PELVIS FINDINGS Hepatobiliary: No focal liver abnormality is seen. Status post cholecystectomy. No biliary dilatation. Pancreas: Pancreas is atrophic. No focal abnormalities or pancreatic duct dilation. Spleen: Normal in size without focal abnormality. Adrenals/Urinary Tract: Kidneys are unremarkable without urinary tract calculi or obstructive uropathy. The adrenals and bladder are  grossly unremarkable. Evaluation of the bladder slightly limited due to streak artifact from right hip arthroplasty. Stomach/Bowel: There has been transit of oral contrast into the colon by the time of imaging, excluding high-grade obstruction. There are some residual dilated loops of small bowel within the left mid abdomen, which may be due to resolving obstruction or ileus. No bowel wall thickening or inflammatory change. Vascular/Lymphatic: Aortic atherosclerosis. No enlarged abdominal or pelvic lymph nodes. Reproductive: Status post hysterectomy. No adnexal masses. Other: No  free fluid or free intraperitoneal gas. No abdominal wall hernia. Musculoskeletal: There are no acute or destructive bony lesions. Right hip arthroplasty is unremarkable. Reconstructed images demonstrate no additional findings. IMPRESSION: 1. Resolving small-bowel obstruction or ileus, with transit of oral contrast into the colon at the time of imaging. Minimal residual small bowel dilatation in the left mid abdomen. 2. No acute intrathoracic process. The density seen in the right upper chest on preceding x-ray is consistent with superimposed vascular shadows given patient rotation on that exam. 3. Cardiomegaly. 4. Aortic Atherosclerosis (ICD10-I70.0). Coronary artery atherosclerosis. Electronically Signed   By: Randa Ngo M.D.   On: 02/23/2022 16:45   DG ABD ACUTE 2+V W 1V CHEST  Result Date: 02/23/2022 CLINICAL DATA:  Small-bowel obstruction. EXAM: DG ABDOMEN ACUTE WITH 1 VIEW CHEST COMPARISON:  February 21, 2022 FINDINGS: Enteric tube courses into the abdomen. Tip likely in the area of the proximal duodenum. Trachea midline. Cardiomediastinal contours and hilar structures are stable with signs of cardiomegaly. No consolidation. Subtle added density over the RIGHT upper chest. This is about the third and fourth ribs posteriorly and the clavicle, an area of significant overlap but is asymmetric to the other side. No pneumothorax. No free air beneath either the RIGHT or LEFT hemidiaphragm. Scattered loops of gas-filled bowel throughout the abdomen. Degree of distension slightly improved compared to previous imaging still with some mild to moderate distension of bowel loops in the LEFT mid abdomen. Post partial colonic resection as before. Stool and gas scattered throughout the colon with gas in the rectum. Degenerative changes throughout the spine. RIGHT hip arthroplasty. Osteopenia. IMPRESSION: 1. Enteric tube courses into the abdomen. Tip likely in the area of the proximal duodenum. 2. Scattered loops of  gas-filled bowel throughout the abdomen. Degree of distension slightly improved compared to previous imaging still with some mild to moderate distension of bowel loops in the LEFT mid abdomen. 3. No free air. 4. Added density over the RIGHT upper chest, image slightly rotated and could project vascular structures over the upper chest. Density is however more than expected just above the RIGHT hilum and underlying nodule up to 2.3 cm is considered. Given nonstandard positioning currently would suggest CT of the chest for further evaluation on follow-up. Electronically Signed   By: Zetta Bills M.D.   On: 02/23/2022 08:12   US Venous Img Lower Unilateral Right (DVT)  Result Date: 02/22/2022 CLINICAL DATA:  Right leg swelling EXAM: RIGHT LOWER EXTREMITY VENOUS DOPPLER ULTRASOUND TECHNIQUE: Gray-scale sonography with compression, as well as color and duplex ultrasound, were performed to evaluate the deep venous system(s) from the level of the common femoral vein through the popliteal and proximal calf veins. COMPARISON:  None available FINDINGS: VENOUS Normal compressibility of the common femoral, superficial femoral, and popliteal veins, as well as the visualized calf veins. Visualized portions of profunda femoral vein and great saphenous vein unremarkable. No filling defects to suggest DVT on grayscale or color Doppler imaging. Doppler waveforms show normal direction of venous  flow, normal respiratory plasticity and response to augmentation. Limited views of the contralateral common femoral vein are unremarkable. OTHER None. Limitations: none IMPRESSION: No right lower extremity DVT. Electronically Signed   By: Miachel Roux M.D.   On: 02/22/2022 13:22   DG Chest Portable 1 View  Result Date: 02/21/2022 CLINICAL DATA:  Enteric catheter placement EXAM: PORTABLE CHEST 1 VIEW COMPARISON:  08/18/2020 FINDINGS: Single frontal view of the chest demonstrates enteric catheter passing below diaphragm, tip excluded by  collimation. Side port projects over the gastric fundus. Cardiac silhouette is enlarged but stable. No airspace disease, effusion, or pneumothorax. No acute bony abnormality. IMPRESSION: 1. Enteric catheter coiled over the stomach, tip excluded by collimation. The side port projects over the gastric fundus. Electronically Signed   By: Randa Ngo M.D.   On: 02/21/2022 20:01   DG Abd Portable 1 View  Result Date: 02/21/2022 CLINICAL DATA:  Enteric catheter placement EXAM: PORTABLE ABDOMEN - 1 VIEW COMPARISON:  02/21/2022 FINDINGS: Frontal view of the lower chest and upper abdomen demonstrates enteric catheter passing below diaphragm, looped back upon itself with tip and side port extending retrograde in the region of the distal thoracic esophagus. Recommend removal and replacement. Lung bases are clear. Gaseous distention of the small bowel consistent with obstruction as seen on recent CT. IMPRESSION: 1. Enteric catheter as above, coiled back upon itself with tip projecting over the distal thoracic esophagus. Recommend removal and replacement. 2. Small bowel obstruction. Electronically Signed   By: Randa Ngo M.D.   On: 02/21/2022 18:34    Scheduled Meds:  enoxaparin (LOVENOX) injection  30 mg Subcutaneous Q24H   insulin aspart  0-9 Units Subcutaneous Q4H   mometasone-formoterol  2 puff Inhalation BID   NIFEdipine  90 mg Oral q morning   olopatadine  1 drop Both Eyes BID   pravastatin  20 mg Oral q morning   sodium bicarbonate  650 mg Oral BID   Continuous Infusions:  dextrose 5 % and 0.45% NaCl 75 mL/hr at 02/23/22 1746   famotidine (PEPCID) IV 20 mg (02/23/22 1748)    LOS: 2 days   Roxan Hockey M.D on 02/23/2022 at 6:03 PM  Go to [email or web address] - for contact info  [facility] - Office  [phone]  If 7PM-7AM, please contact night-coverage [email or web address] 02/23/2022, 6:03 PM

## 2022-02-24 ENCOUNTER — Inpatient Hospital Stay (HOSPITAL_COMMUNITY): Payer: MEDICARE

## 2022-02-24 DIAGNOSIS — K56600 Partial intestinal obstruction, unspecified as to cause: Secondary | ICD-10-CM | POA: Diagnosis not present

## 2022-02-24 LAB — BLOOD GAS, ARTERIAL
Acid-base deficit: 3.2 mmol/L — ABNORMAL HIGH (ref 0.0–2.0)
Bicarbonate: 23.6 mmol/L (ref 20.0–28.0)
Drawn by: 27016
FIO2: 54 %
O2 Saturation: 76.7 %
Patient temperature: 37
pCO2 arterial: 48 mmHg (ref 32–48)
pH, Arterial: 7.3 — ABNORMAL LOW (ref 7.35–7.45)
pO2, Arterial: 44 mmHg — ABNORMAL LOW (ref 83–108)

## 2022-02-24 LAB — BASIC METABOLIC PANEL
Anion gap: 10 (ref 5–15)
BUN: 19 mg/dL (ref 8–23)
CO2: 20 mmol/L — ABNORMAL LOW (ref 22–32)
Calcium: 8.9 mg/dL (ref 8.9–10.3)
Chloride: 109 mmol/L (ref 98–111)
Creatinine, Ser: 0.69 mg/dL (ref 0.44–1.00)
GFR, Estimated: >60 mL/min (ref >60)
Glucose, Bld: 198 mg/dL — ABNORMAL HIGH (ref 70–99)
Potassium: 3 mmol/L — ABNORMAL LOW (ref 3.5–5.1)
Sodium: 139 mmol/L (ref 135–145)

## 2022-02-24 LAB — GLUCOSE, CAPILLARY
Glucose-Capillary: 121 mg/dL — ABNORMAL HIGH (ref 70–99)
Glucose-Capillary: 128 mg/dL — ABNORMAL HIGH (ref 70–99)
Glucose-Capillary: 143 mg/dL — ABNORMAL HIGH (ref 70–99)
Glucose-Capillary: 180 mg/dL — ABNORMAL HIGH (ref 70–99)
Glucose-Capillary: 182 mg/dL — ABNORMAL HIGH (ref 70–99)
Glucose-Capillary: 199 mg/dL — ABNORMAL HIGH (ref 70–99)

## 2022-02-24 MED ORDER — FUROSEMIDE 10 MG/ML IJ SOLN
20.0000 mg | Freq: Once | INTRAMUSCULAR | Status: AC
  Administered 2022-02-24: 20 mg via INTRAVENOUS
  Filled 2022-02-24: qty 2

## 2022-02-24 MED ORDER — ORAL CARE MOUTH RINSE
15.0000 mL | OROMUCOSAL | Status: DC
  Administered 2022-02-24 – 2022-03-05 (×31): 15 mL via OROMUCOSAL

## 2022-02-24 MED ORDER — AMOXICILLIN-POT CLAVULANATE 400-57 MG/5ML PO SUSR
875.0000 mg | Freq: Two times a day (BID) | ORAL | Status: DC
  Filled 2022-02-24 (×5): qty 10.9

## 2022-02-24 MED ORDER — ALBUTEROL SULFATE (2.5 MG/3ML) 0.083% IN NEBU
INHALATION_SOLUTION | RESPIRATORY_TRACT | Status: AC
  Filled 2022-02-24: qty 3

## 2022-02-24 MED ORDER — ORAL CARE MOUTH RINSE: 15.0000 mL | OROMUCOSAL | Status: DC | PRN

## 2022-02-24 MED ORDER — ACETAMINOPHEN 325 MG PO TABS
650.0000 mg | ORAL_TABLET | Freq: Four times a day (QID) | ORAL | Status: DC | PRN
  Administered 2022-02-24: 650 mg via ORAL
  Filled 2022-02-24: qty 2

## 2022-02-24 MED ORDER — ACETAMINOPHEN 650 MG RE SUPP: 650.0000 mg | RECTAL | Status: DC | PRN

## 2022-02-24 MED ORDER — CHLORHEXIDINE GLUCONATE CLOTH 2 % EX PADS
6.0000 | MEDICATED_PAD | Freq: Every day | CUTANEOUS | Status: DC
  Administered 2022-02-24 – 2022-03-05 (×10): 6 via TOPICAL

## 2022-02-24 MED ORDER — AMOXICILLIN-POT CLAVULANATE 200-28.5 MG/5ML PO SUSR
875.0000 mg | Freq: Two times a day (BID) | ORAL | Status: DC
  Administered 2022-02-24 – 2022-02-25 (×4): 876 mg
  Filled 2022-02-24 (×9): qty 25

## 2022-02-24 MED ORDER — ENOXAPARIN SODIUM 40 MG/0.4ML IJ SOSY
40.0000 mg | PREFILLED_SYRINGE | INTRAMUSCULAR | Status: DC
  Administered 2022-02-25 – 2022-03-05 (×9): 40 mg via SUBCUTANEOUS
  Filled 2022-02-24 (×9): qty 0.4

## 2022-02-24 MED ORDER — ALBUTEROL SULFATE (2.5 MG/3ML) 0.083% IN NEBU: 2.5000 mg | INHALATION_SOLUTION | RESPIRATORY_TRACT | Status: DC | PRN

## 2022-02-24 MED ORDER — BISACODYL 10 MG RE SUPP
10.0000 mg | Freq: Once | RECTAL | Status: AC
  Administered 2022-02-24: 10 mg via RECTAL
  Filled 2022-02-24: qty 1

## 2022-02-24 MED ORDER — IPRATROPIUM-ALBUTEROL 0.5-2.5 (3) MG/3ML IN SOLN
3.0000 mL | Freq: Once | RESPIRATORY_TRACT | Status: AC
  Administered 2022-02-24: 3 mL via RESPIRATORY_TRACT
  Filled 2022-02-24: qty 3

## 2022-02-24 MED ORDER — GUAIFENESIN-DM 100-10 MG/5ML PO SYRP
15.0000 mL | ORAL_SOLUTION | Freq: Once | ORAL | Status: AC
  Administered 2022-02-24: 15 mL via ORAL
  Filled 2022-02-24: qty 15

## 2022-02-24 MED ORDER — ALBUTEROL SULFATE (2.5 MG/3ML) 0.083% IN NEBU
2.5000 mg | INHALATION_SOLUTION | RESPIRATORY_TRACT | Status: DC | PRN
  Administered 2022-02-24 – 2022-03-04 (×10): 2.5 mg via RESPIRATORY_TRACT
  Filled 2022-02-24 (×10): qty 3

## 2022-02-24 MED ORDER — POTASSIUM CHLORIDE CRYS ER 20 MEQ PO TBCR
40.0000 meq | EXTENDED_RELEASE_TABLET | ORAL | Status: AC
  Administered 2022-02-24 (×2): 40 meq via ORAL
  Filled 2022-02-24 (×2): qty 2

## 2022-02-24 NOTE — Progress Notes (Signed)
PROGRESS NOTE     Robin Mcclure, is a 88 y.o. female, DOB - [birth year], WCB:[id number]  Admit date - 02/21/2022   Admitting Physician Bernadette Hoit, DO  Outpatient Primary MD for the patient is Mcclure, [name], MD  LOS - 3  Chief Complaint  Patient presents with   Emesis        Brief Narrative:   88 y.o. female with medical history significant of COPD, GERD, hypertension, hyperlipidemia, T2DM admitted on 02/21/2022 with intractable emesis and found to have partial small bowel obstruction  Versus ileus    -Assessment and Plan:  1)PSBO Vs Ileus--- abdominal and chest x-ray from 02/23/22 suggested possible abnormalities of the chest x-ray with regards to vascular structures of the chest--radiologist recommended CT with contrast -CT abdomen and pelvis and CT chest with contrast on 02/23/2022 showed no significant vascular abnormalities of the chest---Shows resolving small bowel obstruction versus ileus--with contrast in the colon -Discussed with general surgeon Dr. Constance Haw... She reviewed the images -Conservative management advised -Follow-up abdominal x-rays on 02/24/2022 continues to show resolving SBO -Keep NG tube clamped -N.p.o. except for ice chips due to concerns for possible aspiration episode on 02/24/2022 -Dulcolax suppository -IV antiemetics as needed -IV fentanyl as needed -Repeat abdominal x-rays in a.m.  2)Hypomagnesemia/hypokalemia --- replace and recheck,    3)acute hypoxic respiratory failure with concern for possible aspiration - -in the early hours of 02/24/2022 patient developed significant respiratory distress with hypoxia O2 sats down to the 60s -Required deep suctioning, bronchodilators, high flow oxygen -Diuresis Arterial Blood Gas result:  pO2 44; pCO2 48; pH 7.3;  HCO3 23.6, %O2 Sat 76.8 on 8 L of [place] -Currently requiring up to 10 L of oxygen via nasal cannula -Empirically start Augmentin -Continue mucolytics, bronchodilators and supplemental oxygen -Get  speech evaluation -N.p.o. except for ice chips and sips -Keep NG tube clamped -Transfer to ICU/stepdown for BiPAP if respiratory status decompensates further -Repeat x-rays in a.m.  4)HTN- Hold nifedipine  may use IV Hydralazine 10 mg  Every 4 hours Prn for systolic blood pressure over 160 mmhg  5)HyperNatremia/Hyperchloremia with non-anion gap metabolic Acidosis--- due to free water deficit/dehydration in the setting of small bowel obstruction on n.p.o. status -Patient received IV fluids and bicarb infusion prior to repeat contrast study on 02/23/2022 since patient already had a contrast study on 61/10/735 -Metabolic acidosis improving -Repeat BMP in a.m.  6)DM2-A1c 6.4 reflecting excellent diabetic control PTA -Hold Januvia and metformin Use Novolog/Humalog Sliding scale insulin with Accu-Cheks/Fingersticks as ordered   7)Social/ethics--- plan of care and advanced directive discussed with patient and patient's daughter Robin Mcclure -Patient is a full code -Okay for BiPAP  Prophylaxis:- -Lovenox for DVT prophylaxis and Pepcid for GI prophylaxis  Disposition/Need for in-Hospital Stay- patient unable to be discharged at this time due to - -acute hypoxic respiratory failure with concern for possible aspiration requiring further investigations and interventions  Status is: Inpatient   Disposition: The patient is from: Home              Anticipated d/c is to: Home              Anticipated d/c date is: 2 days              Patient currently is not medically stable to d/c. Barriers: Not Clinically Stable-   Code Status :  -  Code Status: Full Code   Family Communication:    NA (patient is alert, awake and coherent)   DVT Prophylaxis  :   -  SCDs   enoxaparin (LOVENOX) injection 40 mg Start: 02/25/22 1000 SCDs Start: 02/21/22 2316   Lab Results  Component Value Date   PLT 228 02/23/2022   Inpatient Medications  Scheduled Meds:  albuterol       bisacodyl  10 mg Rectal  Once   dextromethorphan-guaiFENesin  1 tablet Oral BID   [START ON 02/25/2022] enoxaparin (LOVENOX) injection  40 mg Subcutaneous Q24H   insulin aspart  0-9 Units Subcutaneous Q4H   mometasone-formoterol  2 puff Inhalation BID   NIFEdipine  90 mg Oral q morning   olopatadine  1 drop Both Eyes BID   potassium chloride  40 mEq Oral Q3H   pravastatin  20 mg Oral q morning   sodium bicarbonate  650 mg Oral BID   Continuous Infusions:  dextrose 5 % and 0.45% NaCl 10 mL/hr at 02/24/22 0904   famotidine (PEPCID) IV Stopped (02/23/22 1818)   PRN Meds:.albuterol, albuterol, albuterol, fentaNYL (SUBLIMAZE) injection, hydrALAZINE, ondansetron (ZOFRAN) IV   Anti-infectives (From admission, onward)    None        Subjective: [name] today has no fevers, no further emesis,  No chest pain,   - -Started on clear liquid diet yesterday.... This morning sounds very gurgly, hypoxic with O2 sats in the 60s on room air O2 sats dropped to the 80s on 10 L -No chest pains, no pleuritic symptoms Patient's daughter at bedside- -respiratory therapist and LPN at bedside -Patient received bronchodilators, suctioning, IV Lasix mucolytics and high flow oxygen -Hypoxia persist -Transferring to stepdown for BiPAP  Objective: Vitals:   02/24/22 0843 02/24/22 0856 02/24/22 0925 02/24/22 1046  BP:  (!) 143/95  (!) 149/80  Pulse:  82  82  Resp:      Temp:      TempSrc:      SpO2: (!) 79% (!) 88% 91% 90%  Weight:      Height:        Intake/Output Summary (Last 24 hours) at 02/24/2022 1358 Last data filed at 02/24/2022 0904 Gross per 24 hour  Intake 1841.89 ml  Output 500 ml  Net 1341.89 ml   Filed Weights   02/21/22 1505 02/21/22 2145  Weight: 63.5 kg 55.6 kg   Physical Exam  Gen:- Awake Alert, tachypnea and respiratory distress HEENT:- [place].AT, No sclera icterus Ears- HOH Nose- NG tube to be clamped/[place] 10L/min/Bipap Neck-Supple Neck,No JVD,.  Lungs-diminished breath sounds, sounds very  gurgly , scattered rhonchi and wheezes bilaterally -CV- S1, S2 normal, regular  Abd-  +ve B.Sounds, Abd Soft, No significant tenderness,    Extremity/Skin:-Trace edema, pedal pulses present  Psych-affect is appropriate, oriented x3 Neuro-generalized weakness, no new focal deficits, no tremors  Data Reviewed: I have personally reviewed following labs and imaging studies  CBC: Recent Labs  Lab 02/21/22 1534 02/22/22 0029 02/23/22 0422  WBC 5.3 3.9* 5.9  NEUTROABS 3.5  --   --   HGB 12.4 11.9* 11.1*  HCT 37.5 35.9* 35.1*  MCV 94.7 95.7 98.9  PLT 247 234 588   Basic Metabolic Panel: Recent Labs  Lab 02/21/22 1534 02/22/22 0029 02/23/22 0422 02/24/22 0810  NA 138 139 146* 139  K 3.9 3.7 3.5 3.0*  CL 107 111 117* 109  CO2 19* 20* 18* 20*  GLUCOSE 221* 186* 114* 198*  BUN 28* 29* 28* 19  CREATININE 0.97 0.97 0.86 0.69  CALCIUM 9.6 9.0 9.2 8.9  MG  --  1.5*  --   --   PHOS  --  2.9  --   --    GFR: Estimated Creatinine Clearance: 35.3 mL/min (by C-G formula based on SCr of 0.69 mg/dL). Liver Function Tests: Recent Labs  Lab 02/21/22 1534 02/22/22 0029  AST 12* 11*  ALT 12 12  ALKPHOS 66 58  BILITOT 1.3* 1.1  PROT 6.6 6.1*  ALBUMIN 3.2* 3.0*   HbA1C: Recent Labs    02/22/22 0029  HGBA1C 6.4*   Radiology Studies: DG ABD ACUTE 2+V W 1V CHEST  Result Date: 02/24/2022 CLINICAL DATA:  88 year old female with resolving small bowel obstruction on CT yesterday, oral contrast reached the colon. EXAM: DG ABDOMEN ACUTE WITH 1 VIEW CHEST COMPARISON:  CT Chest, Abdomen, and Pelvis yesterday and earlier. FINDINGS: Portable upright AP view of the chest at 0719 hours. Enteric tube terminates in the distal stomach or duodenum, side hole at the level of the gastric body. Stable cholecystectomy clips. No pneumoperitoneum or pneumothorax. Stable lung bases and mediastinum. Portable upright and supine views the abdomen at 0719 hours. Retained oral contrast appears to be largely  within the colon now from the transverse to the sigmoid. Excreted IV contrast in the urinary bladder. Superimposed gas-filled small bowel loops up to 3.5 cm diameter are not significantly changed from the CT yesterday. Right hip arthroplasty.  No acute osseous abnormality identified. IMPRESSION: 1. Stable bowel gas pattern from the CT yesterday. Retained oral contrast now primarily in the colon. No free air. 2. Stable chest. Enteric tube terminates in the distal stomach or duodenum. Electronically Signed   By: Genevie Ann M.D.   On: 02/24/2022 09:21   CT CHEST W CONTRAST  Result Date: 02/23/2022 CLINICAL DATA:  Intractable emesis, partial small bowel obstruction versus ileus, abnormal density right upper chest EXAM: CT CHEST, ABDOMEN, AND PELVIS WITH CONTRAST TECHNIQUE: Multidetector CT imaging of the chest, abdomen and pelvis was performed following the standard protocol during bolus administration of intravenous contrast. RADIATION DOSE REDUCTION: This exam was performed according to the departmental dose-optimization program which includes automated exposure control, adjustment of the mA and/or kV according to patient size and/or use of iterative reconstruction technique. CONTRAST:  78m OMNIPAQUE IOHEXOL 300 MG/ML  SOLN COMPARISON:  02/23/2022, 02/21/2022 FINDINGS: CT CHEST FINDINGS Cardiovascular: Heart is enlarged without pericardial effusion. Extensive atherosclerosis of the coronary vasculature unchanged. No evidence of thoracic aortic aneurysm or dissection. Stable aortic atherosclerosis. Dilated main pulmonary trunk consistent with pulmonary arterial hypertension. Mediastinum/Nodes: Enteric catheter extends into the gastric lumen, tip in the region of the duodenal bulb. Thyroid and trachea are unremarkable. No pathologic adenopathy. Lungs/Pleura: No airspace disease, effusion, or pneumothorax. Density in the right upper chest on preceding x-ray likely reflect a superimposed vascular shadow given patient  rotation. Central airways are patent. Musculoskeletal: There are no acute displaced fractures. Chronic compression deformities are seen at T4, T5, and T11. Reconstructed images demonstrate no additional findings. CT ABDOMEN PELVIS FINDINGS Hepatobiliary: No focal liver abnormality is seen. Status post cholecystectomy. No biliary dilatation. Pancreas: Pancreas is atrophic. No focal abnormalities or pancreatic duct dilation. Spleen: Normal in size without focal abnormality. Adrenals/Urinary Tract: Kidneys are unremarkable without urinary tract calculi or obstructive uropathy. The adrenals and bladder are grossly unremarkable. Evaluation of the bladder slightly limited due to streak artifact from right hip arthroplasty. Stomach/Bowel: There has been transit of oral contrast into the colon by the time of imaging, excluding high-grade obstruction. There are some residual dilated loops of small bowel within the left mid abdomen, which may be due to resolving obstruction or  ileus. No bowel wall thickening or inflammatory change. Vascular/Lymphatic: Aortic atherosclerosis. No enlarged abdominal or pelvic lymph nodes. Reproductive: Status post hysterectomy. No adnexal masses. Other: No free fluid or free intraperitoneal gas. No abdominal wall hernia. Musculoskeletal: There are no acute or destructive bony lesions. Right hip arthroplasty is unremarkable. Reconstructed images demonstrate no additional findings. IMPRESSION: 1. Resolving small-bowel obstruction or ileus, with transit of oral contrast into the colon at the time of imaging. Minimal residual small bowel dilatation in the left mid abdomen. 2. No acute intrathoracic process. The density seen in the right upper chest on preceding x-ray is consistent with superimposed vascular shadows given patient rotation on that exam. 3. Cardiomegaly. 4. Aortic Atherosclerosis (ICD10-I70.0). Coronary artery atherosclerosis. Electronically Signed   By: Randa Ngo M.D.   On:  02/23/2022 16:45   CT ABDOMEN PELVIS W CONTRAST  Result Date: 02/23/2022 CLINICAL DATA:  Intractable emesis, partial small bowel obstruction versus ileus, abnormal density right upper chest EXAM: CT CHEST, ABDOMEN, AND PELVIS WITH CONTRAST TECHNIQUE: Multidetector CT imaging of the chest, abdomen and pelvis was performed following the standard protocol during bolus administration of intravenous contrast. RADIATION DOSE REDUCTION: This exam was performed according to the departmental dose-optimization program which includes automated exposure control, adjustment of the mA and/or kV according to patient size and/or use of iterative reconstruction technique. CONTRAST:  31m OMNIPAQUE IOHEXOL 300 MG/ML  SOLN COMPARISON:  02/23/2022, 02/21/2022 FINDINGS: CT CHEST FINDINGS Cardiovascular: Heart is enlarged without pericardial effusion. Extensive atherosclerosis of the coronary vasculature unchanged. No evidence of thoracic aortic aneurysm or dissection. Stable aortic atherosclerosis. Dilated main pulmonary trunk consistent with pulmonary arterial hypertension. Mediastinum/Nodes: Enteric catheter extends into the gastric lumen, tip in the region of the duodenal bulb. Thyroid and trachea are unremarkable. No pathologic adenopathy. Lungs/Pleura: No airspace disease, effusion, or pneumothorax. Density in the right upper chest on preceding x-ray likely reflect a superimposed vascular shadow given patient rotation. Central airways are patent. Musculoskeletal: There are no acute displaced fractures. Chronic compression deformities are seen at T4, T5, and T11. Reconstructed images demonstrate no additional findings. CT ABDOMEN PELVIS FINDINGS Hepatobiliary: No focal liver abnormality is seen. Status post cholecystectomy. No biliary dilatation. Pancreas: Pancreas is atrophic. No focal abnormalities or pancreatic duct dilation. Spleen: Normal in size without focal abnormality. Adrenals/Urinary Tract: Kidneys are unremarkable  without urinary tract calculi or obstructive uropathy. The adrenals and bladder are grossly unremarkable. Evaluation of the bladder slightly limited due to streak artifact from right hip arthroplasty. Stomach/Bowel: There has been transit of oral contrast into the colon by the time of imaging, excluding high-grade obstruction. There are some residual dilated loops of small bowel within the left mid abdomen, which may be due to resolving obstruction or ileus. No bowel wall thickening or inflammatory change. Vascular/Lymphatic: Aortic atherosclerosis. No enlarged abdominal or pelvic lymph nodes. Reproductive: Status post hysterectomy. No adnexal masses. Other: No free fluid or free intraperitoneal gas. No abdominal wall hernia. Musculoskeletal: There are no acute or destructive bony lesions. Right hip arthroplasty is unremarkable. Reconstructed images demonstrate no additional findings. IMPRESSION: 1. Resolving small-bowel obstruction or ileus, with transit of oral contrast into the colon at the time of imaging. Minimal residual small bowel dilatation in the left mid abdomen. 2. No acute intrathoracic process. The density seen in the right upper chest on preceding x-ray is consistent with superimposed vascular shadows given patient rotation on that exam. 3. Cardiomegaly. 4. Aortic Atherosclerosis (ICD10-I70.0). Coronary artery atherosclerosis. Electronically Signed   By: MDiana EvesD.  On: 02/23/2022 16:45   DG ABD ACUTE 2+V W 1V CHEST  Result Date: 02/23/2022 CLINICAL DATA:  Small-bowel obstruction. EXAM: DG ABDOMEN ACUTE WITH 1 VIEW CHEST COMPARISON:  February 21, 2022 FINDINGS: Enteric tube courses into the abdomen. Tip likely in the area of the proximal duodenum. Trachea midline. Cardiomediastinal contours and hilar structures are stable with signs of cardiomegaly. No consolidation. Subtle added density over the RIGHT upper chest. This is about the third and fourth ribs posteriorly and the clavicle, an  area of significant overlap but is asymmetric to the other side. No pneumothorax. No free air beneath either the RIGHT or LEFT hemidiaphragm. Scattered loops of gas-filled bowel throughout the abdomen. Degree of distension slightly improved compared to previous imaging still with some mild to moderate distension of bowel loops in the LEFT mid abdomen. Post partial colonic resection as before. Stool and gas scattered throughout the colon with gas in the rectum. Degenerative changes throughout the spine. RIGHT hip arthroplasty. Osteopenia. IMPRESSION: 1. Enteric tube courses into the abdomen. Tip likely in the area of the proximal duodenum. 2. Scattered loops of gas-filled bowel throughout the abdomen. Degree of distension slightly improved compared to previous imaging still with some mild to moderate distension of bowel loops in the LEFT mid abdomen. 3. No free air. 4. Added density over the RIGHT upper chest, image slightly rotated and could project vascular structures over the upper chest. Density is however more than expected just above the RIGHT hilum and underlying nodule up to 2.3 cm is considered. Given nonstandard positioning currently would suggest CT of the chest for further evaluation on follow-up. Electronically Signed   By: Zetta Bills M.D.   On: 02/23/2022 08:12    Scheduled Meds:  albuterol       bisacodyl  10 mg Rectal Once   dextromethorphan-guaiFENesin  1 tablet Oral BID   [START ON 02/25/2022] enoxaparin (LOVENOX) injection  40 mg Subcutaneous Q24H   insulin aspart  0-9 Units Subcutaneous Q4H   mometasone-formoterol  2 puff Inhalation BID   NIFEdipine  90 mg Oral q morning   olopatadine  1 drop Both Eyes BID   potassium chloride  40 mEq Oral Q3H   pravastatin  20 mg Oral q morning   sodium bicarbonate  650 mg Oral BID   Continuous Infusions:  dextrose 5 % and 0.45% NaCl 10 mL/hr at 02/24/22 0904   famotidine (PEPCID) IV Stopped (02/23/22 1818)    LOS: 3 days   Roxan Hockey  M.D on 02/24/2022 at 1:58 PM  Go to [email or web address] - for contact info  [facility] - Office  [phone]  If 7PM-7AM, please contact night-coverage [email or web address] 02/24/2022, 1:58 PM

## 2022-02-24 NOTE — Progress Notes (Signed)
Pts oxygen has been ordered.  New O2 Sat note must be reported to [facility], [place] [phone].  Call once the note is in and the [place] with read and tell you what else she needs.  Elisah Parmer Tarpley-Carter, MSW, LCSW-A Pronouns:  She/Her/Hers [facility] HealthTransitions of Care Clinical Social Worker Direct Number:  [phone] [email or web address]

## 2022-02-24 NOTE — Progress Notes (Signed)
Patient c/o shortness of breath this am, room air sat 64%, vital signs B/P 118/82,hr 76. Alert and oriented x's 4.Dr Joesph Fillers notified,orders received and given. I reached out to Respiratory Therapy for support.Patient was place on 3 liters  of oxygen, patient oxygen saturation came up to 77%, increased oxygen to 5 liters, patient's oxygen saturation went up to 79%. Patient denies pain at this time. Breathing treatment given by Respiratory Therapist,and oxygen was increased to 8 liters. MD at bedside.Plan of care ongoing.

## 2022-02-24 NOTE — Progress Notes (Signed)
Patient being transferred to ICU-07 to be placed on BiPAP and closer observation. Report called and given to [place] . Patient's daughter at bedside. Transported by Barbaraann Rondo and [facility] LPN to awaiting floor. Plan of care ongoing..Transported with Oxygen at 8 liters via Nasal canula.

## 2022-02-25 ENCOUNTER — Inpatient Hospital Stay (HOSPITAL_COMMUNITY): Payer: MEDICARE

## 2022-02-25 DIAGNOSIS — J690 Pneumonitis due to inhalation of food and vomit: Secondary | ICD-10-CM | POA: Diagnosis present

## 2022-02-25 DIAGNOSIS — J9601 Acute respiratory failure with hypoxia: Secondary | ICD-10-CM

## 2022-02-25 LAB — CBC
HCT: 36.8 % (ref 36.0–46.0)
Hemoglobin: 11.7 g/dL — ABNORMAL LOW (ref 12.0–15.0)
MCH: 31.2 pg (ref 26.0–34.0)
MCHC: 31.8 g/dL (ref 30.0–36.0)
MCV: 98.1 fL (ref 80.0–100.0)
Platelets: 234 10*3/uL (ref 150–400)
RBC: 3.75 MIL/uL — ABNORMAL LOW (ref 3.87–5.11)
RDW: 13.7 % (ref 11.5–15.5)
WBC: 13.6 10*3/uL — ABNORMAL HIGH (ref 4.0–10.5)
nRBC: 0 % (ref 0.0–0.2)

## 2022-02-25 LAB — BASIC METABOLIC PANEL
Anion gap: 8 (ref 5–15)
BUN: 24 mg/dL — ABNORMAL HIGH (ref 8–23)
CO2: 21 mmol/L — ABNORMAL LOW (ref 22–32)
Calcium: 9.2 mg/dL (ref 8.9–10.3)
Chloride: 114 mmol/L — ABNORMAL HIGH (ref 98–111)
Creatinine, Ser: 0.84 mg/dL (ref 0.44–1.00)
GFR, Estimated: >60 mL/min (ref >60)
Glucose, Bld: 151 mg/dL — ABNORMAL HIGH (ref 70–99)
Potassium: 3.8 mmol/L (ref 3.5–5.1)
Sodium: 143 mmol/L (ref 135–145)

## 2022-02-25 LAB — MRSA NEXT GEN BY PCR, NASAL: MRSA by PCR Next Gen: NOT DETECTED

## 2022-02-25 LAB — GLUCOSE, CAPILLARY
Glucose-Capillary: 137 mg/dL — ABNORMAL HIGH (ref 70–99)
Glucose-Capillary: 141 mg/dL — ABNORMAL HIGH (ref 70–99)
Glucose-Capillary: 204 mg/dL — ABNORMAL HIGH (ref 70–99)
Glucose-Capillary: 273 mg/dL — ABNORMAL HIGH (ref 70–99)
Glucose-Capillary: 137 mg/dL — ABNORMAL HIGH (ref 70–99)

## 2022-02-25 MED ORDER — BISACODYL 10 MG RE SUPP
10.0000 mg | Freq: Every evening | RECTAL | Status: DC
  Administered 2022-02-25 – 2022-03-01 (×4): 10 mg via RECTAL
  Filled 2022-02-25 (×5): qty 1

## 2022-02-25 NOTE — Progress Notes (Signed)
PROGRESS NOTE     Robin Mcclure, is a 88 y.o. female, DOB - 1922/05/22, JJK:[id number]  Admit date - 02/21/2022   Admitting Physician Bernadette Hoit, DO  Outpatient Primary MD for the patient is [name], [name], MD  LOS - 4  Chief Complaint  Patient presents with   Emesis        Brief Narrative:   88 y.o. female with medical history significant of COPD, GERD, hypertension, hyperlipidemia, T2DM admitted on 02/21/2022 with intractable emesis and found to have partial small bowel obstruction  Versus ileus   -Assessment and Plan:  1)PSBO Vs Ileus--- abdominal and chest x-ray from 02/23/22 suggested possible abnormalities of the chest x-ray with regards to vascular structures of the chest--radiologist recommended CT with contrast -CT abdomen and pelvis and CT chest with contrast on 02/23/2022 showed no significant vascular abnormalities of the chest---Shows resolving small bowel obstruction versus ileus--with contrast in the colon -Discussed with general surgeon Dr. Constance Haw... She reviewed the images -Conservative management advised -Keep NG tube clamped- possible aspiration episode on 02/24/2022--speech eval requested -Dulcolax suppository -IV antiemetics as needed -IV fentanyl as needed --Clinically and radiologically SBO appears to be resolving.  2)Hypomagnesemia/hypokalemia --- replace and recheck,    3)acute hypoxic respiratory failure due to aspiration pneumonia -in the early hours of 02/24/2022 patient developed significant respiratory distress with hypoxia O2 sats down to the 60s -Required deep suctioning, bronchodilators, high flow oxygen Arterial Blood Gas result:  pO2 44; pCO2 48; pH 7.3;  HCO3 23.6, %O2 Sat 76.8 on 8 L of [place] -Currently requiring up to 11 L of oxygen via nasal cannula C/n  Augmentin started on 02/24/22-- -Continue mucolytics, bronchodilators and supplemental oxygen -Get speech evaluation -N.p.o. except for ice chips and sips -Keep NG tube clamped --  Patient to remain in stepdown -continue BiPAP alternating with high flow oxygen -Repeat x-rays on 02/25/2022 suggest bilateral infiltrates/aspiration  4)HTN- Hold nifedipine  may use IV Hydralazine 10 mg  Every 4 hours Prn for systolic blood pressure over 160 mmhg  5)HyperNatremia/Hyperchloremia with non-anion gap metabolic Acidosis--- due to free water deficit/dehydration in the setting of small bowel obstruction on n.p.o. status -Patient received IV fluids and bicarb infusion prior to repeat contrast study on 02/23/2022 since patient already had a contrast study on 37/05/6965 -Metabolic acidosis improving -Repeat BMP in a.m.  6)DM2-A1c 6.4 reflecting excellent diabetic control PTA -Hold Januvia and metformin Use Novolog/Humalog Sliding scale insulin with Accu-Cheks/Fingersticks as ordered   7)Social/ethics--- plan of care and advanced directive discussed with patient and patient's daughter [name] -Patient is a full code -Okay for BiPAP  Prophylaxis:- -Lovenox for DVT prophylaxis and Pepcid for GI prophylaxis  Disposition/Need for in-Hospital Stay- patient unable to be discharged at this time due to - -acute hypoxic respiratory failure with concern for possible aspiration requiring further investigations and interventions  Status is: Inpatient   Disposition: The patient is from: Home              Anticipated d/c is to: Home              Anticipated d/c date is: 2 days              Patient currently is not medically stable to d/c. Barriers: Not Clinically Stable-   Code Status :  -  Code Status: Full Code   Family Communication:   Discussed with patient's daughter [name]  DVT Prophylaxis  :   - SCDs   enoxaparin (LOVENOX) injection 40 mg Start:  02/25/22 1000 SCDs Start: 02/21/22 2316   Lab Results  Component Value Date   PLT 234 02/25/2022   Inpatient Medications  Scheduled Meds:  amoxicillin-clavulanate  875 mg of amoxicillin Per Tube Q12H    Chlorhexidine Gluconate Cloth  6 each Topical Daily   dextromethorphan-guaiFENesin  1 tablet Oral BID   enoxaparin (LOVENOX) injection  40 mg Subcutaneous Q24H   insulin aspart  0-9 Units Subcutaneous Q4H   mometasone-formoterol  2 puff Inhalation BID   NIFEdipine  90 mg Oral q morning   olopatadine  1 drop Both Eyes BID   mouth rinse  15 mL Mouth Rinse 4 times per day   pravastatin  20 mg Oral q morning   sodium bicarbonate  650 mg Oral BID   Continuous Infusions:  dextrose 5 % and 0.45% NaCl 10 mL/hr at 02/25/22 1600   famotidine (PEPCID) IV Stopped (02/24/22 1633)   PRN Meds:.acetaminophen, acetaminophen, albuterol, albuterol, fentaNYL (SUBLIMAZE) injection, hydrALAZINE, ondansetron (ZOFRAN) IV, mouth rinse   Anti-infectives (From admission, onward)    Start     Dose/Rate Route Frequency Ordered Stop   02/24/22 1530  amoxicillin-clavulanate (AUGMENTIN) 200-28.5 MG/5ML suspension 876 mg        875 mg of amoxicillin Per Tube Every 12 hours 02/24/22 1430     02/24/22 1515  amoxicillin-clavulanate (AUGMENTIN) 400-57 MG/5ML suspension 875 mg  Status:  Discontinued        875 mg Per Tube Every 12 hours 02/24/22 1418 02/24/22 1430        Subjective: Robin Mcclure today has no fevers, no further emesis,  No chest pain,   - -Able to come off BiPAP during the day -Requiring up to 11 L of oxygen via nasal cannula -Patient's daughter [name] visited -Respiratory status remains tenuous -Cough, dyspnea and audible lung sounds persist  Objective: Vitals:   02/25/22 1400 02/25/22 1500 02/25/22 1600 02/25/22 1612  BP: (!) 120/51 (!) 113/48 (!) 121/52   Pulse: 80 83 89 78  Resp: 16 19 (!) 25 (!) 24  Temp:    99.4 F (37.4 C)  TempSrc:    Oral  SpO2: 94% 94% 94% 94%  Weight:      Height:        Intake/Output Summary (Last 24 hours) at 02/25/2022 1733 Last data filed at 02/25/2022 1600 Gross per 24 hour  Intake 447.27 ml  Output 0 ml  Net 447.27 ml   Filed Weights   02/21/22  2145 02/24/22 1413 02/25/22 0522  Weight: 55.6 kg 57.4 kg 58.8 kg   Physical Exam  Gen:- Awake Alert, tachypnea and respiratory distress HEENT:- [place].AT, No sclera icterus Ears- HOH Nose- NG tube to be clamped/[place] 11L/min/Bipap Neck-Supple Neck,No JVD,.  Lungs--air movement improving, scattered rhonchi and wheezes -CV- S1, S2 normal, regular  Abd-  +ve B.Sounds, Abd Soft, No significant tenderness,    Extremity/Skin:-Trace edema, pedal pulses present  Psych-affect is appropriate, oriented x3 Neuro-generalized weakness, no new focal deficits, no tremors  Data Reviewed: I have personally reviewed following labs and imaging studies  CBC: Recent Labs  Lab 02/21/22 1534 02/22/22 0029 02/23/22 0422 02/25/22 0502  WBC 5.3 3.9* 5.9 13.6*  NEUTROABS 3.5  --   --   --   HGB 12.4 11.9* 11.1* 11.7*  HCT 37.5 35.9* 35.1* 36.8  MCV 94.7 95.7 98.9 98.1  PLT 247 234 228 631   Basic Metabolic Panel: Recent Labs  Lab 02/21/22 1534 02/22/22 0029 02/23/22 0422 02/24/22 0810 02/25/22 0502  NA 138  139 146* 139 143  K 3.9 3.7 3.5 3.0* 3.8  CL 107 111 117* 109 114*  CO2 19* 20* 18* 20* 21*  GLUCOSE 221* 186* 114* 198* 151*  BUN 28* 29* 28* 19 24*  CREATININE 0.97 0.97 0.86 0.69 0.84  CALCIUM 9.6 9.0 9.2 8.9 9.2  MG  --  1.5*  --   --   --   PHOS  --  2.9  --   --   --    GFR: Estimated Creatinine Clearance: 34.4 mL/min (by C-G formula based on SCr of 0.84 mg/dL). Liver Function Tests: Recent Labs  Lab 02/21/22 1534 02/22/22 0029  AST 12* 11*  ALT 12 12  ALKPHOS 66 58  BILITOT 1.3* 1.1  PROT 6.6 6.1*  ALBUMIN 3.2* 3.0*   HbA1C: No results for input(s): "HGBA1C" in the last 72 hours.  Radiology Studies: DG ABD ACUTE 2+V W 1V CHEST  Result Date: 02/25/2022 CLINICAL DATA:  Respiratory failure with hypoxia. Bowel obstruction. EXAM: DG ABDOMEN ACUTE WITH 1 VIEW CHEST COMPARISON:  02/24/2022 FINDINGS: Orogastric tube is seen with tip in the distal antrum or duodenal bulb. Oral  contrast material is now seen within nondilated left colon. Decreased small bowel dilatation is seen since prior exam. Contrast noted in the urinary bladder from recent CT. Cardiomegaly is stable. Enlarged pulmonary arteries are consistent with pulmonary arterial hypertension. Increased opacity in both lung bases may be due to atelectasis or infiltrate. No evidence of pleural effusion or pneumothorax. IMPRESSION: Decreased small bowel dilatation since prior exam. Oral contrast material now seen in nondilated left colon. Increased bibasilar atelectasis versus infiltrate. Stable cardiomegaly and pulmonary arterial hypertension. Electronically Signed   By: Marlaine Hind M.D.   On: 02/25/2022 09:16   DG ABD ACUTE 2+V W 1V CHEST  Result Date: 02/24/2022 CLINICAL DATA:  88 year old female with resolving small bowel obstruction on CT yesterday, oral contrast reached the colon. EXAM: DG ABDOMEN ACUTE WITH 1 VIEW CHEST COMPARISON:  CT Chest, Abdomen, and Pelvis yesterday and earlier. FINDINGS: Portable upright AP view of the chest at 0719 hours. Enteric tube terminates in the distal stomach or duodenum, side hole at the level of the gastric body. Stable cholecystectomy clips. No pneumoperitoneum or pneumothorax. Stable lung bases and mediastinum. Portable upright and supine views the abdomen at 0719 hours. Retained oral contrast appears to be largely within the colon now from the transverse to the sigmoid. Excreted IV contrast in the urinary bladder. Superimposed gas-filled small bowel loops up to 3.5 cm diameter are not significantly changed from the CT yesterday. Right hip arthroplasty.  No acute osseous abnormality identified. IMPRESSION: 1. Stable bowel gas pattern from the CT yesterday. Retained oral contrast now primarily in the colon. No free air. 2. Stable chest. Enteric tube terminates in the distal stomach or duodenum. Electronically Signed   By: Genevie Ann M.D.   On: 02/24/2022 09:21    Scheduled Meds:   amoxicillin-clavulanate  875 mg of amoxicillin Per Tube Q12H   Chlorhexidine Gluconate Cloth  6 each Topical Daily   dextromethorphan-guaiFENesin  1 tablet Oral BID   enoxaparin (LOVENOX) injection  40 mg Subcutaneous Q24H   insulin aspart  0-9 Units Subcutaneous Q4H   mometasone-formoterol  2 puff Inhalation BID   NIFEdipine  90 mg Oral q morning   olopatadine  1 drop Both Eyes BID   mouth rinse  15 mL Mouth Rinse 4 times per day   pravastatin  20 mg Oral q morning  sodium bicarbonate  650 mg Oral BID   Continuous Infusions:  dextrose 5 % and 0.45% NaCl 10 mL/hr at 02/25/22 1600   famotidine (PEPCID) IV Stopped (02/24/22 1633)    LOS: 4 days   Roxan Hockey M.D on 02/25/2022 at 5:33 PM  Go to [email or web address] - for contact info  [facility] - Office  [phone]  If 7PM-7AM, please contact night-coverage [email or web address] 02/25/2022, 5:33 PM

## 2022-02-25 NOTE — Progress Notes (Signed)
Sent Dr. Denton Brick secure chat and let MD know that some of pateint's meds cant be crushed and that daughter states patient usually takes them whole with apple sauce at home. MD gave order that it's okay for patient to take meds with apple sauce and sips.

## 2022-02-26 ENCOUNTER — Inpatient Hospital Stay (HOSPITAL_COMMUNITY): Payer: MEDICARE

## 2022-02-26 DIAGNOSIS — J9601 Acute respiratory failure with hypoxia: Secondary | ICD-10-CM | POA: Diagnosis not present

## 2022-02-26 LAB — GLUCOSE, CAPILLARY
Glucose-Capillary: 131 mg/dL — ABNORMAL HIGH (ref 70–99)
Glucose-Capillary: 144 mg/dL — ABNORMAL HIGH (ref 70–99)
Glucose-Capillary: 156 mg/dL — ABNORMAL HIGH (ref 70–99)
Glucose-Capillary: 203 mg/dL — ABNORMAL HIGH (ref 70–99)
Glucose-Capillary: 224 mg/dL — ABNORMAL HIGH (ref 70–99)
Glucose-Capillary: 231 mg/dL — ABNORMAL HIGH (ref 70–99)
Glucose-Capillary: 259 mg/dL — ABNORMAL HIGH (ref 70–99)

## 2022-02-26 LAB — BASIC METABOLIC PANEL
Anion gap: 10 (ref 5–15)
BUN: 26 mg/dL — ABNORMAL HIGH (ref 8–23)
CO2: 20 mmol/L — ABNORMAL LOW (ref 22–32)
Calcium: 9.3 mg/dL (ref 8.9–10.3)
Chloride: 114 mmol/L — ABNORMAL HIGH (ref 98–111)
Creatinine, Ser: 0.83 mg/dL (ref 0.44–1.00)
GFR, Estimated: >60 mL/min (ref >60)
Glucose, Bld: 126 mg/dL — ABNORMAL HIGH (ref 70–99)
Potassium: 3.5 mmol/L (ref 3.5–5.1)
Sodium: 144 mmol/L (ref 135–145)

## 2022-02-26 LAB — CBC
HCT: 35.8 % — ABNORMAL LOW (ref 36.0–46.0)
Hemoglobin: 11.6 g/dL — ABNORMAL LOW (ref 12.0–15.0)
MCH: 31.3 pg (ref 26.0–34.0)
MCHC: 32.4 g/dL (ref 30.0–36.0)
MCV: 96.5 fL (ref 80.0–100.0)
Platelets: 253 10*3/uL (ref 150–400)
RBC: 3.71 MIL/uL — ABNORMAL LOW (ref 3.87–5.11)
RDW: 13.7 % (ref 11.5–15.5)
WBC: 11.9 10*3/uL — ABNORMAL HIGH (ref 4.0–10.5)
nRBC: 0 % (ref 0.0–0.2)

## 2022-02-26 MED ORDER — AMOXICILLIN-POT CLAVULANATE 200-28.5 MG/5ML PO SUSR
500.0000 mg | Freq: Three times a day (TID) | ORAL | Status: DC
  Administered 2022-02-26 (×2): 500 mg
  Filled 2022-02-26 (×7): qty 15

## 2022-02-26 MED ORDER — BISACODYL 10 MG RE SUPP
10.0000 mg | Freq: Once | RECTAL | Status: AC
  Administered 2022-02-26: 10 mg via RECTAL
  Filled 2022-02-26: qty 1

## 2022-02-26 MED ORDER — POTASSIUM CHLORIDE CRYS ER 20 MEQ PO TBCR: 40.0000 meq | EXTENDED_RELEASE_TABLET | Freq: Once | ORAL | Status: DC

## 2022-02-26 MED ORDER — AMOXICILLIN-POT CLAVULANATE 500-125 MG PO TABS
1.0000 | ORAL_TABLET | Freq: Three times a day (TID) | ORAL | Status: DC
  Administered 2022-02-26 – 2022-03-02 (×13): 1 via ORAL
  Filled 2022-02-26 (×19): qty 1

## 2022-02-26 MED ORDER — POTASSIUM CHLORIDE 20 MEQ PO PACK
40.0000 meq | PACK | Freq: Once | ORAL | Status: AC
  Administered 2022-02-26: 40 meq
  Filled 2022-02-26: qty 2

## 2022-02-26 NOTE — Progress Notes (Signed)
PROGRESS NOTE     Robin Mcclure, is a 88 y.o. female, DOB - 21-Sep-1922, IOX:[id number]  Admit date - 02/21/2022   Admitting Physician Bernadette Hoit, DO  Outpatient Primary MD for the patient is Mcclure, [name], MD  LOS - 5  Chief Complaint  Patient presents with   Emesis        Brief Narrative:   88 y.o. female with medical history significant of COPD, GERD, hypertension, hyperlipidemia, T2DM admitted on 02/21/2022 with intractable emesis and found to have partial small bowel obstruction  Versus ileus   -Assessment and Plan:  1)PSBO Vs Ileus--- abdominal and chest x-ray from 02/23/22 suggested possible abnormalities of the chest x-ray with regards to vascular structures of the chest--radiologist recommended CT with contrast -CT abdomen and pelvis and CT chest with contrast on 02/23/2022 showed no significant vascular abnormalities of the chest---Shows resolving small bowel obstruction versus ileus--with contrast in the colon -Discussed with general surgeon Dr. Constance Haw... She reviewed the images -Conservative management advised possible aspiration episode on 02/24/2022--speech eval requested -Dulcolax suppository -IV antiemetics as needed -IV fentanyl as needed --Clinically and radiologically SBO appears to be resolved -Patient had BM -NG tube discontinued on 02/26/2022  2)Hypomagnesemia/hypokalemia --- replace and recheck,    3)acute hypoxic respiratory failure due to aspiration pneumonia -in the early hours of 02/24/2022 patient developed significant respiratory distress with hypoxia O2 sats down to the 60s -Required deep suctioning, bronchodilators, high flow oxygen Arterial Blood Gas result:  pO2 44; pCO2 48; pH 7.3;  HCO3 23.6, %O2 Sat 76.8 on 8 L of [place] -Currently requiring up to 11 L of oxygen via nasal cannula C/n  Augmentin started on 02/24/22-- -Continue mucolytics, bronchodilators and supplemental oxygen - speech evaluation appreciated, dysphagia 1/pured diet  recommended with thin liquids -NG tube removed -- Patient to remain in stepdown -continue BiPAP alternating with high flow oxygen -Repeat x-rays on 02/26/2022 suggest bilateral infiltrates/aspiration  4)HTN- Hold nifedipine  may use IV Hydralazine 10 mg  Every 4 hours Prn for systolic blood pressure over 160 mmhg  5)HyperNatremia/Hyperchloremia with non-anion gap metabolic Acidosis--- due to free water deficit/dehydration in the setting of small bowel obstruction on n.p.o. status -Patient received IV fluids and bicarb infusion prior to repeat contrast study on 02/23/2022 since patient already had a contrast study on 26/12/3417 -Metabolic acidosis improving -Repeat BMP in a.m.  6)DM2-A1c 6.4 reflecting excellent diabetic control PTA -Hold Januvia and metformin Use Novolog/Humalog Sliding scale insulin with Accu-Cheks/Fingersticks as ordered   7)Social/ethics--- plan of care and advanced directive discussed with patient and patient's daughter Robin Mcclure -Patient is a full code -Okay for BiPAP  Prophylaxis:- -Lovenox for DVT prophylaxis and Pepcid for GI prophylaxis  Disposition/Need for in-Hospital Stay- patient unable to be discharged at this time due to - -acute hypoxic respiratory failure with concern for possible aspiration requiring further investigations and interventions  Status is: Inpatient   Disposition: The patient is from: Home              Anticipated d/c is to: Home              Anticipated d/c date is: > 3 days              Patient currently is not medically stable to d/c. Barriers: Not Clinically Stable-   Code Status :  -  Code Status: Full Code   Family Communication:   Discussed with patient's daughter Robin Mcclure  DVT Prophylaxis  :   - SCDs   enoxaparin (  LOVENOX) injection 40 mg Start: 02/25/22 1000 SCDs Start: 02/21/22 2316   Lab Results  Component Value Date   PLT 253 02/26/2022   Inpatient Medications  Scheduled Meds:   amoxicillin-clavulanate  500 mg of amoxicillin Per Tube TID   bisacodyl  10 mg Rectal QPM   Chlorhexidine Gluconate Cloth  6 each Topical Daily   dextromethorphan-guaiFENesin  1 tablet Oral BID   enoxaparin (LOVENOX) injection  40 mg Subcutaneous Q24H   insulin aspart  0-9 Units Subcutaneous Q4H   mometasone-formoterol  2 puff Inhalation BID   NIFEdipine  90 mg Oral q morning   olopatadine  1 drop Both Eyes BID   mouth rinse  15 mL Mouth Rinse 4 times per day   pravastatin  20 mg Oral q morning   Continuous Infusions:  dextrose 5 % and 0.45% NaCl 40 mL/hr at 02/26/22 0829   famotidine (PEPCID) IV 20 mg (02/26/22 1831)   PRN Meds:.acetaminophen, acetaminophen, albuterol, albuterol, fentaNYL (SUBLIMAZE) injection, hydrALAZINE, ondansetron (ZOFRAN) IV, mouth rinse   Anti-infectives (From admission, onward)    Start     Dose/Rate Route Frequency Ordered Stop   02/26/22 1000  amoxicillin-clavulanate (AUGMENTIN) 200-28.5 MG/5ML suspension 500 mg        500 mg of amoxicillin Per Tube 3 times daily 02/26/22 0815     02/24/22 1530  amoxicillin-clavulanate (AUGMENTIN) 200-28.5 MG/5ML suspension 876 mg  Status:  Discontinued        875 mg of amoxicillin Per Tube Every 12 hours 02/24/22 1430 02/26/22 0815   02/24/22 1515  amoxicillin-clavulanate (AUGMENTIN) 400-57 MG/5ML suspension 875 mg  Status:  Discontinued        875 mg Per Tube Every 12 hours 02/24/22 1418 02/24/22 1430       Subjective: Robin Mcclure today has no fevers, no further emesis,  No chest pain,   - -Cough and respiratory concerns persist -Requiring up to 11 L of oxygen via nasal cannula -Patient's daughter Robin Mcclure is at bedside    Objective: Vitals:   02/26/22 1516 02/26/22 1600 02/26/22 1700 02/26/22 1800  BP:  (!) 146/65 (!) 143/75 131/62  Pulse: 88 95 98 (!) 114  Resp: 20 (!) 27 (!) 29 (!) 25  Temp:      TempSrc:      SpO2: 94% (!) 89% 90% 97%  Weight:      Height:        Intake/Output Summary (Last 24  hours) at 02/26/2022 1905 Last data filed at 02/26/2022 1800 Gross per 24 hour  Intake 1044.91 ml  Output 150 ml  Net 894.91 ml   Filed Weights   02/24/22 1413 02/25/22 0522 02/26/22 0500  Weight: 57.4 kg 58.8 kg 58.8 kg   Physical Exam  Gen:- Awake Alert, tachypnea and respiratory distress HEENT:- [place].AT, No sclera icterus Ears- HOH Nose- NG tube to be clamped/[place] 11L/min/Bipap Neck-Supple Neck,No JVD,.  Lungs--air movement improving, scattered rhonchi and wheezes -CV- S1, S2 normal, regular  Abd-  +ve B.Sounds, Abd Soft, No significant tenderness,    Extremity/Skin:-Trace edema, pedal pulses present  Psych-affect is appropriate, oriented x3 Neuro-generalized weakness, no new focal deficits, no tremors  Data Reviewed: I have personally reviewed following labs and imaging studies  CBC: Recent Labs  Lab 02/21/22 1534 02/22/22 0029 02/23/22 0422 02/25/22 0502 02/26/22 0351  WBC 5.3 3.9* 5.9 13.6* 11.9*  NEUTROABS 3.5  --   --   --   --   HGB 12.4 11.9* 11.1* 11.7* 11.6*  HCT 37.5 35.9*  35.1* 36.8 35.8*  MCV 94.7 95.7 98.9 98.1 96.5  PLT 247 234 228 234 629   Basic Metabolic Panel: Recent Labs  Lab 02/22/22 0029 02/23/22 0422 02/24/22 0810 02/25/22 0502 02/26/22 0351  NA 139 146* 139 143 144  K 3.7 3.5 3.0* 3.8 3.5  CL 111 117* 109 114* 114*  CO2 20* 18* 20* 21* 20*  GLUCOSE 186* 114* 198* 151* 126*  BUN 29* 28* 19 24* 26*  CREATININE 0.97 0.86 0.69 0.84 0.83  CALCIUM 9.0 9.2 8.9 9.2 9.3  MG 1.5*  --   --   --   --   PHOS 2.9  --   --   --   --    GFR: Estimated Creatinine Clearance: 34.9 mL/min (by C-G formula based on SCr of 0.83 mg/dL). Liver Function Tests: Recent Labs  Lab 02/21/22 1534 02/22/22 0029  AST 12* 11*  ALT 12 12  ALKPHOS 66 58  BILITOT 1.3* 1.1  PROT 6.6 6.1*  ALBUMIN 3.2* 3.0*   HbA1C: No results for input(s): "HGBA1C" in the last 72 hours.  Radiology Studies: DG ABD ACUTE 2+V W 1V CHEST  Result Date: 02/26/2022 CLINICAL  DATA:  Aspiration.  History of small-bowel obstruction. EXAM: DG ABDOMEN ACUTE WITH 1 VIEW CHEST COMPARISON:  Abdominal radiograph 02/25/2022 FINDINGS: Enteric tube appears coiled within the stomach. Stable cardiomegaly and hilar prominence. Similar-appearing left-greater-than-right basilar airspace opacities which may represent atelectasis. Possible trace bilateral pleural effusions. No pneumothorax. Thoracic spine degenerative changes. Oral contrast material within the descending colon. There are a few gaseous distended loops of small bowel within the central abdomen. Surgical clips right upper quadrant. Right hip arthroplasty. Lumbar spine degenerative changes. IMPRESSION: 1. A few gaseous distended loops of small bowel within the central abdomen, similar to prior. 2. Bibasilar atelectasis. 3. Oral contrast material within the descending colon. 4. Enteric tube appears coiled within the stomach. Electronically Signed   By: Lovey Newcomer M.D.   On: 02/26/2022 08:08   DG ABD ACUTE 2+V W 1V CHEST  Result Date: 02/25/2022 CLINICAL DATA:  Respiratory failure with hypoxia. Bowel obstruction. EXAM: DG ABDOMEN ACUTE WITH 1 VIEW CHEST COMPARISON:  02/24/2022 FINDINGS: Orogastric tube is seen with tip in the distal antrum or duodenal bulb. Oral contrast material is now seen within nondilated left colon. Decreased small bowel dilatation is seen since prior exam. Contrast noted in the urinary bladder from recent CT. Cardiomegaly is stable. Enlarged pulmonary arteries are consistent with pulmonary arterial hypertension. Increased opacity in both lung bases may be due to atelectasis or infiltrate. No evidence of pleural effusion or pneumothorax. IMPRESSION: Decreased small bowel dilatation since prior exam. Oral contrast material now seen in nondilated left colon. Increased bibasilar atelectasis versus infiltrate. Stable cardiomegaly and pulmonary arterial hypertension. Electronically Signed   By: Marlaine Hind M.D.   On:  02/25/2022 09:16    Scheduled Meds:  amoxicillin-clavulanate  500 mg of amoxicillin Per Tube TID   bisacodyl  10 mg Rectal QPM   Chlorhexidine Gluconate Cloth  6 each Topical Daily   dextromethorphan-guaiFENesin  1 tablet Oral BID   enoxaparin (LOVENOX) injection  40 mg Subcutaneous Q24H   insulin aspart  0-9 Units Subcutaneous Q4H   mometasone-formoterol  2 puff Inhalation BID   NIFEdipine  90 mg Oral q morning   olopatadine  1 drop Both Eyes BID   mouth rinse  15 mL Mouth Rinse 4 times per day   pravastatin  20 mg Oral q morning  Continuous Infusions:  dextrose 5 % and 0.45% NaCl 40 mL/hr at 02/26/22 0829   famotidine (PEPCID) IV 20 mg (02/26/22 1831)    LOS: 5 days   Roxan Hockey M.D on 02/26/2022 at 7:05 PM  Go to [email or web address] - for contact info  [facility] - Office  [phone]  If 7PM-7AM, please contact night-coverage [email or web address] 02/26/2022, 7:05 PM

## 2022-02-26 NOTE — Evaluation (Signed)
Clinical/Bedside Swallow Evaluation Patient Details  Name: Robin Mcclure MRN: [id number] Date of Birth: [birth year]  Today's Date: 02/26/2022 Time: SLP Start Time (ACUTE ONLY): 1135 SLP Stop Time (ACUTE ONLY): 1200 SLP Time Calculation (min) (ACUTE ONLY): 25 min  Past Medical History:  Past Medical History:  Diagnosis Date   Diabetes mellitus without complication ([place])    Hyperlipidemia    Hypertension    Past Surgical History:  Past Surgical History:  Procedure Laterality Date   ABDOMINAL HYSTERECTOMY     BIOPSY  01/19/2018   Procedure: BIOPSY;  Surgeon: Ronnette Juniper, MD;  Location: [facility] ENDOSCOPY;  Service: Gastroenterology;;   CHOLECYSTECTOMY     ESOPHAGOGASTRODUODENOSCOPY (EGD) WITH PROPOFOL N/A 01/19/2018   Procedure: ESOPHAGOGASTRODUODENOSCOPY (EGD) WITH PROPOFOL;  Surgeon: Ronnette Juniper, MD;  Location: [place];  Service: Gastroenterology;  Laterality: N/A;   HIP SURGERY     HPI:  88 y.o. female with medical history significant of COPD, GERD, hypertension, hyperlipidemia, T2DM admitted on 02/21/2022 with intractable emesis and found to have partial small bowel obstruction  Versus ileus. in the early hours of 02/24/2022 patient developed significant respiratory distress with hypoxia O2 sats down to the 60s  -Required deep suctioning, bronchodilators, high flow oxygen  Arterial Blood Gas result:  pO2 44; pCO2 48; pH 7.3;  HCO3 23.6, %O2 Sat 76.8 on 8 L of [place]  -Currently requiring up to 11 L of oxygen via nasal cannula. BSE requested.    Assessment / Plan / Recommendation  Clinical Impression  Clinical swallow evaluation completed at bedside with Pt's daughter present. Pt has had NG in place since admission due to partial SBO and then use for medications. Pt's daughter indicates that Pt typically consumes a regular diet with thin liquids at home PTA. On Saturday morning, Pt developed shortness of breath after breakfast meal with NG in place. Oral motor examination reveals mild white coated  tongue, otherwise WNL. Pt with strong, congested cough. Shd had difficulty taking cup sips due to NG in place, so presnted with straw. Pt with occasional delayed throat clearing/cough otherwise no overt signs/symptoms of aspiration. SLP received permission from MD to have nursing pull NG, as it was felt that Pt could safely take PO medications whole or crushed in puree and might improve swallow function and decrease Pt discomfort. SLP then gave Pt additional PO following removal NG and Pt with improved voicing and subjective report of swallow comfort. Recommend D1/puree and thin liquids via SMALL sips and Pt to swallow 2x for each bite/sip, OK for PO medications whole or crushed as able in puree. SLP to f/u during acute stay. Above to RN, family, and MD. SLP Visit Diagnosis: Dysphagia, unspecified (R13.10)    Aspiration Risk  Mild aspiration risk    Diet Recommendation Dysphagia 1 (Puree);Thin liquid   Liquid Administration via: Cup;Straw Medication Administration: Whole meds with puree Supervision: Staff to assist with self feeding;Full supervision/cueing for compensatory strategies Compensations: Slow rate;Small sips/bites;Multiple dry swallows after each bite/sip Postural Changes: Seated upright at 90 degrees;Remain upright for at least 30 minutes after po intake    Other  Recommendations Oral Care Recommendations: Oral care BID;Staff/trained caregiver to provide oral care Other Recommendations: Clarify dietary restrictions    Recommendations for follow up therapy are one component of a multi-disciplinary discharge planning process, led by the attending physician.  Recommendations may be updated based on patient status, additional functional criteria and insurance authorization.  Follow up Recommendations Skilled nursing-short term rehab (<3 hours/day)      Assistance  Recommended at Discharge Intermittent Supervision/Assistance  Functional Status Assessment Patient has had a recent decline  in their functional status and demonstrates the ability to make significant improvements in function in a reasonable and predictable amount of time.  Frequency and Duration min 2x/week  1 week       Prognosis Prognosis for Safe Diet Advancement: Good      Swallow Study   General Date of Onset: 02/21/22 HPI: 88 y.o. female with medical history significant of COPD, GERD, hypertension, hyperlipidemia, T2DM admitted on 02/21/2022 with intractable emesis and found to have partial small bowel obstruction  Versus ileus. in the early hours of 02/24/2022 patient developed significant respiratory distress with hypoxia O2 sats down to the 60s  -Required deep suctioning, bronchodilators, high flow oxygen  Arterial Blood Gas result:  pO2 44; pCO2 48; pH 7.3;  HCO3 23.6, %O2 Sat 76.8 on 8 L of [place]  -Currently requiring up to 11 L of oxygen via nasal cannula. BSE requested. Type of Study: Bedside Swallow Evaluation Previous Swallow Assessment: n/a Diet Prior to this Study:  (clear liquids) Temperature Spikes Noted: No Respiratory Status: Nasal cannula History of Recent Intubation: No Behavior/Cognition: Alert;Cooperative;Pleasant mood Oral Cavity Assessment:  (coated tongue) Oral Care Completed by SLP: Yes Oral Cavity - Dentition: Dentures, top (partial bottom, 6 natural teeth) Vision: Functional for self-feeding Self-Feeding Abilities: Able to feed self;Needs set up Patient Positioning: Upright in bed Baseline Vocal Quality: Normal Volitional Cough: Strong;Congested Volitional Swallow: Able to elicit    Oral/Motor/Sensory Function Overall Oral Motor/Sensory Function: Within functional limits   Ice Chips Ice chips: Within functional limits Presentation: Spoon   Thin Liquid Thin Liquid: Impaired Presentation: Cup;Straw;Spoon;Self Fed Oral Phase Impairments: Reduced labial seal (difficulty due to NG in place) Pharyngeal  Phase Impairments: Throat Clearing - Delayed    Nectar Thick Nectar Thick  Liquid: Within functional limits Presentation: Cup;Straw   Honey Thick Honey Thick Liquid: Not tested   Puree Puree: Within functional limits Presentation: Spoon   Solid     Solid: Not tested Presentation:  (will assess solids next visit)     Thank you,  Genene Churn, [place]  Austin Pongratz 02/26/2022,12:20 PM

## 2022-02-27 ENCOUNTER — Inpatient Hospital Stay (HOSPITAL_COMMUNITY): Payer: MEDICARE

## 2022-02-27 DIAGNOSIS — J9601 Acute respiratory failure with hypoxia: Secondary | ICD-10-CM | POA: Diagnosis not present

## 2022-02-27 LAB — RENAL FUNCTION PANEL
Albumin: 2.6 g/dL — ABNORMAL LOW (ref 3.5–5.0)
Anion gap: 9 (ref 5–15)
BUN: 30 mg/dL — ABNORMAL HIGH (ref 8–23)
CO2: 19 mmol/L — ABNORMAL LOW (ref 22–32)
Calcium: 9.2 mg/dL (ref 8.9–10.3)
Chloride: 114 mmol/L — ABNORMAL HIGH (ref 98–111)
Creatinine, Ser: 0.83 mg/dL (ref 0.44–1.00)
GFR, Estimated: >60 mL/min (ref >60)
Glucose, Bld: 205 mg/dL — ABNORMAL HIGH (ref 70–99)
Phosphorus: 1.3 mg/dL — ABNORMAL LOW (ref 2.5–4.6)
Potassium: 4 mmol/L (ref 3.5–5.1)
Sodium: 142 mmol/L (ref 135–145)

## 2022-02-27 LAB — CBC
HCT: 32.9 % — ABNORMAL LOW (ref 36.0–46.0)
Hemoglobin: 10.9 g/dL — ABNORMAL LOW (ref 12.0–15.0)
MCH: 31.6 pg (ref 26.0–34.0)
MCHC: 33.1 g/dL (ref 30.0–36.0)
MCV: 95.4 fL (ref 80.0–100.0)
Platelets: 238 10*3/uL (ref 150–400)
RBC: 3.45 MIL/uL — ABNORMAL LOW (ref 3.87–5.11)
RDW: 13.9 % (ref 11.5–15.5)
WBC: 12.8 10*3/uL — ABNORMAL HIGH (ref 4.0–10.5)
nRBC: 0 % (ref 0.0–0.2)

## 2022-02-27 LAB — GLUCOSE, CAPILLARY
Glucose-Capillary: 208 mg/dL — ABNORMAL HIGH (ref 70–99)
Glucose-Capillary: 156 mg/dL — ABNORMAL HIGH (ref 70–99)
Glucose-Capillary: 242 mg/dL — ABNORMAL HIGH (ref 70–99)
Glucose-Capillary: 274 mg/dL — ABNORMAL HIGH (ref 70–99)
Glucose-Capillary: 244 mg/dL — ABNORMAL HIGH (ref 70–99)

## 2022-02-27 LAB — MAGNESIUM: Magnesium: 1.7 mg/dL (ref 1.7–2.4)

## 2022-02-27 MED ORDER — INSULIN ASPART 100 UNIT/ML IJ SOLN
0.0000 [IU] | Freq: Three times a day (TID) | INTRAMUSCULAR | Status: DC
  Administered 2022-02-28 (×2): 2 [IU] via SUBCUTANEOUS
  Administered 2022-02-28: 5 [IU] via SUBCUTANEOUS
  Administered 2022-03-01: 2 [IU] via SUBCUTANEOUS
  Administered 2022-03-01 – 2022-03-02 (×5): 3 [IU] via SUBCUTANEOUS
  Administered 2022-03-03 (×3): 2 [IU] via SUBCUTANEOUS
  Administered 2022-03-04: 1 [IU] via SUBCUTANEOUS
  Administered 2022-03-04: 2 [IU] via SUBCUTANEOUS
  Administered 2022-03-04: 3 [IU] via SUBCUTANEOUS
  Administered 2022-03-05: 2 [IU] via SUBCUTANEOUS
  Administered 2022-03-05: 1 [IU] via SUBCUTANEOUS

## 2022-02-27 MED ORDER — MAGNESIUM SULFATE 2 GM/50ML IV SOLN
2.0000 g | Freq: Once | INTRAVENOUS | Status: AC
  Administered 2022-02-27: 2 g via INTRAVENOUS
  Filled 2022-02-27: qty 50

## 2022-02-27 MED ORDER — SODIUM BICARBONATE 650 MG PO TABS
650.0000 mg | ORAL_TABLET | Freq: Three times a day (TID) | ORAL | Status: AC
  Administered 2022-02-27 – 2022-03-01 (×9): 650 mg via ORAL
  Filled 2022-02-27 (×9): qty 1

## 2022-02-27 MED ORDER — POTASSIUM PHOSPHATES 15 MMOLE/5ML IV SOLN
30.0000 mmol | Freq: Once | INTRAVENOUS | Status: AC
  Administered 2022-02-27: 30 mmol via INTRAVENOUS
  Filled 2022-02-27: qty 10

## 2022-02-27 MED ORDER — INSULIN ASPART 100 UNIT/ML IJ SOLN
0.0000 [IU] | Freq: Every day | INTRAMUSCULAR | Status: DC
  Administered 2022-03-01 – 2022-03-03 (×2): 2 [IU] via SUBCUTANEOUS

## 2022-02-27 NOTE — Progress Notes (Signed)
Modified Barium Swallow Progress Note  Patient Details  Name: Glendine Swetz MRN: [id number] Date of Birth: 11-13-1922  Today's Date: 02/27/2022  Modified Barium Swallow completed.  Full report located under Chart Review in the Imaging Section.  Brief recommendations include the following:  Clinical Impression  Pt presents with mi/mod oropharyngeal and suspected primary esophageal dysphagia characterized by weak lingual manipulation and prolonged oral transit, swallow trigger at the level of the valleculae, decreased tongue base retraction and epiglottic deflection which appeared to be negatively impacted by elongated uvulae that nearly meat the tip of the epiglottis and impaired epiglottic deflection and approximation with posterior pharyngeal wall resulting in reduced laryngeal vestibule closure and penetration of thins during the swallow and penetration/aspiration to the vocal folds (difficult to see if below vocal folds at times) in trace amounts and resulting vallecular residue and occasional contrast behind the uvulae. Epiglottis deflected greater with puree. Nectars only spilled over into the laryngeal vestibule in trace amounts, but also resulted in vallecular residue. Esophageal sweep revealed extensive barium contrast diffusely throughout the esophagus that resulted in some backflow/retrograde movement to the cervical esophagus. Pt still currently requires high supplemental oxygen and fatigues quickly, which increases risk for aspiration and decreases her ability to meet nutritional needs. Pt's daughter indicates that Pt was on nectar thickened liquids about 1-2 years ago and "did not like it". SLP reviewed imaging with Pt's daughter and suggested that we change her to NTL for now, with plan to advance her once she is clinically stronger. Daughter is willing to try this. Question whether Pt's appearance of elongated uvula could be due to some swelling from recent NG placement? Recommend D1/puree  and NTL via cup/straw with pacing strategies due to shortness of breath and fatigue and ensure that Pt is sitting upright for all eating/drinking and remains up for at least 30 minutes after meals due to esophageal dsymotility. SLP will follow during acute stay.   Swallow Evaluation Recommendations   Recommended Consults: Consider esophageal assessment   SLP Diet Recommendations: Dysphagia 1 (Puree) solids;Nectar thick liquid   Liquid Administration via: Cup;Straw   Medication Administration: Whole meds with puree   Supervision: Staff to assist with self feeding;Full supervision/cueing for compensatory strategies   Compensations: Slow rate;Small sips/bites;Multiple dry swallows after each bite/sip   Postural Changes: Remain semi-upright after after feeds/meals (Comment);Seated upright at 90 degrees   Oral Care Recommendations: Oral care BID;Staff/trained caregiver to provide oral care   Other Recommendations: Clarify dietary restrictions;Order thickener from pharmacy;Prohibited food (jello, ice cream, thin soups)   Thank you,  Genene Churn, [place] 02/27/2022,5:27 PM

## 2022-02-27 NOTE — Progress Notes (Signed)
Speech Language Pathology Treatment: Dysphagia  Patient Details Name: Robin Mcclure MRN: [id number] DOB: 1922/05/22 Today's Date: 02/27/2022 Time: 8099-8338 SLP Time Calculation (min) (ACUTE ONLY): 29 min  Assessment / Plan / Recommendation Clinical Impression  Ongoing diagnostic dysphagia therapy provided while Pt was sitting upright in bed; Pt presents with a very wet and congested cough at baseline. Pt consumed ice chips and sips of thin liquids and note occasional delayed coughing and wet vocal quality after thin presentations. Pt's daughter reports a congested cough when she is "at her best". Pt consumed puree textures without overt s/sx of aspiration. Secondary to wet vocal quality and inconsistent delayed coughing with thin trials recommend consider MBSS in order to objectively assess the swallowing function.     HPI HPI: 88 y.o. female with medical history significant of COPD, GERD, hypertension, hyperlipidemia, T2DM admitted on 02/21/2022 with intractable emesis and found to have partial small bowel obstruction  Versus ileus. in the early hours of 02/24/2022 patient developed significant respiratory distress with hypoxia O2 sats down to the 60s  -Required deep suctioning, bronchodilators, high flow oxygen  Arterial Blood Gas result:  pO2 44; pCO2 48; pH 7.3;  HCO3 23.6, %O2 Sat 76.8 on 8 L of [place]  -Currently requiring up to 11 L of oxygen via nasal cannula. BSE requested.      SLP Plan  Continue with current plan of care;MBS      Recommendations for follow up therapy are one component of a multi-disciplinary discharge planning process, led by the attending physician.  Recommendations may be updated based on patient status, additional functional criteria and insurance authorization.    Recommendations  Diet recommendations: Thin liquid;Dysphagia 1 (puree) Liquids provided via: Cup;No straw Medication Administration: Whole meds with puree Supervision: Patient able to self feed;Staff to  assist with self feeding;Intermittent supervision to cue for compensatory strategies Compensations: Slow rate;Small sips/bites;Multiple dry swallows after each bite/sip                Oral Care Recommendations: Oral care BID;Staff/trained caregiver to provide oral care Follow Up Recommendations: Skilled nursing-short term rehab (<3 hours/day) Assistance recommended at discharge: Intermittent Supervision/Assistance SLP Visit Diagnosis: Dysphagia, unspecified (R13.10) Plan: Continue with current plan of care;MBS          Robin Tarry H. Roddie Mc, CCC-SLP Speech Language Pathologist  Robin Mcclure  02/27/2022, 1:04 PM

## 2022-02-27 NOTE — Inpatient Diabetes Management (Signed)
Inpatient Diabetes Program Recommendations  AACE/ADA: New Consensus Statement on Inpatient Glycemic Control   Target Ranges:  Prepandial:   less than 140 mg/dL      Peak postprandial:   less than 180 mg/dL (1-2 hours)      Critically ill patients:  140 - 180 mg/dL    Latest Reference Range & Units 02/27/22 04:19 02/27/22 07:43 02/27/22 12:31  Glucose-Capillary 70 - 99 mg/dL 208 (H) 156 (H) 242 (H)    Latest Reference Range & Units 02/26/22 07:39 02/26/22 11:22 02/26/22 16:12 02/26/22 20:02  Glucose-Capillary 70 - 99 mg/dL 156 (H) 259 (H) 224 (H) 231 (H)   Review of Glycemic Control  Diabetes history: DM2 Outpatient Diabetes medications: Januvia 100 mg daily, Metformin 500 mg daily Current orders for Inpatient glycemic control: Novolog 0-9 units Q4H; D5 1/2NS @ 40 ml/hr  Inpatient Diabetes Program Recommendations:    Insulin: Please consider ordering Semglee 5 units Q24H.  Thanks, Barnie Alderman, RN, MSN, [facility] Diabetes Coordinator Inpatient Diabetes Program [phone] (Team Pager from 8am to [place])

## 2022-02-27 NOTE — Progress Notes (Addendum)
PROGRESS NOTE     Robin Mcclure, is a 88 y.o. female, DOB - Nov 07, 1922, YDX:[id number]  Admit date - 02/21/2022   Admitting Physician Bernadette Hoit, DO  Outpatient Primary MD for the patient is Mcclure, [name], MD  LOS - 6  Chief Complaint  Patient presents with   Emesis        Brief Narrative:   88 y.o. female with medical history significant of COPD, GERD, hypertension, hyperlipidemia, T2DM admitted on 02/21/2022 with intractable emesis and found to have partial small bowel obstruction  Versus ileus -Unfortunately around 02/24/2022 patient had an aspiration episode  -Assessment and Plan:  1)PSBO Vs Ileus--- abdominal and chest x-ray from 02/23/22 suggested possible abnormalities of the chest x-ray with regards to vascular structures of the chest--radiologist recommended CT with contrast -CT abdomen and pelvis and CT chest with contrast on 02/23/2022 showed no significant vascular abnormalities of the chest---Shows resolving small bowel obstruction versus ileus--with contrast in the colon -Previously discussed with general surgeon Dr. Constance Haw... She reviewed the images -Conservative management advised possible aspiration episode on 02/24/2022-- --Dulcolax suppository -IV antiemetics as needed --Clinically and radiologically SBO appears to be resolved -Patient had BM, passing gas -NG tube discontinued on 02/26/2022  2)Hypomagnesemia/hypokalemia --- replace and recheck,    3)acute hypoxic respiratory failure due to aspiration pneumonia -in the early hours of 02/24/2022 patient developed significant respiratory distress with hypoxia O2 sats down to the 60s -Required deep suctioning, bronchodilators, high flow oxygen Arterial Blood Gas result:  pO2 44; pCO2 48; pH 7.3;  HCO3 23.6, %O2 Sat 76.8 on 8 L of [place] -Currently requiring up to 11 L of oxygen via nasal cannula C/n  Augmentin started on 02/24/22--for aspiration pneumonia -Continue mucolytics, bronchodilators and supplemental  oxygen - speech evaluation appreciated, dysphagia 1/pured diet recommended with thin liquids -NG tube removed on 02/26/2022 -- Patient to remain in stepdown -continue BiPAP alternating with high flow oxygen -Serial chest x-rays  suggest bilateral infiltrates/aspiration -Leukocytosis persist  4)HTN- Hold nifedipine  may use IV Hydralazine 10 mg  Every 4 hours Prn for systolic blood pressure over 160 mmhg  5)HyperNatremia/Hyperchloremia with non-anion gap metabolic Acidosis--- due to free water deficit/dehydration in the setting of small bowel obstruction on n.p.o. status -Patient received IV fluids and bicarb infusion prior to repeat contrast study on 02/23/2022 since patient already had a contrast study on 02/21/2022 -Continue gentle hydration until oral intake improves  6) hypophosphatemia----risk of refeeding syndrome -Replace IV and recheck  7)DM2-A1c 6.4 reflecting excellent diabetic control PTA -Hold Januvia and metformin Use Novolog/Humalog Sliding scale insulin with Accu-Cheks/Fingersticks as ordered   8)Social/ethics--- plan of care and advanced directive discussed with patient and patient's daughter Robin Mcclure -Patient is a full code -Okay for BiPAP as needed  9) generalized weakness and deconditioning/ambulatory dysfunction--- get physical therapy eval  Prophylaxis:- -Lovenox for DVT prophylaxis and Pepcid for GI prophylaxis  Disposition/Need for in-Hospital Stay- patient unable to be discharged at this time due to - -acute hypoxic respiratory failure with concern for possible aspiration requiring further investigations and interventions  Status is: Inpatient   Disposition: The patient is from: Home              Anticipated d/c is to: Home              Anticipated d/c date is: > 3 days              Patient currently is not medically stable to d/c. Barriers: Not Clinically Stable-   Code  Status :  -  Code Status: Full Code   Family Communication:   Discussed  with patient's daughter Robin Mcclure  DVT Prophylaxis  :   - SCDs   enoxaparin (LOVENOX) injection 40 mg Start: 02/25/22 1000 SCDs Start: 02/21/22 2316   Lab Results  Component Value Date   PLT 238 02/27/2022   Inpatient Medications  Scheduled Meds:  amoxicillin-clavulanate  1 tablet Oral Q8H   bisacodyl  10 mg Rectal QPM   Chlorhexidine Gluconate Cloth  6 each Topical Daily   dextromethorphan-guaiFENesin  1 tablet Oral BID   enoxaparin (LOVENOX) injection  40 mg Subcutaneous Q24H   insulin aspart  0-9 Units Subcutaneous Q4H   mometasone-formoterol  2 puff Inhalation BID   NIFEdipine  90 mg Oral q morning   olopatadine  1 drop Both Eyes BID   mouth rinse  15 mL Mouth Rinse 4 times per day   pravastatin  20 mg Oral q morning   sodium bicarbonate  650 mg Oral TID   Continuous Infusions:  dextrose 5 % and 0.45% NaCl 40 mL/hr at 02/27/22 0302   famotidine (PEPCID) IV Stopped (02/26/22 1904)   potassium PHOSPHATE IVPB (in mmol) 30 mmol (02/27/22 1031)   PRN Meds:.acetaminophen, acetaminophen, albuterol, albuterol, fentaNYL (SUBLIMAZE) injection, hydrALAZINE, ondansetron (ZOFRAN) IV, mouth rinse   Anti-infectives (From admission, onward)    Start     Dose/Rate Route Frequency Ordered Stop   02/26/22 2200  amoxicillin-clavulanate (AUGMENTIN) 500-125 MG per tablet 1 tablet        1 tablet Oral Every 8 hours 02/26/22 2053     02/26/22 1000  amoxicillin-clavulanate (AUGMENTIN) 200-28.5 MG/5ML suspension 500 mg  Status:  Discontinued        500 mg of amoxicillin Per Tube 3 times daily 02/26/22 0815 02/26/22 2053   02/24/22 1530  amoxicillin-clavulanate (AUGMENTIN) 200-28.5 MG/5ML suspension 876 mg  Status:  Discontinued        875 mg of amoxicillin Per Tube Every 12 hours 02/24/22 1430 02/26/22 0815   02/24/22 1515  amoxicillin-clavulanate (AUGMENTIN) 400-57 MG/5ML suspension 875 mg  Status:  Discontinued        875 mg Per Tube Every 12 hours 02/24/22 1418 02/24/22 1430        Subjective: [name] today has no fevers, no further emesis,  No chest pain,   - Resting comfortably -Cough and hypoxia persist but is not worse -Tolerating some oral intake but not great -Had BM on 02/26/2022    Objective: Vitals:   02/27/22 0500 02/27/22 0600 02/27/22 0728 02/27/22 1138  BP: 135/64 138/82    Pulse: 88 94    Resp: (!) 26 (!) 31    Temp:   98.2 F (36.8 C) 98.3 F (36.8 C)  TempSrc:   Oral Oral  SpO2: 94% 93%    Weight:      Height:        Intake/Output Summary (Last 24 hours) at 02/27/2022 1147 Last data filed at 02/27/2022 0421 Gross per 24 hour  Intake 774.03 ml  Output 200 ml  Net 574.03 ml   Filed Weights   02/25/22 0522 02/26/22 0500 02/27/22 0420  Weight: 58.8 kg 58.8 kg 60.8 kg   Physical Exam  Gen:- Awake Alert, in no apparent distress  HEENT:- [place].AT, No sclera icterus Ears- HOH Nose- NG tube to be clamped/[place] 11L/min/Bipap Neck-Supple Neck,No JVD,.  Lungs--air movement improving, scattered rhonchi and wheezes -CV- S1, S2 normal, regular  Abd-  +ve B.Sounds, Abd Soft, No  significant tenderness,    Extremity/Skin:-Trace edema, pedal pulses present  Psych-affect is appropriate, oriented x3 Neuro-generalized weakness, no new focal deficits, no tremors  Data Reviewed: I have personally reviewed following labs and imaging studies  CBC: Recent Labs  Lab 02/21/22 1534 02/22/22 0029 02/23/22 0422 02/25/22 0502 02/26/22 0351 02/27/22 0434  WBC 5.3 3.9* 5.9 13.6* 11.9* 12.8*  NEUTROABS 3.5  --   --   --   --   --   HGB 12.4 11.9* 11.1* 11.7* 11.6* 10.9*  HCT 37.5 35.9* 35.1* 36.8 35.8* 32.9*  MCV 94.7 95.7 98.9 98.1 96.5 95.4  PLT 247 234 228 234 253 132   Basic Metabolic Panel: Recent Labs  Lab 02/22/22 0029 02/23/22 0422 02/24/22 0810 02/25/22 0502 02/26/22 0351 02/27/22 0434  NA 139 146* 139 143 144 142  K 3.7 3.5 3.0* 3.8 3.5 4.0  CL 111 117* 109 114* 114* 114*  CO2 20* 18* 20* 21* 20* 19*  GLUCOSE 186* 114* 198*  151* 126* 205*  BUN 29* 28* 19 24* 26* 30*  CREATININE 0.97 0.86 0.69 0.84 0.83 0.83  CALCIUM 9.0 9.2 8.9 9.2 9.3 9.2  MG 1.5*  --   --   --   --  1.7  PHOS 2.9  --   --   --   --  1.3*   GFR: Estimated Creatinine Clearance: 34.9 mL/min (by C-G formula based on SCr of 0.83 mg/dL). Liver Function Tests: Recent Labs  Lab 02/21/22 1534 02/22/22 0029 02/27/22 0434  AST 12* 11*  --   ALT 12 12  --   ALKPHOS 66 58  --   BILITOT 1.3* 1.1  --   PROT 6.6 6.1*  --   ALBUMIN 3.2* 3.0* 2.6*   HbA1C: No results for input(s): "HGBA1C" in the last 72 hours.  Radiology Studies: DG CHEST PORT 1 VIEW  Result Date: 02/27/2022 CLINICAL DATA:  Aspiration into airway. EXAM: PORTABLE CHEST 1 VIEW COMPARISON:  02/26/2022 and 02/24/2022 FINDINGS: Increased focal density along the periphery of the right upper lung. Slightly increased airspace densities in the medial aspect of the right upper lobe. Again noted are densities at the left lung base and the left hemidiaphragm is obscured. Findings could represent a combination of pleural fluid and consolidation at the left lung base. Heart size is stable. Trachea is midline. Negative for a pneumothorax. Nasogastric tube has been removed. Surgical clips in the right upper abdomen. IMPRESSION: 1. Increased airspace densities in the right upper lobe. Findings are concerning for pneumonia. 2. Left basilar densities could represent a combination of pleural fluid and airspace disease/consolidation. Electronically Signed   By: Markus Daft M.D.   On: 02/27/2022 08:58   DG ABD ACUTE 2+V W 1V CHEST  Result Date: 02/26/2022 CLINICAL DATA:  Aspiration.  History of small-bowel obstruction. EXAM: DG ABDOMEN ACUTE WITH 1 VIEW CHEST COMPARISON:  Abdominal radiograph 02/25/2022 FINDINGS: Enteric tube appears coiled within the stomach. Stable cardiomegaly and hilar prominence. Similar-appearing left-greater-than-right basilar airspace opacities which may represent atelectasis.  Possible trace bilateral pleural effusions. No pneumothorax. Thoracic spine degenerative changes. Oral contrast material within the descending colon. There are a few gaseous distended loops of small bowel within the central abdomen. Surgical clips right upper quadrant. Right hip arthroplasty. Lumbar spine degenerative changes. IMPRESSION: 1. A few gaseous distended loops of small bowel within the central abdomen, similar to prior. 2. Bibasilar atelectasis. 3. Oral contrast material within the descending colon. 4. Enteric tube appears coiled within the stomach.  Electronically Signed   By: Lovey Newcomer M.D.   On: 02/26/2022 08:08    Scheduled Meds:  amoxicillin-clavulanate  1 tablet Oral Q8H   bisacodyl  10 mg Rectal QPM   Chlorhexidine Gluconate Cloth  6 each Topical Daily   dextromethorphan-guaiFENesin  1 tablet Oral BID   enoxaparin (LOVENOX) injection  40 mg Subcutaneous Q24H   insulin aspart  0-9 Units Subcutaneous Q4H   mometasone-formoterol  2 puff Inhalation BID   NIFEdipine  90 mg Oral q morning   olopatadine  1 drop Both Eyes BID   mouth rinse  15 mL Mouth Rinse 4 times per day   pravastatin  20 mg Oral q morning   sodium bicarbonate  650 mg Oral TID   Continuous Infusions:  dextrose 5 % and 0.45% NaCl 40 mL/hr at 02/27/22 0302   famotidine (PEPCID) IV Stopped (02/26/22 1904)   potassium PHOSPHATE IVPB (in mmol) 30 mmol (02/27/22 1031)    LOS: 6 days   Roxan Hockey M.D on 02/27/2022 at 11:47 AM  Go to [email or web address] - for contact info  [facility] - Office  [phone]  If 7PM-7AM, please contact night-coverage [email or web address] 02/27/2022, 11:47 AM

## 2022-02-28 ENCOUNTER — Inpatient Hospital Stay (HOSPITAL_COMMUNITY): Payer: MEDICARE

## 2022-02-28 DIAGNOSIS — J9601 Acute respiratory failure with hypoxia: Secondary | ICD-10-CM | POA: Diagnosis not present

## 2022-02-28 LAB — GLUCOSE, CAPILLARY
Glucose-Capillary: 275 mg/dL — ABNORMAL HIGH (ref 70–99)
Glucose-Capillary: 181 mg/dL — ABNORMAL HIGH (ref 70–99)
Glucose-Capillary: 188 mg/dL — ABNORMAL HIGH (ref 70–99)
Glucose-Capillary: 195 mg/dL — ABNORMAL HIGH (ref 70–99)

## 2022-02-28 LAB — RENAL FUNCTION PANEL
Albumin: 2.5 g/dL — ABNORMAL LOW (ref 3.5–5.0)
Anion gap: 7 (ref 5–15)
BUN: 26 mg/dL — ABNORMAL HIGH (ref 8–23)
CO2: 21 mmol/L — ABNORMAL LOW (ref 22–32)
Calcium: 8.8 mg/dL — ABNORMAL LOW (ref 8.9–10.3)
Chloride: 111 mmol/L (ref 98–111)
Creatinine, Ser: 0.7 mg/dL (ref 0.44–1.00)
GFR, Estimated: >60 mL/min (ref >60)
Glucose, Bld: 269 mg/dL — ABNORMAL HIGH (ref 70–99)
Phosphorus: 2.4 mg/dL — ABNORMAL LOW (ref 2.5–4.6)
Potassium: 4 mmol/L (ref 3.5–5.1)
Sodium: 139 mmol/L (ref 135–145)

## 2022-02-28 MED ORDER — SODIUM CHLORIDE 0.9 % IV SOLN: INTRAVENOUS | Status: DC

## 2022-02-28 NOTE — Progress Notes (Signed)
PROGRESS NOTE     Robin Mcclure, is a 88 y.o. female, DOB - Apr 23, 1923, CNO:[id number]  Admit date - 02/21/2022   Admitting Physician Bernadette Hoit, DO  Outpatient Primary MD for the patient is [name], [name], MD  LOS - 7  Chief Complaint  Patient presents with   Emesis        Brief Narrative:   88 y.o. female with medical history significant of COPD, GERD, hypertension, hyperlipidemia, T2DM admitted on 02/21/2022 with intractable emesis and found to have partial small bowel obstruction  Versus ileus -Unfortunately around 02/24/2022 patient had an aspiration episode  -Assessment and Plan:  1)PSBO Vs Ileus--- abdominal and chest x-ray from 02/23/22 suggested possible abnormalities of the chest x-ray with regards to vascular structures of the chest--radiologist recommended CT with contrast -CT abdomen and pelvis and CT chest with contrast on 02/23/2022 showed no significant vascular abnormalities of the chest---Shows resolving small bowel obstruction versus ileus--with contrast in the colon -Previously discussed with general surgeon Dr. Constance Haw... She reviewed the images -Conservative management advised possible aspiration episode on 02/24/2022-- --Dulcolax suppository -IV antiemetics as needed --Clinically and radiologically SBO appears to be resolved -Patient had BM, passing gas -NG tube discontinued on 02/26/2022 -Repeat x-rays from 02/28/2022 pending  2)Dysphagia--- swallowing difficulties persist -Speech pathologist recommends pured solids and nectar thickened liquids -02/28/22 -Oral intake remains challenging--- concerns for aspiration risk  3)acute hypoxic respiratory failure due to aspiration pneumonia -in the early hours of 02/24/2022 patient developed significant respiratory distress with hypoxia O2 sats down to the 60s -Required deep suctioning, bronchodilators, high flow oxygen Arterial Blood Gas result:  pO2 44; pCO2 48; pH 7.3;  HCO3 23.6, %O2 Sat 76.8 on 8 L of  [place] -Currently requiring up to 10 L of oxygen via nasal cannula C/n  Augmentin started on 02/24/22--for aspiration pneumonia -Continue mucolytics, bronchodilators and supplemental oxygen - speech evaluation appreciated, dysphagia 1/pured diet recommended with thin liquids -NG tube removed on 02/26/2022 -- Patient to remain in stepdown -continue BiPAP alternating with high flow oxygen -Serial chest x-rays  suggest bilateral infiltrates/aspiration -Leukocytosis persist  4)HTN- Hold nifedipine  may use IV Hydralazine 10 mg  Every 4 hours Prn for systolic blood pressure over 160 mmhg  5)HyperNatremia/Hyperchloremia with non-anion gap metabolic Acidosis--- due to free water deficit/dehydration in the setting of small bowel obstruction on n.p.o. status -Patient received IV fluids and bicarb infusion prior to repeat contrast study on 02/23/2022 since patient already had a contrast study on 02/21/2022 02/28/22 -Sodium and chloride ihas normalized -Metabolic acidosis improving -Continue gentle hydration until oral intake improves  6)Hypophosphatemia/hypokalemia and hypomagnesemia----risk of refeeding syndrome -Potassium and magnesium is normalized -Phosphorus has improved -Replace  and recheck  7)DM2-A1c 6.4 reflecting excellent diabetic control PTA -Hold Januvia and metformin 02/28/22 -Hyperglycemia noted most likely due to dextrose infusion IV -Change IV fluids to normal saline -If hyperglycemia persists consider adding Lantus 5 units nightly Use Novolog/Humalog Sliding scale insulin with Accu-Cheks/Fingersticks as ordered   8)Social/ethics--- plan of care and advanced directive discussed with patient and patient's daughter [name] -Patient is a full code -Okay for BiPAP as needed  9) generalized weakness and deconditioning/ambulatory dysfunction--- get physical therapy eval  Prophylaxis:- -Lovenox for DVT prophylaxis and Pepcid for GI prophylaxis  Disposition/Need for  in-Hospital Stay- patient unable to be discharged at this time due to - -acute hypoxic respiratory failure due to aspiration pneumonia requiring high flow oxygen -Currently requiring up to 10 L of oxygen  -Becomes dyspneic with positional change  Status is: Inpatient   Disposition: The patient is from: Home              Anticipated d/c is to: SNF              Anticipated d/c date is: > 3 days              Patient currently is not medically stable to d/c. Barriers: Not Clinically Stable-   Code Status :  -  Code Status: Full Code   Family Communication:    patient's daughter [name] is primary contact  DVT Prophylaxis  :   - SCDs   enoxaparin (LOVENOX) injection 40 mg Start: 02/25/22 1000 SCDs Start: 02/21/22 2316   Lab Results  Component Value Date   PLT 238 02/27/2022   Inpatient Medications  Scheduled Meds:  amoxicillin-clavulanate  1 tablet Oral Q8H   bisacodyl  10 mg Rectal QPM   Chlorhexidine Gluconate Cloth  6 each Topical Daily   dextromethorphan-guaiFENesin  1 tablet Oral BID   enoxaparin (LOVENOX) injection  40 mg Subcutaneous Q24H   insulin aspart  0-5 Units Subcutaneous QHS   insulin aspart  0-9 Units Subcutaneous TID WC   mometasone-formoterol  2 puff Inhalation BID   NIFEdipine  90 mg Oral q morning   olopatadine  1 drop Both Eyes BID   mouth rinse  15 mL Mouth Rinse 4 times per day   pravastatin  20 mg Oral q morning   sodium bicarbonate  650 mg Oral TID   Continuous Infusions:  sodium chloride     dextrose 5 % and 0.45% NaCl 10 mL/hr at 02/28/22 0824   famotidine (PEPCID) IV 100 mL/hr at 02/28/22 0311   PRN Meds:.acetaminophen, acetaminophen, albuterol, albuterol, fentaNYL (SUBLIMAZE) injection, hydrALAZINE, ondansetron (ZOFRAN) IV, mouth rinse   Anti-infectives (From admission, onward)    Start     Dose/Rate Route Frequency Ordered Stop   02/26/22 2200  amoxicillin-clavulanate (AUGMENTIN) 500-125 MG per tablet 1 tablet        1 tablet  Oral Every 8 hours 02/26/22 2053     02/26/22 1000  amoxicillin-clavulanate (AUGMENTIN) 200-28.5 MG/5ML suspension 500 mg  Status:  Discontinued        500 mg of amoxicillin Per Tube 3 times daily 02/26/22 0815 02/26/22 2053   02/24/22 1530  amoxicillin-clavulanate (AUGMENTIN) 200-28.5 MG/5ML suspension 876 mg  Status:  Discontinued        875 mg of amoxicillin Per Tube Every 12 hours 02/24/22 1430 02/26/22 0815   02/24/22 1515  amoxicillin-clavulanate (AUGMENTIN) 400-57 MG/5ML suspension 875 mg  Status:  Discontinued        875 mg Per Tube Every 12 hours 02/24/22 1418 02/24/22 1430       Subjective: [name] today has no fevers, no further emesis,  No chest pain,   - 02/28/22 -Oral intake remains challenging--- concerns for aspiration risk RN Brittney at bedside -Cough persist -Becomes dyspneic with positional change requiring high flow oxygen    Objective: Vitals:   02/28/22 0900 02/28/22 0936 02/28/22 1000 02/28/22 1005  BP: 104/61 104/61 (!) 129/56   Pulse: (!) 106  (!) 107   Resp: 20  15   Temp:      TempSrc:      SpO2: 93%  (!) 87% 94%  Weight:      Height:        Intake/Output Summary (Last 24 hours) at 02/28/2022 1042 Last data filed at 02/28/2022 0533 Gross per 24  hour  Intake 1101.86 ml  Output 350 ml  Net 751.86 ml   Filed Weights   02/25/22 0522 02/26/22 0500 02/27/22 0420  Weight: 58.8 kg 58.8 kg 60.8 kg   Physical Exam  Gen:- Awake Alert, in no apparent distress  HEENT:- [place].AT, No sclera icterus Ears- HOH Nose- [place] 10L/min Neck-Supple Neck,No JVD,.  Lungs--air movement improving, scattered rhonchi and wheezes -CV- S1, S2 normal, regular  Abd-  +ve B.Sounds, Abd Soft, No significant tenderness,    Extremity/Skin:- +2 UE  edema, Trace LE edema, pedal pulses present  Psych-affect is appropriate, oriented x3 Neuro-generalized weakness, no new focal deficits, no tremors  Data Reviewed: I have personally reviewed following labs and imaging  studies  CBC: Recent Labs  Lab 02/21/22 1534 02/22/22 0029 02/23/22 0422 02/25/22 0502 02/26/22 0351 02/27/22 0434  WBC 5.3 3.9* 5.9 13.6* 11.9* 12.8*  NEUTROABS 3.5  --   --   --   --   --   HGB 12.4 11.9* 11.1* 11.7* 11.6* 10.9*  HCT 37.5 35.9* 35.1* 36.8 35.8* 32.9*  MCV 94.7 95.7 98.9 98.1 96.5 95.4  PLT 247 234 228 234 253 053   Basic Metabolic Panel: Recent Labs  Lab 02/22/22 0029 02/23/22 0422 02/24/22 0810 02/25/22 0502 02/26/22 0351 02/27/22 0434 02/28/22 0453  NA 139   < > 139 143 144 142 139  K 3.7   < > 3.0* 3.8 3.5 4.0 4.0  CL 111   < > 109 114* 114* 114* 111  CO2 20*   < > 20* 21* 20* 19* 21*  GLUCOSE 186*   < > 198* 151* 126* 205* 269*  BUN 29*   < > 19 24* 26* 30* 26*  CREATININE 0.97   < > 0.69 0.84 0.83 0.83 0.70  CALCIUM 9.0   < > 8.9 9.2 9.3 9.2 8.8*  MG 1.5*  --   --   --   --  1.7  --   PHOS 2.9  --   --   --   --  1.3* 2.4*   < > = values in this interval not displayed.   GFR: Estimated Creatinine Clearance: 36.2 mL/min (by C-G formula based on SCr of 0.7 mg/dL). Liver Function Tests: Recent Labs  Lab 02/21/22 1534 02/22/22 0029 02/27/22 0434 02/28/22 0453  AST 12* 11*  --   --   ALT 12 12  --   --   ALKPHOS 66 58  --   --   BILITOT 1.3* 1.1  --   --   PROT 6.6 6.1*  --   --   ALBUMIN 3.2* 3.0* 2.6* 2.5*   HbA1C: No results for input(s): "HGBA1C" in the last 72 hours.  Radiology Studies: Leanne Chang SPEECH PATH  Result Date: 02/27/2022 Table formatting from the original result was not included. Objective Swallowing Evaluation: Type of Study: MBS-Modified Barium Swallow Study  Patient Details Name: Robin Mcclure MRN: [id number] Date of Birth: 10-14-1922 Today's Date: 02/27/2022 Time: SLP Start Time (ACUTE ONLY): 1400 -SLP Stop Time (ACUTE ONLY): 7902 SLP Time Calculation (min) (ACUTE ONLY): 33 min Past Medical History: Past Medical History: Diagnosis Date  Diabetes mellitus without complication ([place])   Hyperlipidemia   Hypertension   Past Surgical History: Past Surgical History: Procedure Laterality Date  ABDOMINAL HYSTERECTOMY    BIOPSY  01/19/2018  Procedure: BIOPSY;  Surgeon: Ronnette Juniper, MD;  Location: [facility];  Service: Gastroenterology;;  CHOLECYSTECTOMY    ESOPHAGOGASTRODUODENOSCOPY (EGD) WITH PROPOFOL N/A 01/19/2018  Procedure: ESOPHAGOGASTRODUODENOSCOPY (EGD) WITH PROPOFOL;  Surgeon: Ronnette Juniper, MD;  Location: [facility];  Service: Gastroenterology;  Laterality: N/A;  HIP SURGERY   HPI: 88 y.o. female with medical history significant of COPD, GERD, hypertension, hyperlipidemia, T2DM admitted on 02/21/2022 with intractable emesis and found to have partial small bowel obstruction  Versus ileus. in the early hours of 02/24/2022 patient developed significant respiratory distress with hypoxia O2 sats down to the 60s  -Required deep suctioning, bronchodilators, high flow oxygen  Arterial Blood Gas result:  pO2 44; pCO2 48; pH 7.3;  HCO3 23.6, %O2 Sat 76.8 on 8 L of [place]  -Currently requiring up to 11 L of oxygen via nasal cannula. BSE requested.  Subjective: "I am getting so tired."  Recommendations for follow up therapy are one component of a multi-disciplinary discharge planning process, led by the attending physician.  Recommendations may be updated based on patient status, additional functional criteria and insurance authorization. Assessment / Plan / Recommendation   02/27/2022   4:00 PM Clinical Impressions Clinical Impression Pt presents with mi/mod oropharyngeal and suspected primary esophageal dysphagia characterized by weak lingual manipulation and prolonged oral transit, swallow trigger at the level of the valleculae, decreased tongue base retraction and epiglottic deflection which appeared to be negatively impacted by elongated uvulae that nearly meat the tip of the epiglottis and impaired epiglottic deflection and approximation with posterior pharyngeal wall resulting in reduced laryngeal vestibule closure and penetration of  thins during the swallow and penetration/aspiration to the vocal folds (difficult to see if below vocal folds at times) in trace amounts and resulting vallecular residue and occasional contrast behind the uvulae. Epiglottis deflected greater with puree. Nectars only spilled over into the laryngeal vestibule in trace amounts, but also resulted in vallecular residue. Esophageal sweep revealed extensive barium contrast diffusely throughout the esophagus that resulted in some backflow/retrograde movement to the cervical esophagus. Pt still currently requires high supplemental oxygen and fatigues quickly, which increases risk for aspiration and decreases her ability to meet nutritional needs. Pt's daughter indicates that Pt was on nectar thickened liquids about 1-2 years ago and "did not like it". SLP reviewed imaging with Pt's daughter and suggested that we change her to NTL for now, with plan to advance her once she is clinically stronger. Daughter is willing to try this. Question whether Pt's appearance of elongated uvula could be due to some swelling from recent NG placement? Recommend D1/puree and NTL via cup/straw with pacing strategies due to shortness of breath and fatigue and ensure that Pt is sitting upright for all eating/drinking and remains up for at least 30 minutes after meals due to esophageal dsymotility. SLP will follow during acute stay. SLP Visit Diagnosis Dysphagia, pharyngoesophageal phase (R13.14);Dysphagia, oropharyngeal phase (R13.12) Impact on safety and function Mild aspiration risk;Risk for inadequate nutrition/hydration     02/27/2022   4:00 PM Treatment Recommendations Treatment Recommendations Therapy as outlined in treatment plan below     02/27/2022   4:00 PM Prognosis Prognosis for Safe Diet Advancement Good   02/27/2022   4:00 PM Diet Recommendations SLP Diet Recommendations Dysphagia 1 (Puree) solids;Nectar thick liquid Liquid Administration via Cup;Straw Medication Administration  Whole meds with puree Compensations Slow rate;Small sips/bites;Multiple dry swallows after each bite/sip Postural Changes Remain semi-upright after after feeds/meals (Comment);Seated upright at 90 degrees     02/27/2022   4:00 PM Other Recommendations Recommended Consults Consider esophageal assessment Oral Care Recommendations Oral care BID;Staff/trained caregiver to provide oral care Other Recommendations Clarify dietary restrictions;Order thickener  from pharmacy;Prohibited food (jello, ice cream, thin soups) Follow Up Recommendations Skilled nursing-short term rehab (<3 hours/day) Assistance recommended at discharge Frequent or constant Supervision/Assistance Functional Status Assessment Patient has had a recent decline in their functional status and demonstrates the ability to make significant improvements in function in a reasonable and predictable amount of time.   02/27/2022   4:00 PM Frequency and Duration  Speech Therapy Frequency (ACUTE ONLY) min 2x/week Treatment Duration 1 week     02/27/2022   4:00 PM Oral Phase Oral Phase Impaired Oral - Mech Soft Weak lingual manipulation;Delayed oral transit;Piecemeal swallowing    02/27/2022   4:00 PM Pharyngeal Phase Pharyngeal Phase Impaired Pharyngeal- Nectar Straw Delayed swallow initiation-vallecula;Reduced epiglottic inversion;Penetration/Aspiration during swallow;Pharyngeal residue - valleculae Pharyngeal Material enters airway, remains ABOVE vocal cords then ejected out Pharyngeal- Thin Teaspoon Delayed swallow initiation-vallecula;Reduced epiglottic inversion;Reduced tongue base retraction;Penetration/Aspiration during swallow;Penetration/Apiration after swallow;Pharyngeal residue - valleculae Pharyngeal Material enters airway, CONTACTS cords and not ejected out;Material enters airway, remains ABOVE vocal cords and not ejected out Pharyngeal- Thin Cup Delayed swallow initiation-vallecula;Reduced epiglottic inversion;Reduced airway/laryngeal closure;Reduced  tongue base retraction;Penetration/Aspiration during swallow;Penetration/Apiration after swallow;Trace aspiration;Pharyngeal residue - valleculae Pharyngeal Material enters airway, CONTACTS cords and not ejected out Pharyngeal- Thin Straw Delayed swallow initiation-vallecula;Delayed swallow initiation-pyriform sinuses;Reduced tongue base retraction;Reduced epiglottic inversion;Penetration/Aspiration during swallow;Pharyngeal residue - valleculae Pharyngeal Material enters airway, remains ABOVE vocal cords and not ejected out Pharyngeal- Puree Delayed swallow initiation-vallecula;Reduced tongue base retraction;Reduced epiglottic inversion;Pharyngeal residue - valleculae Pharyngeal- Mechanical Soft Reduced epiglottic inversion;Reduced tongue base retraction;Delayed swallow initiation-vallecula;Pharyngeal residue - valleculae Pharyngeal- Pill WFL;Delayed swallow initiation-vallecula    02/27/2022   4:00 PM Cervical Esophageal Phase  Cervical Esophageal Phase Impaired Puree Esophageal backflow into cervical esophagus Thank you, Genene Churn, [place] 02/27/2022, 5:27 PM                     DG CHEST PORT 1 VIEW  Result Date: 02/27/2022 CLINICAL DATA:  Aspiration into airway. EXAM: PORTABLE CHEST 1 VIEW COMPARISON:  02/26/2022 and 02/24/2022 FINDINGS: Increased focal density along the periphery of the right upper lung. Slightly increased airspace densities in the medial aspect of the right upper lobe. Again noted are densities at the left lung base and the left hemidiaphragm is obscured. Findings could represent a combination of pleural fluid and consolidation at the left lung base. Heart size is stable. Trachea is midline. Negative for a pneumothorax. Nasogastric tube has been removed. Surgical clips in the right upper abdomen. IMPRESSION: 1. Increased airspace densities in the right upper lobe. Findings are concerning for pneumonia. 2. Left basilar densities could represent a combination  of pleural fluid and airspace disease/consolidation. Electronically Signed   By: Markus Daft M.D.   On: 02/27/2022 08:58    Scheduled Meds:  amoxicillin-clavulanate  1 tablet Oral Q8H   bisacodyl  10 mg Rectal QPM   Chlorhexidine Gluconate Cloth  6 each Topical Daily   dextromethorphan-guaiFENesin  1 tablet Oral BID   enoxaparin (LOVENOX) injection  40 mg Subcutaneous Q24H   insulin aspart  0-5 Units Subcutaneous QHS   insulin aspart  0-9 Units Subcutaneous TID WC   mometasone-formoterol  2 puff Inhalation BID   NIFEdipine  90 mg Oral q morning   olopatadine  1 drop Both Eyes BID   mouth rinse  15 mL Mouth Rinse 4 times per day   pravastatin  20 mg Oral q morning   sodium bicarbonate  650 mg Oral TID   Continuous Infusions:  sodium chloride     dextrose 5 % and 0.45% NaCl 10 mL/hr at 02/28/22 0824   famotidine (PEPCID) IV 100 mL/hr at 02/28/22 0311    LOS: 7 days   Roxan Hockey M.D on 02/28/2022 at 10:42 AM  Go to [email or web address] - for contact info  [facility] - Office  [phone]  If 7PM-7AM, please contact night-coverage [email or web address] 02/28/2022, 10:42 AM

## 2022-02-28 NOTE — Progress Notes (Signed)
Speech Language Pathology Treatment: Dysphagia  Patient Details Name: Robin Mcclure MRN: [id number] DOB: 1923-02-01 Today's Date: 02/28/2022 Time: 7681-1572 SLP Time Calculation (min) (ACUTE ONLY): 20 min  Assessment / Plan / Recommendation Clinical Impression  Pt seen for ongoing dysphagia intervention following MBSS completed yesterday. Pt was changed to NECTAR-thick liquids and continued puree. PO intake has been limited. Pt's daughter reports that Pt prefers the thickened teas and juices over the thickened lemon water. Pt continues require high oxygen demands and reports shortness of breath and fatigue. She was agreeable to modest intake of ice chips and nectar thick liquids, before declining addition intake. Pt without signs of symptoms of reduced airway protection. She continues to present with congested cough before and after PO. NO gross aspiration was observed during MBSS, however Pt with substantial esophageal dysphagia with retention of barium throughout the esophagus. Continue diet as ordered D1/puree and NTL and ok for single ice chips between meals for comfort. Consider RD consult and MAGIC CUPS on each tray (BERRY flavor) and also palliative care consult, as Pt is followed by palliative as an outpatient. Family is in agreement . SLP will continue to follow during acute stay.     HPI HPI: 88 y.o. female with medical history significant of COPD, GERD, hypertension, hyperlipidemia, T2DM admitted on 02/21/2022 with intractable emesis and found to have partial small bowel obstruction  Versus ileus. in the early hours of 02/24/2022 patient developed significant respiratory distress with hypoxia O2 sats down to the 60s  -Required deep suctioning, bronchodilators, high flow oxygen  Arterial Blood Gas result:  pO2 44; pCO2 48; pH 7.3;  HCO3 23.6, %O2 Sat 76.8 on 8 L of [place]  -Currently requiring up to 11 L of oxygen via nasal cannula. BSE requested.      SLP Plan  Continue with current plan of  care      Recommendations for follow up therapy are one component of a multi-disciplinary discharge planning process, led by the attending physician.  Recommendations may be updated based on patient status, additional functional criteria and insurance authorization.    Recommendations  Diet recommendations: Dysphagia 1 (puree);Nectar-thick liquid Liquids provided via: Cup;Straw Medication Administration: Whole meds with puree Supervision: Patient able to self feed;Staff to assist with self feeding;Intermittent supervision to cue for compensatory strategies Compensations: Slow rate;Small sips/bites;Multiple dry swallows after each bite/sip Postural Changes and/or Swallow Maneuvers: Seated upright 90 degrees;Upright 30-60 min after meal                Oral Care Recommendations: Oral care BID;Staff/trained caregiver to provide oral care Follow Up Recommendations: Skilled nursing-short term rehab (<3 hours/day) Assistance recommended at discharge: Frequent or constant Supervision/Assistance SLP Visit Diagnosis: Dysphagia, pharyngeal phase (R13.13);Dysphagia, pharyngoesophageal phase (R13.14) Plan: Continue with current plan of care          Thank you,  Genene Churn, [place]  02/28/2022, 2:49 PM

## 2022-02-28 NOTE — Inpatient Diabetes Management (Signed)
Inpatient Diabetes Program Recommendations  AACE/ADA: New Consensus Statement on Inpatient Glycemic Control  Target Ranges:  Prepandial:   less than 140 mg/dL      Peak postprandial:   less than 180 mg/dL (1-2 hours)      Critically ill patients:  140 - 180 mg/dL    Latest Reference Range & Units 02/27/22 07:43 02/27/22 12:31 02/27/22 16:25 02/27/22 20:03 02/28/22 07:53  Glucose-Capillary 70 - 99 mg/dL 156 (H) 242 (H) 274 (H) 244 (H) 275 (H)   Review of Glycemic Control  Diabetes history: DM2 Outpatient Diabetes medications: Januvia 100 mg daily, Metformin 500 mg daily Current orders for Inpatient glycemic control: Novolog 0-9 units TID with meals, Novolog 0-5 units QHS; D5 1/2NS @ 10 ml/hr   Inpatient Diabetes Program Recommendations:     Insulin: Please consider ordering Semglee 5 units Q24H.   Thanks, Barnie Alderman, RN, MSN, [place] Diabetes Coordinator Inpatient Diabetes Program [phone] (Team Pager from 8am to [place])

## 2022-02-28 NOTE — Evaluation (Addendum)
Physical Therapy Evaluation Patient Details Name: Robin Mcclure MRN: [id number] DOB: April 04, 1923 Today's Date: 02/28/2022  History of Present Illness  Robin Mcclure is a 88 y.o. female with medical history significant of COPD, GERD, hypertension, hyperlipidemia, T2DM who presents to the emergency department accompanied by daughter due to 2-day onset of nausea, vomiting and difficulty in being able to tolerate oral intake.  Patient was on palliative care due to 2 episodes of life-threatening illnesses last year, daughter consulted with the palliative care nurse regarding patient's symptoms and it was advised for the patient to go to the ED for further evaluation and management.  Last bowel movement was yesterday (10/3 in the afternoon, she denies abdominal pain, chest pain, shortness of breath, fever, chills.   Clinical Impression  Patient demonstrates slightly labored movement for sitting up at bedside requiring hand held assist for elevating trunk and min assist for scooting to EOB. Patient able to transfer to chair with min assist/RW, requiring verbal cueing for hand placement on walker, and patient incontinent of stool during transfer. Patient O2 kept at 10 LPM due to SpO2 decreasing to 85% upon exertion, with SpO2 increasing to 90% at rest. Patient tolerated sitting up in chair after therapy. Patient will benefit from continued skilled physical therapy in hospital and recommended venue below to increase strength, balance, endurance for safe ADLs and gait.     Recommendations for follow up therapy are one component of a multi-disciplinary discharge planning process, led by the attending physician.  Recommendations may be updated based on patient status, additional functional criteria and insurance authorization.  Follow Up Recommendations Skilled nursing-short term rehab (<3 hours/day) Can patient physically be transported by private vehicle: Yes    Assistance Recommended at Discharge Set up  Supervision/Assistance  Patient can return home with the following  A little help with walking and/or transfers;A lot of help with bathing/dressing/bathroom;Assistance with cooking/housework;Help with stairs or ramp for entrance    Equipment Recommendations None recommended by PT  Recommendations for Other Services       Functional Status Assessment Patient has had a recent decline in their functional status and demonstrates the ability to make significant improvements in function in a reasonable and predictable amount of time.     Precautions / Restrictions Precautions Precautions: Fall Restrictions Weight Bearing Restrictions: No      Mobility  Bed Mobility Overal bed mobility: Needs Assistance Bed Mobility: Supine to Sit     Supine to sit: Min assist, HOB elevated     General bed mobility comments: handheld assist for elevating trunk and min assist for scooting to EOB    Transfers Overall transfer level: Needs assistance Equipment used: Rolling walker (2 wheels) Transfers: Sit to/from Stand, Bed to chair/wheelchair/BSC Sit to Stand: Min assist   Step pivot transfers: Min assist       General transfer comment: patient with slightly labored movement, increased time, cueing for hand placement on walker    Ambulation/Gait Ambulation/Gait assistance: Min assist Gait Distance (Feet): 5 Feet Assistive device: Rolling walker (2 wheels) Gait Pattern/deviations: Decreased step length - left, Decreased step length - right, Decreased stride length Gait velocity: decreased     General Gait Details: patient slightly unsteady on feet, able to take a few side steps to transfer to chair with RW and min assist  Stairs            Wheelchair Mobility    Modified Rankin (Stroke Patients Only)       Balance Overall balance assessment: Needs  assistance Sitting-balance support: Bilateral upper extremity supported, Feet supported Sitting balance-Leahy Scale:  Good Sitting balance - Comments: fair/good seated EOB   Standing balance support: Bilateral upper extremity supported, During functional activity, Reliant on assistive device for balance Standing balance-Leahy Scale: Fair Standing balance comment: fair with RW                             Pertinent Vitals/Pain Pain Assessment Pain Assessment: No/denies pain    Home Living Family/patient expects to be discharged to:: Private residence Living Arrangements: Children (lives with daughter) Available Help at Discharge: Family;Available 24 hours/day Type of Home: House Home Access: Ramped entrance       Home Layout: One level Home Equipment: Shower seat - built in;Grab bars - toilet;Grab bars - tub/shower;Rolling Walker (2 wheels);Cane - single point;Transport chair      Prior Function Prior Level of Function : Needs assist       Physical Assist : Mobility (physical);ADLs (physical) Mobility (physical): Bed mobility;Transfers;Gait;Stairs ADLs (physical): Grooming;Bathing;Toileting;IADLs;Dressing Mobility Comments: Patient is a household ambulator using RW ADLs Comments: Patient gets assistance from daughter for most ADLs and all iADLs     Hand Dominance   Dominant Hand: Right    Extremity/Trunk Assessment   Upper Extremity Assessment Upper Extremity Assessment: Generalized weakness    Lower Extremity Assessment Lower Extremity Assessment: Generalized weakness    Cervical / Trunk Assessment Cervical / Trunk Assessment: Kyphotic  Communication   Communication: HOH  Cognition Arousal/Alertness: Awake/alert Behavior During Therapy: WFL for tasks assessed/performed Overall Cognitive Status: Within Functional Limits for tasks assessed                                          General Comments      Exercises     Assessment/Plan    PT Assessment Patient needs continued PT services  PT Problem List Decreased activity tolerance;Decreased  balance;Decreased mobility;Decreased strength       PT Treatment Interventions DME instruction;Gait training;Balance training;Stair training;Patient/family education;Functional mobility training;Therapeutic activities;Therapeutic exercise    PT Goals (Current goals can be found in the Care Plan section)  Acute Rehab PT Goals Patient Stated Goal: return home with family PT Goal Formulation: With patient/family Time For Goal Achievement: 03/14/22 Potential to Achieve Goals: Good    Frequency Min 3X/week     Co-evaluation               AM-PAC PT "6 Clicks" Mobility  Outcome Measure Help needed turning from your back to your side while in a flat bed without using bedrails?: A Little Help needed moving from lying on your back to sitting on the side of a flat bed without using bedrails?: A Little Help needed moving to and from a bed to a chair (including a wheelchair)?: A Little Help needed standing up from a chair using your arms (e.g., wheelchair or bedside chair)?: A Little Help needed to walk in hospital room?: A Little Help needed climbing 3-5 steps with a railing? : A Lot 6 Click Score: 17    End of Session Equipment Utilized During Treatment: Oxygen (10 LPM via nasal cannula) Activity Tolerance: Patient tolerated treatment well;Patient limited by fatigue Patient left: in chair;with call bell/phone within reach;with family/visitor present Nurse Communication: Mobility status PT Visit Diagnosis: Unsteadiness on feet (R26.81);Other abnormalities of gait and mobility (R26.89);Muscle weakness (generalized) (  M62.81)    Time: 2429-9806 PT Time Calculation (min) (ACUTE ONLY): 27 min   Charges:   PT Evaluation $PT Eval Moderate Complexity: 1 Mod PT Treatments $Therapeutic Activity: 23-37 mins        Zigmund Gottron, SPT

## 2022-02-28 NOTE — NC FL2 (Signed)
[place] MEDICAID FL2 LEVEL OF CARE SCREENING TOOL     IDENTIFICATION  Patient Name: Robin Mcclure Birthdate: Apr 03, 1923 Sex: female Admission Date (Current Location): 02/21/2022  [facility] and [place] Number:  [employer] and Address:  [facility] [address], [place]      Provider Number: [id number]  Attending Physician Name and Address:  Roxan Hockey, MD  Relative Name and Phone Number:  Leota Sauers (Daughter)   [phone]    Current Level of Care: Hospital Recommended Level of Care: [place] Prior Approval Number:    Date Approved/Denied:   PASRR Number: [id number]  Discharge Plan: SNF    Current Diagnoses: Patient Active Problem List   Diagnosis Date Noted   Aspiration pneumonia with Hypoxic Resp Failure 02/25/2022   Acute respiratory failure with hypoxia due to Aspiration PNA 02/25/2022   Partial small bowel obstruction ([place]) 02/21/2022   Dehydration 02/21/2022   Hypoalbuminemia due to protein-calorie malnutrition ([place]) 02/21/2022   Uncontrolled type 2 diabetes mellitus with hyperglycemia ([place]) 02/21/2022   Mixed hyperlipidemia 02/21/2022   Right leg swelling 02/21/2022   Hearing loss 10/29/2021   Age-related osteoporosis without current pathological fracture 07/19/2021   Anxiety 07/19/2021   Arteriosclerosis of aorta (HCC) 07/19/2021   Atelectasis 07/19/2021   Atherosclerotic heart disease of native coronary artery without angina pectoris 07/19/2021   Chronic kidney disease, stage 3b ([place]) 07/19/2021   Chronic obstructive pulmonary disease ([place]) 07/19/2021   Diabetic renal disease ([place]) 07/19/2021   Hypertensive cardiovascular-renal disease, stage 1-4 or unspecified chronic kidney disease, without heart failure 07/19/2021   Hypertensive pulmonary arterial disease ([place]) 07/19/2021   Non-rheumatic mitral regurgitation 07/19/2021   Polyneuropathy due to type 2 diabetes mellitus ([place]) 07/19/2021    Other rheumatic multiple valve diseases 07/19/2021   Pure hypercholesterolemia 07/19/2021   Gastroesophageal reflux disease    Dementia without behavioral disturbance ([place])    Healthcare-associated pneumonia 08/18/2020   Diabetes ([place]) 08/18/2020   Essential hypertension 08/18/2020   Hypokalemia 08/18/2020   Pericarditis 06/20/2020   Pericardial effusion    Presbycusis of both ears 09/29/2018    Orientation RESPIRATION BLADDER Height & Weight     Self, Time, Place  O2 Continent, External catheter Weight: 134 lb 0.6 oz (60.8 kg) Height:  '5\' 5"'$  (165.1 cm)  BEHAVIORAL SYMPTOMS/MOOD NEUROLOGICAL BOWEL NUTRITION STATUS      Continent Diet (See D/C summary)  AMBULATORY STATUS COMMUNICATION OF NEEDS Skin   Extensive Assist Verbally Normal                       Personal Care Assistance Level of Assistance  Bathing, Feeding, Dressing Bathing Assistance: Limited assistance Feeding assistance: Independent Dressing Assistance: Limited assistance     Functional Limitations Info  Sight, Hearing, Speech Sight Info: Adequate Hearing Info: Impaired Speech Info: Adequate    SPECIAL CARE FACTORS FREQUENCY  PT (By licensed PT), OT (By licensed OT)     PT Frequency: 5 times weekly OT Frequency: 5 times weekly            Contractures Contractures Info: Not present    Additional Factors Info  Code Status, Allergies Code Status Info: FULL Allergies Info: NKA           Current Medications (02/28/2022):  This is the current hospital active medication list Current Facility-Administered Medications  Medication Dose Route Frequency Provider Last Rate Last Admin   0.9 %  sodium chloride infusion   Intravenous Continuous Emokpae, Courage,  MD 40 mL/hr at 02/28/22 1300 Infusion Verify at 02/28/22 1300   acetaminophen (TYLENOL) suppository 650 mg  650 mg Rectal Q4H PRN Roxan Hockey, MD       acetaminophen (TYLENOL) tablet 650 mg  650 mg Oral Q6H PRN Emokpae, Courage, MD   650  mg at 02/24/22 1603   albuterol (PROVENTIL) (2.5 MG/3ML) 0.083% nebulizer solution 2.5 mg  2.5 mg Nebulization Q2H PRN Emokpae, Courage, MD   2.5 mg at 02/28/22 1004   albuterol (VENTOLIN HFA) 108 (90 Base) MCG/ACT inhaler 1-2 puff  1-2 puff Inhalation Q6H PRN Adefeso, Oladapo, DO   2 puff at 02/23/22 2034   amoxicillin-clavulanate (AUGMENTIN) 500-125 MG per tablet 1 tablet  1 tablet Oral Q8H Adefeso, Oladapo, DO   1 tablet at 02/28/22 0528   bisacodyl (DULCOLAX) suppository 10 mg  10 mg Rectal QPM Emokpae, Courage, MD   10 mg at 02/27/22 1704   Chlorhexidine Gluconate Cloth 2 % PADS 6 each  6 each Topical Daily Emokpae, Courage, MD   6 each at 02/28/22 1230   dextromethorphan-guaiFENesin (MUCINEX DM) 30-600 MG per 12 hr tablet 1 tablet  1 tablet Oral BID Adefeso, Oladapo, DO   1 tablet at 02/28/22 0936   dextrose 5 %-0.45 % sodium chloride infusion   Intravenous Continuous Emokpae, Courage, MD   Stopped at 02/28/22 1120   enoxaparin (LOVENOX) injection 40 mg  40 mg Subcutaneous Q24H Emokpae, Courage, MD   40 mg at 02/28/22 0938   famotidine (PEPCID) IVPB 20 mg premix  20 mg Intravenous Q24H Emokpae, Courage, MD 100 mL/hr at 02/28/22 1300 Infusion Verify at 02/28/22 1300   fentaNYL (SUBLIMAZE) injection 25 mcg  25 mcg Intravenous Q2H PRN Emokpae, Courage, MD       hydrALAZINE (APRESOLINE) injection 10 mg  10 mg Intravenous Q6H PRN Emokpae, Courage, MD       insulin aspart (novoLOG) injection 0-5 Units  0-5 Units Subcutaneous QHS Adefeso, Oladapo, DO       insulin aspart (novoLOG) injection 0-9 Units  0-9 Units Subcutaneous TID WC Adefeso, Oladapo, DO   2 Units at 02/28/22 1122   mometasone-formoterol (DULERA) 200-5 MCG/ACT inhaler 2 puff  2 puff Inhalation BID Adefeso, Oladapo, DO   2 puff at 02/28/22 0812   NIFEdipine (PROCARDIA-XL/NIFEDICAL-XL) 24 hr tablet 90 mg  90 mg Oral q morning Adefeso, Oladapo, DO   90 mg at 02/28/22 0936   olopatadine (PATANOL) 0.1 % ophthalmic solution 1 drop  1 drop Both  Eyes BID Adefeso, Oladapo, DO   1 drop at 02/28/22 0938   ondansetron (ZOFRAN) injection 4 mg  4 mg Intravenous Q6H PRN Adefeso, Oladapo, DO   4 mg at 02/23/22 0605   Oral care mouth rinse  15 mL Mouth Rinse 4 times per day Roxan Hockey, MD   15 mL at 02/28/22 0824   Oral care mouth rinse  15 mL Mouth Rinse PRN Emokpae, Courage, MD       pravastatin (PRAVACHOL) tablet 20 mg  20 mg Oral q morning Adefeso, Oladapo, DO   20 mg at 02/28/22 0973   sodium bicarbonate tablet 650 mg  650 mg Oral TID Roxan Hockey, MD   650 mg at 02/28/22 5329     Discharge Medications: Please see discharge summary for a list of discharge medications.  Relevant Imaging Results:  Relevant Lab Results:   Additional Information SSN: [id number] [place]

## 2022-02-28 NOTE — Plan of Care (Signed)
  Problem: Acute Rehab PT Goals(only PT should resolve) Goal: Pt Will Go Supine/Side To Sit Outcome: Progressing Flowsheets (Taken 02/28/2022 1219) Pt will go Supine/Side to Sit: with min guard assist Goal: Patient Will Transfer Sit To/From Stand Outcome: Progressing Flowsheets (Taken 02/28/2022 1219) Patient will transfer sit to/from stand:  with min guard assist  with minimal assist Goal: Pt Will Transfer Bed To Chair/Chair To Bed Outcome: Progressing Flowsheets (Taken 02/28/2022 1219) Pt will Transfer Bed to Chair/Chair to Bed: min guard assist Goal: Pt Will Ambulate Outcome: Progressing Flowsheets (Taken 02/28/2022 1219) Pt will Ambulate:  with min guard assist  with supervision  with rolling walker  50 feet   Zigmund Gottron, SPT

## 2022-02-28 NOTE — TOC Initial Note (Signed)
Transition of Care [facility]) - Initial/Assessment Note    Patient Details  Name: Robin Mcclure MRN: [id number] Date of Birth: [birth year]  Transition of Care [facility]) CM/SW Contact:    Iona Beard, Ali Chuk Phone Number: 02/28/2022, 2:37 PM  Clinical Narrative:                 CSW updated that PT is recommending SNF for pt at D/C. CSW spoke with pt and daughter in room about this. They are agreeable to SNF referral. Preference is [facility], CSW inquired about pts COVID vaccination status, pt is not vaccinated. CSW explained that [facility] requires pts be fully vaccinated. Pt and and daughter understanding and agreeable to referral being sent out to both [facility] and [employer] [facility]. CSW completed Fl2 and sent to facilities. TOC to follow.   Expected Discharge Plan: Skilled Nursing Facility Barriers to Discharge: Continued Medical Work up   Patient Goals and CMS Choice Patient states their goals for this hospitalization and ongoing recovery are:: go to SNF CMS Medicare.gov Compare Post Acute Care list provided to:: Patient Represenative (must comment) Choice offered to / list presented to : Patient, Adult Children  Expected Discharge Plan and Services Expected Discharge Plan: [place] In-house Referral: Clinical Social Work Discharge Planning Services: CM Consult Post Acute Care Choice: [place] arrangements for the past 2 months: [place]                                      Prior Living Arrangements/Services Living arrangements for the past 2 months: Single Family Home Lives with:: Self Patient language and need for interpreter reviewed:: Yes Do you feel safe going back to the place where you live?: Yes      Need for Family Participation in Patient Care: Yes (Comment) Care giver support system in place?: Yes (comment)   Criminal Activity/Legal Involvement Pertinent to Current Situation/Hospitalization: No - Comment as  needed  Activities of Daily Living Home Assistive Devices/Equipment: Blood pressure cuff, Built-in shower seat, CBG Meter, Dentures (specify type), Eyeglasses, Hearing aid, Hospital bed, Oxygen, Raised toilet seat with rails, Walker (specify type), Wheelchair ADL Screening (condition at time of admission) Patient's cognitive ability adequate to safely complete daily activities?: Yes Is the patient deaf or have difficulty hearing?: Yes Does the patient have difficulty seeing, even when wearing glasses/contacts?: No Does the patient have difficulty concentrating, remembering, or making decisions?: No Patient able to express need for assistance with ADLs?: Yes Does the patient have difficulty dressing or bathing?: Yes Independently performs ADLs?: Yes (appropriate for developmental age) Does the patient have difficulty walking or climbing stairs?: Yes Weakness of Legs: None Weakness of Arms/Hands: None  Permission Sought/Granted                  Emotional Assessment Appearance:: Appears stated age Attitude/Demeanor/Rapport: Engaged Affect (typically observed): Accepting Orientation: : Oriented to Self, Oriented to Place, Oriented to  Time Alcohol / Substance Use: Not Applicable Psych Involvement: No (comment)  Admission diagnosis:  Small bowel obstruction (HCC) [K56.609] Partial small bowel obstruction ([place]) [K56.600] Patient Active Problem List   Diagnosis Date Noted   Aspiration pneumonia with Hypoxic Resp Failure 02/25/2022   Acute respiratory failure with hypoxia due to Aspiration PNA 02/25/2022   Partial small bowel obstruction ([place]) 02/21/2022   Dehydration 02/21/2022   Hypoalbuminemia due to protein-calorie malnutrition ([place]) 02/21/2022   Uncontrolled type 2  diabetes mellitus with hyperglycemia ([place]) 02/21/2022   Mixed hyperlipidemia 02/21/2022   Right leg swelling 02/21/2022   Hearing loss 10/29/2021   Age-related osteoporosis without current pathological fracture  07/19/2021   Anxiety 07/19/2021   Arteriosclerosis of aorta ([place]) 07/19/2021   Atelectasis 07/19/2021   Atherosclerotic heart disease of native coronary artery without angina pectoris 07/19/2021   Chronic kidney disease, stage 3b ([place]) 07/19/2021   Chronic obstructive pulmonary disease ([place]) 07/19/2021   Diabetic renal disease ([place]) 07/19/2021   Hypertensive cardiovascular-renal disease, stage 1-4 or unspecified chronic kidney disease, without heart failure 07/19/2021   Hypertensive pulmonary arterial disease ([place]) 07/19/2021   Non-rheumatic mitral regurgitation 07/19/2021   Polyneuropathy due to type 2 diabetes mellitus ([place]) 07/19/2021   Other rheumatic multiple valve diseases 07/19/2021   Pure hypercholesterolemia 07/19/2021   Gastroesophageal reflux disease    Dementia without behavioral disturbance ([place])    Healthcare-associated pneumonia 08/18/2020   Diabetes ([place]) 08/18/2020   Essential hypertension 08/18/2020   Hypokalemia 08/18/2020   Pericarditis 06/20/2020   Pericardial effusion    Presbycusis of both ears 09/29/2018   PCP:  Lujean Amel, MD Pharmacy:   [id number] [id number]: [phone]: [phone]    Social Determinants of Health (SDOH) Interventions Housing Interventions: Intervention Not Indicated  Readmission Risk Interventions    08/22/2020    1:40 PM  Readmission Risk Prevention Plan  Medication Screening Complete  Transportation Screening Complete

## 2022-03-01 ENCOUNTER — Inpatient Hospital Stay (HOSPITAL_COMMUNITY): Payer: MEDICARE

## 2022-03-01 DIAGNOSIS — Z515 Encounter for palliative care: Secondary | ICD-10-CM | POA: Diagnosis not present

## 2022-03-01 DIAGNOSIS — Z7189 Other specified counseling: Secondary | ICD-10-CM | POA: Diagnosis not present

## 2022-03-01 DIAGNOSIS — J9601 Acute respiratory failure with hypoxia: Secondary | ICD-10-CM | POA: Diagnosis not present

## 2022-03-01 LAB — BASIC METABOLIC PANEL
Anion gap: 6 (ref 5–15)
BUN: 21 mg/dL (ref 8–23)
CO2: 22 mmol/L (ref 22–32)
Calcium: 8.9 mg/dL (ref 8.9–10.3)
Chloride: 114 mmol/L — ABNORMAL HIGH (ref 98–111)
Creatinine, Ser: 0.7 mg/dL (ref 0.44–1.00)
GFR, Estimated: >60 mL/min (ref >60)
Glucose, Bld: 201 mg/dL — ABNORMAL HIGH (ref 70–99)
Potassium: 3.9 mmol/L (ref 3.5–5.1)
Sodium: 142 mmol/L (ref 135–145)

## 2022-03-01 LAB — CBC
HCT: 30.4 % — ABNORMAL LOW (ref 36.0–46.0)
Hemoglobin: 9.9 g/dL — ABNORMAL LOW (ref 12.0–15.0)
MCH: 31.3 pg (ref 26.0–34.0)
MCHC: 32.6 g/dL (ref 30.0–36.0)
MCV: 96.2 fL (ref 80.0–100.0)
Platelets: 205 10*3/uL (ref 150–400)
RBC: 3.16 MIL/uL — ABNORMAL LOW (ref 3.87–5.11)
RDW: 14.1 % (ref 11.5–15.5)
WBC: 10.1 10*3/uL (ref 4.0–10.5)
nRBC: 0 % (ref 0.0–0.2)

## 2022-03-01 LAB — GLUCOSE, CAPILLARY
Glucose-Capillary: 202 mg/dL — ABNORMAL HIGH (ref 70–99)
Glucose-Capillary: 199 mg/dL — ABNORMAL HIGH (ref 70–99)
Glucose-Capillary: 218 mg/dL — ABNORMAL HIGH (ref 70–99)
Glucose-Capillary: 200 mg/dL — ABNORMAL HIGH (ref 70–99)

## 2022-03-01 MED ORDER — IPRATROPIUM-ALBUTEROL 0.5-2.5 (3) MG/3ML IN SOLN
3.0000 mL | Freq: Four times a day (QID) | RESPIRATORY_TRACT | Status: DC
  Administered 2022-03-02: 3 mL via RESPIRATORY_TRACT
  Filled 2022-03-01: qty 3

## 2022-03-01 NOTE — TOC Progression Note (Signed)
Transition of Care [facility]) - Progression Note    Patient Details  Name: Robin Mcclure MRN: [id number] Date of Birth: 02-26-1923  Transition [facility]) CM/SW Contact  Shade Flood, LCSW Phone Number: 03/01/2022, 10:50 AM  Clinical Narrative:     Received call from [facility] updating that pt active with their outpatient palliative services. Updated MD and requested hospital Palliative consult.  Expected Discharge Plan: [place] Barriers to Discharge: Continued Medical Work up  Expected Discharge Plan and Services Expected Discharge Plan: [place] In-house Referral: Clinical Social Work Discharge Planning Services: CM Consult Post Acute Care Choice: [place] Living arrangements for the past 2 months: Single Family Home                                       Social Determinants of Health (SDOH) Interventions Housing Interventions: Intervention Not Indicated  Readmission Risk Interventions    08/22/2020    1:40 PM  Readmission Risk Prevention Plan  Medication Screening Complete  Transportation Screening Complete

## 2022-03-01 NOTE — Consult Note (Signed)
Consultation Note Date: 03/01/2022   Patient Name: Robin Mcclure  DOB: [birth year]  MRN: [id number]  Age / Sex: 88 y.o., female  PCP: Lujean Amel, MD Referring Physician: Deatra James, MD  Reason for Consultation: Establishing goals of care  HPI/Patient Profile: 88 y.o. female  with past medical history of COPD, HTN, HLD, diabetes, GERD admitted on 02/21/2022 with nausea/vomiting with ileus vs SBO now resolved. Hospitalization complicated by aspiration pneumonia, ongoing high oxygen needs and shortness of breath, poor intake and dysphagia.   Clinical Assessment and Goals of Care: I met today with Robin Mcclure and daughter, Robin Mcclure, at bedside. I had previously reviewed records, notes, diagnostics, Living Will, and discussed with RN and SLP. Ms. Krienke was initially sleeping but awoke during my visit. I spoke with both she and Robin Mcclure about her health progression and decline while hospitalized. We reviewed concerns with lack of progress on ongoing shortness of breath and poor intake. Ms. Mittelman shares "we can't live forever." I discussed with her more about her feelings facing her mortality and her fears. She tells me that she is not fearful and has no concerns. I discussed with her code status and poor outcomes if she were to require intubation or resuscitation and that I fear this would only cause her more suffering at the end of her life. I explained that without resuscitation and if she were to decline further we would provide her relief of pain and suffering with medications. I reassured her that we would take care of her. I asked her thoughts and she maintains full code but continues to express that she has no concerns if she were to die.   Ms. Paules fell back asleep while I spoke with Robin Mcclure. Robin Mcclure shares that her mother has never spoken of her death or even her wishes for arrangements or final resting place.  As much as Robin Mcclure is hopeful that her mother will have some improvement she is realistic in understanding the barriers moving forward and high risk of further decline. We discussed SNF but she is requiring too much oxygen and too short of breath to rehab. If this does not improve her options may be limited. We discussed hospice placement. Robin Mcclure understands the repercussions of resuscitation and would consider DNR if her mother declined further. At this time we will honor Ms. Liverman's wishes and will continue with full code and watchful waiting. Time for outcomes.   All questions/concerns addressed. Emotional support provided. Discussed with RN and Dr. Roger Shelter independently.   Primary Decision Maker PATIENT    SUMMARY OF RECOMMENDATIONS   - Recommended DNR status - Watchful waiting - she would be eligible for hospice if no improvement or with further decline  Code Status/Advance Care Planning: Full code - continue for now   Symptom Management:  Per attending.   Palliative Prophylaxis:  Aspiration, Bowel Regimen, Delirium Protocol, Frequent Pain Assessment, Oral Care, and Turn Reposition  Additional Recommendations (Limitations, Scope, Preferences): Full Scope Treatment  Psycho-social/Spiritual:  Desire for further Chaplaincy support:yes Additional Recommendations: Education  on Hospice and Grief/Bereavement Support  Prognosis:  Prognosis poor with significant health decline with multiple acute complications and advanced age.   Discharge Planning: To Be Determined      Primary Diagnoses: Present on Admission:  Partial small bowel obstruction (HCC)  Gastroesophageal reflux disease  Essential hypertension  Chronic obstructive pulmonary disease (HCC)  Aspiration pneumonia with Hypoxic Resp Failure  Acute respiratory failure with hypoxia due to Aspiration PNA   I have reviewed the medical record, interviewed the patient and family, and examined the patient. The following  aspects are pertinent.  Past Medical History:  Diagnosis Date   Diabetes mellitus without complication ([place])    Hyperlipidemia    Hypertension    Social History   Socioeconomic History   Marital status: Widowed    Spouse name: Not on file   Number of children: Not on file   Years of education: Not on file   Highest education level: Not on file  Occupational History   Not on file  Tobacco Use   Smoking status: Never   Smokeless tobacco: Never  Substance and Sexual Activity   Alcohol use: No   Drug use: No   Sexual activity: Not on file  Other Topics Concern   Not on file  Social History Narrative   Not on file   Social Determinants of Health   Financial Resource Strain: Not on file  Food Insecurity: No Food Insecurity (02/22/2022)   Hunger Vital Sign    Worried About Running Out of Food in the Last Year: Never true    Ran Out of Food in the Last Year: Never true  Transportation Needs: No Transportation Needs (02/22/2022)   PRAPARE - Hydrologist (Medical): No    Lack of Transportation (Non-Medical): No  Physical Activity: Not on file  Stress: Not on file  Social Connections: Not on file   History reviewed. No pertinent family history. Scheduled Meds:  amoxicillin-clavulanate  1 tablet Oral Q8H   bisacodyl  10 mg Rectal QPM   Chlorhexidine Gluconate Cloth  6 each Topical Daily   dextromethorphan-guaiFENesin  1 tablet Oral BID   enoxaparin (LOVENOX) injection  40 mg Subcutaneous Q24H   insulin aspart  0-5 Units Subcutaneous QHS   insulin aspart  0-9 Units Subcutaneous TID WC   mometasone-formoterol  2 puff Inhalation BID   NIFEdipine  90 mg Oral q morning   olopatadine  1 drop Both Eyes BID   mouth rinse  15 mL Mouth Rinse 4 times per day   pravastatin  20 mg Oral q morning   sodium bicarbonate  650 mg Oral TID   Continuous Infusions:  dextrose 5 % and 0.45% NaCl Stopped (02/28/22 1120)   famotidine (PEPCID) IV Stopped (02/28/22  1823)   PRN Meds:.acetaminophen, acetaminophen, albuterol, albuterol, fentaNYL (SUBLIMAZE) injection, hydrALAZINE, ondansetron (ZOFRAN) IV, mouth rinse No Known Allergies Review of Systems  Constitutional:  Positive for activity change, appetite change and fatigue.  HENT:  Positive for trouble swallowing.   Respiratory:  Positive for shortness of breath.   Gastrointestinal:  Negative for nausea and vomiting.  Neurological:  Positive for weakness.    Physical Exam Vitals and nursing note reviewed.  Constitutional:      Appearance: She is ill-appearing.  Cardiovascular:     Rate and Rhythm: Tachycardia present.  Pulmonary:     Effort: Tachypnea present.     Comments: Shortness of breath at rest Abdominal:     Palpations: Abdomen is  soft.  Neurological:     Mental Status: She is alert.     Comments: Some confusion at times but overall understanding of her status     Vital Signs: BP (!) 142/89   Pulse (!) 104   Temp 99 F (37.2 C) (Axillary)   Resp (!) 28   Ht '5\' 5"'  (1.651 m)   Wt 60.8 kg   SpO2 91%   BMI 22.31 kg/m  Pain Scale: 0-10   Pain Score: 0-No pain   SpO2: SpO2: 91 % O2 Device:SpO2: 91 % O2 Flow Rate: .O2 Flow Rate (L/min): 8 L/min  IO: Intake/output summary:  Intake/Output Summary (Last 24 hours) at 03/01/2022 1358 Last data filed at 03/01/2022 1300 Gross per 24 hour  Intake 944.33 ml  Output 750 ml  Net 194.33 ml    LBM: Last BM Date : 02/28/22 Baseline Weight: Weight: 63.5 kg Most recent weight: Weight: 60.8 kg     Palliative Assessment/Data:     Time In: 1400  Time Total: 75 min  Greater than 50%  of this time was spent counseling and coordinating care related to the above assessment and plan.  Signed by: Vinie Sill, NP Palliative Medicine Team Pager # [phone] (M-F 8a-5p) Team Phone # [phone] (Nights/Weekends)

## 2022-03-01 NOTE — Progress Notes (Signed)
PROGRESS NOTE     Robin Mcclure, is a 88 y.o. female, DOB - 1923/03/19, MLY:[id number]  Admit date - 02/21/2022   Admitting Physician Bernadette Hoit, DO  Outpatient Primary MD for the patient is [name], [name], MD  LOS - 8  Chief Complaint  Patient presents with   Emesis        Brief Narrative:  Robin Mcclure is a  88 y.o. female with medical history significant of COPD, GERD, hypertension, hyperlipidemia, T2DM admitted on 02/21/2022 with intractable emesis and found to have partial small bowel obstruction  Versus ileus -Unfortunately around 02/24/2022 patient had an aspiration episode  -Assessment and Plan:  1)PSBO Vs Ileus--- abdominal and chest x-ray from 02/23/22 suggested possible abnormalities of the chest x-ray with regards to vascular structures of the chest--radiologist recommended CT with contrast -CT abdomen and pelvis and CT chest with contrast on 02/23/2022 showed no significant vascular abnormalities of the chest---Shows resolving small bowel obstruction versus ileus--with contrast in the colon -Previously discussed with general surgeon Dr. Constance Haw... She reviewed the images -Conservative management advised possible aspiration episode on 02/24/2022-- --Dulcolax suppository -IV antiemetics as needed --Clinically and radiologically SBO appears to be resolved -Patient had BM, passing gas -NG tube discontinued on 02/26/2022 -Repeat x-rays -resolution of SBO has been recognized  2)Dysphagia--- swallowing difficulties persist -Speech pathologist recommends pured solids and nectar thickened liquids -02/28/22 -Oral intake remains challenging--- concerns for aspiration risk  3)Acute hypoxic respiratory failure due to aspiration pneumonia -in the early hours of 02/24/2022 patient developed significant respiratory distress with hypoxia O2 sats down to the 60s -Required deep suctioning, bronchodilators, high flow oxygen Arterial Blood Gas result:  pO2 44; pCO2 48; pH 7.3;   HCO3 23.6, %O2 Sat 76.8 on 8 L of [place] -Currently requiring up to 10 L of oxygen via nasal cannula C/n  Augmentin started on 02/24/22--for aspiration pneumonia -Continue mucolytics, bronchodilators and supplemental oxygen - speech evaluation appreciated, dysphagia 1/pured diet recommended with thin liquids -NG tube removed on 02/26/2022 -- Patient to remain in stepdown -continue BiPAP alternating with high flow oxygen -Currently on high flow oxygen satting 90% -Serial chest x-rays  suggest bilateral infiltrates/aspiration -Leukocytosis persist  -Repeat chest x-ray 03/01/2022 IMPRESSION: 1. No change in aeration to the lungs compared with previous exam. 2. Persistent bilateral pleural effusions, left greater than right   4)HTN- Hold nifedipine  may use IV Hydralazine 10 mg  Every 4 hours Prn for systolic blood pressure over 160 mmhg  5)HyperNatremia/Hyperchloremia with non-anion gap metabolic Acidosis--- due to free water deficit/dehydration in the setting of small bowel obstruction on n.p.o. status -Patient received IV fluids and bicarb infusion prior to repeat contrast study on 02/23/2022 since patient already had a contrast study on 02/21/2022 02/28/22 -Sodium and chloride ihas normalized -Metabolic acidosis improving -Continue gentle hydration until oral intake improves  6)Hypophosphatemia/hypokalemia and hypomagnesemia----risk of refeeding syndrome -Potassium and magnesium is normalized -Phosphorus has improved -Replace  and recheck  7)DM2-A1c 6.4 reflecting excellent diabetic control PTA -Hold Januvia and metformin 02/28/22 -Hyperglycemia noted most likely due to dextrose infusion IV -Change IV fluids to normal saline -If hyperglycemia persists consider adding Lantus 5 units nightly Use Novolog/Humalog Sliding scale insulin with Accu-Cheks/Fingersticks as ordered   8)Social/ethics--- plan of care and advanced directive discussed with patient and patient's daughter [name] -Patient is a full code -Okay for BiPAP as needed -Palliative care consulted  9) generalized weakness and deconditioning/ambulatory dysfunction--- get physical therapy eval  Prophylaxis:- -Lovenox for DVT prophylaxis and Pepcid for GI  prophylaxis  Disposition/Need for in-Hospital Stay- patient unable to be discharged at this time due to - -acute hypoxic respiratory failure due to aspiration pneumonia requiring high flow oxygen -Currently requiring up to 10 L of oxygen  -Becomes dyspneic with positional change   Status is: Inpatient   Disposition: The patient is from: Home              Anticipated d/c is to: SNF              Anticipated d/c date is: > 3 days              Patient currently is not medically stable to d/c. Barriers: Not Clinically Stable-   Code Status :  -  Code Status: Full Code   Family Communication:    patient's daughter [name] is primary contact  DVT Prophylaxis  :   - SCDs   enoxaparin (LOVENOX) injection 40 mg Start: 02/25/22 1000 SCDs Start: 02/21/22 2316   Lab Results  Component Value Date   PLT 205 03/01/2022   Inpatient Medications  Scheduled Meds:  amoxicillin-clavulanate  1 tablet Oral Q8H   bisacodyl  10 mg Rectal QPM   Chlorhexidine Gluconate Cloth  6 each Topical Daily   dextromethorphan-guaiFENesin  1 tablet Oral BID   enoxaparin (LOVENOX) injection  40 mg Subcutaneous Q24H   insulin aspart  0-5 Units Subcutaneous QHS   insulin aspart  0-9 Units Subcutaneous TID WC   mometasone-formoterol  2 puff Inhalation BID   NIFEdipine  90 mg Oral q morning   olopatadine  1 drop Both Eyes BID   mouth rinse  15 mL Mouth Rinse 4 times per day   pravastatin  20 mg Oral q morning   sodium bicarbonate  650 mg Oral TID   Continuous Infusions:  sodium chloride 40 mL/hr at 03/01/22 1000   dextrose 5 % and 0.45% NaCl Stopped (02/28/22 1120)   famotidine (PEPCID) IV Stopped (02/28/22 1823)   PRN Meds:.acetaminophen, acetaminophen,  albuterol, albuterol, fentaNYL (SUBLIMAZE) injection, hydrALAZINE, ondansetron (ZOFRAN) IV, mouth rinse   Anti-infectives (From admission, onward)    Start     Dose/Rate Route Frequency Ordered Stop   02/26/22 2200  amoxicillin-clavulanate (AUGMENTIN) 500-125 MG per tablet 1 tablet        1 tablet Oral Every 8 hours 02/26/22 2053     02/26/22 1000  amoxicillin-clavulanate (AUGMENTIN) 200-28.5 MG/5ML suspension 500 mg  Status:  Discontinued        500 mg of amoxicillin Per Tube 3 times daily 02/26/22 0815 02/26/22 2053   02/24/22 1530  amoxicillin-clavulanate (AUGMENTIN) 200-28.5 MG/5ML suspension 876 mg  Status:  Discontinued        875 mg of amoxicillin Per Tube Every 12 hours 02/24/22 1430 02/26/22 0815   02/24/22 1515  amoxicillin-clavulanate (AUGMENTIN) 400-57 MG/5ML suspension 875 mg  Status:  Discontinued        875 mg Per Tube Every 12 hours 02/24/22 1418 02/24/22 1430       Subjective: Robin Mcclure today has no fevers, no further emesis,  No chest pain,   - 02/28/22 -Oral intake remains challenging--- concerns for aspiration risk RN Brittney at bedside -Cough persist -Becomes dyspneic with positional change requiring high flow oxygen    Objective: Vitals:   03/01/22 0812 03/01/22 0900 03/01/22 0946 03/01/22 1000  BP:  101/62 101/62 131/86  Pulse:  80  (!) 108  Resp:  18  20  Temp:      TempSrc:  SpO2: 99% 91%  90%  Weight:      Height:        Intake/Output Summary (Last 24 hours) at 03/01/2022 1052 Last data filed at 03/01/2022 1000 Gross per 24 hour  Intake 1199.31 ml  Output 750 ml  Net 449.31 ml   Filed Weights   02/25/22 0522 02/26/22 0500 02/27/22 0420  Weight: 58.8 kg 58.8 kg 60.8 kg      Physical Exam:   General:  AAO x 2, confused but awake alert Generalized cachexia-unable to maintain meaningful conversation  HEENT:  Normocephalic, PERRL, otherwise with in Normal limits   Neuro:  CNII-XII intact. , normal motor and sensation, reflexes  intact   Lungs:   Clear to auscultation BL, Respirations unlabored,  No wheezes / crackles  Cardio:    S1/S2, RRR, No murmure, No Rubs or Gallops   Abdomen:  Soft, non-tender, bowel sounds active all four quadrants, no guarding or peritoneal signs.  Muscular  skeletal:  Limited exam -sever global generalized weaknesses - in bed, able to move all 4 extremities,   2+ pulses,  symmetric, +2 pitting edema  Skin:  Dry, warm to touch, negative for any Rashes,  Wounds: Please see nursing documentation          Data Reviewed: I have personally reviewed following labs and imaging studies  CBC: Recent Labs  Lab 02/23/22 0422 02/25/22 0502 02/26/22 0351 02/27/22 0434 03/01/22 0420  WBC 5.9 13.6* 11.9* 12.8* 10.1  HGB 11.1* 11.7* 11.6* 10.9* 9.9*  HCT 35.1* 36.8 35.8* 32.9* 30.4*  MCV 98.9 98.1 96.5 95.4 96.2  PLT 228 234 253 238 683   Basic Metabolic Panel: Recent Labs  Lab 02/25/22 0502 02/26/22 0351 02/27/22 0434 02/28/22 0453 03/01/22 0420  NA 143 144 142 139 142  K 3.8 3.5 4.0 4.0 3.9  CL 114* 114* 114* 111 114*  CO2 21* 20* 19* 21* 22  GLUCOSE 151* 126* 205* 269* 201*  BUN 24* 26* 30* 26* 21  CREATININE 0.84 0.83 0.83 0.70 0.70  CALCIUM 9.2 9.3 9.2 8.8* 8.9  MG  --   --  1.7  --   --   PHOS  --   --  1.3* 2.4*  --    GFR: Estimated Creatinine Clearance: 36.2 mL/min (by C-G formula based on SCr of 0.7 mg/dL). Liver Function Tests: Recent Labs  Lab 02/27/22 0434 02/28/22 0453  ALBUMIN 2.6* 2.5*   HbA1C: No results for input(s): "HGBA1C" in the last 72 hours.  Radiology Studies: DG CHEST PORT 1 VIEW  Result Date: 03/01/2022 CLINICAL DATA:  Shortness of breath. EXAM: PORTABLE CHEST 1 VIEW COMPARISON:  02/27/2022 FINDINGS: Stable cardiomediastinal contours. Bilateral pleural effusions are noted, left greater than right. Persistent complete retrocardiac opacification of the left lung base. Again noted are patchy bilateral upper lobe and right lung base  opacities. These are unchanged compared with the previous exam. IMPRESSION: 1. No change in aeration to the lungs compared with previous exam. 2. Persistent bilateral pleural effusions, left greater than right. Electronically Signed   By: Kerby Moors M.D.   On: 03/01/2022 08:22   DG ABD ACUTE 2+V W 1V CHEST  Result Date: 02/28/2022 CLINICAL DATA:  Shortness of breath. EXAM: DG ABDOMEN ACUTE WITH 1 VIEW CHEST COMPARISON:  Radiographs 02/27/2022 FINDINGS: Progressive pulmonary infiltrates and probable small bilateral pleural effusions. Contrast noted throughout the colon from a recent swallowing function test. No findings for small bowel obstruction or free air. IMPRESSION: 1. Progressive pulmonary infiltrates  and probable small bilateral pleural effusions. 2. No acute abdominal findings. Electronically Signed   By: Marijo Sanes M.D.   On: 02/28/2022 14:36   DG SWALLOW FUNC SPEECH PATH  Result Date: 02/27/2022 Table formatting from the original result was not included. Objective Swallowing Evaluation: Type of Study: MBS-Modified Barium Swallow Study  Patient Details Name: Robin Mcclure MRN: [id number] Date of Birth: December 17, 1922 Today's Date: 02/27/2022 Time: SLP Start Time (ACUTE ONLY): 1400 -SLP Stop Time (ACUTE ONLY): 1914 SLP Time Calculation (min) (ACUTE ONLY): 33 min Past Medical History: Past Medical History: Diagnosis Date  Diabetes mellitus without complication ([place])   Hyperlipidemia   Hypertension  Past Surgical History: Past Surgical History: Procedure Laterality Date  ABDOMINAL HYSTERECTOMY    BIOPSY  01/19/2018  Procedure: BIOPSY;  Surgeon: Ronnette Juniper, MD;  Location: [facility] ENDOSCOPY;  Service: Gastroenterology;;  CHOLECYSTECTOMY    ESOPHAGOGASTRODUODENOSCOPY (EGD) WITH PROPOFOL N/A 01/19/2018  Procedure: ESOPHAGOGASTRODUODENOSCOPY (EGD) WITH PROPOFOL;  Surgeon: Ronnette Juniper, MD;  Location: [place];  Service: Gastroenterology;  Laterality: N/A;  HIP SURGERY   HPI: 88 y.o. female with medical history  significant of COPD, GERD, hypertension, hyperlipidemia, T2DM admitted on 02/21/2022 with intractable emesis and found to have partial small bowel obstruction  Versus ileus. in the early hours of 02/24/2022 patient developed significant respiratory distress with hypoxia O2 sats down to the 60s  -Required deep suctioning, bronchodilators, high flow oxygen  Arterial Blood Gas result:  pO2 44; pCO2 48; pH 7.3;  HCO3 23.6, %O2 Sat 76.8 on 8 L of [place]  -Currently requiring up to 11 L of oxygen via nasal cannula. BSE requested.  Subjective: "I am getting so tired."  Recommendations for follow up therapy are one component of a multi-disciplinary discharge planning process, led by the attending physician.  Recommendations may be updated based on patient status, additional functional criteria and insurance authorization. Assessment / Plan / Recommendation   02/27/2022   4:00 PM Clinical Impressions Clinical Impression Pt presents with mi/mod oropharyngeal and suspected primary esophageal dysphagia characterized by weak lingual manipulation and prolonged oral transit, swallow trigger at the level of the valleculae, decreased tongue base retraction and epiglottic deflection which appeared to be negatively impacted by elongated uvulae that nearly meat the tip of the epiglottis and impaired epiglottic deflection and approximation with posterior pharyngeal wall resulting in reduced laryngeal vestibule closure and penetration of thins during the swallow and penetration/aspiration to the vocal folds (difficult to see if below vocal folds at times) in trace amounts and resulting vallecular residue and occasional contrast behind the uvulae. Epiglottis deflected greater with puree. Nectars only spilled over into the laryngeal vestibule in trace amounts, but also resulted in vallecular residue. Esophageal sweep revealed extensive barium contrast diffusely throughout the esophagus that resulted in some backflow/retrograde movement to the  cervical esophagus. Pt still currently requires high supplemental oxygen and fatigues quickly, which increases risk for aspiration and decreases her ability to meet nutritional needs. Pt's daughter indicates that Pt was on nectar thickened liquids about 1-2 years ago and "did not like it". SLP reviewed imaging with Pt's daughter and suggested that we change her to NTL for now, with plan to advance her once she is clinically stronger. Daughter is willing to try this. Question whether Pt's appearance of elongated uvula could be due to some swelling from recent NG placement? Recommend D1/puree and NTL via cup/straw with pacing strategies due to shortness of breath and fatigue and ensure that Pt is sitting upright for all eating/drinking and remains  up for at least 30 minutes after meals due to esophageal dsymotility. SLP will follow during acute stay. SLP Visit Diagnosis Dysphagia, pharyngoesophageal phase (R13.14);Dysphagia, oropharyngeal phase (R13.12) Impact on safety and function Mild aspiration risk;Risk for inadequate nutrition/hydration     02/27/2022   4:00 PM Treatment Recommendations Treatment Recommendations Therapy as outlined in treatment plan below     02/27/2022   4:00 PM Prognosis Prognosis for Safe Diet Advancement Good   02/27/2022   4:00 PM Diet Recommendations SLP Diet Recommendations Dysphagia 1 (Puree) solids;Nectar thick liquid Liquid Administration via Cup;Straw Medication Administration Whole meds with puree Compensations Slow rate;Small sips/bites;Multiple dry swallows after each bite/sip Postural Changes Remain semi-upright after after feeds/meals (Comment);Seated upright at 90 degrees     02/27/2022   4:00 PM Other Recommendations Recommended Consults Consider esophageal assessment Oral Care Recommendations Oral care BID;Staff/trained caregiver to provide oral care Other Recommendations Clarify dietary restrictions;Order thickener from pharmacy;Prohibited food (jello, ice cream, thin soups)  Follow Up Recommendations Skilled nursing-short term rehab (<3 hours/day) Assistance recommended at discharge Frequent or constant Supervision/Assistance Functional Status Assessment Patient has had a recent decline in their functional status and demonstrates the ability to make significant improvements in function in a reasonable and predictable amount of time.   02/27/2022   4:00 PM Frequency and Duration  Speech Therapy Frequency (ACUTE ONLY) min 2x/week Treatment Duration 1 week     02/27/2022   4:00 PM Oral Phase Oral Phase Impaired Oral - Mech Soft Weak lingual manipulation;Delayed oral transit;Piecemeal swallowing    02/27/2022   4:00 PM Pharyngeal Phase Pharyngeal Phase Impaired Pharyngeal- Nectar Straw Delayed swallow initiation-vallecula;Reduced epiglottic inversion;Penetration/Aspiration during swallow;Pharyngeal residue - valleculae Pharyngeal Material enters airway, remains ABOVE vocal cords then ejected out Pharyngeal- Thin Teaspoon Delayed swallow initiation-vallecula;Reduced epiglottic inversion;Reduced tongue base retraction;Penetration/Aspiration during swallow;Penetration/Apiration after swallow;Pharyngeal residue - valleculae Pharyngeal Material enters airway, CONTACTS cords and not ejected out;Material enters airway, remains ABOVE vocal cords and not ejected out Pharyngeal- Thin Cup Delayed swallow initiation-vallecula;Reduced epiglottic inversion;Reduced airway/laryngeal closure;Reduced tongue base retraction;Penetration/Aspiration during swallow;Penetration/Apiration after swallow;Trace aspiration;Pharyngeal residue - valleculae Pharyngeal Material enters airway, CONTACTS cords and not ejected out Pharyngeal- Thin Straw Delayed swallow initiation-vallecula;Delayed swallow initiation-pyriform sinuses;Reduced tongue base retraction;Reduced epiglottic inversion;Penetration/Aspiration during swallow;Pharyngeal residue - valleculae Pharyngeal Material enters airway, remains ABOVE vocal cords and  not ejected out Pharyngeal- Puree Delayed swallow initiation-vallecula;Reduced tongue base retraction;Reduced epiglottic inversion;Pharyngeal residue - valleculae Pharyngeal- Mechanical Soft Reduced epiglottic inversion;Reduced tongue base retraction;Delayed swallow initiation-vallecula;Pharyngeal residue - valleculae Pharyngeal- Pill WFL;Delayed swallow initiation-vallecula    02/27/2022   4:00 PM Cervical Esophageal Phase  Cervical Esophageal Phase Impaired Puree Esophageal backflow into cervical esophagus Thank you, Genene Churn, CCC-SLP [phone] PORTER,DABNEY 02/27/2022, 5:27 PM                      Scheduled Meds:  amoxicillin-clavulanate  1 tablet Oral Q8H   bisacodyl  10 mg Rectal QPM   Chlorhexidine Gluconate Cloth  6 each Topical Daily   dextromethorphan-guaiFENesin  1 tablet Oral BID   enoxaparin (LOVENOX) injection  40 mg Subcutaneous Q24H   insulin aspart  0-5 Units Subcutaneous QHS   insulin aspart  0-9 Units Subcutaneous TID WC   mometasone-formoterol  2 puff Inhalation BID   NIFEdipine  90 mg Oral q morning   olopatadine  1 drop Both Eyes BID   mouth rinse  15 mL Mouth Rinse 4 times per day   pravastatin  20 mg Oral q morning   sodium bicarbonate  650 mg Oral TID   Continuous Infusions:  sodium chloride 40 mL/hr at 03/01/22 1000   dextrose 5 % and 0.45% NaCl Stopped (02/28/22 1120)   famotidine (PEPCID) IV Stopped (02/28/22 1823)    LOS: 8 days   Deatra James M.D on 03/01/2022 at 10:52 AM  Go to [email or web address] - for contact info  [facility] - Office  [phone]  If 7PM-7AM, please contact night-coverage [email or web address] 03/01/2022, 10:52 AM

## 2022-03-02 DIAGNOSIS — J9601 Acute respiratory failure with hypoxia: Secondary | ICD-10-CM | POA: Diagnosis not present

## 2022-03-02 LAB — BASIC METABOLIC PANEL
Anion gap: 6 (ref 5–15)
BUN: 21 mg/dL (ref 8–23)
CO2: 23 mmol/L (ref 22–32)
Calcium: 9.3 mg/dL (ref 8.9–10.3)
Chloride: 116 mmol/L — ABNORMAL HIGH (ref 98–111)
Creatinine, Ser: 0.61 mg/dL (ref 0.44–1.00)
GFR, Estimated: >60 mL/min (ref >60)
Glucose, Bld: 215 mg/dL — ABNORMAL HIGH (ref 70–99)
Potassium: 3.8 mmol/L (ref 3.5–5.1)
Sodium: 145 mmol/L (ref 135–145)

## 2022-03-02 LAB — CBC
HCT: 31.9 % — ABNORMAL LOW (ref 36.0–46.0)
Hemoglobin: 10.3 g/dL — ABNORMAL LOW (ref 12.0–15.0)
MCH: 31.9 pg (ref 26.0–34.0)
MCHC: 32.3 g/dL (ref 30.0–36.0)
MCV: 98.8 fL (ref 80.0–100.0)
Platelets: 222 10*3/uL (ref 150–400)
RBC: 3.23 MIL/uL — ABNORMAL LOW (ref 3.87–5.11)
RDW: 14.5 % (ref 11.5–15.5)
WBC: 13.4 10*3/uL — ABNORMAL HIGH (ref 4.0–10.5)
nRBC: 0 % (ref 0.0–0.2)

## 2022-03-02 LAB — GLUCOSE, CAPILLARY
Glucose-Capillary: 218 mg/dL — ABNORMAL HIGH (ref 70–99)
Glucose-Capillary: 222 mg/dL — ABNORMAL HIGH (ref 70–99)
Glucose-Capillary: 199 mg/dL — ABNORMAL HIGH (ref 70–99)
Glucose-Capillary: 142 mg/dL — ABNORMAL HIGH (ref 70–99)

## 2022-03-02 MED ORDER — IPRATROPIUM-ALBUTEROL 0.5-2.5 (3) MG/3ML IN SOLN
3.0000 mL | Freq: Three times a day (TID) | RESPIRATORY_TRACT | Status: DC
  Administered 2022-03-02 – 2022-03-05 (×10): 3 mL via RESPIRATORY_TRACT
  Filled 2022-03-02 (×10): qty 3

## 2022-03-02 MED ORDER — SENNA 8.6 MG PO TABS
1.0000 | ORAL_TABLET | Freq: Every day | ORAL | Status: DC
  Administered 2022-03-02 – 2022-03-05 (×4): 8.6 mg via ORAL
  Filled 2022-03-02 (×4): qty 1

## 2022-03-02 MED ORDER — FAMOTIDINE 20 MG PO TABS
20.0000 mg | ORAL_TABLET | Freq: Every day | ORAL | Status: DC
  Administered 2022-03-02 – 2022-03-05 (×4): 20 mg via ORAL
  Filled 2022-03-02 (×4): qty 1

## 2022-03-02 MED ORDER — FLORANEX PO PACK
1.0000 g | PACK | Freq: Three times a day (TID) | ORAL | Status: DC
  Administered 2022-03-02 – 2022-03-05 (×11): 1 g via ORAL
  Filled 2022-03-02 (×17): qty 1

## 2022-03-02 MED ORDER — FUROSEMIDE 10 MG/ML IJ SOLN
40.0000 mg | Freq: Once | INTRAMUSCULAR | Status: AC
  Administered 2022-03-02: 40 mg via INTRAVENOUS
  Filled 2022-03-02: qty 4

## 2022-03-02 MED ORDER — DEXTROSE 5 % IV SOLN: INTRAVENOUS | Status: DC

## 2022-03-02 NOTE — Progress Notes (Signed)
Physical Therapy Treatment Patient Details Name: Robin Mcclure MRN: [id number] DOB: 10-30-1922 Today's Date: 03/02/2022   History of Present Illness Robin Mcclure is a 88 y.o. female with medical history significant of COPD, GERD, hypertension, hyperlipidemia, T2DM who presents to the emergency department accompanied by daughter due to 2-day onset of nausea, vomiting and difficulty in being able to tolerate oral intake.  Patient was on palliative care due to 2 episodes of life-threatening illnesses last year, daughter consulted with the palliative care nurse regarding patient's symptoms and it was advised for the patient to go to the ED for further evaluation and management.  Last bowel movement was yesterday (10/3 in the afternoon, she denies abdominal pain, chest pain, shortness of breath, fever, chills.    PT Comments    Patient demonstrates labored movement for sitting up at bedside. Patient unable to complete all LE exercises at EOB due to c/o fatigue and weakness. Patient slightly unsteady on feet, able to transfer to bedside commode and then to chair with mod assist. Patient kept on 9 LPM O2 due to SpO2 averaging 90% with exertion. Patient tolerated sitting up in chair after therapy. Patient will benefit from continued skilled physical therapy in hospital and recommended venue below to increase strength, balance, endurance for safe ADLs and gait.   Recommendations for follow up therapy are one component of a multi-disciplinary discharge planning process, led by the attending physician.  Recommendations may be updated based on patient status, additional functional criteria and insurance authorization.  Follow Up Recommendations  Skilled nursing-short term rehab (<3 hours/day) Can patient physically be transported by private vehicle: No   Assistance Recommended at Discharge Intermittent Supervision/Assistance  Patient can return home with the following A lot of help with walking and/or  transfers;A lot of help with bathing/dressing/bathroom;Assistance with cooking/housework;Help with stairs or ramp for entrance   Equipment Recommendations  None recommended by PT    Recommendations for Other Services       Precautions / Restrictions Precautions Precautions: Fall Restrictions Weight Bearing Restrictions: No     Mobility  Bed Mobility Overal bed mobility: Needs Assistance Bed Mobility: Supine to Sit     Supine to sit: HOB elevated, Mod assist     General bed mobility comments: patient demonstrates labored movement, requires hand held assist for trunk elevation and mod assist for scooting to EOB    Transfers Overall transfer level: Needs assistance Equipment used: Rolling walker (2 wheels) Transfers: Sit to/from Stand, Bed to chair/wheelchair/BSC Sit to Stand: Min assist, Mod assist   Step pivot transfers: Mod assist       General transfer comment: patient required 2 attempts for sit to stand w/ RW, is unsteady on feet, transferred to bedside commode, back to EOB, then to chair with mod assist due to weakness and fatigue    Ambulation/Gait Ambulation/Gait assistance: Mod assist Gait Distance (Feet): 3 Feet Assistive device: Rolling walker (2 wheels) Gait Pattern/deviations: Decreased step length - left, Decreased step length - right, Decreased stride length, Trunk flexed Gait velocity: decreased     General Gait Details: patient limited to a few steps for transfers with RW and mod assist due to fatigue/weakness   Stairs             Wheelchair Mobility    Modified Rankin (Stroke Patients Only)       Balance Overall balance assessment: Needs assistance Sitting-balance support: Bilateral upper extremity supported, Feet supported Sitting balance-Leahy Scale: Fair Sitting balance - Comments: fair seated EOB requiring occasional  contact guard assist Postural control: Posterior lean Standing balance support: Bilateral upper extremity  supported, During functional activity, Reliant on assistive device for balance Standing balance-Leahy Scale: Fair Standing balance comment: poor/fair with RW                            Cognition Arousal/Alertness: Awake/alert Behavior During Therapy: WFL for tasks assessed/performed Overall Cognitive Status: Within Functional Limits for tasks assessed                                          Exercises General Exercises - Lower Extremity Long Arc Quad: AROM, Both, 10 reps, Seated Toe Raises: AROM, Both, 15 reps, Seated Heel Raises: AROM, Both, 15 reps, Seated    General Comments        Pertinent Vitals/Pain Pain Assessment Pain Assessment: Faces Faces Pain Scale: Hurts a little bit Pain Location: back Pain Descriptors / Indicators: Sore, Aching Pain Intervention(s): Limited activity within patient's tolerance, Monitored during session, Repositioned    Home Living                          Prior Function            PT Goals (current goals can now be found in the care plan section) Acute Rehab PT Goals Patient Stated Goal: return home with family after rehab PT Goal Formulation: With patient Time For Goal Achievement: 03/16/22 Potential to Achieve Goals: Good Progress towards PT goals: Progressing toward goals    Frequency    Min 3X/week      PT Plan Current plan remains appropriate    Co-evaluation              AM-PAC PT "6 Clicks" Mobility   Outcome Measure  Help needed turning from your back to your side while in a flat bed without using bedrails?: A Little Help needed moving from lying on your back to sitting on the side of a flat bed without using bedrails?: A Little Help needed moving to and from a bed to a chair (including a wheelchair)?: A Lot Help needed standing up from a chair using your arms (e.g., wheelchair or bedside chair)?: A Lot Help needed to walk in hospital room?: A Lot Help needed climbing  3-5 steps with a railing? : A Lot 6 Click Score: 14    End of Session Equipment Utilized During Treatment: Oxygen Activity Tolerance: Patient tolerated treatment well;Patient limited by fatigue Patient left: in chair;with call bell/phone within reach Nurse Communication: Mobility status PT Visit Diagnosis: Unsteadiness on feet (R26.81);Other abnormalities of gait and mobility (R26.89);Muscle weakness (generalized) (M62.81)     Time: 5784-6962 PT Time Calculation (min) (ACUTE ONLY): 32 min  Charges:  $Therapeutic Exercise: 8-22 mins $Therapeutic Activity: 8-22 mins                     Zigmund Gottron, SPT

## 2022-03-02 NOTE — Care Management Important Message (Signed)
Important Message  Patient Details  Name: Robin Mcclure MRN: [id number] Date of Birth: 12-13-1922   Medicare Important Message Given:  Yes     [name] 03/02/2022, 2:20 PM

## 2022-03-02 NOTE — Progress Notes (Signed)
PROGRESS NOTE     Robin Mcclure, is a 88 y.o. female, DOB - 1922-11-20, TIW:[id number]  Admit date - 02/21/2022   Admitting Physician Bernadette Hoit, DO  Outpatient Primary MD for the patient is [name], [name], MD  LOS - 9  Chief Complaint  Patient presents with   Emesis         Subjective: Robin Mcclure examined this morning, arousable, mildly confused, still requiring 8 L of oxygen via nasal cannula  -Oral intake remains challenging--- concerns for aspiration risk -Becomes dyspneic with any exertion including positional change requiring high flow oxygen    Brief Narrative:  Robin Mcclure is a  88 y.o. female with medical history significant of COPD, GERD, hypertension, hyperlipidemia, T2DM admitted on 02/21/2022 with intractable emesis and found to have partial small bowel obstruction  Versus ileus -Unfortunately around 02/24/2022 patient had an aspiration episode  -Assessment and Plan:  1)PSBO Vs Ileus- -improved -- abdominal and chest x-ray from 02/23/22 suggested possible abnormalities of the chest x-ray with regards to vascular structures of the chest--radiologist recommended CT with contrast -CT abdomen and pelvis and CT chest with contrast on 02/23/2022 showed no significant vascular abnormalities of the chest---Shows resolving small bowel obstruction versus ileus--with contrast in the colon -Previously discussed with general surgeon Dr. Constance Haw... She reviewed the images -Conservative management advised possible aspiration episode on 02/24/2022-- --Dulcolax suppository -IV antiemetics as needed --Clinically and radiologically SBO appears to be resolved -Patient had BM, passing gas -NG tube discontinued on 02/26/2022 -Repeat x-rays -resolution of SBO has been recognized  2)Dysphagia--- swallowing difficulties persist -Speech pathologist recommends pured solids and nectar thickened liquids -Oral intake is still remain challenging concerning for aspiration  risk    3)Acute hypoxic respiratory failure due to aspiration pneumonia -in the early hours of 02/24/2022 patient developed significant respiratory distress with hypoxia O2 sats down to the 60s -Required deep suctioning, bronchodilators, high flow oxygen Arterial Blood Gas result:  pO2 44; pCO2 48; pH 7.3;  HCO3 23.6, %O2 Sat 76.8 on 8 L of [place] -Was requiring high flow oxygen, from 10 L down to 8 L, now 6 L, satting 91%  C/n  Augmentin started on 02/24/22--for aspiration pneumonia -Continue mucolytics, bronchodilators and supplemental oxygen - speech evaluation appreciated, dysphagia 1/pured diet recommended with thin liquids -NG tube removed on 02/26/2022 -- Patient to remain in stepdown -due to unstable respiratory status  -Serial chest x-rays  suggest bilateral infiltrates/aspiration -Leukocytosis continues to improve  -Repeat chest x-ray 03/01/2022 IMPRESSION: 1. No change in aeration to the lungs compared with previous exam. 2. Persistent bilateral pleural effusions, left greater than right   4)HTN- Blood pressure has been soft, continue to improve Hold nifedipine  may use IV Hydralazine 10 mg  Every 4 hours Prn for systolic blood pressure over 160 mmhg  5)HyperNatremia/Hyperchloremia with non-anion gap metabolic Acidosis-- -improved - due to free water deficit/dehydration in the setting of small bowel obstruction on n.p.o. status -Patient received IV fluids and bicarb infusion prior to repeat contrast study on 02/23/2022 since patient already had a contrast study on 02/21/2022  -Sodium and chloride ihas normalized -Metabolic acidosis improving -Discontinuing IV fluids as p.o. intake is improving  6)Hypophosphatemia/hypokalemia and hypomagnesemia----risk of refeeding syndrome -Potassium and magnesium is normalized -Phosphorus has improved -Replace  and recheck  7)DM2-A1c 6.4 reflecting excellent diabetic control PTA -Hold Januvia and metformin  -Hyperglycemia noted most  likely due to dextrose infusion IV >> discontinued  -Checking CBG q. ACHS with SSI coverage  8)Social/ethics--- plan of care  and advanced directive discussed with patient and patient's daughter [name] -Patient is a full code -Okay for BiPAP as needed -Palliative care consulted-patient wishes to remain full code,  9) Sever generalized weakness and deconditioning/ambulatory dysfunction--- get physical therapy eval Recommending SNF placement    Prophylaxis:- -Lovenox for DVT prophylaxis and Pepcid for GI prophylaxis  Disposition/Need for in-Hospital Stay- patient unable to be discharged at this time due to - -acute hypoxic respiratory failure due to aspiration pneumonia requiring high flow oxygen -Currently requiring high flow oxygen, up to 10 L of oxygen  -Becomes dyspneic with positional change   Status is: Inpatient   Disposition: The patient is from: Home              Anticipated d/c is to: SNF              Anticipated d/c date is: > 3 days              Patient currently is not medically stable to d/c. Barriers: Not Clinically Stable-   Code Status :  -  Code Status: Full Code   Family Communication:    patient's daughter [name] is primary contact  DVT Prophylaxis  :   - SCDs   enoxaparin (LOVENOX) injection 40 mg Start: 02/25/22 1000 SCDs Start: 02/21/22 2316   Lab Results  Component Value Date   PLT 222 03/02/2022   Inpatient Medications  Scheduled Meds:  amoxicillin-clavulanate  1 tablet Oral Q8H   Chlorhexidine Gluconate Cloth  6 each Topical Daily   dextromethorphan-guaiFENesin  1 tablet Oral BID   enoxaparin (LOVENOX) injection  40 mg Subcutaneous Q24H   famotidine  20 mg Oral Daily   furosemide  40 mg Intravenous Once   insulin aspart  0-5 Units Subcutaneous QHS   insulin aspart  0-9 Units Subcutaneous TID WC   ipratropium-albuterol  3 mL Nebulization TID   lactobacillus  1 g Oral TID WC   mometasone-formoterol  2 puff Inhalation BID    NIFEdipine  90 mg Oral q morning   olopatadine  1 drop Both Eyes BID   mouth rinse  15 mL Mouth Rinse 4 times per day   pravastatin  20 mg Oral q morning   senna  1 tablet Oral Daily   Continuous Infusions:   PRN Meds:.acetaminophen, acetaminophen, albuterol, albuterol, fentaNYL (SUBLIMAZE) injection, hydrALAZINE, ondansetron (ZOFRAN) IV, mouth rinse   Anti-infectives (From admission, onward)    Start     Dose/Rate Route Frequency Ordered Stop   02/26/22 2200  amoxicillin-clavulanate (AUGMENTIN) 500-125 MG per tablet 1 tablet        1 tablet Oral Every 8 hours 02/26/22 2053     02/26/22 1000  amoxicillin-clavulanate (AUGMENTIN) 200-28.5 MG/5ML suspension 500 mg  Status:  Discontinued        500 mg of amoxicillin Per Tube 3 times daily 02/26/22 0815 02/26/22 2053   02/24/22 1530  amoxicillin-clavulanate (AUGMENTIN) 200-28.5 MG/5ML suspension 876 mg  Status:  Discontinued        875 mg of amoxicillin Per Tube Every 12 hours 02/24/22 1430 02/26/22 0815   02/24/22 1515  amoxicillin-clavulanate (AUGMENTIN) 400-57 MG/5ML suspension 875 mg  Status:  Discontinued        875 mg Per Tube Every 12 hours 02/24/22 1418 02/24/22 1430       Objective: Vitals:   03/02/22 0812 03/02/22 0819 03/02/22 1056 03/02/22 1215  BP:      Pulse:      Resp:  Temp:    98 F (36.7 C)  TempSrc:    Oral  SpO2: 97% 93% 91%   Weight:      Height:        Intake/Output Summary (Last 24 hours) at 03/02/2022 1234 Last data filed at 03/02/2022 0500 Gross per 24 hour  Intake 157.97 ml  Output 300 ml  Net -142.03 ml   Filed Weights   02/26/22 0500 02/27/22 0420 03/02/22 0500  Weight: 58.8 kg 60.8 kg 62.4 kg       Physical Exam:   General:  Cachectic, chronically ill looking female  AAO x 2,  cooperative, mild distress; visible shortness of breath, on 8 L of oxygen  HEENT:  Normocephalic, PERRL, otherwise with in Normal limits   Neuro:  CNII-XII intact. , normal motor and sensation, reflexes  intact   Lungs:   Clear to auscultation BL, Respirations unlabored,  No wheezes / crackles  Cardio:    S1/S2, RRR, No murmure, No Rubs or Gallops   Abdomen:  Soft, non-tender, bowel sounds active all four quadrants, no guarding or peritoneal signs.  Muscular  skeletal:  Limited exam -severe global generalized weaknesses - in bed, able to move all 4 extremities,   2+ pulses,  symmetric, No pitting edema  Skin:  Dry, warm to touch, negative for any Rashes,  Wounds: Please see nursing documentation        Data Reviewed: I have personally reviewed following labs and imaging studies  CBC: Recent Labs  Lab 02/25/22 0502 02/26/22 0351 02/27/22 0434 03/01/22 0420 03/02/22 0422  WBC 13.6* 11.9* 12.8* 10.1 13.4*  HGB 11.7* 11.6* 10.9* 9.9* 10.3*  HCT 36.8 35.8* 32.9* 30.4* 31.9*  MCV 98.1 96.5 95.4 96.2 98.8  PLT 234 253 238 205 169   Basic Metabolic Panel: Recent Labs  Lab 02/26/22 0351 02/27/22 0434 02/28/22 0453 03/01/22 0420 03/02/22 0609  NA 144 142 139 142 145  K 3.5 4.0 4.0 3.9 3.8  CL 114* 114* 111 114* 116*  CO2 20* 19* 21* 22 23  GLUCOSE 126* 205* 269* 201* 215*  BUN 26* 30* 26* 21 21  CREATININE 0.83 0.83 0.70 0.70 0.61  CALCIUM 9.3 9.2 8.8* 8.9 9.3  MG  --  1.7  --   --   --   PHOS  --  1.3* 2.4*  --   --    GFR: Estimated Creatinine Clearance: 36.2 mL/min (by C-G formula based on SCr of 0.61 mg/dL). Liver Function Tests: Recent Labs  Lab 02/27/22 0434 02/28/22 0453  ALBUMIN 2.6* 2.5*   HbA1C: No results for input(s): "HGBA1C" in the last 72 hours.  Radiology Studies: [place] CHEST (PLEURAL EFFUSION)  Result Date: 03/01/2022 CLINICAL DATA:  LEFT pleural effusion EXAM: CHEST ULTRASOUND COMPARISON:  Chest radiograph 03/01/2022 FINDINGS: Small LEFT pleural effusion is identified. Based on patient age/size and condition, this is insufficient for thoracentesis. Atelectasis in LEFT lower lobe. IMPRESSION: Small LEFT pleural effusion insufficient for  thoracentesis. Electronically Signed   By: Lavonia Dana M.D.   On: 03/01/2022 12:12   DG CHEST PORT 1 VIEW  Result Date: 03/01/2022 CLINICAL DATA:  Shortness of breath. EXAM: PORTABLE CHEST 1 VIEW COMPARISON:  02/27/2022 FINDINGS: Stable cardiomediastinal contours. Bilateral pleural effusions are noted, left greater than right. Persistent complete retrocardiac opacification of the left lung base. Again noted are patchy bilateral upper lobe and right lung base opacities. These are unchanged compared with the previous exam. IMPRESSION: 1. No change in aeration to the lungs compared  with previous exam. 2. Persistent bilateral pleural effusions, left greater than right. Electronically Signed   By: Kerby Moors M.D.   On: 03/01/2022 08:22   DG ABD ACUTE 2+V W 1V CHEST  Result Date: 02/28/2022 CLINICAL DATA:  Shortness of breath. EXAM: DG ABDOMEN ACUTE WITH 1 VIEW CHEST COMPARISON:  Radiographs 02/27/2022 FINDINGS: Progressive pulmonary infiltrates and probable small bilateral pleural effusions. Contrast noted throughout the colon from a recent swallowing function test. No findings for small bowel obstruction or free air. IMPRESSION: 1. Progressive pulmonary infiltrates and probable small bilateral pleural effusions. 2. No acute abdominal findings. Electronically Signed   By: Marijo Sanes M.D.   On: 02/28/2022 14:36    Scheduled Meds:  amoxicillin-clavulanate  1 tablet Oral Q8H   Chlorhexidine Gluconate Cloth  6 each Topical Daily   dextromethorphan-guaiFENesin  1 tablet Oral BID   enoxaparin (LOVENOX) injection  40 mg Subcutaneous Q24H   famotidine  20 mg Oral Daily   furosemide  40 mg Intravenous Once   insulin aspart  0-5 Units Subcutaneous QHS   insulin aspart  0-9 Units Subcutaneous TID WC   ipratropium-albuterol  3 mL Nebulization TID   lactobacillus  1 g Oral TID WC   mometasone-formoterol  2 puff Inhalation BID   NIFEdipine  90 mg Oral q morning   olopatadine  1 drop Both Eyes BID    mouth rinse  15 mL Mouth Rinse 4 times per day   pravastatin  20 mg Oral q morning   senna  1 tablet Oral Daily   Continuous Infusions:    LOS: 9 days   Deatra James M.D on 03/02/2022 at 12:34 PM  Go to [email or web address] - for contact info  [facility] - Office  [phone]  If 7PM-7AM, please contact night-coverage [email or web address] 03/02/2022, 12:34 PM

## 2022-03-03 DIAGNOSIS — J9601 Acute respiratory failure with hypoxia: Secondary | ICD-10-CM | POA: Diagnosis not present

## 2022-03-03 LAB — BASIC METABOLIC PANEL
Anion gap: 7 (ref 5–15)
BUN: 19 mg/dL (ref 8–23)
CO2: 24 mmol/L (ref 22–32)
Calcium: 9.1 mg/dL (ref 8.9–10.3)
Chloride: 114 mmol/L — ABNORMAL HIGH (ref 98–111)
Creatinine, Ser: 0.63 mg/dL (ref 0.44–1.00)
GFR, Estimated: >60 mL/min (ref >60)
Glucose, Bld: 187 mg/dL — ABNORMAL HIGH (ref 70–99)
Potassium: 3.6 mmol/L (ref 3.5–5.1)
Sodium: 145 mmol/L (ref 135–145)

## 2022-03-03 LAB — GLUCOSE, CAPILLARY
Glucose-Capillary: 195 mg/dL — ABNORMAL HIGH (ref 70–99)
Glucose-Capillary: 193 mg/dL — ABNORMAL HIGH (ref 70–99)
Glucose-Capillary: 191 mg/dL — ABNORMAL HIGH (ref 70–99)
Glucose-Capillary: 209 mg/dL — ABNORMAL HIGH (ref 70–99)

## 2022-03-03 LAB — CBC
HCT: 31.9 % — ABNORMAL LOW (ref 36.0–46.0)
Hemoglobin: 10.3 g/dL — ABNORMAL LOW (ref 12.0–15.0)
MCH: 31.1 pg (ref 26.0–34.0)
MCHC: 32.3 g/dL (ref 30.0–36.0)
MCV: 96.4 fL (ref 80.0–100.0)
Platelets: 229 10*3/uL (ref 150–400)
RBC: 3.31 MIL/uL — ABNORMAL LOW (ref 3.87–5.11)
RDW: 14.6 % (ref 11.5–15.5)
WBC: 12.7 10*3/uL — ABNORMAL HIGH (ref 4.0–10.5)
nRBC: 0 % (ref 0.0–0.2)

## 2022-03-03 MED ORDER — AMOXICILLIN-POT CLAVULANATE 875-125 MG PO TABS
1.0000 | ORAL_TABLET | Freq: Two times a day (BID) | ORAL | Status: DC
  Administered 2022-03-03 – 2022-03-05 (×5): 1 via ORAL
  Filled 2022-03-03 (×5): qty 1

## 2022-03-03 MED ORDER — GUAIFENESIN-DM 100-10 MG/5ML PO SYRP
10.0000 mL | ORAL_SOLUTION | Freq: Three times a day (TID) | ORAL | Status: DC
  Administered 2022-03-03 – 2022-03-05 (×8): 10 mL via ORAL
  Filled 2022-03-03 (×8): qty 10

## 2022-03-03 NOTE — Progress Notes (Signed)
PROGRESS NOTE     Robin Mcclure, is a 88 y.o. female, DOB - 12/22/1922, NAT:[id number]  Admit date - 02/21/2022   Admitting Physician Bernadette Hoit, DO  Outpatient Primary MD for the patient is [name], [name], MD  LOS - 49  Chief Complaint  Patient presents with   Emesis         Subjective: Robin Mcclure was seen and examined this morning, stable awake alert oriented, cooperative, O2 demand has improved to 6 L, satting 96%, audible rhonchi noted Reporting no issues overnight    Brief Narrative:  Robin Mcclure is a  88 y.o. female with medical history significant of COPD, GERD, hypertension, hyperlipidemia, T2DM admitted on 02/21/2022 with intractable emesis and found to have partial small bowel obstruction  Versus ileus -Unfortunately around 02/24/2022 patient had an aspiration episode  -Assessment and Plan:  1)PSBO Vs Ileus- -improved -- abdominal and chest x-ray from 02/23/22 suggested possible abnormalities of the chest x-ray with regards to vascular structures of the chest--radiologist recommended CT with contrast -CT abdomen and pelvis and CT chest with contrast on 02/23/2022 showed no significant vascular abnormalities of the chest---Shows resolving small bowel obstruction versus ileus--with contrast in the colon -Previously discussed with general surgeon Dr. Constance Haw... She reviewed the images -Conservative management advised possible aspiration episode on 02/24/2022-- --Dulcolax suppository -IV antiemetics as needed --Clinically and radiologically SBO appears to be resolved -Patient had BM, passing gas -NG tube discontinued on 02/26/2022 -Repeat x-rays -resolution of SBO has been recognized  2)Dysphagia--- swallowing difficulties persist -Speech pathologist recommends pured solids and nectar thickened liquids -Oral intake is still remain challenging concerning for aspiration risk    3)Acute hypoxic respiratory failure due to aspiration pneumonia -in the early  hours of 02/24/2022 patient developed significant respiratory distress with hypoxia O2 sats down to the 60s -Required deep suctioning, bronchodilators, high flow oxygen Arterial Blood Gas result:  pO2 44; pCO2 48; pH 7.3;  HCO3 23.6, %O2 Sat 76.8 on 8 L of [place] -Was requiring high flow oxygen, from 10 L down to 8 L, now 6 L, satting 96%  C/n  Augmentin started on 02/24/22--for aspiration pneumonia -Continue mucolytics, bronchodilators and supplemental oxygen - speech evaluation appreciated, dysphagia 1/pured diet recommended with thin liquids -NG tube removed on 02/26/2022 -- Patient to remain in stepdown -due to unstable respiratory status  -Serial chest x-rays  suggest bilateral infiltrates/aspiration -Leukocytosis continues to improve  -Repeat chest x-ray 03/01/2022 IMPRESSION: 1. No change in aeration to the lungs compared with previous exam. 2. Persistent bilateral pleural effusions, left greater than right   4)HTN- Blood pressure has been soft, continue to improve Hold nifedipine  may use IV Hydralazine 10 mg  Every 4 hours Prn for systolic blood pressure over 160 mmhg  5)HyperNatremia/Hyperchloremia with non-anion gap metabolic Acidosis-- -improved -Responded to IV fluids, bicarb, stable now  -Off IV fluids   6)Hypophosphatemia/hypokalemia and hypomagnesemia----risk of refeeding syndrome -Potassium and magnesium is normalized   7)DM2-A1c 6.4 reflecting excellent diabetic control PTA -Hold Januvia and metformin -Checking CBG q. ACHS with SSI coverage  8)Social/ethics--- plan of care and advanced directive discussed with patient and patient's daughter [name] -Patient is a full code -Okay for BiPAP as needed -Palliative care consulted-patient wishes to remain full code,  9) Sever generalized weakness and deconditioning/ambulatory dysfunction--- get physical therapy eval Recommending SNF placement    Prophylaxis:- -Lovenox for DVT prophylaxis and Pepcid for  GI prophylaxis  Disposition/Need for in-Hospital Stay- patient unable to be discharged at this time due to - -  acute hypoxic respiratory failure due to aspiration pneumonia requiring high flow oxygen -Currently requiring high flow oxygen, up to 10 L of oxygen  -Becomes dyspneic with positional change   Status is: Inpatient   Disposition: The patient is from: Home              Anticipated d/c is to: SNF              Anticipated d/c date is: Likely this Monday, 03/05/2022              Patient currently is not medically stable to d/c. Barriers: Not Clinically Stable-   Code Status :  -  Code Status: Full Code   Family Communication:    patient's daughter [name] is primary contact  DVT Prophylaxis  :   - SCDs   enoxaparin (LOVENOX) injection 40 mg Start: 02/25/22 1000 SCDs Start: 02/21/22 2316   Lab Results  Component Value Date   PLT 229 03/03/2022   Inpatient Medications  Scheduled Meds:  amoxicillin-clavulanate  1 tablet Oral Q12H   Chlorhexidine Gluconate Cloth  6 each Topical Daily   enoxaparin (LOVENOX) injection  40 mg Subcutaneous Q24H   famotidine  20 mg Oral Daily   guaiFENesin-dextromethorphan  10 mL Oral Q8H   insulin aspart  0-5 Units Subcutaneous QHS   insulin aspart  0-9 Units Subcutaneous TID WC   ipratropium-albuterol  3 mL Nebulization TID   lactobacillus  1 g Oral TID WC   mometasone-formoterol  2 puff Inhalation BID   NIFEdipine  90 mg Oral q morning   olopatadine  1 drop Both Eyes BID   mouth rinse  15 mL Mouth Rinse 4 times per day   pravastatin  20 mg Oral q morning   senna  1 tablet Oral Daily   Continuous Infusions:   PRN Meds:.acetaminophen, acetaminophen, albuterol, albuterol, fentaNYL (SUBLIMAZE) injection, hydrALAZINE, ondansetron (ZOFRAN) IV, mouth rinse   Anti-infectives (From admission, onward)    Start     Dose/Rate Route Frequency Ordered Stop   03/03/22 1000  amoxicillin-clavulanate (AUGMENTIN) 875-125 MG per tablet 1  tablet        1 tablet Oral Every 12 hours 03/03/22 0857     02/26/22 2200  amoxicillin-clavulanate (AUGMENTIN) 500-125 MG per tablet 1 tablet  Status:  Discontinued        1 tablet Oral Every 8 hours 02/26/22 2053 03/03/22 0857   02/26/22 1000  amoxicillin-clavulanate (AUGMENTIN) 200-28.5 MG/5ML suspension 500 mg  Status:  Discontinued        500 mg of amoxicillin Per Tube 3 times daily 02/26/22 0815 02/26/22 2053   02/24/22 1530  amoxicillin-clavulanate (AUGMENTIN) 200-28.5 MG/5ML suspension 876 mg  Status:  Discontinued        875 mg of amoxicillin Per Tube Every 12 hours 02/24/22 1430 02/26/22 0815   02/24/22 1515  amoxicillin-clavulanate (AUGMENTIN) 400-57 MG/5ML suspension 875 mg  Status:  Discontinued        875 mg Per Tube Every 12 hours 02/24/22 1418 02/24/22 1430       Objective: Vitals:   03/03/22 0900 03/03/22 0901 03/03/22 1000 03/03/22 1031  BP: 137/75  (!) 135/54   Pulse: 77  63   Resp: 19  16   Temp:      TempSrc:      SpO2:  90% (P) 90% 96%  Weight:      Height:        Intake/Output Summary (Last 24 hours) at 03/03/2022 1050 Last  data filed at 03/02/2022 1938 Gross per 24 hour  Intake 3.26 ml  Output --  Net 3.26 ml   Filed Weights   02/26/22 0500 02/27/22 0420 03/02/22 0500  Weight: 58.8 kg 60.8 kg 62.4 kg  Physical Exam:   General:  Chronically ill looking female, O2 demand has improved, hypoxia has improved  AAO x 3,  cooperative, no distress;   HEENT:  Normocephalic, PERRL, otherwise with in Normal limits   Neuro:  CNII-XII intact. , normal motor and sensation, reflexes intact   Lungs:   Clear to auscultation BL, Respirations unlabored,  No wheezes / crackles  Cardio:    S1/S2, RRR, No murmure, No Rubs or Gallops   Abdomen:  Soft, non-tender, bowel sounds active all four quadrants, no guarding or peritoneal signs.  Muscular  skeletal:  Limited exam - sever global generalized weaknesses - in bed, able to move all 4 extremities,   2+ pulses,   symmetric, No pitting edema  Skin:  Dry, warm to touch, negative for any Rashes,  Wounds: Please see nursing documentation      Data Reviewed: I have personally reviewed following labs and imaging studies  CBC: Recent Labs  Lab 02/26/22 0351 02/27/22 0434 03/01/22 0420 03/02/22 0422 03/03/22 0422  WBC 11.9* 12.8* 10.1 13.4* 12.7*  HGB 11.6* 10.9* 9.9* 10.3* 10.3*  HCT 35.8* 32.9* 30.4* 31.9* 31.9*  MCV 96.5 95.4 96.2 98.8 96.4  PLT 253 238 205 222 458   Basic Metabolic Panel: Recent Labs  Lab 02/27/22 0434 02/28/22 0453 03/01/22 0420 03/02/22 0609 03/03/22 0422  NA 142 139 142 145 145  K 4.0 4.0 3.9 3.8 3.6  CL 114* 111 114* 116* 114*  CO2 19* 21* '22 23 24  '$ GLUCOSE 205* 269* 201* 215* 187*  BUN 30* 26* '21 21 19  '$ CREATININE 0.83 0.70 0.70 0.61 0.63  CALCIUM 9.2 8.8* 8.9 9.3 9.1  MG 1.7  --   --   --   --   PHOS 1.3* 2.4*  --   --   --    GFR: Estimated Creatinine Clearance: 36.2 mL/min (by C-G formula based on SCr of 0.63 mg/dL). Liver Function Tests: Recent Labs  Lab 02/27/22 0434 02/28/22 0453  ALBUMIN 2.6* 2.5*   HbA1C: No results for input(s): "HGBA1C" in the last 72 hours.  Radiology Studies: [place] CHEST (PLEURAL EFFUSION)  Result Date: 03/01/2022 CLINICAL DATA:  LEFT pleural effusion EXAM: CHEST ULTRASOUND COMPARISON:  Chest radiograph 03/01/2022 FINDINGS: Small LEFT pleural effusion is identified. Based on patient age/size and condition, this is insufficient for thoracentesis. Atelectasis in LEFT lower lobe. IMPRESSION: Small LEFT pleural effusion insufficient for thoracentesis. Electronically Signed   By: Lavonia Dana M.D.   On: 03/01/2022 12:12    Scheduled Meds:  amoxicillin-clavulanate  1 tablet Oral Q12H   Chlorhexidine Gluconate Cloth  6 each Topical Daily   enoxaparin (LOVENOX) injection  40 mg Subcutaneous Q24H   famotidine  20 mg Oral Daily   guaiFENesin-dextromethorphan  10 mL Oral Q8H   insulin aspart  0-5 Units Subcutaneous QHS   insulin  aspart  0-9 Units Subcutaneous TID WC   ipratropium-albuterol  3 mL Nebulization TID   lactobacillus  1 g Oral TID WC   mometasone-formoterol  2 puff Inhalation BID   NIFEdipine  90 mg Oral q morning   olopatadine  1 drop Both Eyes BID   mouth rinse  15 mL Mouth Rinse 4 times per day   pravastatin  20 mg Oral  q morning   senna  1 tablet Oral Daily   Continuous Infusions:    LOS: 10 days   Deatra James M.D on 03/03/2022 at 10:50 AM  Go to [email or web address] - for contact info  [facility] - Office  [phone]  If 7PM-7AM, please contact night-coverage [email or web address] 03/03/2022, 10:50 AM

## 2022-03-04 DIAGNOSIS — J9601 Acute respiratory failure with hypoxia: Secondary | ICD-10-CM | POA: Diagnosis not present

## 2022-03-04 LAB — GLUCOSE, CAPILLARY
Glucose-Capillary: 208 mg/dL — ABNORMAL HIGH (ref 70–99)
Glucose-Capillary: 198 mg/dL — ABNORMAL HIGH (ref 70–99)
Glucose-Capillary: 143 mg/dL — ABNORMAL HIGH (ref 70–99)
Glucose-Capillary: 135 mg/dL — ABNORMAL HIGH (ref 70–99)

## 2022-03-04 LAB — BASIC METABOLIC PANEL
Anion gap: 8 (ref 5–15)
BUN: 17 mg/dL (ref 8–23)
CO2: 23 mmol/L (ref 22–32)
Calcium: 9.6 mg/dL (ref 8.9–10.3)
Chloride: 116 mmol/L — ABNORMAL HIGH (ref 98–111)
Creatinine, Ser: 0.64 mg/dL (ref 0.44–1.00)
GFR, Estimated: >60 mL/min (ref >60)
Glucose, Bld: 200 mg/dL — ABNORMAL HIGH (ref 70–99)
Potassium: 3.5 mmol/L (ref 3.5–5.1)
Sodium: 147 mmol/L — ABNORMAL HIGH (ref 135–145)

## 2022-03-04 LAB — CBC
HCT: 32.8 % — ABNORMAL LOW (ref 36.0–46.0)
Hemoglobin: 10.7 g/dL — ABNORMAL LOW (ref 12.0–15.0)
MCH: 31.3 pg (ref 26.0–34.0)
MCHC: 32.6 g/dL (ref 30.0–36.0)
MCV: 95.9 fL (ref 80.0–100.0)
Platelets: 245 10*3/uL (ref 150–400)
RBC: 3.42 MIL/uL — ABNORMAL LOW (ref 3.87–5.11)
RDW: 14.6 % (ref 11.5–15.5)
WBC: 11.9 10*3/uL — ABNORMAL HIGH (ref 4.0–10.5)
nRBC: 0 % (ref 0.0–0.2)

## 2022-03-04 MED ORDER — GLYCOPYRROLATE 1 MG PO TABS
1.0000 mg | ORAL_TABLET | Freq: Two times a day (BID) | ORAL | Status: DC
  Administered 2022-03-04 – 2022-03-05 (×3): 1 mg via ORAL
  Filled 2022-03-04 (×3): qty 1

## 2022-03-04 NOTE — Progress Notes (Signed)
PROGRESS NOTE     Robin Mcclure, is a 88 y.o. female, DOB - 10-27-1922, AYT:[id number]  Admit date - 02/21/2022   Admitting Physician Bernadette Hoit, DO  Outpatient Primary MD for the patient is [name], [name], MD  LOS - 83  Chief Complaint  Patient presents with   Emesis         Subjective: Robin Mcclure examined this morning, laying comfortably in bed, awake, following command, cooperative, still requiring 6 L of oxygen, but satting 100% Respiratory therapist present at bedside Audible rhonchi     Brief Narrative:  Robin Mcclure is a  88 y.o. female with medical history significant of COPD, GERD, hypertension, hyperlipidemia, T2DM admitted on 02/21/2022 with intractable emesis and found to have partial small bowel obstruction  Versus ileus -Unfortunately around 02/24/2022 patient had an aspiration episode  -Assessment and Plan:  1)PSBO Vs Ileus- -much improved  -- abdominal and chest x-ray from 02/23/22 suggested possible abnormalities of the chest x-ray with regards to vascular structures of the chest--radiologist recommended CT with contrast -CT abdomen and pelvis and CT chest with contrast on 02/23/2022 showed no significant vascular abnormalities of the chest---Shows resolving small bowel obstruction versus ileus--with contrast in the colon -Previously discussed with general surgeon Dr. Constance Haw... She reviewed the images -Conservative management advised possible aspiration episode on 02/24/2022-- --Dulcolax suppository -IV antiemetics as needed --Clinically and radiologically SBO appears to be resolved -Patient had BM, passing gas -NG tube discontinued on 02/26/2022 -Repeat x-rays -resolution of SBO has been recognized  2)Dysphagia--- persistent swallowing difficulties -High risk for aspiration -Speech pathologist recommends pured solids and nectar thickened liquids -Oral intake is still remain challenging concerning for aspiration risk    3)Acute hypoxic  respiratory failure due to aspiration pneumonia -Much improved down to 6 L of oxygen, satting 100%  -in the early hours of 02/24/2022 patient developed significant respiratory distress with hypoxia O2 sats down to the 60s -Required deep suctioning, bronchodilators, high flow oxygen Arterial Blood Gas result:  pO2 44; pCO2 48; pH 7.3;  HCO3 23.6, %O2 Sat 76.8 on 8 L of [place] --Weaned off high flow oxygen  C/n  Augmentin started on 02/24/22--for aspiration pneumonia -Continue mucolytics, bronchodilators and supplemental oxygen - speech evaluation appreciated, dysphagia 1/pured diet recommended with thin liquids -NG tube removed on 02/26/2022 -- Patient to remain in stepdown -due to unstable respiratory status  -Serial chest x-rays  suggest bilateral infiltrates/aspiration -Leukocytosis continues to improve  -Repeat CXR 03/01/2022 IMPRESSION: No changes to aeration compared to previous exam, bilateral pleural effusion   4)HTN- Blood pressure has been soft, continue to improve Hold nifedipine  may use IV Hydralazine 10 mg  Every 4 hours Prn for systolic blood pressure over 160 mmhg  5)HyperNatremia/Hyperchloremia with non-anion gap metabolic Acidosis-- -improved -Responded to IV fluids, bicarb, stable now  -Off IV fluids   6)Hypophosphatemia/hypokalemia and hypomagnesemia----risk of refeeding syndrome -Potassium and magnesium is normalized   7)DM2-A1c 6.4 reflecting excellent diabetic control PTA -Hold Januvia and metformin -Checking CBG q. ACHS with SSI coverage  8)Social/ethics--- plan of care and advanced directive discussed with patient and patient's daughter [name] -Patient is a full code -Okay for BiPAP as needed -Palliative care consulted-patient wishes to remain full code,  9) Sever generalized weakness and deconditioning/ambulatory dysfunction--- get physical therapy eval Recommending SNF placement    Prophylaxis:- -Lovenox for DVT prophylaxis and Pepcid  for GI prophylaxis  Disposition/Need for in-Hospital Stay- patient unable to be discharged at this time due to - -acute hypoxic respiratory failure  due to aspiration pneumonia requiring high flow oxygen -Currently requiring high flow oxygen, up to 10 L of oxygen  -Becomes dyspneic with positional change   Status is: Inpatient   Disposition: The patient is from: Home              Anticipated d/c is to: SNF              Anticipated d/c date is: Likely this Monday, 03/05/2022              Patient currently is not medically stable to d/c. Barriers: Not Clinically Stable-   Code Status :  -  Code Status: Full Code   Family Communication:    patient's daughter [name] is primary contact  DVT Prophylaxis  :   - SCDs   enoxaparin (LOVENOX) injection 40 mg Start: 02/25/22 1000 SCDs Start: 02/21/22 2316   Lab Results  Component Value Date   PLT 245 03/04/2022   Inpatient Medications  Scheduled Meds:  amoxicillin-clavulanate  1 tablet Oral Q12H   Chlorhexidine Gluconate Cloth  6 each Topical Daily   enoxaparin (LOVENOX) injection  40 mg Subcutaneous Q24H   famotidine  20 mg Oral Daily   guaiFENesin-dextromethorphan  10 mL Oral Q8H   insulin aspart  0-5 Units Subcutaneous QHS   insulin aspart  0-9 Units Subcutaneous TID WC   ipratropium-albuterol  3 mL Nebulization TID   lactobacillus  1 g Oral TID WC   mometasone-formoterol  2 puff Inhalation BID   NIFEdipine  90 mg Oral q morning   olopatadine  1 drop Both Eyes BID   mouth rinse  15 mL Mouth Rinse 4 times per day   pravastatin  20 mg Oral q morning   senna  1 tablet Oral Daily   Continuous Infusions:   PRN Meds:.acetaminophen, acetaminophen, albuterol, albuterol, fentaNYL (SUBLIMAZE) injection, hydrALAZINE, ondansetron (ZOFRAN) IV, mouth rinse   Anti-infectives (From admission, onward)    Start     Dose/Rate Route Frequency Ordered Stop   03/03/22 1000  amoxicillin-clavulanate (AUGMENTIN) 875-125 MG per tablet 1  tablet        1 tablet Oral Every 12 hours 03/03/22 0857     02/26/22 2200  amoxicillin-clavulanate (AUGMENTIN) 500-125 MG per tablet 1 tablet  Status:  Discontinued        1 tablet Oral Every 8 hours 02/26/22 2053 03/03/22 0857   02/26/22 1000  amoxicillin-clavulanate (AUGMENTIN) 200-28.5 MG/5ML suspension 500 mg  Status:  Discontinued        500 mg of amoxicillin Per Tube 3 times daily 02/26/22 0815 02/26/22 2053   02/24/22 1530  amoxicillin-clavulanate (AUGMENTIN) 200-28.5 MG/5ML suspension 876 mg  Status:  Discontinued        875 mg of amoxicillin Per Tube Every 12 hours 02/24/22 1430 02/26/22 0815   02/24/22 1515  amoxicillin-clavulanate (AUGMENTIN) 400-57 MG/5ML suspension 875 mg  Status:  Discontinued        875 mg Per Tube Every 12 hours 02/24/22 1418 02/24/22 1430       Objective: Vitals:   03/04/22 0751 03/04/22 0800 03/04/22 0900 03/04/22 1100  BP:  128/61 (!) 145/53 110/80  Pulse:  69 100 79  Resp:  '19 20 19  '$ Temp: 98 F (36.7 C)     TempSrc: Oral     SpO2:  100% 100% 100%  Weight:      Height:        Intake/Output Summary (Last 24 hours) at 03/04/2022 1141 Last data filed  at 03/04/2022 0300 Gross per 24 hour  Intake --  Output 475 ml  Net -475 ml   Filed Weights   02/26/22 0500 02/27/22 0420 03/02/22 0500  Weight: 58.8 kg 60.8 kg 62.4 kg     Physical Exam:   General:  Chronically ill looking, cachectic female, AAO x 2,  cooperative, mild-moderate shortness of breath  HEENT:  Normocephalic, PERRL, otherwise with in Normal limits   Neuro:  CNII-XII intact. , normal motor and sensation, reflexes intact   Lungs:   Extensive diffuse rhonchi, improved wheezing, minimal crackles deep lower lobes Shortness of breath at rest  Cardio:    S1/S2, RRR, No murmure, No Rubs or Gallops   Abdomen:  Soft, non-tender, bowel sounds active all four quadrants, no guarding or peritoneal signs.  Muscular  skeletal:  Limited exam - sever global generalized weaknesses - in  bed, able to move all 4 extremities,   2+ pulses,  symmetric, No pitting edema  Skin:  Dry, warm to touch, negative for any Rashes,  Wounds: Please see nursing documentation          Data Reviewed: I have personally reviewed following labs and imaging studies  CBC: Recent Labs  Lab 02/27/22 0434 03/01/22 0420 03/02/22 0422 03/03/22 0422 03/04/22 0421  WBC 12.8* 10.1 13.4* 12.7* 11.9*  HGB 10.9* 9.9* 10.3* 10.3* 10.7*  HCT 32.9* 30.4* 31.9* 31.9* 32.8*  MCV 95.4 96.2 98.8 96.4 95.9  PLT 238 205 222 229 163   Basic Metabolic Panel: Recent Labs  Lab 02/27/22 0434 02/28/22 0453 03/01/22 0420 03/02/22 0609 03/03/22 0422 03/04/22 0421  NA 142 139 142 145 145 147*  K 4.0 4.0 3.9 3.8 3.6 3.5  CL 114* 111 114* 116* 114* 116*  CO2 19* 21* '22 23 24 23  '$ GLUCOSE 205* 269* 201* 215* 187* 200*  BUN 30* 26* '21 21 19 17  '$ CREATININE 0.83 0.70 0.70 0.61 0.63 0.64  CALCIUM 9.2 8.8* 8.9 9.3 9.1 9.6  MG 1.7  --   --   --   --   --   PHOS 1.3* 2.4*  --   --   --   --    GFR: Estimated Creatinine Clearance: 36.2 mL/min (by C-G formula based on SCr of 0.64 mg/dL). Liver Function Tests: Recent Labs  Lab 02/27/22 0434 02/28/22 0453  ALBUMIN 2.6* 2.5*   HbA1C: No results for input(s): "HGBA1C" in the last 72 hours.  Radiology Studies: No results found.  Scheduled Meds:  amoxicillin-clavulanate  1 tablet Oral Q12H   Chlorhexidine Gluconate Cloth  6 each Topical Daily   enoxaparin (LOVENOX) injection  40 mg Subcutaneous Q24H   famotidine  20 mg Oral Daily   guaiFENesin-dextromethorphan  10 mL Oral Q8H   insulin aspart  0-5 Units Subcutaneous QHS   insulin aspart  0-9 Units Subcutaneous TID WC   ipratropium-albuterol  3 mL Nebulization TID   lactobacillus  1 g Oral TID WC   mometasone-formoterol  2 puff Inhalation BID   NIFEdipine  90 mg Oral q morning   olopatadine  1 drop Both Eyes BID   mouth rinse  15 mL Mouth Rinse 4 times per day   pravastatin  20 mg Oral q morning    senna  1 tablet Oral Daily   Continuous Infusions:    LOS: 11 days   Robin Mcclure M.D on 03/04/2022 at 11:41 AM  Go to [email or web address] - for contact info  [facility] - Office  [phone]  If 7PM-7AM, please contact night-coverage [email or web address] 03/04/2022, 11:41 AM

## 2022-03-04 NOTE — Progress Notes (Signed)
Started patient on [place], gave neb , worked with patient to cough up secretions. No CPT by bed as bed is broken. Worked about 20 Minutes. Patient cough up small amount but mostly just coughed.

## 2022-03-04 NOTE — Progress Notes (Signed)
Switched pt. To another bed that has chest PT. Pt. Tolerated well and chest PT currently on, pt. Tolerating well.

## 2022-03-05 DIAGNOSIS — R69 Illness, unspecified: Secondary | ICD-10-CM | POA: Diagnosis not present

## 2022-03-05 DIAGNOSIS — R55 Syncope and collapse: Secondary | ICD-10-CM | POA: Diagnosis not present

## 2022-03-05 DIAGNOSIS — E785 Hyperlipidemia, unspecified: Secondary | ICD-10-CM | POA: Diagnosis not present

## 2022-03-05 DIAGNOSIS — S22089D Unspecified fracture of T11-T12 vertebra, subsequent encounter for fracture with routine healing: Secondary | ICD-10-CM | POA: Diagnosis not present

## 2022-03-05 DIAGNOSIS — Z7401 Bed confinement status: Secondary | ICD-10-CM | POA: Diagnosis not present

## 2022-03-05 DIAGNOSIS — K56600 Partial intestinal obstruction, unspecified as to cause: Secondary | ICD-10-CM | POA: Diagnosis not present

## 2022-03-05 DIAGNOSIS — E870 Hyperosmolality and hypernatremia: Secondary | ICD-10-CM | POA: Diagnosis not present

## 2022-03-05 DIAGNOSIS — Z515 Encounter for palliative care: Secondary | ICD-10-CM | POA: Diagnosis not present

## 2022-03-05 DIAGNOSIS — I2489 Other forms of acute ischemic heart disease: Secondary | ICD-10-CM | POA: Diagnosis not present

## 2022-03-05 DIAGNOSIS — E1165 Type 2 diabetes mellitus with hyperglycemia: Secondary | ICD-10-CM | POA: Diagnosis not present

## 2022-03-05 DIAGNOSIS — Z7984 Long term (current) use of oral hypoglycemic drugs: Secondary | ICD-10-CM | POA: Diagnosis not present

## 2022-03-05 DIAGNOSIS — I700 Atherosclerosis of aorta: Secondary | ICD-10-CM | POA: Diagnosis not present

## 2022-03-05 DIAGNOSIS — Z7951 Long term (current) use of inhaled steroids: Secondary | ICD-10-CM | POA: Diagnosis not present

## 2022-03-05 DIAGNOSIS — Z20822 Contact with and (suspected) exposure to covid-19: Secondary | ICD-10-CM | POA: Diagnosis not present

## 2022-03-05 DIAGNOSIS — J90 Pleural effusion, not elsewhere classified: Secondary | ICD-10-CM | POA: Diagnosis not present

## 2022-03-05 DIAGNOSIS — Z66 Do not resuscitate: Secondary | ICD-10-CM | POA: Diagnosis not present

## 2022-03-05 DIAGNOSIS — Z90710 Acquired absence of both cervix and uterus: Secondary | ICD-10-CM | POA: Diagnosis not present

## 2022-03-05 DIAGNOSIS — R739 Hyperglycemia, unspecified: Secondary | ICD-10-CM | POA: Diagnosis not present

## 2022-03-05 DIAGNOSIS — A419 Sepsis, unspecified organism: Secondary | ICD-10-CM | POA: Diagnosis not present

## 2022-03-05 DIAGNOSIS — E441 Mild protein-calorie malnutrition: Secondary | ICD-10-CM | POA: Diagnosis not present

## 2022-03-05 DIAGNOSIS — E860 Dehydration: Secondary | ICD-10-CM | POA: Diagnosis not present

## 2022-03-05 DIAGNOSIS — K219 Gastro-esophageal reflux disease without esophagitis: Secondary | ICD-10-CM | POA: Diagnosis not present

## 2022-03-05 DIAGNOSIS — R1310 Dysphagia, unspecified: Secondary | ICD-10-CM | POA: Diagnosis not present

## 2022-03-05 DIAGNOSIS — J9811 Atelectasis: Secondary | ICD-10-CM | POA: Diagnosis not present

## 2022-03-05 DIAGNOSIS — R5381 Other malaise: Secondary | ICD-10-CM | POA: Diagnosis not present

## 2022-03-05 DIAGNOSIS — Z978 Presence of other specified devices: Secondary | ICD-10-CM | POA: Diagnosis not present

## 2022-03-05 DIAGNOSIS — E119 Type 2 diabetes mellitus without complications: Secondary | ICD-10-CM | POA: Diagnosis not present

## 2022-03-05 DIAGNOSIS — D649 Anemia, unspecified: Secondary | ICD-10-CM | POA: Diagnosis not present

## 2022-03-05 DIAGNOSIS — K5730 Diverticulosis of large intestine without perforation or abscess without bleeding: Secondary | ICD-10-CM | POA: Diagnosis not present

## 2022-03-05 DIAGNOSIS — M7989 Other specified soft tissue disorders: Secondary | ICD-10-CM | POA: Diagnosis not present

## 2022-03-05 DIAGNOSIS — F419 Anxiety disorder, unspecified: Secondary | ICD-10-CM | POA: Diagnosis not present

## 2022-03-05 DIAGNOSIS — N139 Obstructive and reflux uropathy, unspecified: Secondary | ICD-10-CM | POA: Diagnosis not present

## 2022-03-05 DIAGNOSIS — J690 Pneumonitis due to inhalation of food and vomit: Secondary | ICD-10-CM | POA: Diagnosis not present

## 2022-03-05 DIAGNOSIS — R0602 Shortness of breath: Secondary | ICD-10-CM | POA: Diagnosis not present

## 2022-03-05 DIAGNOSIS — R6520 Severe sepsis without septic shock: Secondary | ICD-10-CM | POA: Diagnosis not present

## 2022-03-05 DIAGNOSIS — I4891 Unspecified atrial fibrillation: Secondary | ICD-10-CM | POA: Diagnosis not present

## 2022-03-05 DIAGNOSIS — G9340 Encephalopathy, unspecified: Secondary | ICD-10-CM | POA: Diagnosis not present

## 2022-03-05 DIAGNOSIS — I10 Essential (primary) hypertension: Secondary | ICD-10-CM | POA: Diagnosis not present

## 2022-03-05 DIAGNOSIS — Z7189 Other specified counseling: Secondary | ICD-10-CM | POA: Diagnosis not present

## 2022-03-05 DIAGNOSIS — J449 Chronic obstructive pulmonary disease, unspecified: Secondary | ICD-10-CM | POA: Diagnosis not present

## 2022-03-05 DIAGNOSIS — R069 Unspecified abnormalities of breathing: Secondary | ICD-10-CM | POA: Diagnosis not present

## 2022-03-05 DIAGNOSIS — E46 Unspecified protein-calorie malnutrition: Secondary | ICD-10-CM | POA: Diagnosis not present

## 2022-03-05 DIAGNOSIS — E8770 Fluid overload, unspecified: Secondary | ICD-10-CM | POA: Diagnosis not present

## 2022-03-05 DIAGNOSIS — E8809 Other disorders of plasma-protein metabolism, not elsewhere classified: Secondary | ICD-10-CM | POA: Diagnosis not present

## 2022-03-05 DIAGNOSIS — I469 Cardiac arrest, cause unspecified: Secondary | ICD-10-CM | POA: Diagnosis not present

## 2022-03-05 DIAGNOSIS — Z79899 Other long term (current) drug therapy: Secondary | ICD-10-CM | POA: Diagnosis not present

## 2022-03-05 DIAGNOSIS — E782 Mixed hyperlipidemia: Secondary | ICD-10-CM | POA: Diagnosis not present

## 2022-03-05 DIAGNOSIS — J9601 Acute respiratory failure with hypoxia: Secondary | ICD-10-CM | POA: Diagnosis not present

## 2022-03-05 LAB — BASIC METABOLIC PANEL
Anion gap: 6 (ref 5–15)
BUN: 15 mg/dL (ref 8–23)
CO2: 26 mmol/L (ref 22–32)
Calcium: 9.5 mg/dL (ref 8.9–10.3)
Chloride: 116 mmol/L — ABNORMAL HIGH (ref 98–111)
Creatinine, Ser: 0.65 mg/dL (ref 0.44–1.00)
GFR, Estimated: >60 mL/min (ref >60)
Glucose, Bld: 164 mg/dL — ABNORMAL HIGH (ref 70–99)
Potassium: 3.5 mmol/L (ref 3.5–5.1)
Sodium: 148 mmol/L — ABNORMAL HIGH (ref 135–145)

## 2022-03-05 LAB — CBC
HCT: 29.8 % — ABNORMAL LOW (ref 36.0–46.0)
Hemoglobin: 9.5 g/dL — ABNORMAL LOW (ref 12.0–15.0)
MCH: 31.3 pg (ref 26.0–34.0)
MCHC: 31.9 g/dL (ref 30.0–36.0)
MCV: 98 fL (ref 80.0–100.0)
Platelets: 232 10*3/uL (ref 150–400)
RBC: 3.04 MIL/uL — ABNORMAL LOW (ref 3.87–5.11)
RDW: 14.7 % (ref 11.5–15.5)
WBC: 9.9 10*3/uL (ref 4.0–10.5)
nRBC: 0 % (ref 0.0–0.2)

## 2022-03-05 LAB — GLUCOSE, CAPILLARY
Glucose-Capillary: 183 mg/dL — ABNORMAL HIGH (ref 70–99)
Glucose-Capillary: 150 mg/dL — ABNORMAL HIGH (ref 70–99)
Glucose-Capillary: 150 mg/dL — ABNORMAL HIGH (ref 70–99)

## 2022-03-05 MED ORDER — AMOXICILLIN-POT CLAVULANATE 875-125 MG PO TABS: 1.0000 | ORAL_TABLET | Freq: Two times a day (BID) | ORAL | 0 refills | Status: AC

## 2022-03-05 MED ORDER — GLYCOPYRROLATE 1 MG PO TABS: 1.0000 mg | ORAL_TABLET | Freq: Two times a day (BID) | ORAL | 0 refills | Status: DC

## 2022-03-05 MED ORDER — FLORANEX PO PACK: 1.0000 g | PACK | Freq: Three times a day (TID) | ORAL | 0 refills | Status: DC

## 2022-03-05 NOTE — Care Management Important Message (Signed)
Important Message  Patient Details  Name: Robin Mcclure MRN: [id number] Date of Birth: 03-Aug-1922   Medicare Important Message Given:  Yes     [name] 03/05/2022, 11:51 AM

## 2022-03-05 NOTE — Discharge Summary (Signed)
Physician Discharge Summary   Patient: Robin Mcclure MRN: [id number] DOB: 31-Dec-1922  Admit date:     02/21/2022  Discharge date: 03/05/22  Discharge Physician: Deatra James   PCP: Lujean Amel, MD    Follow-up with the PCP within 1-2 weeks Follow-up with outpatient palliative care within 1 week  Continue current medications -meds need to be adjusted by PCP within 1 week Requiring up to 6 L of oxygen may be tapered down-goal 88-92% saturation   Discharge Diagnoses: Principal Problem:   Acute respiratory failure with hypoxia due to Aspiration PNA Active Problems:   Partial small bowel obstruction (HCC)   Aspiration pneumonia with Hypoxic Resp Failure   Essential hypertension   Gastroesophageal reflux disease   Chronic obstructive pulmonary disease ([place])   Dehydration   Hypoalbuminemia due to protein-calorie malnutrition ([place])   Uncontrolled type 2 diabetes mellitus with hyperglycemia ([place])   Mixed hyperlipidemia   Right leg swelling  Resolved Problems:   * No resolved hospital problems. *  Hospital Course:  Robin Mcclure is a  88 y.o. female with medical history significant of COPD, GERD, hypertension, hyperlipidemia, T2DM admitted on 02/21/2022 with intractable emesis and found to have partial small bowel obstruction  Versus ileus -Unfortunately around 02/24/2022 patient had an aspiration episode     1)PSBO Vs Ileus- -much improved   -- abdominal and chest x-ray from 02/23/22 suggested possible abnormalities of the chest x-ray with regards to vascular structures of the chest--radiologist recommended CT with contrast -CT abdomen and pelvis and CT chest with contrast on 02/23/2022 showed no significant vascular abnormalities of the chest---Shows resolving small bowel obstruction versus ileus--with contrast in the colon -Previously discussed with general surgeon Dr. Constance Haw... She reviewed the images -Conservative management advised possible aspiration episode on  02/24/2022-- --Dulcolax suppository -IV antiemetics as needed --Clinically and radiologically SBO appears to be resolved -Patient had BM, passing gas -NG tube discontinued on 02/26/2022 -Repeat x-rays -resolution of SBO has been recognized   2)Dysphagia--- persistent swallowing difficulties -High risk for aspiration -Speech was consulted, status post evaluation,  Speech   Recommendations:    Diet recommendations: Dysphagia 1 (puree);Nectar-thick liquid Liquids provided via: Cup;Straw Medication Administration: Whole meds with puree Supervision: Patient able to self feed;Staff to assist with self feeding;Intermittent supervision to cue for compensatory strategies Compensations: Slow rate;Small sips/bites;Multiple dry swallows after each bite/sip Postural Changes and/or Swallow Maneuvers: Seated upright 90 degrees;Upright 30-60 min after meal         3)Acute hypoxic respiratory failure due to aspiration pneumonia -Much improved down to 6 L of oxygen, satting 100%   -in the early hours of 02/24/2022 patient developed significant respiratory distress with hypoxia O2 sats down to the 60s -Required deep suctioning, bronchodilators, high flow oxygen Arterial Blood Gas result:  pO2 44; pCO2 48; pH 7.3;  HCO3 23.6, %O2 Sat 76.8 on 8 L of [place] --Weaned off high flow oxygen   C/n  Augmentin started on 02/24/22--for aspiration pneumonia -Continue mucolytics, bronchodilators and supplemental oxygen - speech evaluation appreciated, dysphagia 1/pured diet recommended with thin liquids -NG tube removed on 02/26/2022    -Serial chest x-rays  suggest bilateral infiltrates/aspiration -Leukocytosis -resolved   -Repeat CXR 03/01/2022 IMPRESSION: No changes to aeration compared to previous exam, bilateral pleural effusion     4)HTN- Blood pressure has been soft, continue to improve Hold nifedipine  may use IV Hydralazine 10 mg  Every 4 hours Prn for systolic blood pressure over 160 mmhg    5)HyperNatremia/Hyperchloremia with non-anion gap metabolic  Acidosis-- -improved -Responded to IV fluids, bicarb, stable now  -Off IV fluids     6)Hypophosphatemia/hypokalemia and hypomagnesemia----risk of refeeding syndrome -Potassium and magnesium is normalized     7)DM2-A1c 6.4 reflecting excellent diabetic control PTA -Hold Januvia and metformin -Checking CBG q. ACHS with SSI coverage   8)Social/ethics--- plan of care and advanced directive discussed with patient and patient's daughter [name] -Patient is a full code-Per patient request -Palliative care consulted-patient wishes to remain full code,   9) Sever generalized weakness and deconditioning/ambulatory dysfunction--- get physical therapy eval Recommending SNF placement         Disposition: The patient is from: Home              Anticipated d/c is to: SNF              Anticipated d/c date is: Likely this Monday, 03/05/2022        Barriers:  Clinically Stable-    Code Status :  -  Code Status: Full Code    Consultants: Palliative care team Disposition: Skilled nursing facility Diet recommendation:  Discharge Diet Orders (From admission, onward)     Start     Ordered   03/05/22 0000  Diet - low sodium heart healthy        03/05/22 0732           Cardiac diet DISCHARGE MEDICATION: Allergies as of 03/05/2022   No Known Allergies      Medication List     STOP taking these medications    amoxicillin 500 MG capsule Commonly known as: AMOXIL       TAKE these medications    acetaminophen 650 MG CR tablet Commonly known as: TYLENOL Take 1,300 mg by mouth every 8 (eight) hours as needed for pain.   albuterol 108 (90 Base) MCG/ACT inhaler Commonly known as: VENTOLIN HFA Inhale 1-2 puffs into the lungs every 6 (six) hours as needed for wheezing or shortness of breath.   amoxicillin-clavulanate 875-125 MG tablet Commonly known as: AUGMENTIN Take 1 tablet by mouth every 12 (twelve)  hours for 2 days.   Fluticasone-Salmeterol 500-50 MCG/DOSE Aepb Commonly known as: ADVAIR Inhale 1 puff into the lungs 2 (two) times daily.   furosemide 40 MG tablet Commonly known as: LASIX Take 40 mg by mouth daily.   glycopyrrolate 1 MG tablet Commonly known as: ROBINUL Take 1 tablet (1 mg total) by mouth 2 (two) times daily.   Januvia 100 MG tablet Generic drug: sitaGLIPtin Take 100 mg by mouth daily.   lactobacillus Pack Take 1 packet (1 g total) by mouth 3 (three) times daily with meals for 5 days.   metFORMIN 500 MG tablet Commonly known as: GLUCOPHAGE Take 500 mg by mouth daily.   MiraLax 17 GM/SCOOP powder Generic drug: polyethylene glycol powder See admin instructions.   NIFEdipine 90 MG 24 hr tablet Commonly known as: ADALAT CC Take 90 mg by mouth every morning.   ondansetron 4 MG tablet Commonly known as: ZOFRAN Take 4 mg by mouth every 8 (eight) hours as needed for nausea.   pantoprazole 40 MG tablet Commonly known as: PROTONIX Take 40 mg by mouth every morning.   Pataday 0.1 % ophthalmic solution Generic drug: olopatadine Place 1 drop into both eyes 2 (two) times daily.   Potassium Chloride ER 20 MEQ Tbcr Take 1 tablet by mouth 2 (two) times daily.   pravastatin 20 MG tablet Commonly known as: PRAVACHOL Take 20 mg by mouth every morning.  Contact information for after-discharge care     Destination     [facility] .   Service: Skilled Nursing Contact information: [place] [phone]                    Discharge Exam: Danley Danker Weights   02/26/22 0500 02/27/22 0420 03/02/22 0500  Weight: 58.8 kg 60.8 kg 62.4 kg      Physical Exam:   General:  Chronically ill elderly looking female with generalized cachexia and severe generalized weakness  Requiring up to 6 L of oxygen to maintain O2 sat >96 percent AAO x 3,  cooperative, no distress;   HEENT:  Normocephalic,  PERRL, otherwise with in Normal limits   Neuro:  CNII-XII intact. , normal motor and sensation, reflexes intact   Lungs:   Clear to auscultation BL, Respirations unlabored,  No wheezes / crackles  Cardio:    S1/S2, RRR, No murmure, No Rubs or Gallops   Abdomen:  Soft, non-tender, bowel sounds active all four quadrants, no guarding or peritoneal signs.  Muscular  skeletal:  Limited exam -severe  global generalized weaknesses - in bed, able to move all 4 extremities,   2+ pulses,  symmetric, No pitting edema  Skin:  Dry, warm to touch, negative for any Rashes,  Wounds: Please see nursing documentation          Condition at discharge: stable  The results of significant diagnostics from this hospitalization (including imaging, microbiology, ancillary and laboratory) are listed below for reference.   Imaging Studies: [place] CHEST (PLEURAL EFFUSION)  Result Date: 03/01/2022 CLINICAL DATA:  LEFT pleural effusion EXAM: CHEST ULTRASOUND COMPARISON:  Chest radiograph 03/01/2022 FINDINGS: Small LEFT pleural effusion is identified. Based on patient age/size and condition, this is insufficient for thoracentesis. Atelectasis in LEFT lower lobe. IMPRESSION: Small LEFT pleural effusion insufficient for thoracentesis. Electronically Signed   By: Lavonia Dana M.D.   On: 03/01/2022 12:12   DG CHEST PORT 1 VIEW  Result Date: 03/01/2022 CLINICAL DATA:  Shortness of breath. EXAM: PORTABLE CHEST 1 VIEW COMPARISON:  02/27/2022 FINDINGS: Stable cardiomediastinal contours. Bilateral pleural effusions are noted, left greater than right. Persistent complete retrocardiac opacification of the left lung base. Again noted are patchy bilateral upper lobe and right lung base opacities. These are unchanged compared with the previous exam. IMPRESSION: 1. No change in aeration to the lungs compared with previous exam. 2. Persistent bilateral pleural effusions, left greater than right. Electronically Signed   By: Kerby Moors M.D.   On: 03/01/2022 08:22   DG ABD ACUTE 2+V W 1V CHEST  Result Date: 02/28/2022 CLINICAL DATA:  Shortness of breath. EXAM: DG ABDOMEN ACUTE WITH 1 VIEW CHEST COMPARISON:  Radiographs 02/27/2022 FINDINGS: Progressive pulmonary infiltrates and probable small bilateral pleural effusions. Contrast noted throughout the colon from a recent swallowing function test. No findings for small bowel obstruction or free air. IMPRESSION: 1. Progressive pulmonary infiltrates and probable small bilateral pleural effusions. 2. No acute abdominal findings. Electronically Signed   By: Marijo Sanes M.D.   On: 02/28/2022 14:36   DG SWALLOW FUNC SPEECH PATH  Result Date: 02/27/2022 Table formatting from the original result was not included. Objective Swallowing Evaluation: Type of Study: MBS-Modified Barium Swallow Study  Patient Details Name: Robin Mcclure MRN: [id number] Date of Birth: [birth year] Today's Date: 02/27/2022 Time: SLP Start Time (ACUTE ONLY): 1400 -SLP Stop Time (ACUTE ONLY): 1433 SLP Time Calculation (min) (ACUTE ONLY): 33 min  Past Medical History: Past Medical History: Diagnosis Date  Diabetes mellitus without complication ([place])   Hyperlipidemia   Hypertension  Past Surgical History: Past Surgical History: Procedure Laterality Date  ABDOMINAL HYSTERECTOMY    BIOPSY  01/19/2018  Procedure: BIOPSY;  Surgeon: Ronnette Juniper, MD;  Location[facility] ENDOSCOPY;  Service: Gastroenterology;;  CHOLECYSTECTOMY    ESOPHAGOGASTRODUODENOSCOPY (EGD) WITH PROPOFOL N/A 01/19/2018  Procedure: ESOPHAGOGASTRODUODENOSCOPY (EGD) WITH PROPOFOL;  Surgeon: Ronnette Juniper, MD;  Location: [facility];  Service: Gastroenterology;  Laterality: N/A;  HIP SURGERY   HPI: 88 y.o. female with medical history significant of COPD, GERD, hypertension, hyperlipidemia, T2DM admitted on 02/21/2022 with intractable emesis and found to have partial small bowel obstruction  Versus ileus. in the early hours of 02/24/2022 patient developed significant respiratory  distress with hypoxia O2 sats down to the 60s  -Required deep suctioning, bronchodilators, high flow oxygen  Arterial Blood Gas result:  pO2 44; pCO2 48; pH 7.3;  HCO3 23.6, %O2 Sat 76.8 on 8 L of   -Currently requiring up to 11 L of oxygen via nasal cannula. BSE requested.  Subjective: "I am getting so tired."  Recommendations for follow up therapy are one component of a multi-disciplinary discharge planning process, led by the attending physician.  Recommendations may be updated based on patient status, additional functional criteria and insurance authorization. Assessment / Plan / Recommendation   02/27/2022   4:00 PM Clinical Impressions Clinical Impression Pt presents with mi/mod oropharyngeal and suspected primary esophageal dysphagia characterized by weak lingual manipulation and prolonged oral transit, swallow trigger at the level of the valleculae, decreased tongue base retraction and epiglottic deflection which appeared to be negatively impacted by elongated uvulae that nearly meat the tip of the epiglottis and impaired epiglottic deflection and approximation with posterior pharyngeal wall resulting in reduced laryngeal vestibule closure and penetration of thins during the swallow and penetration/aspiration to the vocal folds (difficult to see if below vocal folds at times) in trace amounts and resulting vallecular residue and occasional contrast behind the uvulae. Epiglottis deflected greater with puree. Nectars only spilled over into the laryngeal vestibule in trace amounts, but also resulted in vallecular residue. Esophageal sweep revealed extensive barium contrast diffusely throughout the esophagus that resulted in some backflow/retrograde movement to the cervical esophagus. Pt still currently requires high supplemental oxygen and fatigues quickly, which increases risk for aspiration and decreases her ability to meet nutritional needs. Pt's daughter indicates that Pt was on nectar thickened liquids  about 1-2 years ago and "did not like it". SLP reviewed imaging with Pt's daughter and suggested that we change her to NTL for now, with plan to advance her once she is clinically stronger. Daughter is willing to try this. Question whether Pt's appearance of elongated uvula could be due to some swelling from recent NG placement? Recommend D1/puree and NTL via cup/straw with pacing strategies due to shortness of breath and fatigue and ensure that Pt is sitting upright for all eating/drinking and remains up for at least 30 minutes after meals due to esophageal dsymotility. SLP will follow during acute stay. SLP Visit Diagnosis Dysphagia, pharyngoesophageal phase (R13.14);Dysphagia, oropharyngeal phase (R13.12) Impact on safety and function Mild aspiration risk;Risk for inadequate nutrition/hydration     02/27/2022   4:00 PM Treatment Recommendations Treatment Recommendations Therapy as outlined in treatment plan below     02/27/2022   4:00 PM Prognosis Prognosis for Safe Diet Advancement Good   02/27/2022   4:00 PM Diet Recommendations SLP Diet Recommendations Dysphagia 1 (Puree) solids;Nectar  thick liquid Liquid Administration via Cup;Straw Medication Administration Whole meds with puree Compensations Slow rate;Small sips/bites;Multiple dry swallows after each bite/sip Postural Changes Remain semi-upright after after feeds/meals (Comment);Seated upright at 90 degrees     02/27/2022   4:00 PM Other Recommendations Recommended Consults Consider esophageal assessment Oral Care Recommendations Oral care BID;Staff/trained caregiver to provide oral care Other Recommendations Clarify dietary restrictions;Order thickener from pharmacy;Prohibited food (jello, ice cream, thin soups) Follow Up Recommendations Skilled nursing-short term rehab (<3 hours/day) Assistance recommended at discharge Frequent or constant Supervision/Assistance Functional Status Assessment Patient has had a recent decline in their functional status and  demonstrates the ability to make significant improvements in function in a reasonable and predictable amount of time.   02/27/2022   4:00 PM Frequency and Duration  Speech Therapy Frequency (ACUTE ONLY) min 2x/week Treatment Duration 1 week     02/27/2022   4:00 PM Oral Phase Oral Phase Impaired Oral - Mech Soft Weak lingual manipulation;Delayed oral transit;Piecemeal swallowing    02/27/2022   4:00 PM Pharyngeal Phase Pharyngeal Phase Impaired Pharyngeal- Nectar Straw Delayed swallow initiation-vallecula;Reduced epiglottic inversion;Penetration/Aspiration during swallow;Pharyngeal residue - valleculae Pharyngeal Material enters airway, remains ABOVE vocal cords then ejected out Pharyngeal- Thin Teaspoon Delayed swallow initiation-vallecula;Reduced epiglottic inversion;Reduced tongue base retraction;Penetration/Aspiration during swallow;Penetration/Apiration after swallow;Pharyngeal residue - valleculae Pharyngeal Material enters airway, CONTACTS cords and not ejected out;Material enters airway, remains ABOVE vocal cords and not ejected out Pharyngeal- Thin Cup Delayed swallow initiation-vallecula;Reduced epiglottic inversion;Reduced airway/laryngeal closure;Reduced tongue base retraction;Penetration/Aspiration during swallow;Penetration/Apiration after swallow;Trace aspiration;Pharyngeal residue - valleculae Pharyngeal Material enters airway, CONTACTS cords and not ejected out Pharyngeal- Thin Straw Delayed swallow initiation-vallecula;Delayed swallow initiation-pyriform sinuses;Reduced tongue base retraction;Reduced epiglottic inversion;Penetration/Aspiration during swallow;Pharyngeal residue - valleculae Pharyngeal Material enters airway, remains ABOVE vocal cords and not ejected out Pharyngeal- Puree Delayed swallow initiation-vallecula;Reduced tongue base retraction;Reduced epiglottic inversion;Pharyngeal residue - valleculae Pharyngeal- Mechanical Soft Reduced epiglottic inversion;Reduced tongue base  retraction;Delayed swallow initiation-vallecula;Pharyngeal residue - valleculae Pharyngeal- Pill WFL;Delayed swallow initiation-vallecula    02/27/2022   4:00 PM Cervical Esophageal Phase  Cervical Esophageal Phase Impaired Puree Esophageal backflow into cervical esophagus Thank you, Genene Churn, DeLisle Baring 02/27/2022, 5:27 PM                     DG CHEST PORT 1 VIEW  Result Date: 02/27/2022 CLINICAL DATA:  Aspiration into airway. EXAM: PORTABLE CHEST 1 VIEW COMPARISON:  02/26/2022 and 02/24/2022 FINDINGS: Increased focal density along the periphery of the right upper lung. Slightly increased airspace densities in the medial aspect of the right upper lobe. Again noted are densities at the left lung base and the left hemidiaphragm is obscured. Findings could represent a combination of pleural fluid and consolidation at the left lung base. Heart size is stable. Trachea is midline. Negative for a pneumothorax. Nasogastric tube has been removed. Surgical clips in the right upper abdomen. IMPRESSION: 1. Increased airspace densities in the right upper lobe. Findings are concerning for pneumonia. 2. Left basilar densities could represent a combination of pleural fluid and airspace disease/consolidation. Electronically Signed   By: Markus Daft M.D.   On: 02/27/2022 08:58   DG ABD ACUTE 2+V W 1V CHEST  Result Date: 02/26/2022 CLINICAL DATA:  Aspiration.  History of small-bowel obstruction. EXAM: DG ABDOMEN ACUTE WITH 1 VIEW CHEST COMPARISON:  Abdominal radiograph 02/25/2022 FINDINGS: Enteric tube appears coiled within the stomach. Stable cardiomegaly and hilar prominence. Similar-appearing left-greater-than-right basilar airspace opacities which may represent atelectasis. Possible trace bilateral pleural effusions. No  pneumothorax. Thoracic spine degenerative changes. Oral contrast material within the descending colon. There are a few gaseous distended loops of small bowel within the central  abdomen. Surgical clips right upper quadrant. Right hip arthroplasty. Lumbar spine degenerative changes. IMPRESSION: 1. A few gaseous distended loops of small bowel within the central abdomen, similar to prior. 2. Bibasilar atelectasis. 3. Oral contrast material within the descending colon. 4. Enteric tube appears coiled within the stomach. Electronically Signed   By: Lovey Newcomer M.D.   On: 02/26/2022 08:08   DG ABD ACUTE 2+V W 1V CHEST  Result Date: 02/25/2022 CLINICAL DATA:  Respiratory failure with hypoxia. Bowel obstruction. EXAM: DG ABDOMEN ACUTE WITH 1 VIEW CHEST COMPARISON:  02/24/2022 FINDINGS: Orogastric tube is seen with tip in the distal antrum or duodenal bulb. Oral contrast material is now seen within nondilated left colon. Decreased small bowel dilatation is seen since prior exam. Contrast noted in the urinary bladder from recent CT. Cardiomegaly is stable. Enlarged pulmonary arteries are consistent with pulmonary arterial hypertension. Increased opacity in both lung bases may be due to atelectasis or infiltrate. No evidence of pleural effusion or pneumothorax. IMPRESSION: Decreased small bowel dilatation since prior exam. Oral contrast material now seen in nondilated left colon. Increased bibasilar atelectasis versus infiltrate. Stable cardiomegaly and pulmonary arterial hypertension. Electronically Signed   By: Marlaine Hind M.D.   On: 02/25/2022 09:16   DG ABD ACUTE 2+V W 1V CHEST  Result Date: 02/24/2022 CLINICAL DATA:  88 year old female with resolving small bowel obstruction on CT yesterday, oral contrast reached the colon. EXAM: DG ABDOMEN ACUTE WITH 1 VIEW CHEST COMPARISON:  CT Chest, Abdomen, and Pelvis yesterday and earlier. FINDINGS: Portable upright AP view of the chest at 0719 hours. Enteric tube terminates in the distal stomach or duodenum, side hole at the level of the gastric body. Stable cholecystectomy clips. No pneumoperitoneum or pneumothorax. Stable lung bases and  mediastinum. Portable upright and supine views the abdomen at 0719 hours. Retained oral contrast appears to be largely within the colon now from the transverse to the sigmoid. Excreted IV contrast in the urinary bladder. Superimposed gas-filled small bowel loops up to 3.5 cm diameter are not significantly changed from the CT yesterday. Right hip arthroplasty.  No acute osseous abnormality identified. IMPRESSION: 1. Stable bowel gas pattern from the CT yesterday. Retained oral contrast now primarily in the colon. No free air. 2. Stable chest. Enteric tube terminates in the distal stomach or duodenum. Electronically Signed   By: Genevie Ann M.D.   On: 02/24/2022 09:21   CT CHEST W CONTRAST  Result Date: 02/23/2022 CLINICAL DATA:  Intractable emesis, partial small bowel obstruction versus ileus, abnormal density right upper chest EXAM: CT CHEST, ABDOMEN, AND PELVIS WITH CONTRAST TECHNIQUE: Multidetector CT imaging of the chest, abdomen and pelvis was performed following the standard protocol during bolus administration of intravenous contrast. RADIATION DOSE REDUCTION: This exam was performed according to the departmental dose-optimization program which includes automated exposure control, adjustment of the mA and/or kV according to patient size and/or use of iterative reconstruction technique. CONTRAST:  60m OMNIPAQUE IOHEXOL 300 MG/ML  SOLN COMPARISON:  02/23/2022, 02/21/2022 FINDINGS: CT CHEST FINDINGS Cardiovascular: Heart is enlarged without pericardial effusion. Extensive atherosclerosis of the coronary vasculature unchanged. No evidence of thoracic aortic aneurysm or dissection. Stable aortic atherosclerosis. Dilated main pulmonary trunk consistent with pulmonary arterial hypertension. Mediastinum/Nodes: Enteric catheter extends into the gastric lumen, tip in the region of the duodenal bulb. Thyroid and trachea are unremarkable. No  pathologic adenopathy. Lungs/Pleura: No airspace disease, effusion, or  pneumothorax. Density in the right upper chest on preceding x-ray likely reflect a superimposed vascular shadow given patient rotation. Central airways are patent. Musculoskeletal: There are no acute displaced fractures. Chronic compression deformities are seen at T4, T5, and T11. Reconstructed images demonstrate no additional findings. CT ABDOMEN PELVIS FINDINGS Hepatobiliary: No focal liver abnormality is seen. Status post cholecystectomy. No biliary dilatation. Pancreas: Pancreas is atrophic. No focal abnormalities or pancreatic duct dilation. Spleen: Normal in size without focal abnormality. Adrenals/Urinary Tract: Kidneys are unremarkable without urinary tract calculi or obstructive uropathy. The adrenals and bladder are grossly unremarkable. Evaluation of the bladder slightly limited due to streak artifact from right hip arthroplasty. Stomach/Bowel: There has been transit of oral contrast into the colon by the time of imaging, excluding high-grade obstruction. There are some residual dilated loops of small bowel within the left mid abdomen, which may be due to resolving obstruction or ileus. No bowel wall thickening or inflammatory change. Vascular/Lymphatic: Aortic atherosclerosis. No enlarged abdominal or pelvic lymph nodes. Reproductive: Status post hysterectomy. No adnexal masses. Other: No free fluid or free intraperitoneal gas. No abdominal wall hernia. Musculoskeletal: There are no acute or destructive bony lesions. Right hip arthroplasty is unremarkable. Reconstructed images demonstrate no additional findings. IMPRESSION: 1. Resolving small-bowel obstruction or ileus, with transit of oral contrast into the colon at the time of imaging. Minimal residual small bowel dilatation in the left mid abdomen. 2. No acute intrathoracic process. The density seen in the right upper chest on preceding x-ray is consistent with superimposed vascular shadows given patient rotation on that exam. 3. Cardiomegaly. 4.  Aortic Atherosclerosis (ICD10-I70.0). Coronary artery atherosclerosis. Electronically Signed   By: Randa Ngo M.D.   On: 02/23/2022 16:45   CT ABDOMEN PELVIS W CONTRAST  Result Date: 02/23/2022 CLINICAL DATA:  Intractable emesis, partial small bowel obstruction versus ileus, abnormal density right upper chest EXAM: CT CHEST, ABDOMEN, AND PELVIS WITH CONTRAST TECHNIQUE: Multidetector CT imaging of the chest, abdomen and pelvis was performed following the standard protocol during bolus administration of intravenous contrast. RADIATION DOSE REDUCTION: This exam was performed according to the departmental dose-optimization program which includes automated exposure control, adjustment of the mA and/or kV according to patient size and/or use of iterative reconstruction technique. CONTRAST:  2m OMNIPAQUE IOHEXOL 300 MG/ML  SOLN COMPARISON:  02/23/2022, 02/21/2022 FINDINGS: CT CHEST FINDINGS Cardiovascular: Heart is enlarged without pericardial effusion. Extensive atherosclerosis of the coronary vasculature unchanged. No evidence of thoracic aortic aneurysm or dissection. Stable aortic atherosclerosis. Dilated main pulmonary trunk consistent with pulmonary arterial hypertension. Mediastinum/Nodes: Enteric catheter extends into the gastric lumen, tip in the region of the duodenal bulb. Thyroid and trachea are unremarkable. No pathologic adenopathy. Lungs/Pleura: No airspace disease, effusion, or pneumothorax. Density in the right upper chest on preceding x-ray likely reflect a superimposed vascular shadow given patient rotation. Central airways are patent. Musculoskeletal: There are no acute displaced fractures. Chronic compression deformities are seen at T4, T5, and T11. Reconstructed images demonstrate no additional findings. CT ABDOMEN PELVIS FINDINGS Hepatobiliary: No focal liver abnormality is seen. Status post cholecystectomy. No biliary dilatation. Pancreas: Pancreas is atrophic. No focal abnormalities or  pancreatic duct dilation. Spleen: Normal in size without focal abnormality. Adrenals/Urinary Tract: Kidneys are unremarkable without urinary tract calculi or obstructive uropathy. The adrenals and bladder are grossly unremarkable. Evaluation of the bladder slightly limited due to streak artifact from right hip arthroplasty. Stomach/Bowel: There has been transit of oral contrast into the  colon by the time of imaging, excluding high-grade obstruction. There are some residual dilated loops of small bowel within the left mid abdomen, which may be due to resolving obstruction or ileus. No bowel wall thickening or inflammatory change. Vascular/Lymphatic: Aortic atherosclerosis. No enlarged abdominal or pelvic lymph nodes. Reproductive: Status post hysterectomy. No adnexal masses. Other: No free fluid or free intraperitoneal gas. No abdominal wall hernia. Musculoskeletal: There are no acute or destructive bony lesions. Right hip arthroplasty is unremarkable. Reconstructed images demonstrate no additional findings. IMPRESSION: 1. Resolving small-bowel obstruction or ileus, with transit of oral contrast into the colon at the time of imaging. Minimal residual small bowel dilatation in the left mid abdomen. 2. No acute intrathoracic process. The density seen in the right upper chest on preceding x-ray is consistent with superimposed vascular shadows given patient rotation on that exam. 3. Cardiomegaly. 4. Aortic Atherosclerosis (ICD10-I70.0). Coronary artery atherosclerosis. Electronically Signed   By: Randa Ngo M.D.   On: 02/23/2022 16:45   DG ABD ACUTE 2+V W 1V CHEST  Result Date: 02/23/2022 CLINICAL DATA:  Small-bowel obstruction. EXAM: DG ABDOMEN ACUTE WITH 1 VIEW CHEST COMPARISON:  February 21, 2022 FINDINGS: Enteric tube courses into the abdomen. Tip likely in the area of the proximal duodenum. Trachea midline. Cardiomediastinal contours and hilar structures are stable with signs of cardiomegaly. No consolidation.  Subtle added density over the RIGHT upper chest. This is about the third and fourth ribs posteriorly and the clavicle, an area of significant overlap but is asymmetric to the other side. No pneumothorax. No free air beneath either the RIGHT or LEFT hemidiaphragm. Scattered loops of gas-filled bowel throughout the abdomen. Degree of distension slightly improved compared to previous imaging still with some mild to moderate distension of bowel loops in the LEFT mid abdomen. Post partial colonic resection as before. Stool and gas scattered throughout the colon with gas in the rectum. Degenerative changes throughout the spine. RIGHT hip arthroplasty. Osteopenia. IMPRESSION: 1. Enteric tube courses into the abdomen. Tip likely in the area of the proximal duodenum. 2. Scattered loops of gas-filled bowel throughout the abdomen. Degree of distension slightly improved compared to previous imaging still with some mild to moderate distension of bowel loops in the LEFT mid abdomen. 3. No free air. 4. Added density over the RIGHT upper chest, image slightly rotated and could project vascular structures over the upper chest. Density is however more than expected just above the RIGHT hilum and underlying nodule up to 2.3 cm is considered. Given nonstandard positioning currently would suggest CT of the chest for further evaluation on follow-up. Electronically Signed   By: Zetta Bills M.D.   On: 02/23/2022 08:12   US Venous Img Lower Unilateral Right (DVT)  Result Date: 02/22/2022 CLINICAL DATA:  Right leg swelling EXAM: RIGHT LOWER EXTREMITY VENOUS DOPPLER ULTRASOUND TECHNIQUE: Gray-scale sonography with compression, as well as color and duplex ultrasound, were performed to evaluate the deep venous system(s) from the level of the common femoral vein through the popliteal and proximal calf veins. COMPARISON:  None available FINDINGS: VENOUS Normal compressibility of the common femoral, superficial femoral, and popliteal veins,  as well as the visualized calf veins. Visualized portions of profunda femoral vein and great saphenous vein unremarkable. No filling defects to suggest DVT on grayscale or color Doppler imaging. Doppler waveforms show normal direction of venous flow, normal respiratory plasticity and response to augmentation. Limited views of the contralateral common femoral vein are unremarkable. OTHER None. Limitations: none IMPRESSION: No right lower  extremity DVT. Electronically Signed   By: Miachel Roux M.D.   On: 02/22/2022 13:22   DG Chest Portable 1 View  Result Date: 02/21/2022 CLINICAL DATA:  Enteric catheter placement EXAM: PORTABLE CHEST 1 VIEW COMPARISON:  08/18/2020 FINDINGS: Single frontal view of the chest demonstrates enteric catheter passing below diaphragm, tip excluded by collimation. Side port projects over the gastric fundus. Cardiac silhouette is enlarged but stable. No airspace disease, effusion, or pneumothorax. No acute bony abnormality. IMPRESSION: 1. Enteric catheter coiled over the stomach, tip excluded by collimation. The side port projects over the gastric fundus. Electronically Signed   By: Randa Ngo M.D.   On: 02/21/2022 20:01   DG Abd Portable 1 View  Result Date: 02/21/2022 CLINICAL DATA:  Enteric catheter placement EXAM: PORTABLE ABDOMEN - 1 VIEW COMPARISON:  02/21/2022 FINDINGS: Frontal view of the lower chest and upper abdomen demonstrates enteric catheter passing below diaphragm, looped back upon itself with tip and side port extending retrograde in the region of the distal thoracic esophagus. Recommend removal and replacement. Lung bases are clear. Gaseous distention of the small bowel consistent with obstruction as seen on recent CT. IMPRESSION: 1. Enteric catheter as above, coiled back upon itself with tip projecting over the distal thoracic esophagus. Recommend removal and replacement. 2. Small bowel obstruction. Electronically Signed   By: Randa Ngo M.D.   On: 02/21/2022  18:34   CT Abdomen Pelvis W Contrast  Result Date: 02/21/2022 CLINICAL DATA:  Difficulty urinating. EXAM: CT ABDOMEN AND PELVIS WITH CONTRAST TECHNIQUE: Multidetector CT imaging of the abdomen and pelvis was performed using the standard protocol following bolus administration of intravenous contrast. RADIATION DOSE REDUCTION: This exam was performed according to the departmental dose-optimization program which includes automated exposure control, adjustment of the mA and/or kV according to patient size and/or use of iterative reconstruction technique. CONTRAST:  196m OMNIPAQUE IOHEXOL 300 MG/ML  SOLN COMPARISON:  None Available. FINDINGS: Lower chest: No acute abnormality. Hepatobiliary: No focal liver abnormality is seen. Status post cholecystectomy. No biliary dilatation. Pancreas: Unremarkable. No pancreatic ductal dilatation or surrounding inflammatory changes. Spleen: Normal in size without focal abnormality. Adrenals/Urinary Tract: Adrenal glands are unremarkable. Kidneys are normal in size with mild areas of renal cortical thinning noted. There is no evidence of renal calculi or hydronephrosis. Bladder is unremarkable. Stomach/Bowel: Stomach is within normal limits. The appendix is not identified. Multiple dilated small bowel loops are seen throughout the abdomen and pelvis (maximum small bowel diameter of approximately 3.8 cm). A gradual transition zone is seen within the lateral aspect of the mid to lower right abdomen. Numerous noninflamed diverticula are seen throughout the sigmoid colon. Vascular/Lymphatic: Aortic atherosclerosis. No enlarged abdominal or pelvic lymph nodes. Reproductive: Status post hysterectomy. No adnexal masses. Other: No abdominal wall hernia or abnormality. No abdominopelvic ascites. Musculoskeletal: A total right hip replacement is seen with a marked amount of associated streak artifact. Subsequently limited evaluation of the adjacent osseous and soft tissue structures is  noted. A chronic compression fracture deformity is seen at the level of T11. Multilevel degenerative changes are seen throughout the remainder of the lumbar spine. IMPRESSION: 1. Findings consistent with a partial small bowel obstruction versus ileus. 2. Sigmoid diverticulosis. 3. Total right hip replacement. 4. Chronic compression fracture deformity at the level of T11. 5. Aortic atherosclerosis. Aortic Atherosclerosis (ICD10-I70.0). Electronically Signed   By: TVirgina NorfolkM.D.   On: 02/21/2022 17:12    Microbiology: Results for orders placed or performed during the hospital encounter of  02/21/22  MRSA Next Gen by PCR, Nasal     Status: None   Collection Time: 02/24/22  2:30 PM   Specimen: Nasal Mucosa; Nasal Swab  Result Value Ref Range Status   MRSA by PCR Next Gen NOT DETECTED NOT DETECTED Final    Comment: (NOTE) The GeneXpert MRSA Assay (FDA approved for NASAL specimens only), is one component of a comprehensive MRSA colonization surveillance program. It is not intended to diagnose MRSA infection nor to guide or monitor treatment for MRSA infections. Test performance is not FDA approved in patients less than 79 years old. Performed at [facility], [address] [place], [place] [address]     Labs: CBC: Recent Labs  Lab 03/01/22 0420 03/02/22 0422 03/03/22 0422 03/04/22 0421 03/05/22 0354  WBC 10.1 13.4* 12.7* 11.9* 9.9  HGB 9.9* 10.3* 10.3* 10.7* 9.5*  HCT 30.4* 31.9* 31.9* 32.8* 29.8*  MCV 96.2 98.8 96.4 95.9 98.0  PLT 205 222 229 245 465   Basic Metabolic Panel: Recent Labs  Lab 02/27/22 0434 02/28/22 0453 03/01/22 0420 03/02/22 0609 03/03/22 0422 03/04/22 0421 03/05/22 0354  NA 142 139 142 145 145 147* 148*  K 4.0 4.0 3.9 3.8 3.6 3.5 3.5  CL 114* 111 114* 116* 114* 116* 116*  CO2 19* 21* '22 23 24 23 26  '$ GLUCOSE 205* 269* 201* 215* 187* 200* 164*  BUN 30* 26* '21 21 19 17 15  '$ CREATININE 0.83 0.70 0.70 0.61 0.63 0.64 0.65  CALCIUM 9.2 8.8* 8.9 9.3 9.1  9.6 9.5  MG 1.7  --   --   --   --   --   --   PHOS 1.3* 2.4*  --   --   --   --   --    Liver Function Tests: Recent Labs  Lab 02/27/22 0434 02/28/22 0453  ALBUMIN 2.6* 2.5*   CBG: Recent Labs  Lab 03/04/22 0929 03/04/22 1155 03/04/22 1627 03/04/22 2011 03/05/22 0732  GLUCAP 208* 198* 143* 135* 183*    Discharge time spent: greater than 30 minutes.  Signed: Deatra James, MD [facility] 03/05/2022

## 2022-03-05 NOTE — Progress Notes (Signed)
Attempted to call report to [facility] [phone], left message to return call.

## 2022-03-05 NOTE — TOC Transition Note (Signed)
Transition of Care [facility]) - CM/SW Discharge Note   Patient Details  Name: Robin Mcclure MRN: [id number] Date of Birth: [birth year]  [facility] CM/SW Contact:  Shade Flood, LCSW Phone Number: 03/05/2022, 11:08 AM   Clinical Narrative:     Pt stable for dc to SNF today per MD. Spoke with pt's daughter to review SNF bed offers again. [name] states that she would like for pt to go to [facility]. Spoke with Melissa at [facility] to update. They can accept pt today.  DC clinical sent electronically. RN to call report. EMS arranged.  No other TOC needs for dc.  Final next level of care: Skilled Nursing Facility Barriers to Discharge: Barriers Resolved   Patient Goals and CMS Choice Patient states their goals for this hospitalization and ongoing recovery are:: go to SNF CMS Medicare.gov Compare Post Acute Care list provided to:: Patient Represenative (must comment) Choice offered to / list presented to : Patient, Adult Children  Discharge Placement              Patient chooses bed at: [facility] Patient to be transferred to facility by: EMS Name of family member notified: [name] Patient and family notified of of transfer: 03/05/22  Discharge Plan and Services In-house Referral: Clinical Social Work Discharge Planning Services: CM Consult Post Acute Care Choice: [place]                               Social Determinants of Health (SDOH) Interventions Housing Interventions: Intervention Not Indicated   Readmission Risk Interventions    08/22/2020    1:40 PM  Readmission Risk Prevention Plan  Medication Screening Complete  Transportation Screening Complete

## 2022-03-05 NOTE — Progress Notes (Signed)
Report given to [facility]. Patient discharged via [facility]. Daughter at bedside, aware.

## 2022-03-06 DIAGNOSIS — J690 Pneumonitis due to inhalation of food and vomit: Secondary | ICD-10-CM | POA: Diagnosis not present

## 2022-03-06 DIAGNOSIS — J449 Chronic obstructive pulmonary disease, unspecified: Secondary | ICD-10-CM | POA: Diagnosis not present

## 2022-03-06 DIAGNOSIS — N139 Obstructive and reflux uropathy, unspecified: Secondary | ICD-10-CM | POA: Diagnosis not present

## 2022-03-06 DIAGNOSIS — Z7189 Other specified counseling: Secondary | ICD-10-CM | POA: Diagnosis not present

## 2022-03-06 DIAGNOSIS — K56600 Partial intestinal obstruction, unspecified as to cause: Secondary | ICD-10-CM | POA: Diagnosis not present

## 2022-03-06 DIAGNOSIS — J9601 Acute respiratory failure with hypoxia: Secondary | ICD-10-CM | POA: Diagnosis not present

## 2022-03-06 DIAGNOSIS — R1310 Dysphagia, unspecified: Secondary | ICD-10-CM | POA: Diagnosis not present

## 2022-03-06 DIAGNOSIS — Z978 Presence of other specified devices: Secondary | ICD-10-CM | POA: Diagnosis not present

## 2022-03-07 DIAGNOSIS — E119 Type 2 diabetes mellitus without complications: Secondary | ICD-10-CM | POA: Diagnosis not present

## 2022-03-07 DIAGNOSIS — J449 Chronic obstructive pulmonary disease, unspecified: Secondary | ICD-10-CM | POA: Diagnosis not present

## 2022-03-07 DIAGNOSIS — E46 Unspecified protein-calorie malnutrition: Secondary | ICD-10-CM | POA: Diagnosis not present

## 2022-03-07 DIAGNOSIS — J690 Pneumonitis due to inhalation of food and vomit: Secondary | ICD-10-CM | POA: Diagnosis not present

## 2022-03-07 DIAGNOSIS — E8809 Other disorders of plasma-protein metabolism, not elsewhere classified: Secondary | ICD-10-CM | POA: Diagnosis not present

## 2022-03-07 DIAGNOSIS — K56600 Partial intestinal obstruction, unspecified as to cause: Secondary | ICD-10-CM | POA: Diagnosis not present

## 2022-03-07 DIAGNOSIS — M7989 Other specified soft tissue disorders: Secondary | ICD-10-CM | POA: Diagnosis not present

## 2022-03-07 DIAGNOSIS — E785 Hyperlipidemia, unspecified: Secondary | ICD-10-CM | POA: Diagnosis not present

## 2022-03-07 DIAGNOSIS — K219 Gastro-esophageal reflux disease without esophagitis: Secondary | ICD-10-CM | POA: Diagnosis not present

## 2022-03-07 DIAGNOSIS — J9601 Acute respiratory failure with hypoxia: Secondary | ICD-10-CM | POA: Diagnosis not present

## 2022-03-07 DIAGNOSIS — I10 Essential (primary) hypertension: Secondary | ICD-10-CM | POA: Diagnosis not present

## 2022-03-07 DIAGNOSIS — N139 Obstructive and reflux uropathy, unspecified: Secondary | ICD-10-CM | POA: Diagnosis not present

## 2022-03-08 ENCOUNTER — Encounter (HOSPITAL_COMMUNITY): Payer: Self-pay | Admitting: *Deleted

## 2022-03-08 ENCOUNTER — Inpatient Hospital Stay (HOSPITAL_COMMUNITY)
Admission: EM | Admit: 2022-03-08 | Discharge: 2022-03-13 | DRG: 871 | Disposition: A | Discharge status: Skilled Nursing Facility | Payer: MEDICARE | Source: Skilled Nursing Facility | Attending: Internal Medicine | Admitting: Internal Medicine

## 2022-03-08 ENCOUNTER — Other Ambulatory Visit: Payer: Self-pay

## 2022-03-08 ENCOUNTER — Emergency Department (HOSPITAL_COMMUNITY): Payer: MEDICARE

## 2022-03-08 DIAGNOSIS — E8809 Other disorders of plasma-protein metabolism, not elsewhere classified: Secondary | ICD-10-CM | POA: Diagnosis not present

## 2022-03-08 DIAGNOSIS — J90 Pleural effusion, not elsewhere classified: Secondary | ICD-10-CM | POA: Diagnosis present

## 2022-03-08 DIAGNOSIS — D649 Anemia, unspecified: Secondary | ICD-10-CM | POA: Diagnosis not present

## 2022-03-08 DIAGNOSIS — Z66 Do not resuscitate: Secondary | ICD-10-CM | POA: Diagnosis present

## 2022-03-08 DIAGNOSIS — R6520 Severe sepsis without septic shock: Secondary | ICD-10-CM | POA: Diagnosis present

## 2022-03-08 DIAGNOSIS — I959 Hypotension, unspecified: Secondary | ICD-10-CM | POA: Diagnosis not present

## 2022-03-08 DIAGNOSIS — I469 Cardiac arrest, cause unspecified: Secondary | ICD-10-CM | POA: Diagnosis not present

## 2022-03-08 DIAGNOSIS — Z20822 Contact with and (suspected) exposure to covid-19: Secondary | ICD-10-CM | POA: Diagnosis present

## 2022-03-08 DIAGNOSIS — Z79899 Other long term (current) drug therapy: Secondary | ICD-10-CM | POA: Diagnosis not present

## 2022-03-08 DIAGNOSIS — Z7951 Long term (current) use of inhaled steroids: Secondary | ICD-10-CM

## 2022-03-08 DIAGNOSIS — J9601 Acute respiratory failure with hypoxia: Secondary | ICD-10-CM | POA: Diagnosis present

## 2022-03-08 DIAGNOSIS — Z7401 Bed confinement status: Secondary | ICD-10-CM | POA: Diagnosis not present

## 2022-03-08 DIAGNOSIS — I4891 Unspecified atrial fibrillation: Secondary | ICD-10-CM | POA: Diagnosis not present

## 2022-03-08 DIAGNOSIS — K5730 Diverticulosis of large intestine without perforation or abscess without bleeding: Secondary | ICD-10-CM | POA: Diagnosis not present

## 2022-03-08 DIAGNOSIS — R1310 Dysphagia, unspecified: Secondary | ICD-10-CM | POA: Diagnosis not present

## 2022-03-08 DIAGNOSIS — E441 Mild protein-calorie malnutrition: Secondary | ICD-10-CM | POA: Diagnosis not present

## 2022-03-08 DIAGNOSIS — S22089D Unspecified fracture of T11-T12 vertebra, subsequent encounter for fracture with routine healing: Secondary | ICD-10-CM | POA: Diagnosis not present

## 2022-03-08 DIAGNOSIS — I700 Atherosclerosis of aorta: Secondary | ICD-10-CM | POA: Diagnosis not present

## 2022-03-08 DIAGNOSIS — I10 Essential (primary) hypertension: Secondary | ICD-10-CM | POA: Diagnosis present

## 2022-03-08 DIAGNOSIS — J690 Pneumonitis due to inhalation of food and vomit: Secondary | ICD-10-CM | POA: Diagnosis not present

## 2022-03-08 DIAGNOSIS — L89153 Pressure ulcer of sacral region, stage 3: Secondary | ICD-10-CM | POA: Diagnosis not present

## 2022-03-08 DIAGNOSIS — Z7189 Other specified counseling: Secondary | ICD-10-CM | POA: Diagnosis not present

## 2022-03-08 DIAGNOSIS — E782 Mixed hyperlipidemia: Secondary | ICD-10-CM | POA: Diagnosis present

## 2022-03-08 DIAGNOSIS — F419 Anxiety disorder, unspecified: Secondary | ICD-10-CM | POA: Diagnosis not present

## 2022-03-08 DIAGNOSIS — E46 Unspecified protein-calorie malnutrition: Secondary | ICD-10-CM

## 2022-03-08 DIAGNOSIS — E1165 Type 2 diabetes mellitus with hyperglycemia: Secondary | ICD-10-CM | POA: Diagnosis present

## 2022-03-08 DIAGNOSIS — R0603 Acute respiratory distress: Secondary | ICD-10-CM | POA: Diagnosis not present

## 2022-03-08 DIAGNOSIS — R739 Hyperglycemia, unspecified: Secondary | ICD-10-CM | POA: Diagnosis not present

## 2022-03-08 DIAGNOSIS — R0602 Shortness of breath: Secondary | ICD-10-CM | POA: Diagnosis not present

## 2022-03-08 DIAGNOSIS — I2489 Other forms of acute ischemic heart disease: Secondary | ICD-10-CM | POA: Diagnosis present

## 2022-03-08 DIAGNOSIS — Z7984 Long term (current) use of oral hypoglycemic drugs: Secondary | ICD-10-CM

## 2022-03-08 DIAGNOSIS — K219 Gastro-esophageal reflux disease without esophagitis: Secondary | ICD-10-CM | POA: Diagnosis present

## 2022-03-08 DIAGNOSIS — K56600 Partial intestinal obstruction, unspecified as to cause: Secondary | ICD-10-CM | POA: Diagnosis not present

## 2022-03-08 DIAGNOSIS — G9340 Encephalopathy, unspecified: Secondary | ICD-10-CM | POA: Diagnosis present

## 2022-03-08 DIAGNOSIS — A419 Sepsis, unspecified organism: Secondary | ICD-10-CM | POA: Diagnosis present

## 2022-03-08 DIAGNOSIS — Z90710 Acquired absence of both cervix and uterus: Secondary | ICD-10-CM

## 2022-03-08 DIAGNOSIS — R52 Pain, unspecified: Secondary | ICD-10-CM | POA: Diagnosis not present

## 2022-03-08 DIAGNOSIS — E8770 Fluid overload, unspecified: Secondary | ICD-10-CM | POA: Diagnosis not present

## 2022-03-08 DIAGNOSIS — J449 Chronic obstructive pulmonary disease, unspecified: Secondary | ICD-10-CM | POA: Diagnosis present

## 2022-03-08 DIAGNOSIS — Z6821 Body mass index (BMI) 21.0-21.9, adult: Secondary | ICD-10-CM

## 2022-03-08 DIAGNOSIS — R55 Syncope and collapse: Secondary | ICD-10-CM | POA: Diagnosis not present

## 2022-03-08 DIAGNOSIS — R069 Unspecified abnormalities of breathing: Secondary | ICD-10-CM | POA: Diagnosis not present

## 2022-03-08 DIAGNOSIS — Z515 Encounter for palliative care: Secondary | ICD-10-CM | POA: Diagnosis not present

## 2022-03-08 DIAGNOSIS — E870 Hyperosmolality and hypernatremia: Secondary | ICD-10-CM | POA: Diagnosis present

## 2022-03-08 DIAGNOSIS — J9811 Atelectasis: Secondary | ICD-10-CM | POA: Diagnosis not present

## 2022-03-08 HISTORY — DX: Gastro-esophageal reflux disease without esophagitis: K21.9

## 2022-03-08 HISTORY — DX: Chronic obstructive pulmonary disease, unspecified: J44.9

## 2022-03-08 HISTORY — DX: Dysphagia, unspecified: R13.10

## 2022-03-08 LAB — COMPREHENSIVE METABOLIC PANEL
ALT: 14 U/L (ref 0–44)
AST: 14 U/L — ABNORMAL LOW (ref 15–41)
Albumin: 2.5 g/dL — ABNORMAL LOW (ref 3.5–5.0)
Alkaline Phosphatase: 76 U/L (ref 38–126)
Anion gap: 11 (ref 5–15)
BUN: 23 mg/dL (ref 8–23)
CO2: 25 mmol/L (ref 22–32)
Calcium: 9 mg/dL (ref 8.9–10.3)
Chloride: 113 mmol/L — ABNORMAL HIGH (ref 98–111)
Creatinine, Ser: 1.01 mg/dL — ABNORMAL HIGH (ref 0.44–1.00)
GFR, Estimated: 51 mL/min — ABNORMAL LOW (ref >60)
Glucose, Bld: 250 mg/dL — ABNORMAL HIGH (ref 70–99)
Potassium: 3.9 mmol/L (ref 3.5–5.1)
Sodium: 149 mmol/L — ABNORMAL HIGH (ref 135–145)
Total Bilirubin: 0.7 mg/dL (ref 0.3–1.2)
Total Protein: 7 g/dL (ref 6.5–8.1)

## 2022-03-08 LAB — CBC WITH DIFFERENTIAL/PLATELET
Abs Immature Granulocytes: 0.25 10*3/uL — ABNORMAL HIGH (ref 0.00–0.07)
Basophils Absolute: 0.1 10*3/uL (ref 0.0–0.1)
Basophils Relative: 0 %
Eosinophils Absolute: 0 10*3/uL (ref 0.0–0.5)
Eosinophils Relative: 0 %
HCT: 35.3 % — ABNORMAL LOW (ref 36.0–46.0)
Hemoglobin: 11 g/dL — ABNORMAL LOW (ref 12.0–15.0)
Immature Granulocytes: 1 %
Lymphocytes Relative: 3 %
Lymphs Abs: 1 10*3/uL (ref 0.7–4.0)
MCH: 30.9 pg (ref 26.0–34.0)
MCHC: 31.2 g/dL (ref 30.0–36.0)
MCV: 99.2 fL (ref 80.0–100.0)
Monocytes Absolute: 1.6 10*3/uL — ABNORMAL HIGH (ref 0.1–1.0)
Monocytes Relative: 5 %
Neutro Abs: 29.8 10*3/uL — ABNORMAL HIGH (ref 1.7–7.7)
Neutrophils Relative %: 91 %
Platelets: 261 10*3/uL (ref 150–400)
RBC: 3.56 MIL/uL — ABNORMAL LOW (ref 3.87–5.11)
RDW: 15 % (ref 11.5–15.5)
WBC: 32.8 10*3/uL — ABNORMAL HIGH (ref 4.0–10.5)
nRBC: 0 % (ref 0.0–0.2)

## 2022-03-08 LAB — TROPONIN I (HIGH SENSITIVITY)
Troponin I (High Sensitivity): 120 ng/L (ref <18)
Troponin I (High Sensitivity): 114 ng/L (ref <18)

## 2022-03-08 LAB — SARS CORONAVIRUS 2 BY RT PCR: SARS Coronavirus 2 by RT PCR: NEGATIVE

## 2022-03-08 LAB — LIPASE, BLOOD: Lipase: 23 U/L (ref 11–51)

## 2022-03-08 LAB — CBG MONITORING, ED: Glucose-Capillary: 218 mg/dL — ABNORMAL HIGH (ref 70–99)

## 2022-03-08 MED ORDER — VANCOMYCIN HCL 1500 MG/300ML IV SOLN
1500.0000 mg | Freq: Once | INTRAVENOUS | Status: AC
  Administered 2022-03-08: 1500 mg via INTRAVENOUS
  Filled 2022-03-08: qty 300

## 2022-03-08 MED ORDER — IOHEXOL 350 MG/ML SOLN
75.0000 mL | Freq: Once | INTRAVENOUS | Status: AC | PRN
  Administered 2022-03-08: 75 mL via INTRAVENOUS

## 2022-03-08 MED ORDER — ACETAMINOPHEN 500 MG PO TABS: 1000.0000 mg | ORAL_TABLET | Freq: Once | ORAL | Status: DC

## 2022-03-08 MED ORDER — SODIUM CHLORIDE 0.9 % IV SOLN
1.0000 g | Freq: Once | INTRAVENOUS | Status: DC
  Filled 2022-03-08: qty 10

## 2022-03-08 MED ORDER — LACTATED RINGERS IV BOLUS
1000.0000 mL | Freq: Once | INTRAVENOUS | Status: AC
  Administered 2022-03-08: 1000 mL via INTRAVENOUS

## 2022-03-08 MED ORDER — SODIUM CHLORIDE 0.9 % IV SOLN
2.0000 g | Freq: Once | INTRAVENOUS | Status: AC
  Administered 2022-03-08: 2 g via INTRAVENOUS
  Filled 2022-03-08: qty 12.5

## 2022-03-08 NOTE — ED Triage Notes (Signed)
Pt brought in by [facility] from [facility] with c/o syncopal episode with period of unresponsiveness and low O2 sat of 76% on RA afterwards. EMS arrived and placed pt on NRB and within a few minutes she was responsive to painful stimuli. About 10 minutes later on the ambulance she became alert and responsive. O2 sat up to 90% on NRB for EMS. Vitals for EMS - HR 87-120, BP 124/73, CBG 117.

## 2022-03-08 NOTE — ED Provider Notes (Signed)
[facility] Provider Note   CSN: [id number] Arrival date & time: 03/08/22  1720     History Chief Complaint  Patient presents with   Loss of Consciousness    HPI Robin Mcclure is a 88 y.o. female presenting for syncopal episode.  She is a 88 year old female who unfortunately has multiple comorbid medical problems.  She syncopized today at the nursing home while she was getting a breathing treatment.  On nonrebreather with EMS.  Per daughter: CXR earlier today with worsening findings. Sats 70% throughout the day today. She was confused. Poor PO intake  Patient's recorded medical, surgical, social, medication list and allergies were reviewed in the Snapshot window as part of the initial history.   Review of Systems   Review of Systems  Constitutional:  Positive for fever. Negative for chills.  HENT:  Negative for ear pain and sore throat.   Eyes:  Negative for pain and visual disturbance.  Respiratory:  Positive for cough and shortness of breath.   Cardiovascular:  Negative for chest pain and palpitations.  Gastrointestinal:  Negative for abdominal pain and vomiting.  Genitourinary:  Negative for dysuria and hematuria.  Musculoskeletal:  Negative for arthralgias and back pain.  Skin:  Negative for color change and rash.  Neurological:  Negative for seizures and syncope.  Psychiatric/Behavioral:  Positive for confusion.   All other systems reviewed and are negative.   Physical Exam Updated Vital Signs BP (!) 117/57   Pulse 99   Temp (!) 100.9 F (38.3 C) (Axillary)   Resp (!) 24   Ht '5\' 5"'$  (1.651 m)   Wt 62.4 kg   SpO2 97%   BMI 22.89 kg/m  Physical Exam Vitals and nursing note reviewed.  Constitutional:      General: She is not in acute distress.    Appearance: She is well-developed.  HENT:     Head: Normocephalic and atraumatic.  Eyes:     Conjunctiva/sclera: Conjunctivae normal.  Cardiovascular:     Rate and Rhythm: Regular rhythm.  Tachycardia present.     Heart sounds: No murmur heard. Pulmonary:     Effort: Respiratory distress present.     Breath sounds: Rhonchi present.  Abdominal:     Palpations: Abdomen is soft.     Tenderness: There is no abdominal tenderness.  Musculoskeletal:        General: No swelling.     Cervical back: Neck supple.  Skin:    General: Skin is warm and dry.     Capillary Refill: Capillary refill takes less than 2 seconds.  Neurological:     Mental Status: She is alert.  Psychiatric:        Mood and Affect: Mood normal.      ED Course/ Medical Decision Making/ A&P Clinical Course as of 03/08/22 2234  Thu Mar 08, 2022  2137 CTA and admit [CC]    Clinical Course User Index [CC] Tretha Sciara, MD    Procedures .Critical Care  Performed by: Tretha Sciara, MD Authorized by: Tretha Sciara, MD   Critical care provider statement:    Critical care time (minutes):  30   Critical care was necessary to treat or prevent imminent or life-threatening deterioration of the following conditions:  Respiratory failure and sepsis   Critical care was time spent personally by me on the following activities:  Development of treatment plan with patient or surrogate, discussions with consultants, evaluation of patient's response to treatment, examination of patient, ordering and review of  laboratory studies, ordering and review of radiographic studies, ordering and performing treatments and interventions, pulse oximetry, re-evaluation of patient's condition and review of old charts    Medications Ordered in ED Medications  lactated ringers bolus 1,000 mL (has no administration in time range)  acetaminophen (TYLENOL) tablet 1,000 mg (has no administration in time range)  vancomycin (VANCOREADY) IVPB 1500 mg/300 mL (1,500 mg Intravenous New Bag/Given 03/08/22 1954)  ceFEPIme (MAXIPIME) 2 g in sodium chloride 0.9 % 100 mL IVPB (0 g Intravenous Stopped 03/08/22 1952)  iohexol (OMNIPAQUE)  350 MG/ML injection 75 mL (75 mLs Intravenous Contrast Given 03/08/22 2044)    Medical Decision Making:    Robin Mcclure is a 88 y.o. female who presented to the ED today with altered mental status, shortness of breath, syncope detailed above.     Patient's presentation is complicated by their history of multiple comorbid medical problems including advanced age.  Patient placed on continuous vitals and telemetry monitoring while in ED which was reviewed periodically.   Complete initial physical exam performed, notably the patient  was tachypneic, tachycardic, febrile concerning for development of sepsis in the setting of known pneumonia.  Patient grossly altered per patient's daughter at bedside.  Code sepsis protocol activated.  Additionally, given patient's shortness of breath and hypoxia, patient was required initiation onto BiPAP.,.      Reviewed and confirmed nursing documentation for past medical history, family history, social history.    Initial Assessment:   This a critically ill patient whose presentation is most consistent with sepsis secondary to pneumonia and resulting respiratory failure.  She was stabilized on BiPAP.  She was started on IV vancomycin and cefepime for sepsis.  IV fluids held because of gross volume overload systemically and normal blood pressures initially.  On repeat assessment, patient started to have downtrending blood pressure and risk of IV fluids versus benefit favors proceeding with more IV fluids.  IV fluid bolus ordered. Patient stabilized on BiPAP with normalization of saturations.  Mental status was able to tolerate.  CT PE study was utilized to evaluate for worsening pneumonia versus pulmonary embolism and identified progression of her pneumonia.  Patient remained stable on BiPAP and was admitted to the hospital unit after discussion with hospitalist accepted patient in admission.  Patient admitted with no further acute events.     Clinical Impression:   1. SOB (shortness of breath)      Admit   Final Clinical Impression(s) / ED Diagnoses Final diagnoses:  SOB (shortness of breath)    Rx / DC Orders ED Discharge Orders     None         Tretha Sciara, MD 03/09/22 0011

## 2022-03-08 NOTE — ED Notes (Signed)
Patient transported to CT 

## 2022-03-08 NOTE — Progress Notes (Signed)
Patient transported to and from CT back to ED on BIPAP with no adverse events noted.

## 2022-03-08 NOTE — H&P (Addendum)
History and Physical    Patient: Robin Mcclure AFB:[id number] DOB: Jun 01, 1922 DOA: 03/08/2022 DOS: the patient was seen and examined on 03/09/2022 PCP: Lujean Amel, MD  Patient coming from: SNF  Chief Complaint:  Chief Complaint  Patient presents with   Loss of Consciousness   HPI: Robin Mcclure is a 88 y.o. female with medical history significant of  COPD, GERD, hypertension, hyperlipidemia, T2DM who presents to the emergency department via EMS from home SNF due to presumed syncope.  Patient was on BiPAP I was unable to provide history, most of the history history was obtained from ED physician and daughter at bedside.  Per report, patient was reported to have passed out while receiving breathing treatment at the nursing facility, EMS was activated, however, patient already regained consciousness by the time EMS team arrived.  O2 sats was reported to be in the 70% throughout the day today and chest x-ray done earlier today at the nursing facility was reported to have pneumonia per her daughter. Patient was recently admitted to this hospital from 10/4 to 10/16 due to partial small bowel obstruction versus ileus which was managed with NG tube with resolution of SBO on subsequent repeat x-ray.  ED Course:  In the emergency department, she was febrile with a temperature of 101.2F, tachypneic, BP 121/76 with an O2 sat of 90% on NRB at 15 L/min.  Work-up in the ED showed leukocytosis and normocytic anemia.  BMP showed sodium 149, potassium 3.9, chloride 113, bicarb 25, glucose 250, BUN 23, creatinine 1.01 albumin 2.5.  Troponin x2 -120 > 114.  SARS coronavirus 2 was negative CT angiography chest with contrast showed: 1. No CT findings for pulmonary embolism. 2. Stable tortuosity and calcification of the thoracic aorta but no focal aneurysm or dissection. 3. Stable marked enlargement of the pulmonary arteries suggesting pulmonary hypertension. 4. Dense bilateral lower lobe airspace consolidation  and air bronchograms likely pulmonary infiltrates. Aspiration would certainly be a consideration. 5. Small bilateral pleural effusions. 6. Stable age advanced atherosclerotic calcifications involving the aorta and coronary arteries. 7. Stable thoracic compression fractures and vertebral plana deformity at T11. 8. Aortic atherosclerosis. Chest x-ray showed chronic underlying lung disease with small bilateral pleural effusions and bibasilar atelectasis. She was treated with IV vancomycin and cefepime, IV hydration was provided. Hospitalist was asked to admit patient for further evaluation and management.  Review of Systems: Review of systems as noted in the HPI. All other systems reviewed and are negative.   Past Medical History:  Diagnosis Date   COPD (chronic obstructive pulmonary disease) ([place])    Diabetes mellitus without complication ([place])    Dysphagia    GERD (gastroesophageal reflux disease)    Hyperlipidemia    Hypertension    Past Surgical History:  Procedure Laterality Date   ABDOMINAL HYSTERECTOMY     BIOPSY  01/19/2018   Procedure: BIOPSY;  Surgeon: Ronnette Juniper, MD;  Location: [place];  Service: Gastroenterology;;   CHOLECYSTECTOMY     ESOPHAGOGASTRODUODENOSCOPY (EGD) WITH PROPOFOL N/A 01/19/2018   Procedure: ESOPHAGOGASTRODUODENOSCOPY (EGD) WITH PROPOFOL;  Surgeon: Ronnette Juniper, MD;  Location: [place];  Service: Gastroenterology;  Laterality: N/A;   HIP SURGERY      Social History:  reports that she has never smoked. She has never used smokeless tobacco. She reports that she does not drink alcohol and does not use drugs.   No Known Allergies  History reviewed. No pertinent family history.     Prior to Admission medications   Medication Sig Start  Date End Date Taking? Authorizing Provider  acetaminophen (TYLENOL) 650 MG CR tablet Take 1,300 mg by mouth every 8 (eight) hours as needed for pain.    [provider]  albuterol (PROVENTIL HFA;VENTOLIN  HFA) 108 (90 Base) MCG/ACT inhaler Inhale 1-2 puffs into the lungs every 6 (six) hours as needed for wheezing or shortness of breath.    [provider]  Fluticasone-Salmeterol (ADVAIR) 500-50 MCG/DOSE AEPB Inhale 1 puff into the lungs 2 (two) times daily.    [provider]  furosemide (LASIX) 40 MG tablet Take 40 mg by mouth daily. 06/19/21   [provider]  glycopyrrolate (ROBINUL) 1 MG tablet Take 1 tablet (1 mg total) by mouth 2 (two) times daily. 03/05/22 04/04/22  Shahmehdi, Valeria Batman, MD  JANUVIA 100 MG tablet Take 100 mg by mouth daily. 02/09/22   [provider]  lactobacillus (FLORANEX/LACTINEX) PACK Take 1 packet (1 g total) by mouth 3 (three) times daily with meals for 5 days. 03/05/22 03/10/22  Shahmehdi, Valeria Batman, MD  metFORMIN (GLUCOPHAGE) 500 MG tablet Take 500 mg by mouth daily. 07/06/21   [provider]  NIFEdipine (ADALAT CC) 90 MG 24 hr tablet Take 90 mg by mouth every morning.    [provider]  olopatadine (PATADAY) 0.1 % ophthalmic solution Place 1 drop into both eyes 2 (two) times daily.    [provider]  ondansetron (ZOFRAN) 4 MG tablet Take 4 mg by mouth every 8 (eight) hours as needed for nausea. 01/20/18   [provider]  pantoprazole (PROTONIX) 40 MG tablet Take 40 mg by mouth every morning. 03/10/18   [provider]  polyethylene glycol powder (MIRALAX) 17 GM/SCOOP powder See admin instructions.    [provider]  Potassium Chloride ER 20 MEQ TBCR Take 1 tablet by mouth 2 (two) times daily. 06/04/21   [provider]  pravastatin (PRAVACHOL) 20 MG tablet Take 20 mg by mouth every morning.    [provider]    Physical Exam: BP (!) 116/91   Pulse 86   Temp 99.4 F (37.4 C) (Axillary)   Resp (!) 31   Ht '5\' 5"'  (1.651 m)   Wt 62.4 kg   SpO2 100%   BMI 22.89 kg/m   General: 88 y.o. year-old female ill appearing, on BiPAP, but in no acute distress.  Alert  and oriented x3. HEENT: NCAT, EOMI Neck: Supple, trachea medial Cardiovascular: Tachycardia.  Irregular rate and rhythm with no rubs or gallops.  No thyromegaly or JVD noted.  B/L lower extremity edema. 2/4 pulses in all 4 extremities. Respiratory: Tachypnea.  Diffuse rhonchi worse in lower lobes on  auscultation.   Abdomen: Soft, nontender nondistended with normal bowel sounds x4 quadrants. Muskuloskeletal: No cyanosis or clubbing noted bilaterally Neuro: CN II-XII intact, sensation, reflexes intact Skin: No ulcerative lesions noted or rashes Psychiatry: Judgement and insight appear normal. Mood is appropriate for condition and setting          Labs on Admission:  Basic Metabolic Panel: Recent Labs  Lab 03/02/22 0609 03/03/22 0422 03/04/22 0421 03/05/22 0354 03/08/22 1731  NA 145 145 147* 148* 149*  K 3.8 3.6 3.5 3.5 3.9  CL 116* 114* 116* 116* 113*  CO2 '23 24 23 26 25  ' GLUCOSE 215* 187* 200* 164* 250*  BUN '21 19 17 15 23  ' CREATININE 0.61 0.63 0.64 0.65 1.01*  CALCIUM 9.3 9.1 9.6 9.5 9.0   Liver Function Tests: Recent Labs  Lab 03/08/22  1731  AST 14*  ALT 14  ALKPHOS 76  BILITOT 0.7  PROT 7.0  ALBUMIN 2.5*   Recent Labs  Lab 03/08/22 1731  LIPASE 23   No results for input(s): "AMMONIA" in the last 168 hours. CBC: Recent Labs  Lab 03/03/22 0422 03/04/22 0421 03/05/22 0354 03/08/22 1731  WBC 12.7* 11.9* 9.9 32.8*  NEUTROABS  --   --   --  29.8*  HGB 10.3* 10.7* 9.5* 11.0*  HCT 31.9* 32.8* 29.8* 35.3*  MCV 96.4 95.9 98.0 99.2  PLT 229 245 232 261   Cardiac Enzymes: No results for input(s): "CKTOTAL", "CKMB", "CKMBINDEX", "TROPONINI" in the last 168 hours.  BNP (last 3 results) No results for input(s): "BNP" in the last 8760 hours.  ProBNP (last 3 results) No results for input(s): "PROBNP" in the last 8760 hours.  CBG: Recent Labs  Lab 03/04/22 2011 03/05/22 0732 03/05/22 1142 03/05/22 1556 03/08/22 1723  GLUCAP 135* 183* 150* 150* 218*     Radiological Exams on Admission: CT Angio Chest PE W and/or Wo Contrast  Result Date: 03/08/2022 CLINICAL DATA:  Syncopal episode and low oxygen saturation. EXAM: CT ANGIOGRAPHY CHEST WITH CONTRAST TECHNIQUE: Multidetector CT imaging of the chest was performed using the standard protocol during bolus administration of intravenous contrast. Multiplanar CT image reconstructions and MIPs were obtained to evaluate the vascular anatomy. RADIATION DOSE REDUCTION: This exam was performed according to the departmental dose-optimization program which includes automated exposure control, adjustment of the mA and/or kV according to patient size and/or use of iterative reconstruction technique. CONTRAST:  48m OMNIPAQUE IOHEXOL 350 MG/ML SOLN COMPARISON:  02/23/2022 FINDINGS: Cardiovascular: The heart is mildly enlarged but stable. Moderate tortuosity of the thoracic aorta but no focal aneurysm or dissection. Stable aortic and coronary artery calcifications. Stable marked enlargement of the pulmonary arteries suggesting pulmonary hypertension. No filling defects to suggest pulmonary embolism. Mediastinum/Nodes: No mediastinal or hilar mass or adenopathy. The esophagus is grossly. Lungs/Pleura: Small bilateral pleural effusions. Dense bilateral lower lobe airspace consolidation and air bronchograms likely pulmonary infiltrates. Aspiration would certainly be a consideration. There are also patchy infiltrates in both upper lobes. No pneumothorax. Upper Abdomen: No significant upper abdominal findings. Musculoskeletal: No breast masses, supraclavicular or axillary adenopathy. The bony thorax is stable. Stable severe thoracic kyphosis with midthoracic compression fractures and stable vertebral plana deformity at T11. Review of the MIP images confirms the above findings. IMPRESSION: 1. No CT findings for pulmonary embolism. 2. Stable tortuosity and calcification of the thoracic aorta but no focal aneurysm or dissection. 3.  Stable marked enlargement of the pulmonary arteries suggesting pulmonary hypertension. 4. Dense bilateral lower lobe airspace consolidation and air bronchograms likely pulmonary infiltrates. Aspiration would certainly be a consideration. 5. Small bilateral pleural effusions. 6. Stable age advanced atherosclerotic calcifications involving the aorta and coronary arteries. 7. Stable thoracic compression fractures and vertebral plana deformity at T11. 8. Aortic atherosclerosis. Aortic Atherosclerosis (ICD10-I70.0). Electronically Signed   By: PMarijo SanesM.D.   On: 03/08/2022 21:35   DG Chest Port 1 View  Result Date: 03/08/2022 CLINICAL DATA:  Shortness of breath. EXAM: PORTABLE CHEST 1 VIEW COMPARISON:  03/01/2022 FINDINGS: The heart is borderline enlarged but stable. The mediastinal and hilar contours are within normal limits and unchanged. Tortuosity and calcification of the thoracic aorta. Chronic underlying lung changes with areas of pulmonary scarring. Small bilateral pleural effusions noted with bibasilar atelectasis. No obvious infiltrates. IMPRESSION: Chronic underlying lung disease with small bilateral pleural effusions and bibasilar atelectasis. Electronically  Signed   By: Marijo Sanes M.D.   On: 03/08/2022 18:02    EKG: I independently viewed the EKG done and my findings are as followed: A-fib with RVR with.  PVCs  Assessment/Plan Present on Admission:  Acute respiratory failure with hypoxia due to Aspiration PNA  Hypoalbuminemia due to protein-calorie malnutrition ([place])  Uncontrolled type 2 diabetes mellitus with hyperglycemia ([facility])  Chronic obstructive pulmonary disease (H[facility])  Gastroesophageal reflux disease  Essential hypertension  Active Problems:   Acute respiratory failure with hypoxia due to Aspiration PNA   Essential hypertension   Gastroesophageal reflux disease   Chronic obstructive pulmonary disease (HCC)   Hypoalbuminemia due to protein-calorie malnutrition (HCC)    Uncontrolled type 2 diabetes mellitus with hyperglycemia (H[facility])   Bilateral pleural effusion   Hypernatremia  Sepsis secondary to aspiration pneumonia Patient met sepsis criteria due to leukocytosis, fever, tachypnea and source of infection being the lung Patient was started on cefepime and vancomycin, we shall continue same at this time with plan to de-escalate/discontinue based on blood culture, sputum culture, urine Legionella, strep pneumo and procalcitonin Continue Tylenol as needed Continue Mucinex, incentive spirometry, flutter valve   Acute respiratory failure with hypoxia in the setting of above Patient is currently on BiPAP.  Consider transitioning patient to supplemental oxygen as tolerated.    Small bilateral pleural effusion Continue Lasix  Hypernatremia Na 149; patient may eventually need IV hydration due to dehydration However, she appears to have peripheral edema at this time  Hypoalbuminemia secondary to mild protein calorie malnutrition Patient is currently on BiPAP Consider protein supplement when patient resumes oral intake   T2DM with poorly controlled hyperglycemia Continue ISS and hypoglycemia protocol  Hypoalbuminemia secondary to mild protein calorie malnutrition Consider protein supplement when patient resumes oral intake    COPD Patient is currently n.p.o. while on BiPAP Continue DuoNebs as needed  GERD Continue Protonix   Essential hypertension (controlled) Continue IV hydralazine 10 mg every 6 hours as needed for SBP > 170 Continue home meds when patient resumes oral intake  Mixed hyperlipidemia Continue home meds when patient resumes oral intake  Goal of care: Palliative care will be consulted    Lab Results  Component Value Date   CREATININE 1.01 (H) 03/08/2022        DVT prophylaxis: Lovenox  Code Status: Full code  Family Communication: Daughter at bedside (all questions answered to satisfaction)  Consults:  None  Severity of Illness: The appropriate patient status for this patient is INPATIENT. Inpatient status is judged to be reasonable and necessary in order to provide the required intensity of service to ensure the patient's safety. The patient's presenting symptoms, physical exam findings, and initial radiographic and laboratory data in the context of their chronic comorbidities is felt to place them at high risk for further clinical deterioration. Furthermore, it is not anticipated that the patient will be medically stable for discharge from the hospital within 2 midnights of admission.   * I certify that at the point of admission it is my clinical judgment that the patient will require inpatient hospital care spanning beyond 2 midnights from the point of admission due to high intensity of service, high risk for further deterioration and high frequency of surveillance required.*  Author: Bernadette Hoit, DO 03/09/2022 4:37 AM  For on call review [email or web address].

## 2022-03-09 DIAGNOSIS — A419 Sepsis, unspecified organism: Secondary | ICD-10-CM

## 2022-03-09 DIAGNOSIS — J9601 Acute respiratory failure with hypoxia: Secondary | ICD-10-CM | POA: Diagnosis not present

## 2022-03-09 DIAGNOSIS — J690 Pneumonitis due to inhalation of food and vomit: Secondary | ICD-10-CM

## 2022-03-09 DIAGNOSIS — J90 Pleural effusion, not elsewhere classified: Secondary | ICD-10-CM

## 2022-03-09 DIAGNOSIS — E870 Hyperosmolality and hypernatremia: Secondary | ICD-10-CM | POA: Diagnosis not present

## 2022-03-09 DIAGNOSIS — I10 Essential (primary) hypertension: Secondary | ICD-10-CM | POA: Diagnosis not present

## 2022-03-09 DIAGNOSIS — E1165 Type 2 diabetes mellitus with hyperglycemia: Secondary | ICD-10-CM | POA: Diagnosis not present

## 2022-03-09 LAB — GLUCOSE, CAPILLARY
Glucose-Capillary: 121 mg/dL — ABNORMAL HIGH (ref 70–99)
Glucose-Capillary: 146 mg/dL — ABNORMAL HIGH (ref 70–99)
Glucose-Capillary: 150 mg/dL — ABNORMAL HIGH (ref 70–99)
Glucose-Capillary: 154 mg/dL — ABNORMAL HIGH (ref 70–99)
Glucose-Capillary: 168 mg/dL — ABNORMAL HIGH (ref 70–99)
Glucose-Capillary: 230 mg/dL — ABNORMAL HIGH (ref 70–99)

## 2022-03-09 LAB — PROCALCITONIN: Procalcitonin: 1.44 ng/mL

## 2022-03-09 LAB — CBC
HCT: 33.2 % — ABNORMAL LOW (ref 36.0–46.0)
Hemoglobin: 10 g/dL — ABNORMAL LOW (ref 12.0–15.0)
MCH: 30.7 pg (ref 26.0–34.0)
MCHC: 30.1 g/dL (ref 30.0–36.0)
MCV: 101.8 fL — ABNORMAL HIGH (ref 80.0–100.0)
Platelets: 241 10*3/uL (ref 150–400)
RBC: 3.26 MIL/uL — ABNORMAL LOW (ref 3.87–5.11)
RDW: 15.1 % (ref 11.5–15.5)
WBC: 28.5 10*3/uL — ABNORMAL HIGH (ref 4.0–10.5)
nRBC: 0 % (ref 0.0–0.2)

## 2022-03-09 LAB — MRSA NEXT GEN BY PCR, NASAL: MRSA by PCR Next Gen: NOT DETECTED

## 2022-03-09 LAB — COMPREHENSIVE METABOLIC PANEL
ALT: 11 U/L (ref 0–44)
AST: 10 U/L — ABNORMAL LOW (ref 15–41)
Albumin: 2.1 g/dL — ABNORMAL LOW (ref 3.5–5.0)
Alkaline Phosphatase: 66 U/L (ref 38–126)
Anion gap: 11 (ref 5–15)
BUN: 29 mg/dL — ABNORMAL HIGH (ref 8–23)
CO2: 25 mmol/L (ref 22–32)
Calcium: 8.9 mg/dL (ref 8.9–10.3)
Chloride: 117 mmol/L — ABNORMAL HIGH (ref 98–111)
Creatinine, Ser: 1.03 mg/dL — ABNORMAL HIGH (ref 0.44–1.00)
GFR, Estimated: 49 mL/min — ABNORMAL LOW (ref >60)
Glucose, Bld: 230 mg/dL — ABNORMAL HIGH (ref 70–99)
Potassium: 3.4 mmol/L — ABNORMAL LOW (ref 3.5–5.1)
Sodium: 153 mmol/L — ABNORMAL HIGH (ref 135–145)
Total Bilirubin: 0.5 mg/dL (ref 0.3–1.2)
Total Protein: 5.9 g/dL — ABNORMAL LOW (ref 6.5–8.1)

## 2022-03-09 LAB — STREP PNEUMONIAE URINARY ANTIGEN: Strep Pneumo Urinary Antigen: NEGATIVE

## 2022-03-09 MED ORDER — PIPERACILLIN-TAZOBACTAM 3.375 G IVPB
3.3750 g | Freq: Three times a day (TID) | INTRAVENOUS | Status: DC
  Administered 2022-03-09 – 2022-03-13 (×13): 3.375 g via INTRAVENOUS
  Filled 2022-03-09 (×13): qty 50

## 2022-03-09 MED ORDER — INSULIN ASPART 100 UNIT/ML IJ SOLN
0.0000 [IU] | INTRAMUSCULAR | Status: DC
  Administered 2022-03-09: 1 [IU] via SUBCUTANEOUS
  Administered 2022-03-09 – 2022-03-10 (×3): 2 [IU] via SUBCUTANEOUS
  Administered 2022-03-10 (×2): 1 [IU] via SUBCUTANEOUS
  Administered 2022-03-10 (×2): 2 [IU] via SUBCUTANEOUS
  Administered 2022-03-10 – 2022-03-11 (×4): 1 [IU] via SUBCUTANEOUS
  Administered 2022-03-11: 2 [IU] via SUBCUTANEOUS
  Administered 2022-03-12: 1 [IU] via SUBCUTANEOUS

## 2022-03-09 MED ORDER — PANTOPRAZOLE SODIUM 40 MG IV SOLR
40.0000 mg | INTRAVENOUS | Status: DC
  Administered 2022-03-09 – 2022-03-13 (×5): 40 mg via INTRAVENOUS
  Filled 2022-03-09 (×5): qty 10

## 2022-03-09 MED ORDER — INSULIN ASPART 100 UNIT/ML IJ SOLN
0.0000 [IU] | INTRAMUSCULAR | Status: DC
  Administered 2022-03-09: 5 [IU] via SUBCUTANEOUS
  Administered 2022-03-09: 2 [IU] via SUBCUTANEOUS

## 2022-03-09 MED ORDER — KCL IN DEXTROSE-NACL 20-5-0.45 MEQ/L-%-% IV SOLN: INTRAVENOUS | Status: DC

## 2022-03-09 MED ORDER — ACETAMINOPHEN 650 MG RE SUPP: 650.0000 mg | Freq: Four times a day (QID) | RECTAL | Status: DC | PRN

## 2022-03-09 MED ORDER — POTASSIUM CHLORIDE 10 MEQ/100ML IV SOLN
10.0000 meq | INTRAVENOUS | Status: AC
  Administered 2022-03-09 (×3): 10 meq via INTRAVENOUS
  Filled 2022-03-09 (×3): qty 100

## 2022-03-09 MED ORDER — CHLORHEXIDINE GLUCONATE CLOTH 2 % EX PADS
6.0000 | MEDICATED_PAD | Freq: Every day | CUTANEOUS | Status: DC
  Administered 2022-03-09 – 2022-03-13 (×5): 6 via TOPICAL

## 2022-03-09 MED ORDER — FUROSEMIDE 10 MG/ML IJ SOLN: 40.0000 mg | Freq: Every day | INTRAMUSCULAR | Status: DC

## 2022-03-09 MED ORDER — ORAL CARE MOUTH RINSE
15.0000 mL | OROMUCOSAL | Status: DC
  Administered 2022-03-09 – 2022-03-13 (×16): 15 mL via OROMUCOSAL

## 2022-03-09 MED ORDER — ORAL CARE MOUTH RINSE: 15.0000 mL | OROMUCOSAL | Status: DC | PRN

## 2022-03-09 MED ORDER — INSULIN ASPART 100 UNIT/ML IJ SOLN: 0.0000 [IU] | INTRAMUSCULAR | Status: DC

## 2022-03-09 MED ORDER — IPRATROPIUM-ALBUTEROL 0.5-2.5 (3) MG/3ML IN SOLN
3.0000 mL | RESPIRATORY_TRACT | Status: DC | PRN
  Administered 2022-03-11: 3 mL via RESPIRATORY_TRACT
  Filled 2022-03-09: qty 3

## 2022-03-09 MED ORDER — DM-GUAIFENESIN ER 30-600 MG PO TB12: 1.0000 | ORAL_TABLET | Freq: Two times a day (BID) | ORAL | Status: DC

## 2022-03-09 MED ORDER — ENOXAPARIN SODIUM 30 MG/0.3ML IJ SOSY
30.0000 mg | PREFILLED_SYRINGE | INTRAMUSCULAR | Status: DC
  Administered 2022-03-09 – 2022-03-10 (×2): 30 mg via SUBCUTANEOUS
  Filled 2022-03-09 (×2): qty 0.3

## 2022-03-09 NOTE — Plan of Care (Signed)

## 2022-03-09 NOTE — Hospital Course (Signed)
88 year old female with history of diabetes mellitus type 2, COPD, hypertension, hyperlipidemia, GERD, and dysphagia presenting from [facility] secondary to altered level of consciousness and hypoxia.  The patient is unable to provide any significant history secondary to her extremis and altered mental status.  History is obtained from review of the medical record and speaking with the patient's daughter.  Notably, the patient was noted to have altered level consciousness when she was getting a nebulizer treatment at [facility] on 03/08/2022.  It was thought that the patient may have had a syncopal type episode when she was receiving a nebulizer treatment.  The patient had regained consciousness by the time EMS arrived, but she was noted to have oxygen saturation of 70%.  Chest x-ray earlier in the day showed right lower lobe opacity. Notably, the patient was recently hospitalized from 02/21/2022 to 03/05/2022 for small bowel obstruction versus ileus.  Her hospitalization was also complicated by aspiration pneumonia. In the ED, the patient had temperature up to 101.3 F.  She was hemodynamically stable.  She was placed on BiPAP with saturation 95-97%.  WBC 32.8, hemoglobin 9.0, platelets 261,000.  Sodium 133, potassium 3.4, bicarbonate 25, serum creatinine 1.03.  LFTs were unremarkable.  Troponin 120>> 114.  CTA chest was negative for PE but showed bilateral lower lobe consolidations with air bronchograms.  There are small bilateral pleural effusions.  There is a stable chronic T11 fracture.  The patient was initially started on cefepime.  This was changed to Zosyn.  The patient was placed on BiPAP and admitted to the stepdown unit.

## 2022-03-09 NOTE — TOC Initial Note (Signed)
Transition of Care [facility]) - Initial/Assessment Note    Patient Details  Name: Robin Mcclure MRN: [id number] Date of Birth: [birth year]  [facility] CM/SW Contact:    Shade Flood, LCSW Phone Number: 03/09/2022, 9:08 AM  Clinical Narrative:                  Pt known to [facility] from recent admission. Pt discharged to [facility] for short term rehab after that admission. Pt has supportive and involved daughter. Will follow to assist as needed with dc planning.  Expected Discharge Plan: [place] Barriers to Discharge: Continued Medical Work up   Patient Goals and CMS Choice        Expected Discharge Plan and Services Expected Discharge Plan: [place] In-house Referral: Clinical Social Work     Living arrangements for the past 2 months: [place], [place]                                      Prior Living Arrangements/Services Living arrangements for the past 2 months: [place], [place] Lives with:: Self Patient language and need for interpreter reviewed:: Yes Do you feel safe going back to the place where you live?: Yes      Need for Family Participation in Patient Care: Yes (Comment) Care giver support system in place?: Yes (comment)   Criminal Activity/Legal Involvement Pertinent to Current Situation/Hospitalization: No - Comment as needed  Activities of Daily Living Home Assistive Devices/Equipment: Blood pressure cuff, CBG Meter, Built-in shower seat, Dentures (specify type), Eyeglasses, Hearing aid, Hospital bed, Raised toilet seat with rails, Walker (specify type), Wheelchair ADL Screening (condition at time of admission) Patient's cognitive ability adequate to safely complete daily activities?: No Is the patient deaf or have difficulty hearing?: Yes Does the patient have difficulty seeing, even when wearing glasses/contacts?: No Does the patient have difficulty  concentrating, remembering, or making decisions?: No Patient able to express need for assistance with ADLs?: No Does the patient have difficulty dressing or bathing?: Yes Independently performs ADLs?: No Communication: Independent Dressing (OT): Needs assistance Is this a change from baseline?: Pre-admission baseline Grooming: Needs assistance Is this a change from baseline?: Pre-admission baseline Feeding: Needs assistance Is this a change from baseline?: Pre-admission baseline Bathing: Needs assistance Is this a change from baseline?: Pre-admission baseline Toileting: Needs assistance Is this a change from baseline?: Pre-admission baseline In/Out Bed: Needs assistance Is this a change from baseline?: Pre-admission baseline Walks in Home: Needs assistance Is this a change from baseline?: Pre-admission baseline Does the patient have difficulty walking or climbing stairs?: Yes Weakness of Legs: Both Weakness of Arms/Hands: Both  Permission Sought/Granted                  Emotional Assessment       Orientation: : Oriented to Self Alcohol / Substance Use: Not Applicable Psych Involvement: No (comment)  Admission diagnosis:  Aspiration pneumonia ([place]) [J69.0] SOB (shortness of breath) [R06.02] Patient Active Problem List   Diagnosis Date Noted   Bilateral pleural effusion 03/09/2022   Hypernatremia 03/09/2022   Sepsis due to undetermined organism ([place]) 03/09/2022   Acute respiratory failure with hypoxia ([place]) 03/09/2022   Aspiration pneumonitis ([place]) 02/25/2022   Partial small bowel obstruction ([place]) 02/21/2022   Dehydration 02/21/2022   Hypoalbuminemia due to protein-calorie malnutrition ([place]) 02/21/2022   Uncontrolled type 2 diabetes  mellitus with hyperglycemia ([place]) 02/21/2022   Mixed hyperlipidemia 02/21/2022   Right leg swelling 02/21/2022   Hearing loss 10/29/2021   Age-related osteoporosis without current pathological fracture 07/19/2021   Anxiety 07/19/2021    Arteriosclerosis of aorta ([place]) 07/19/2021   Atelectasis 07/19/2021   Atherosclerotic heart disease of native coronary artery without angina pectoris 07/19/2021   Chronic kidney disease, stage 3b ([place]) 07/19/2021   Chronic obstructive pulmonary disease ([place]) 07/19/2021   Diabetic renal disease ([place]) 07/19/2021   Hypertensive cardiovascular-renal disease, stage 1-4 or unspecified chronic kidney disease, without heart failure 07/19/2021   Hypertensive pulmonary arterial disease ([place]) 07/19/2021   Non-rheumatic mitral regurgitation 07/19/2021   Polyneuropathy due to type 2 diabetes mellitus ([place]) 07/19/2021   Other rheumatic multiple valve diseases 07/19/2021   Pure hypercholesterolemia 07/19/2021   Gastroesophageal reflux disease    Dementia without behavioral disturbance ([place])    Healthcare-associated pneumonia 08/18/2020   Diabetes ([place]) 08/18/2020   Essential hypertension 08/18/2020   Hypokalemia 08/18/2020   Pericarditis 06/20/2020   Pericardial effusion    Presbycusis of both ears 09/29/2018   PCP:  Lujean Amel, MD Pharmacy:   [id number] [place] [id number]: [phone]: [phone]    Social Determinants of Health (SDOH) Interventions Housing Interventions: Intervention Not Indicated  Readmission Risk Interventions    08/22/2020    1:40 PM  Readmission Risk Prevention Plan  Medication Screening Complete  Transportation Screening Complete

## 2022-03-09 NOTE — [place] FL2 (Signed)
[place] MEDICAID FL2 LEVEL OF CARE SCREENING TOOL     IDENTIFICATION  Patient Name: Robin Mcclure Birthdate: [birth year] Sex: female Admission Date (Current Location): 03/08/2022  [facility] and [place] Number:  [employer] and Address:  [facility] [address] [place]      Provider Number: [id number]  Attending Physician Name and Address:  Orson Eva, MD  Relative Name and Phone Number:       Current Level of Care: Hospital Recommended Level of Care: [place] Prior Approval Number:    Date Approved/Denied:   PASRR Number: [id number]  Discharge Plan: SNF    Current Diagnoses: Patient Active Problem List   Diagnosis Date Noted   Bilateral pleural effusion 03/09/2022   Hypernatremia 03/09/2022   Sepsis due to undetermined organism ([place]) 03/09/2022   Acute respiratory failure with hypoxia ([place]) 03/09/2022   Aspiration pneumonitis ([place]) 02/25/2022   Partial small bowel obstruction ([place]) 02/21/2022   Dehydration 02/21/2022   Hypoalbuminemia due to protein-calorie malnutrition ([place]) 02/21/2022   Uncontrolled type 2 diabetes mellitus with hyperglycemia ([place]) 02/21/2022   Mixed hyperlipidemia 02/21/2022   Right leg swelling 02/21/2022   Hearing loss 10/29/2021   Age-related osteoporosis without current pathological fracture 07/19/2021   Anxiety 07/19/2021   Arteriosclerosis of aorta ([place]) 07/19/2021   Atelectasis 07/19/2021   Atherosclerotic heart disease of native coronary artery without angina pectoris 07/19/2021   Chronic kidney disease, stage 3b ([place]) 07/19/2021   Chronic obstructive pulmonary disease ([place]) 07/19/2021   Diabetic renal disease ([place]) 07/19/2021   Hypertensive cardiovascular-renal disease, stage 1-4 or unspecified chronic kidney disease, without heart failure 07/19/2021   Hypertensive pulmonary arterial disease ([place]) 07/19/2021   Non-rheumatic mitral regurgitation 07/19/2021   Polyneuropathy due  to type 2 diabetes mellitus ([place]) 07/19/2021   Other rheumatic multiple valve diseases 07/19/2021   Pure hypercholesterolemia 07/19/2021   Gastroesophageal reflux disease    Dementia without behavioral disturbance ([place])    Healthcare-associated pneumonia 08/18/2020   Diabetes ([place]) 08/18/2020   Essential hypertension 08/18/2020   Hypokalemia 08/18/2020   Pericarditis 06/20/2020   Pericardial effusion    Presbycusis of both ears 09/29/2018    Orientation RESPIRATION BLADDER Height & Weight     Self, Place  O2 (see dc summary) Incontinent Weight: 127 lb 3.3 oz (57.7 kg) Height:  '5\' 5"'$  (165.1 cm)  BEHAVIORAL SYMPTOMS/MOOD NEUROLOGICAL BOWEL NUTRITION STATUS      Continent Diet (see dc summary)  AMBULATORY STATUS COMMUNICATION OF NEEDS Skin   Extensive Assist Verbally Normal                       Personal Care Assistance Level of Assistance    Bathing Assistance: Limited assistance Feeding assistance: Independent Dressing Assistance: Limited assistance     Functional Limitations Info    Sight Info: Adequate Hearing Info: Impaired Speech Info: Adequate    SPECIAL CARE FACTORS FREQUENCY        PT Frequency: 5x week OT Frequency: 3x week            Contractures Contractures Info: Not present    Additional Factors Info    Code Status Info: DNR Allergies Info: NKA           Current Medications (03/09/2022):  This is the current hospital active medication list Current Facility-Administered Medications  Medication Dose Route Frequency Provider Last Rate Last Admin   acetaminophen (TYLENOL) suppository 650 mg  650 mg Rectal Q6H PRN Tat, Shanon Brow,  MD       Chlorhexidine Gluconate Cloth 2 % PADS 6 each  6 each Topical Daily Adefeso, Oladapo, DO   6 each at 03/09/22 0034   dextrose 5 % and 0.45 % NaCl with KCl 20 mEq/L infusion   Intravenous Continuous Tat, Shanon Brow, MD       enoxaparin (LOVENOX) injection 30 mg  30 mg Subcutaneous Q24H Adefeso, Oladapo, DO   30 mg  at 03/09/22 0517   insulin aspart (novoLOG) injection 0-9 Units  0-9 Units Subcutaneous Q4H Tat, David, MD       ipratropium-albuterol (DUONEB) 0.5-2.5 (3) MG/3ML nebulizer solution 3 mL  3 mL Nebulization Q4H PRN Adefeso, Oladapo, DO       Oral care mouth rinse  15 mL Mouth Rinse 4 times per day Adefeso, Oladapo, DO   15 mL at 03/09/22 0811   Oral care mouth rinse  15 mL Mouth Rinse PRN Adefeso, Oladapo, DO       pantoprazole (PROTONIX) injection 40 mg  40 mg Intravenous Q24H Adefeso, Oladapo, DO   40 mg at 03/09/22 0447   piperacillin-tazobactam (ZOSYN) IVPB 3.375 g  3.375 g Intravenous Franco Collet, MD 12.5 mL/hr at 03/09/22 0854 Infusion Verify at 03/09/22 0854   potassium chloride 10 mEq in 100 mL IVPB  10 mEq Intravenous Q1 Hr x 3 Adefeso, Oladapo, DO 100 mL/hr at 03/09/22 0854 Infusion Verify at 03/09/22 0762     Discharge Medications: Please see discharge summary for a list of discharge medications.  Relevant Imaging Results:  Relevant Lab Results:   Additional Information    Shade Flood, LCSW

## 2022-03-09 NOTE — Progress Notes (Signed)
Patient transported from ED to ICU bed 5 on BIPAP with no adverse events noted.

## 2022-03-09 NOTE — Care Management Important Message (Signed)
Important Message  Patient Details  Name: Robin Mcclure MRN: [id number] Date of Birth: [birth year]   Medicare Important Message Given:  Yes     [name] 03/09/2022, 5:19 PM

## 2022-03-09 NOTE — Progress Notes (Signed)
PROGRESS NOTE  Robin Mcclure WEX:[id number] DOB: [birth year] DOA: 03/08/2022 PCP: Lujean Amel, MD  Brief History:  88 year old female with history of diabetes mellitus type 2, COPD, hypertension, hyperlipidemia, GERD, and dysphagia presenting from [facility] secondary to altered level of consciousness and hypoxia.  The patient is unable to provide any significant history secondary to her extremis and altered mental status.  History is obtained from review of the medical record and speaking with the patient's daughter.  Notably, the patient was noted to have altered level consciousness when she was getting a nebulizer treatment at [facility] on 03/08/2022.  It was thought that the patient may have had a syncopal type episode when she was receiving a nebulizer treatment.  The patient had regained consciousness by the time EMS arrived, but she was noted to have oxygen saturation of 70%.  Chest x-ray earlier in the day showed right lower lobe opacity. Notably, the patient was recently hospitalized from 02/21/2022 to 03/05/2022 for small bowel obstruction versus ileus.  Her hospitalization was also complicated by aspiration pneumonia. In the ED, the patient had temperature up to 101.3 F.  She was hemodynamically stable.  She was placed on BiPAP with saturation 95-97%.  WBC 32.8, hemoglobin 9.0, platelets 261,000.  Sodium 133, potassium 3.4, bicarbonate 25, serum creatinine 1.03.  LFTs were unremarkable.  Troponin 120>> 114.  CTA chest was negative for PE but showed bilateral lower lobe consolidations with air bronchograms.  There are small bilateral pleural effusions.  There is a stable chronic T11 fracture.  The patient was initially started on cefepime.  This was changed to Zosyn.  The patient was placed on BiPAP and admitted to the stepdown unit.   Assessment/Plan: Sepsis -Present on admission -Presented with fever, leukocytosis, respiratory failure -Secondary to pneumonia -Continue IV  fluids -Start IV Zosyn  Aspiration pneumonia -MRSA screen -Continue Zosyn -CTA chest negative for PE, shows bilateral lower lobe consolidation with air bronchograms  Acute respiratory failure with hypoxia -Secondary to pneumonia -Currently on BiPAP -Wean as tolerated  Diabetes mellitus type 2 -NovoLog sliding scale -02/22/2022 hemoglobin A1c 6.4 -Holding Januvia and metformin  Essential hypertension -Holding nifedipine secondary to soft blood pressure  COPD -Start DuoNebs  Hypernatremia -Start hypotonic fluid -Hold furosemide  Hyperlipidemia -Restart statin once able to tolerate p.o.  GOC -discussed with daughter>>confirms DNR    Family Communication:   daughter updated 10/20  Consultants: none   Code Status:  DNR--confirmed with daughter 10/20  DVT Prophylaxis:  [place] Lovenox   Procedures: As Listed in Progress Note Above  Antibiotics: Zosyn 10/20>>      Subjective: Patient is confused.  She remains on BiPAP.  She denies any pain or shortness of breath presently.  Remainder review of systems unobtainable.  Objective: Vitals:   03/09/22 0440 03/09/22 0500 03/09/22 0600 03/09/22 0813  BP:  (!) 115/44 94/80   Pulse:  78 75   Resp:  (!) 29 (!) 26   Temp: 97.8 F (36.6 C)   (!) 97.2 F (36.2 C)  TempSrc: Axillary   Axillary  SpO2:  96% 97%   Weight: 57.7 kg     Height: '5\' 5"'$  (1.651 m)       Intake/Output Summary (Last 24 hours) at 03/09/2022 0846 Last data filed at 03/09/2022 0300 Gross per 24 hour  Intake 1233.54 ml  Output 200 ml  Net 1033.54 ml   Weight change:  Exam:  General:  Pt is alert, follows  commands appropriately, not in acute distress HEENT: No icterus, No thrush, No neck mass, [place]/AT Cardiovascular: RRR, S1/S2, no rubs, no gallops Respiratory: Bilateral rhonchi. Abdomen: Soft/+BS, non tender, non distended, no guarding Extremities: 2  + LE edema, No lymphangitis, No petechiae, No rashes, no synovitis   Data Reviewed: I  have personally reviewed following labs and imaging studies Basic Metabolic Panel: Recent Labs  Lab 03/03/22 0422 03/04/22 0421 03/05/22 0354 03/08/22 1731 03/09/22 0553  NA 145 147* 148* 149* 153*  K 3.6 3.5 3.5 3.9 3.4*  CL 114* 116* 116* 113* 117*  CO2 '24 23 26 25 25  '$ GLUCOSE 187* 200* 164* 250* 230*  BUN '19 17 15 23 '$ 29*  CREATININE 0.63 0.64 0.65 1.01* 1.03*  CALCIUM 9.1 9.6 9.5 9.0 8.9   Liver Function Tests: Recent Labs  Lab 03/08/22 1731 03/09/22 0553  AST 14* 10*  ALT 14 11  ALKPHOS 76 66  BILITOT 0.7 0.5  PROT 7.0 5.9*  ALBUMIN 2.5* 2.1*   Recent Labs  Lab 03/08/22 1731  LIPASE 23   No results for input(s): "AMMONIA" in the last 168 hours. Coagulation Profile: No results for input(s): "INR", "PROTIME" in the last 168 hours. CBC: Recent Labs  Lab 03/03/22 0422 03/04/22 0421 03/05/22 0354 03/08/22 1731 03/09/22 0553  WBC 12.7* 11.9* 9.9 32.8* 28.5*  NEUTROABS  --   --   --  29.8*  --   HGB 10.3* 10.7* 9.5* 11.0* 10.0*  HCT 31.9* 32.8* 29.8* 35.3* 33.2*  MCV 96.4 95.9 98.0 99.2 101.8*  PLT 229 245 232 261 241   Cardiac Enzymes: No results for input(s): "CKTOTAL", "CKMB", "CKMBINDEX", "TROPONINI" in the last 168 hours. BNP: Invalid input(s): "POCBNP" CBG: Recent Labs  Lab 03/05/22 1142 03/05/22 1556 03/08/22 1723 03/09/22 0439 03/09/22 0745  GLUCAP 150* 150* 218* 230* 146*   HbA1C: No results for input(s): "HGBA1C" in the last 72 hours. Urine analysis:    Component Value Date/Time   COLORURINE YELLOW 02/21/2022 1517   APPEARANCEUR HAZY (A) 02/21/2022 1517   LABSPEC 1.015 02/21/2022 1517   PHURINE 5.0 02/21/2022 1517   GLUCOSEU NEGATIVE 02/21/2022 1517   HGBUR NEGATIVE 02/21/2022 1517   BILIRUBINUR NEGATIVE 02/21/2022 1517   KETONESUR 20 (A) 02/21/2022 1517   PROTEINUR NEGATIVE 02/21/2022 1517   NITRITE NEGATIVE 02/21/2022 1517   LEUKOCYTESUR NEGATIVE 02/21/2022 1517   Sepsis  Labs: '@LABRCNTIP'$ (procalcitonin:4,lacticidven:4) ) Recent Results (from the past 240 hour(s))  SARS Coronavirus 2 by RT PCR (hospital order, performed in [place] hospital lab) *cepheid single result test* Anterior Nasal Swab     Status: None   Collection Time: 03/08/22  5:31 PM   Specimen: Anterior Nasal Swab  Result Value Ref Range Status   SARS Coronavirus 2 by RT PCR NEGATIVE NEGATIVE Final    Comment: (NOTE) SARS-CoV-2 target nucleic acids are NOT DETECTED.  The SARS-CoV-2 RNA is generally detectable in upper and lower respiratory specimens during the acute phase of infection. The lowest concentration of SARS-CoV-2 viral copies this assay can detect is 250 copies / mL. A negative result does not preclude SARS-CoV-2 infection and should not be used as the sole basis for treatment or other patient management decisions.  A negative result may occur with improper specimen collection / handling, submission of specimen other than nasopharyngeal swab, presence of viral mutation(s) within the areas targeted by this assay, and inadequate number of viral copies (<250 copies / mL). A negative result must be combined with clinical observations, patient history, and  epidemiological information.  Fact Sheet for Patients:   https://www.patel.info/  Fact Sheet for Healthcare Providers: https://hall.com/  This test is not yet approved or  cleared by the [place] FDA and has been authorized for detection and/or diagnosis of SARS-CoV-2 by FDA under an Emergency Use Authorization (EUA).  This EUA will remain in effect (meaning this test can be used) for the duration of the COVID-19 declaration under Section 564(b)(1) of the Act, 21 U.S.C. section 360bbb-3(b)(1), unless the authorization is terminated or revoked sooner.  Performed at [facility], [address], [place], [place] [address]   Blood culture (routine x 2)     Status: None  (Preliminary result)   Collection Time: 03/08/22  5:31 PM   Specimen: BLOOD  Result Value Ref Range Status   Specimen Description BLOOD BLOOD LEFT ARM  Final   Special Requests BLOOD  Final   Culture   Final    NO GROWTH < 24 HOURS Performed at [facility], [address], [place], [place] [address]    Report Status PENDING  Incomplete  Blood culture (routine x 2)     Status: None (Preliminary result)   Collection Time: 03/08/22  5:40 PM   Specimen: BLOOD  Result Value Ref Range Status   Specimen Description BLOOD LEFT ANTECUBITAL  Final   Special Requests   Final    BOTTLES DRAWN AEROBIC AND ANAEROBIC Blood Culture adequate volume   Culture   Final    NO GROWTH < 24 HOURS Performed at [facility], [address], [place], [place] [address]    Report Status PENDING  Incomplete     Scheduled Meds:  acetaminophen  1,000 mg Oral Once   Chlorhexidine Gluconate Cloth  6 each Topical Daily   dextromethorphan-guaiFENesin  1 tablet Oral BID   enoxaparin (LOVENOX) injection  30 mg Subcutaneous Q24H   insulin aspart  0-15 Units Subcutaneous Q4H   mouth rinse  15 mL Mouth Rinse 4 times per day   pantoprazole (PROTONIX) IV  40 mg Intravenous Q24H   Continuous Infusions:  piperacillin-tazobactam (ZOSYN)  IV 3.375 g (03/09/22 0755)   potassium chloride 10 mEq (03/09/22 0844)    Procedures/Studies: CT Angio Chest PE W and/or Wo Contrast  Result Date: 03/08/2022 CLINICAL DATA:  Syncopal episode and low oxygen saturation. EXAM: CT ANGIOGRAPHY CHEST WITH CONTRAST TECHNIQUE: Multidetector CT imaging of the chest was performed using the standard protocol during bolus administration of intravenous contrast. Multiplanar CT image reconstructions and MIPs were obtained to evaluate the vascular anatomy. RADIATION DOSE REDUCTION: This exam was performed according to the departmental dose-optimization program which includes automated exposure control, adjustment of the mA and/or kV according to  patient size and/or use of iterative reconstruction technique. CONTRAST:  37m OMNIPAQUE IOHEXOL 350 MG/ML SOLN COMPARISON:  02/23/2022 FINDINGS: Cardiovascular: The heart is mildly enlarged but stable. Moderate tortuosity of the thoracic aorta but no focal aneurysm or dissection. Stable aortic and coronary artery calcifications. Stable marked enlargement of the pulmonary arteries suggesting pulmonary hypertension. No filling defects to suggest pulmonary embolism. Mediastinum/Nodes: No mediastinal or hilar mass or adenopathy. The esophagus is grossly. Lungs/Pleura: Small bilateral pleural effusions. Dense bilateral lower lobe airspace consolidation and air bronchograms likely pulmonary infiltrates. Aspiration would certainly be a consideration. There are also patchy infiltrates in both upper lobes. No pneumothorax. Upper Abdomen: No significant upper abdominal findings. Musculoskeletal: No breast masses, supraclavicular or axillary adenopathy. The bony thorax is stable. Stable severe thoracic kyphosis with midthoracic compression fractures and stable  vertebral plana deformity at T11. Review of the MIP images confirms the above findings. IMPRESSION: 1. No CT findings for pulmonary embolism. 2. Stable tortuosity and calcification of the thoracic aorta but no focal aneurysm or dissection. 3. Stable marked enlargement of the pulmonary arteries suggesting pulmonary hypertension. 4. Dense bilateral lower lobe airspace consolidation and air bronchograms likely pulmonary infiltrates. Aspiration would certainly be a consideration. 5. Small bilateral pleural effusions. 6. Stable age advanced atherosclerotic calcifications involving the aorta and coronary arteries. 7. Stable thoracic compression fractures and vertebral plana deformity at T11. 8. Aortic atherosclerosis. Aortic Atherosclerosis (ICD10-I70.0). Electronically Signed   By: Marijo Sanes M.D.   On: 03/08/2022 21:35   DG Chest Port 1 View  Result Date:  03/08/2022 CLINICAL DATA:  Shortness of breath. EXAM: PORTABLE CHEST 1 VIEW COMPARISON:  03/01/2022 FINDINGS: The heart is borderline enlarged but stable. The mediastinal and hilar contours are within normal limits and unchanged. Tortuosity and calcification of the thoracic aorta. Chronic underlying lung changes with areas of pulmonary scarring. Small bilateral pleural effusions noted with bibasilar atelectasis. No obvious infiltrates. IMPRESSION: Chronic underlying lung disease with small bilateral pleural effusions and bibasilar atelectasis. Electronically Signed   By: Marijo Sanes M.D.   On: 03/08/2022 18:02   [place] CHEST (PLEURAL EFFUSION)  Result Date: 03/01/2022 CLINICAL DATA:  LEFT pleural effusion EXAM: CHEST ULTRASOUND COMPARISON:  Chest radiograph 03/01/2022 FINDINGS: Small LEFT pleural effusion is identified. Based on patient age/size and condition, this is insufficient for thoracentesis. Atelectasis in LEFT lower lobe. IMPRESSION: Small LEFT pleural effusion insufficient for thoracentesis. Electronically Signed   By: Lavonia Dana M.D.   On: 03/01/2022 12:12   DG CHEST PORT 1 VIEW  Result Date: 03/01/2022 CLINICAL DATA:  Shortness of breath. EXAM: PORTABLE CHEST 1 VIEW COMPARISON:  02/27/2022 FINDINGS: Stable cardiomediastinal contours. Bilateral pleural effusions are noted, left greater than right. Persistent complete retrocardiac opacification of the left lung base. Again noted are patchy bilateral upper lobe and right lung base opacities. These are unchanged compared with the previous exam. IMPRESSION: 1. No change in aeration to the lungs compared with previous exam. 2. Persistent bilateral pleural effusions, left greater than right. Electronically Signed   By: Kerby Moors M.D.   On: 03/01/2022 08:22   DG ABD ACUTE 2+V W 1V CHEST  Result Date: 02/28/2022 CLINICAL DATA:  Shortness of breath. EXAM: DG ABDOMEN ACUTE WITH 1 VIEW CHEST COMPARISON:  Radiographs 02/27/2022 FINDINGS:  Progressive pulmonary infiltrates and probable small bilateral pleural effusions. Contrast noted throughout the colon from a recent swallowing function test. No findings for small bowel obstruction or free air. IMPRESSION: 1. Progressive pulmonary infiltrates and probable small bilateral pleural effusions. 2. No acute abdominal findings. Electronically Signed   By: Marijo Sanes M.D.   On: 02/28/2022 14:36   DG SWALLOW FUNC SPEECH PATH  Result Date: 02/27/2022 Table formatting from the original result was not included. Objective Swallowing Evaluation: Type of Study: MBS-Modified Barium Swallow Study  Patient Details Name: Robin Mcclure MRN: [id number] Date of Birth: 05-21-1923 Today's Date: 02/27/2022 Time: SLP Start Time (ACUTE ONLY): 1400 -SLP Stop Time (ACUTE ONLY): 5638 SLP Time Calculation (min) (ACUTE ONLY): 33 min Past Medical History: Past Medical History: Diagnosis Date  Diabetes mellitus without complication ([place])   Hyperlipidemia   Hypertension  Past Surgical History: Past Surgical History: Procedure Laterality Date  ABDOMINAL HYSTERECTOMY    BIOPSY  01/19/2018  Procedure: BIOPSY;  Surgeon: Ronnette Juniper, MD;  Location: [facility];  Service: Gastroenterology;;  CHOLECYSTECTOMY  ESOPHAGOGASTRODUODENOSCOPY (EGD) WITH PROPOFOL N/A 01/19/2018  Procedure: ESOPHAGOGASTRODUODENOSCOPY (EGD) WITH PROPOFOL;  Surgeon: Ronnette Juniper, MD;  Location: [facility];  Service: Gastroenterology;  Laterality: N/A;  HIP SURGERY   HPI: 88 y.o. female with medical history significant of COPD, GERD, hypertension, hyperlipidemia, T2DM admitted on 02/21/2022 with intractable emesis and found to have partial small bowel obstruction  Versus ileus. in the early hours of 02/24/2022 patient developed significant respiratory distress with hypoxia O2 sats down to the 60s  -Required deep suctioning, bronchodilators, high flow oxygen  Arterial Blood Gas result:  pO2 44; pCO2 48; pH 7.3;  HCO3 23.6, %O2 Sat 76.8 on 8 L of [place]  -Currently  requiring up to 11 L of oxygen via nasal cannula. BSE requested.  Subjective: "I am getting so tired."  Recommendations for follow up therapy are one component of a multi-disciplinary discharge planning process, led by the attending physician.  Recommendations may be updated based on patient status, additional functional criteria and insurance authorization. Assessment / Plan / Recommendation   02/27/2022   4:00 PM Clinical Impressions Clinical Impression Pt presents with mi/mod oropharyngeal and suspected primary esophageal dysphagia characterized by weak lingual manipulation and prolonged oral transit, swallow trigger at the level of the valleculae, decreased tongue base retraction and epiglottic deflection which appeared to be negatively impacted by elongated uvulae that nearly meat the tip of the epiglottis and impaired epiglottic deflection and approximation with posterior pharyngeal wall resulting in reduced laryngeal vestibule closure and penetration of thins during the swallow and penetration/aspiration to the vocal folds (difficult to see if below vocal folds at times) in trace amounts and resulting vallecular residue and occasional contrast behind the uvulae. Epiglottis deflected greater with puree. Nectars only spilled over into the laryngeal vestibule in trace amounts, but also resulted in vallecular residue. Esophageal sweep revealed extensive barium contrast diffusely throughout the esophagus that resulted in some backflow/retrograde movement to the cervical esophagus. Pt still currently requires high supplemental oxygen and fatigues quickly, which increases risk for aspiration and decreases her ability to meet nutritional needs. Pt's daughter indicates that Pt was on nectar thickened liquids about 1-2 years ago and "did not like it". SLP reviewed imaging with Pt's daughter and suggested that we change her to NTL for now, with plan to advance her once she is clinically stronger. Daughter is willing to  try this. Question whether Pt's appearance of elongated uvula could be due to some swelling from recent NG placement? Recommend D1/puree and NTL via cup/straw with pacing strategies due to shortness of breath and fatigue and ensure that Pt is sitting upright for all eating/drinking and remains up for at least 30 minutes after meals due to esophageal dsymotility. SLP will follow during acute stay. SLP Visit Diagnosis Dysphagia, pharyngoesophageal phase (R13.14);Dysphagia, oropharyngeal phase (R13.12) Impact on safety and function Mild aspiration risk;Risk for inadequate nutrition/hydration     02/27/2022   4:00 PM Treatment Recommendations Treatment Recommendations Therapy as outlined in treatment plan below     02/27/2022   4:00 PM Prognosis Prognosis for Safe Diet Advancement Good   02/27/2022   4:00 PM Diet Recommendations SLP Diet Recommendations Dysphagia 1 (Puree) solids;Nectar thick liquid Liquid Administration via Cup;Straw Medication Administration Whole meds with puree Compensations Slow rate;Small sips/bites;Multiple dry swallows after each bite/sip Postural Changes Remain semi-upright after after feeds/meals (Comment);Seated upright at 90 degrees     02/27/2022   4:00 PM Other Recommendations Recommended Consults Consider esophageal assessment Oral Care Recommendations Oral care BID;Staff/trained caregiver to provide oral  care Other Recommendations Clarify dietary restrictions;Order thickener from pharmacy;Prohibited food (jello, ice cream, thin soups) Follow Up Recommendations Skilled nursing-short term rehab (<3 hours/day) Assistance recommended at discharge Frequent or constant Supervision/Assistance Functional Status Assessment Patient has had a recent decline in their functional status and demonstrates the ability to make significant improvements in function in a reasonable and predictable amount of time.   02/27/2022   4:00 PM Frequency and Duration  Speech Therapy Frequency (ACUTE ONLY) min 2x/week  Treatment Duration 1 week     02/27/2022   4:00 PM Oral Phase Oral Phase Impaired Oral - Mech Soft Weak lingual manipulation;Delayed oral transit;Piecemeal swallowing    02/27/2022   4:00 PM Pharyngeal Phase Pharyngeal Phase Impaired Pharyngeal- Nectar Straw Delayed swallow initiation-vallecula;Reduced epiglottic inversion;Penetration/Aspiration during swallow;Pharyngeal residue - valleculae Pharyngeal Material enters airway, remains ABOVE vocal cords then ejected out Pharyngeal- Thin Teaspoon Delayed swallow initiation-vallecula;Reduced epiglottic inversion;Reduced tongue base retraction;Penetration/Aspiration during swallow;Penetration/Apiration after swallow;Pharyngeal residue - valleculae Pharyngeal Material enters airway, CONTACTS cords and not ejected out;Material enters airway, remains ABOVE vocal cords and not ejected out Pharyngeal- Thin Cup Delayed swallow initiation-vallecula;Reduced epiglottic inversion;Reduced airway/laryngeal closure;Reduced tongue base retraction;Penetration/Aspiration during swallow;Penetration/Apiration after swallow;Trace aspiration;Pharyngeal residue - valleculae Pharyngeal Material enters airway, CONTACTS cords and not ejected out Pharyngeal- Thin Straw Delayed swallow initiation-vallecula;Delayed swallow initiation-pyriform sinuses;Reduced tongue base retraction;Reduced epiglottic inversion;Penetration/Aspiration during swallow;Pharyngeal residue - valleculae Pharyngeal Material enters airway, remains ABOVE vocal cords and not ejected out Pharyngeal- Puree Delayed swallow initiation-vallecula;Reduced tongue base retraction;Reduced epiglottic inversion;Pharyngeal residue - valleculae Pharyngeal- Mechanical Soft Reduced epiglottic inversion;Reduced tongue base retraction;Delayed swallow initiation-vallecula;Pharyngeal residue - valleculae Pharyngeal- Pill WFL;Delayed swallow initiation-vallecula    02/27/2022   4:00 PM Cervical Esophageal Phase  Cervical Esophageal Phase Impaired  Puree Esophageal backflow into cervical esophagus Thank you, Genene Churn, [place] 02/27/2022, 5:27 PM                     DG CHEST PORT 1 VIEW  Result Date: 02/27/2022 CLINICAL DATA:  Aspiration into airway. EXAM: PORTABLE CHEST 1 VIEW COMPARISON:  02/26/2022 and 02/24/2022 FINDINGS: Increased focal density along the periphery of the right upper lung. Slightly increased airspace densities in the medial aspect of the right upper lobe. Again noted are densities at the left lung base and the left hemidiaphragm is obscured. Findings could represent a combination of pleural fluid and consolidation at the left lung base. Heart size is stable. Trachea is midline. Negative for a pneumothorax. Nasogastric tube has been removed. Surgical clips in the right upper abdomen. IMPRESSION: 1. Increased airspace densities in the right upper lobe. Findings are concerning for pneumonia. 2. Left basilar densities could represent a combination of pleural fluid and airspace disease/consolidation. Electronically Signed   By: Markus Daft M.D.   On: 02/27/2022 08:58   DG ABD ACUTE 2+V W 1V CHEST  Result Date: 02/26/2022 CLINICAL DATA:  Aspiration.  History of small-bowel obstruction. EXAM: DG ABDOMEN ACUTE WITH 1 VIEW CHEST COMPARISON:  Abdominal radiograph 02/25/2022 FINDINGS: Enteric tube appears coiled within the stomach. Stable cardiomegaly and hilar prominence. Similar-appearing left-greater-than-right basilar airspace opacities which may represent atelectasis. Possible trace bilateral pleural effusions. No pneumothorax. Thoracic spine degenerative changes. Oral contrast material within the descending colon. There are a few gaseous distended loops of small bowel within the central abdomen. Surgical clips right upper quadrant. Right hip arthroplasty. Lumbar spine degenerative changes. IMPRESSION: 1. A few gaseous distended loops of small bowel within the central abdomen, similar to prior. 2. Bibasilar  atelectasis.  3. Oral contrast material within the descending colon. 4. Enteric tube appears coiled within the stomach. Electronically Signed   By: Lovey Newcomer M.D.   On: 02/26/2022 08:08   DG ABD ACUTE 2+V W 1V CHEST  Result Date: 02/25/2022 CLINICAL DATA:  Respiratory failure with hypoxia. Bowel obstruction. EXAM: DG ABDOMEN ACUTE WITH 1 VIEW CHEST COMPARISON:  02/24/2022 FINDINGS: Orogastric tube is seen with tip in the distal antrum or duodenal bulb. Oral contrast material is now seen within nondilated left colon. Decreased small bowel dilatation is seen since prior exam. Contrast noted in the urinary bladder from recent CT. Cardiomegaly is stable. Enlarged pulmonary arteries are consistent with pulmonary arterial hypertension. Increased opacity in both lung bases may be due to atelectasis or infiltrate. No evidence of pleural effusion or pneumothorax. IMPRESSION: Decreased small bowel dilatation since prior exam. Oral contrast material now seen in nondilated left colon. Increased bibasilar atelectasis versus infiltrate. Stable cardiomegaly and pulmonary arterial hypertension. Electronically Signed   By: Marlaine Hind M.D.   On: 02/25/2022 09:16   DG ABD ACUTE 2+V W 1V CHEST  Result Date: 02/24/2022 CLINICAL DATA:  88 year old female with resolving small bowel obstruction on CT yesterday, oral contrast reached the colon. EXAM: DG ABDOMEN ACUTE WITH 1 VIEW CHEST COMPARISON:  CT Chest, Abdomen, and Pelvis yesterday and earlier. FINDINGS: Portable upright AP view of the chest at 0719 hours. Enteric tube terminates in the distal stomach or duodenum, side hole at the level of the gastric body. Stable cholecystectomy clips. No pneumoperitoneum or pneumothorax. Stable lung bases and mediastinum. Portable upright and supine views the abdomen at 0719 hours. Retained oral contrast appears to be largely within the colon now from the transverse to the sigmoid. Excreted IV contrast in the urinary bladder. Superimposed  gas-filled small bowel loops up to 3.5 cm diameter are not significantly changed from the CT yesterday. Right hip arthroplasty.  No acute osseous abnormality identified. IMPRESSION: 1. Stable bowel gas pattern from the CT yesterday. Retained oral contrast now primarily in the colon. No free air. 2. Stable chest. Enteric tube terminates in the distal stomach or duodenum. Electronically Signed   By: Genevie Ann M.D.   On: 02/24/2022 09:21   CT CHEST W CONTRAST  Result Date: 02/23/2022 CLINICAL DATA:  Intractable emesis, partial small bowel obstruction versus ileus, abnormal density right upper chest EXAM: CT CHEST, ABDOMEN, AND PELVIS WITH CONTRAST TECHNIQUE: Multidetector CT imaging of the chest, abdomen and pelvis was performed following the standard protocol during bolus administration of intravenous contrast. RADIATION DOSE REDUCTION: This exam was performed according to the departmental dose-optimization program which includes automated exposure control, adjustment of the mA and/or kV according to patient size and/or use of iterative reconstruction technique. CONTRAST:  66m OMNIPAQUE IOHEXOL 300 MG/ML  SOLN COMPARISON:  02/23/2022, 02/21/2022 FINDINGS: CT CHEST FINDINGS Cardiovascular: Heart is enlarged without pericardial effusion. Extensive atherosclerosis of the coronary vasculature unchanged. No evidence of thoracic aortic aneurysm or dissection. Stable aortic atherosclerosis. Dilated main pulmonary trunk consistent with pulmonary arterial hypertension. Mediastinum/Nodes: Enteric catheter extends into the gastric lumen, tip in the region of the duodenal bulb. Thyroid and trachea are unremarkable. No pathologic adenopathy. Lungs/Pleura: No airspace disease, effusion, or pneumothorax. Density in the right upper chest on preceding x-ray likely reflect a superimposed vascular shadow given patient rotation. Central airways are patent. Musculoskeletal: There are no acute displaced fractures. Chronic compression  deformities are seen at T4, T5, and T11. Reconstructed images demonstrate no additional findings. CT ABDOMEN PELVIS FINDINGS  Hepatobiliary: No focal liver abnormality is seen. Status post cholecystectomy. No biliary dilatation. Pancreas: Pancreas is atrophic. No focal abnormalities or pancreatic duct dilation. Spleen: Normal in size without focal abnormality. Adrenals/Urinary Tract: Kidneys are unremarkable without urinary tract calculi or obstructive uropathy. The adrenals and bladder are grossly unremarkable. Evaluation of the bladder slightly limited due to streak artifact from right hip arthroplasty. Stomach/Bowel: There has been transit of oral contrast into the colon by the time of imaging, excluding high-grade obstruction. There are some residual dilated loops of small bowel within the left mid abdomen, which may be due to resolving obstruction or ileus. No bowel wall thickening or inflammatory change. Vascular/Lymphatic: Aortic atherosclerosis. No enlarged abdominal or pelvic lymph nodes. Reproductive: Status post hysterectomy. No adnexal masses. Other: No free fluid or free intraperitoneal gas. No abdominal wall hernia. Musculoskeletal: There are no acute or destructive bony lesions. Right hip arthroplasty is unremarkable. Reconstructed images demonstrate no additional findings. IMPRESSION: 1. Resolving small-bowel obstruction or ileus, with transit of oral contrast into the colon at the time of imaging. Minimal residual small bowel dilatation in the left mid abdomen. 2. No acute intrathoracic process. The density seen in the right upper chest on preceding x-ray is consistent with superimposed vascular shadows given patient rotation on that exam. 3. Cardiomegaly. 4. Aortic Atherosclerosis (ICD10-I70.0). Coronary artery atherosclerosis. Electronically Signed   By: Randa Ngo M.D.   On: 02/23/2022 16:45   CT ABDOMEN PELVIS W CONTRAST  Result Date: 02/23/2022 CLINICAL DATA:  Intractable emesis, partial  small bowel obstruction versus ileus, abnormal density right upper chest EXAM: CT CHEST, ABDOMEN, AND PELVIS WITH CONTRAST TECHNIQUE: Multidetector CT imaging of the chest, abdomen and pelvis was performed following the standard protocol during bolus administration of intravenous contrast. RADIATION DOSE REDUCTION: This exam was performed according to the departmental dose-optimization program which includes automated exposure control, adjustment of the mA and/or kV according to patient size and/or use of iterative reconstruction technique. CONTRAST:  13m OMNIPAQUE IOHEXOL 300 MG/ML  SOLN COMPARISON:  02/23/2022, 02/21/2022 FINDINGS: CT CHEST FINDINGS Cardiovascular: Heart is enlarged without pericardial effusion. Extensive atherosclerosis of the coronary vasculature unchanged. No evidence of thoracic aortic aneurysm or dissection. Stable aortic atherosclerosis. Dilated main pulmonary trunk consistent with pulmonary arterial hypertension. Mediastinum/Nodes: Enteric catheter extends into the gastric lumen, tip in the region of the duodenal bulb. Thyroid and trachea are unremarkable. No pathologic adenopathy. Lungs/Pleura: No airspace disease, effusion, or pneumothorax. Density in the right upper chest on preceding x-ray likely reflect a superimposed vascular shadow given patient rotation. Central airways are patent. Musculoskeletal: There are no acute displaced fractures. Chronic compression deformities are seen at T4, T5, and T11. Reconstructed images demonstrate no additional findings. CT ABDOMEN PELVIS FINDINGS Hepatobiliary: No focal liver abnormality is seen. Status post cholecystectomy. No biliary dilatation. Pancreas: Pancreas is atrophic. No focal abnormalities or pancreatic duct dilation. Spleen: Normal in size without focal abnormality. Adrenals/Urinary Tract: Kidneys are unremarkable without urinary tract calculi or obstructive uropathy. The adrenals and bladder are grossly unremarkable. Evaluation of the  bladder slightly limited due to streak artifact from right hip arthroplasty. Stomach/Bowel: There has been transit of oral contrast into the colon by the time of imaging, excluding high-grade obstruction. There are some residual dilated loops of small bowel within the left mid abdomen, which may be due to resolving obstruction or ileus. No bowel wall thickening or inflammatory change. Vascular/Lymphatic: Aortic atherosclerosis. No enlarged abdominal or pelvic lymph nodes. Reproductive: Status post hysterectomy. No adnexal masses. Other: No free  fluid or free intraperitoneal gas. No abdominal wall hernia. Musculoskeletal: There are no acute or destructive bony lesions. Right hip arthroplasty is unremarkable. Reconstructed images demonstrate no additional findings. IMPRESSION: 1. Resolving small-bowel obstruction or ileus, with transit of oral contrast into the colon at the time of imaging. Minimal residual small bowel dilatation in the left mid abdomen. 2. No acute intrathoracic process. The density seen in the right upper chest on preceding x-ray is consistent with superimposed vascular shadows given patient rotation on that exam. 3. Cardiomegaly. 4. Aortic Atherosclerosis (ICD10-I70.0). Coronary artery atherosclerosis. Electronically Signed   By: Randa Ngo M.D.   On: 02/23/2022 16:45   DG ABD ACUTE 2+V W 1V CHEST  Result Date: 02/23/2022 CLINICAL DATA:  Small-bowel obstruction. EXAM: DG ABDOMEN ACUTE WITH 1 VIEW CHEST COMPARISON:  February 21, 2022 FINDINGS: Enteric tube courses into the abdomen. Tip likely in the area of the proximal duodenum. Trachea midline. Cardiomediastinal contours and hilar structures are stable with signs of cardiomegaly. No consolidation. Subtle added density over the RIGHT upper chest. This is about the third and fourth ribs posteriorly and the clavicle, an area of significant overlap but is asymmetric to the other side. No pneumothorax. No free air beneath either the RIGHT or LEFT  hemidiaphragm. Scattered loops of gas-filled bowel throughout the abdomen. Degree of distension slightly improved compared to previous imaging still with some mild to moderate distension of bowel loops in the LEFT mid abdomen. Post partial colonic resection as before. Stool and gas scattered throughout the colon with gas in the rectum. Degenerative changes throughout the spine. RIGHT hip arthroplasty. Osteopenia. IMPRESSION: 1. Enteric tube courses into the abdomen. Tip likely in the area of the proximal duodenum. 2. Scattered loops of gas-filled bowel throughout the abdomen. Degree of distension slightly improved compared to previous imaging still with some mild to moderate distension of bowel loops in the LEFT mid abdomen. 3. No free air. 4. Added density over the RIGHT upper chest, image slightly rotated and could project vascular structures over the upper chest. Density is however more than expected just above the RIGHT hilum and underlying nodule up to 2.3 cm is considered. Given nonstandard positioning currently would suggest CT of the chest for further evaluation on follow-up. Electronically Signed   By: Zetta Bills M.D.   On: 02/23/2022 08:12   US Venous Img Lower Unilateral Right (DVT)  Result Date: 02/22/2022 CLINICAL DATA:  Right leg swelling EXAM: RIGHT LOWER EXTREMITY VENOUS DOPPLER ULTRASOUND TECHNIQUE: Gray-scale sonography with compression, as well as color and duplex ultrasound, were performed to evaluate the deep venous system(s) from the level of the common femoral vein through the popliteal and proximal calf veins. COMPARISON:  None available FINDINGS: VENOUS Normal compressibility of the common femoral, superficial femoral, and popliteal veins, as well as the visualized calf veins. Visualized portions of profunda femoral vein and great saphenous vein unremarkable. No filling defects to suggest DVT on grayscale or color Doppler imaging. Doppler waveforms show normal direction of venous  flow, normal respiratory plasticity and response to augmentation. Limited views of the contralateral common femoral vein are unremarkable. OTHER None. Limitations: none IMPRESSION: No right lower extremity DVT. Electronically Signed   By: Miachel Roux M.D.   On: 02/22/2022 13:22   DG Chest Portable 1 View  Result Date: 02/21/2022 CLINICAL DATA:  Enteric catheter placement EXAM: PORTABLE CHEST 1 VIEW COMPARISON:  08/18/2020 FINDINGS: Single frontal view of the chest demonstrates enteric catheter passing below diaphragm, tip excluded by collimation. Side  port projects over the gastric fundus. Cardiac silhouette is enlarged but stable. No airspace disease, effusion, or pneumothorax. No acute bony abnormality. IMPRESSION: 1. Enteric catheter coiled over the stomach, tip excluded by collimation. The side port projects over the gastric fundus. Electronically Signed   By: Randa Ngo M.D.   On: 02/21/2022 20:01   DG Abd Portable 1 View  Result Date: 02/21/2022 CLINICAL DATA:  Enteric catheter placement EXAM: PORTABLE ABDOMEN - 1 VIEW COMPARISON:  02/21/2022 FINDINGS: Frontal view of the lower chest and upper abdomen demonstrates enteric catheter passing below diaphragm, looped back upon itself with tip and side port extending retrograde in the region of the distal thoracic esophagus. Recommend removal and replacement. Lung bases are clear. Gaseous distention of the small bowel consistent with obstruction as seen on recent CT. IMPRESSION: 1. Enteric catheter as above, coiled back upon itself with tip projecting over the distal thoracic esophagus. Recommend removal and replacement. 2. Small bowel obstruction. Electronically Signed   By: Randa Ngo M.D.   On: 02/21/2022 18:34   CT Abdomen Pelvis W Contrast  Result Date: 02/21/2022 CLINICAL DATA:  Difficulty urinating. EXAM: CT ABDOMEN AND PELVIS WITH CONTRAST TECHNIQUE: Multidetector CT imaging of the abdomen and pelvis was performed using the standard  protocol following bolus administration of intravenous contrast. RADIATION DOSE REDUCTION: This exam was performed according to the departmental dose-optimization program which includes automated exposure control, adjustment of the mA and/or kV according to patient size and/or use of iterative reconstruction technique. CONTRAST:  134m OMNIPAQUE IOHEXOL 300 MG/ML  SOLN COMPARISON:  None Available. FINDINGS: Lower chest: No acute abnormality. Hepatobiliary: No focal liver abnormality is seen. Status post cholecystectomy. No biliary dilatation. Pancreas: Unremarkable. No pancreatic ductal dilatation or surrounding inflammatory changes. Spleen: Normal in size without focal abnormality. Adrenals/Urinary Tract: Adrenal glands are unremarkable. Kidneys are normal in size with mild areas of renal cortical thinning noted. There is no evidence of renal calculi or hydronephrosis. Bladder is unremarkable. Stomach/Bowel: Stomach is within normal limits. The appendix is not identified. Multiple dilated small bowel loops are seen throughout the abdomen and pelvis (maximum small bowel diameter of approximately 3.8 cm). A gradual transition zone is seen within the lateral aspect of the mid to lower right abdomen. Numerous noninflamed diverticula are seen throughout the sigmoid colon. Vascular/Lymphatic: Aortic atherosclerosis. No enlarged abdominal or pelvic lymph nodes. Reproductive: Status post hysterectomy. No adnexal masses. Other: No abdominal wall hernia or abnormality. No abdominopelvic ascites. Musculoskeletal: A total right hip replacement is seen with a marked amount of associated streak artifact. Subsequently limited evaluation of the adjacent osseous and soft tissue structures is noted. A chronic compression fracture deformity is seen at the level of T11. Multilevel degenerative changes are seen throughout the remainder of the lumbar spine. IMPRESSION: 1. Findings consistent with a partial small bowel obstruction versus  ileus. 2. Sigmoid diverticulosis. 3. Total right hip replacement. 4. Chronic compression fracture deformity at the level of T11. 5. Aortic atherosclerosis. Aortic Atherosclerosis (ICD10-I70.0). Electronically Signed   By: TVirgina NorfolkM.D.   On: 02/21/2022 17:12    DOrson Eva DO  [facility]  If 7PM-7AM, please contact night-coverage [email or web address] Password [email or web address] 03/09/2022, 8:46 AM   LOS: 1 day

## 2022-03-10 DIAGNOSIS — J690 Pneumonitis due to inhalation of food and vomit: Secondary | ICD-10-CM | POA: Diagnosis not present

## 2022-03-10 LAB — GLUCOSE, CAPILLARY
Glucose-Capillary: 114 mg/dL — ABNORMAL HIGH (ref 70–99)
Glucose-Capillary: 127 mg/dL — ABNORMAL HIGH (ref 70–99)
Glucose-Capillary: 143 mg/dL — ABNORMAL HIGH (ref 70–99)
Glucose-Capillary: 152 mg/dL — ABNORMAL HIGH (ref 70–99)
Glucose-Capillary: 153 mg/dL — ABNORMAL HIGH (ref 70–99)
Glucose-Capillary: 164 mg/dL — ABNORMAL HIGH (ref 70–99)

## 2022-03-10 LAB — CBC
HCT: 28.2 % — ABNORMAL LOW (ref 36.0–46.0)
Hemoglobin: 8.7 g/dL — ABNORMAL LOW (ref 12.0–15.0)
MCH: 30.9 pg (ref 26.0–34.0)
MCHC: 30.9 g/dL (ref 30.0–36.0)
MCV: 100 fL (ref 80.0–100.0)
Platelets: 223 10*3/uL (ref 150–400)
RBC: 2.82 MIL/uL — ABNORMAL LOW (ref 3.87–5.11)
RDW: 15.3 % (ref 11.5–15.5)
WBC: 22.5 10*3/uL — ABNORMAL HIGH (ref 4.0–10.5)
nRBC: 0 % (ref 0.0–0.2)

## 2022-03-10 LAB — BASIC METABOLIC PANEL
Anion gap: 9 (ref 5–15)
BUN: 25 mg/dL — ABNORMAL HIGH (ref 8–23)
CO2: 23 mmol/L (ref 22–32)
Calcium: 8.6 mg/dL — ABNORMAL LOW (ref 8.9–10.3)
Chloride: 122 mmol/L — ABNORMAL HIGH (ref 98–111)
Creatinine, Ser: 0.85 mg/dL (ref 0.44–1.00)
GFR, Estimated: >60 mL/min (ref >60)
Glucose, Bld: 175 mg/dL — ABNORMAL HIGH (ref 70–99)
Potassium: 3.8 mmol/L (ref 3.5–5.1)
Sodium: 154 mmol/L — ABNORMAL HIGH (ref 135–145)

## 2022-03-10 LAB — MAGNESIUM: Magnesium: 2.1 mg/dL (ref 1.7–2.4)

## 2022-03-10 LAB — BRAIN NATRIURETIC PEPTIDE: B Natriuretic Peptide: 297 pg/mL — ABNORMAL HIGH (ref 0.0–100.0)

## 2022-03-10 LAB — PROCALCITONIN: Procalcitonin: 1.33 ng/mL

## 2022-03-10 MED ORDER — ENOXAPARIN SODIUM 40 MG/0.4ML IJ SOSY
40.0000 mg | PREFILLED_SYRINGE | INTRAMUSCULAR | Status: DC
  Administered 2022-03-11 – 2022-03-13 (×3): 40 mg via SUBCUTANEOUS
  Filled 2022-03-10 (×3): qty 0.4

## 2022-03-10 MED ORDER — POTASSIUM CL IN DEXTROSE 5% 20 MEQ/L IV SOLN
20.0000 meq | INTRAVENOUS | Status: DC
  Administered 2022-03-10 – 2022-03-11 (×3): 20 meq via INTRAVENOUS

## 2022-03-10 NOTE — Progress Notes (Signed)
PROGRESS NOTE  Robin Mcclure IRJ:[id number] DOB: 1922/09/24 DOA: 03/08/2022 PCP: Lujean Amel, MD  Brief History:  88 year old female with history of diabetes mellitus type 2, COPD, hypertension, hyperlipidemia, GERD, and dysphagia presenting from [facility] secondary to altered level of consciousness and hypoxia.  The patient is unable to provide any significant history secondary to her extremis and altered mental status.  History is obtained from review of the medical record and speaking with the patient's daughter.  Notably, the patient was noted to have altered level consciousness when she was getting a nebulizer treatment at [facility] on 03/08/2022.  It was thought that the patient may have had a syncopal type episode when she was receiving a nebulizer treatment.  The patient had regained consciousness by the time EMS arrived, but she was noted to have oxygen saturation of 70%.  Chest x-ray earlier in the day showed right lower lobe opacity. Notably, the patient was recently hospitalized from 02/21/2022 to 03/05/2022 for small bowel obstruction versus ileus.  Her hospitalization was also complicated by aspiration pneumonia. In the ED, the patient had temperature up to 101.3 F.  She was hemodynamically stable.  She was placed on BiPAP with saturation 95-97%.  WBC 32.8, hemoglobin 9.0, platelets 261,000.  Sodium 133, potassium 3.4, bicarbonate 25, serum creatinine 1.03.  LFTs were unremarkable.  Troponin 120>> 114.  CTA chest was negative for PE but showed bilateral lower lobe consolidations with air bronchograms.  There are small bilateral pleural effusions.  There is a stable chronic T11 fracture.  The patient was initially started on cefepime.  This was changed to Zosyn.  The patient was placed on BiPAP and admitted to the stepdown unit.     Assessment and Plan: Sepsis -Present on admission -Presented with fever, leukocytosis, respiratory failure -Secondary to  pneumonia -Continue IV fluids -Continue IV Zosyn -blood cultures remain neg -PCT 1.44>>1.33   Aspiration pneumonia -MRSA screen--neg -Continue Zosyn -CTA chest negative for PE, shows bilateral lower lobe consolidation with air bronchograms   Acute respiratory failure with hypoxia -Secondary to pneumonia -Currently on BiPAP>>weaned to 5L HFNC -Wean as tolerated  Elevated troponin -troponin 120>>114 -due to demand ischemia in setting of sepsis   Diabetes mellitus type 2 -NovoLog sliding scale -02/22/2022 hemoglobin A1c 6.4 -Holding Januvia and metformin   Essential hypertension -Holding nifedipine secondary to soft blood pressure   COPD -Start DuoNebs   Hypernatremia -Started hypotonic fluid -Hold furosemide -change to D5W>>increase rate   Hyperlipidemia -Restart statin once able to tolerate p.o.   GOC -discussed with daughter>>confirms DNR -otherwise, continue full scope of care -daughter hopeful patient can stablize       Family Communication:   daughter updated 10/21   Consultants: none    Code Status:  DNR--confirmed with daughter 10/20   DVT Prophylaxis:   Lovenox     Procedures: As Listed in Progress Note Above   Antibiotics: Zosyn 10/20>>        Subjective: Pt is more awake, but remains confused.  Denies cp, abd pain.  Has sob.  Remainder ROS unobtainable due to encephalopathy  Objective: Vitals:   03/10/22 1100 03/10/22 1123 03/10/22 1200 03/10/22 1300  BP: (!) 107/43  (!) 93/33 (!) 108/47  Pulse: 75 65    Resp: (!) 35 (!) 27 (!) 24 (!) 23  Temp:  98.1 F (36.7 C)    TempSrc:  Axillary    SpO2: 100% 100% 99% 96%  Weight:  Height:        Intake/Output Summary (Last 24 hours) at 03/10/2022 1325 Last data filed at 03/10/2022 1321 Gross per 24 hour  Intake 1768.7 ml  Output 475 ml  Net 1293.7 ml   Weight change:  Exam:  General:  Pt is alert, occasionally follows commands appropriately, not in acute distress HEENT:  No icterus, No thrush, No neck mass, [place]/AT Cardiovascular: RRR, S1/S2, no rubs, no gallops Respiratory: bibasilar rhonchi Abdomen: Soft/+BS, non tender, non distended, no guarding Extremities: 1 +LE  edema, No lymphangitis, No petechiae, No rashes, no synovitis   Data Reviewed: I have personally reviewed following labs and imaging studies Basic Metabolic Panel: Recent Labs  Lab 03/04/22 0421 03/05/22 0354 03/08/22 1731 03/09/22 0553 03/10/22 0431  NA 147* 148* 149* 153* 154*  K 3.5 3.5 3.9 3.4* 3.8  CL 116* 116* 113* 117* 122*  CO2 '23 26 25 25 23  '$ GLUCOSE 200* 164* 250* 230* 175*  BUN '17 15 23 '$ 29* 25*  CREATININE 0.64 0.65 1.01* 1.03* 0.85  CALCIUM 9.6 9.5 9.0 8.9 8.6*  MG  --   --   --   --  2.1   Liver Function Tests: Recent Labs  Lab 03/08/22 1731 03/09/22 0553  AST 14* 10*  ALT 14 11  ALKPHOS 76 66  BILITOT 0.7 0.5  PROT 7.0 5.9*  ALBUMIN 2.5* 2.1*   Recent Labs  Lab 03/08/22 1731  LIPASE 23   No results for input(s): "AMMONIA" in the last 168 hours. Coagulation Profile: No results for input(s): "INR", "PROTIME" in the last 168 hours. CBC: Recent Labs  Lab 03/04/22 0421 03/05/22 0354 03/08/22 1731 03/09/22 0553 03/10/22 0431  WBC 11.9* 9.9 32.8* 28.5* 22.5*  NEUTROABS  --   --  29.8*  --   --   HGB 10.7* 9.5* 11.0* 10.0* 8.7*  HCT 32.8* 29.8* 35.3* 33.2* 28.2*  MCV 95.9 98.0 99.2 101.8* 100.0  PLT 245 232 261 241 223   Cardiac Enzymes: No results for input(s): "CKTOTAL", "CKMB", "CKMBINDEX", "TROPONINI" in the last 168 hours. BNP: Invalid input(s): "POCBNP" CBG: Recent Labs  Lab 03/09/22 1646 03/09/22 1918 03/09/22 2334 03/10/22 0422 03/10/22 0755  GLUCAP 154* 168* 150* 153* 143*   HbA1C: No results for input(s): "HGBA1C" in the last 72 hours. Urine analysis:    Component Value Date/Time   COLORURINE YELLOW 02/21/2022 1517   APPEARANCEUR HAZY (A) 02/21/2022 1517   LABSPEC 1.015 02/21/2022 1517   PHURINE 5.0 02/21/2022 1517    GLUCOSEU NEGATIVE 02/21/2022 1517   HGBUR NEGATIVE 02/21/2022 1517   BILIRUBINUR NEGATIVE 02/21/2022 1517   KETONESUR 20 (A) 02/21/2022 1517   PROTEINUR NEGATIVE 02/21/2022 1517   NITRITE NEGATIVE 02/21/2022 1517   LEUKOCYTESUR NEGATIVE 02/21/2022 1517   Sepsis Labs: '@LABRCNTIP'$ (procalcitonin:4,lacticidven:4) ) Recent Results (from the past 240 hour(s))  SARS Coronavirus 2 by RT PCR (hospital order, performed in [facility] hospital lab) *cepheid single result test* Anterior Nasal Swab     Status: None   Collection Time: 03/08/22  5:31 PM   Specimen: Anterior Nasal Swab  Result Value Ref Range Status   SARS Coronavirus 2 by RT PCR NEGATIVE NEGATIVE Final    Comment: (NOTE) SARS-CoV-2 target nucleic acids are NOT DETECTED.  The SARS-CoV-2 RNA is generally detectable in upper and lower respiratory specimens during the acute phase of infection. The lowest concentration of SARS-CoV-2 viral copies this assay can detect is 250 copies / mL. A negative result does not preclude SARS-CoV-2 infection and should  not be used as the sole basis for treatment or other patient management decisions.  A negative result may occur with improper specimen collection / handling, submission of specimen other than nasopharyngeal swab, presence of viral mutation(s) within the areas targeted by this assay, and inadequate number of viral copies (<250 copies / mL). A negative result must be combined with clinical observations, patient history, and epidemiological information.  Fact Sheet for Patients:   https://www.patel.info/  Fact Sheet for Healthcare Providers: https://hall.com/  This test is not yet approved or  cleared by the [place] FDA and has been authorized for detection and/or diagnosis of SARS-CoV-2 by FDA under an Emergency Use Authorization (EUA).  This EUA will remain in effect (meaning this test can be used) for the duration of the COVID-19  declaration under Section 564(b)(1) of the Act, 21 U.S.C. section 360bbb-3(b)(1), unless the authorization is terminated or revoked sooner.  Performed at [facility], [address], [place], [place] [address]   Blood culture (routine x 2)     Status: None (Preliminary result)   Collection Time: 03/08/22  5:31 PM   Specimen: BLOOD  Result Value Ref Range Status   Specimen Description BLOOD BLOOD LEFT ARM  Final   Special Requests BLOOD  Final   Culture   Final    NO GROWTH 2 DAYS Performed at [facility], [address] [place], [place] [address]    Report Status PENDING  Incomplete  Blood culture (routine x 2)     Status: None (Preliminary result)   Collection Time: 03/08/22  5:40 PM   Specimen: BLOOD  Result Value Ref Range Status   Specimen Description BLOOD LEFT ANTECUBITAL  Final   Special Requests   Final    BOTTLES DRAWN AEROBIC AND ANAEROBIC Blood Culture adequate volume   Culture   Final    NO GROWTH 2 DAYS Performed at [facility], [address], [place], [place] [address]    Report Status PENDING  Incomplete  MRSA Next Gen by PCR, Nasal     Status: None   Collection Time: 03/09/22  8:20 AM   Specimen: Nasal Mucosa; Nasal Swab  Result Value Ref Range Status   MRSA by PCR Next Gen NOT DETECTED NOT DETECTED Final    Comment: (NOTE) The GeneXpert MRSA Assay (FDA approved for NASAL specimens only), is one component of a comprehensive MRSA colonization surveillance program. It is not intended to diagnose MRSA infection nor to guide or monitor treatment for MRSA infections. Test performance is not FDA approved in patients less than 63 years old. Performed at [facility], [address] [place], [place] [address]      Scheduled Meds:  Chlorhexidine Gluconate Cloth  6 each Topical Daily   enoxaparin (LOVENOX) injection  30 mg Subcutaneous Q24H   insulin aspart  0-9 Units Subcutaneous Q4H   mouth rinse  15 mL Mouth Rinse 4 times per day   pantoprazole (PROTONIX)  IV  40 mg Intravenous Q24H   Continuous Infusions:  dextrose 5 % and 0.45 % NaCl with KCl 20 mEq/L 75 mL/hr at 03/10/22 0809   piperacillin-tazobactam (ZOSYN)  IV Stopped (03/10/22 1137)    Procedures/Studies: CT Angio Chest PE W and/or Wo Contrast  Result Date: 03/08/2022 CLINICAL DATA:  Syncopal episode and low oxygen saturation. EXAM: CT ANGIOGRAPHY CHEST WITH CONTRAST TECHNIQUE: Multidetector CT imaging of the chest was performed using the standard protocol during bolus administration of intravenous contrast. Multiplanar CT image reconstructions and MIPs were obtained  to evaluate the vascular anatomy. RADIATION DOSE REDUCTION: This exam was performed according to the departmental dose-optimization program which includes automated exposure control, adjustment of the mA and/or kV according to patient size and/or use of iterative reconstruction technique. CONTRAST:  52m OMNIPAQUE IOHEXOL 350 MG/ML SOLN COMPARISON:  02/23/2022 FINDINGS: Cardiovascular: The heart is mildly enlarged but stable. Moderate tortuosity of the thoracic aorta but no focal aneurysm or dissection. Stable aortic and coronary artery calcifications. Stable marked enlargement of the pulmonary arteries suggesting pulmonary hypertension. No filling defects to suggest pulmonary embolism. Mediastinum/Nodes: No mediastinal or hilar mass or adenopathy. The esophagus is grossly. Lungs/Pleura: Small bilateral pleural effusions. Dense bilateral lower lobe airspace consolidation and air bronchograms likely pulmonary infiltrates. Aspiration would certainly be a consideration. There are also patchy infiltrates in both upper lobes. No pneumothorax. Upper Abdomen: No significant upper abdominal findings. Musculoskeletal: No breast masses, supraclavicular or axillary adenopathy. The bony thorax is stable. Stable severe thoracic kyphosis with midthoracic compression fractures and stable vertebral plana deformity at T11. Review of the MIP images  confirms the above findings. IMPRESSION: 1. No CT findings for pulmonary embolism. 2. Stable tortuosity and calcification of the thoracic aorta but no focal aneurysm or dissection. 3. Stable marked enlargement of the pulmonary arteries suggesting pulmonary hypertension. 4. Dense bilateral lower lobe airspace consolidation and air bronchograms likely pulmonary infiltrates. Aspiration would certainly be a consideration. 5. Small bilateral pleural effusions. 6. Stable age advanced atherosclerotic calcifications involving the aorta and coronary arteries. 7. Stable thoracic compression fractures and vertebral plana deformity at T11. 8. Aortic atherosclerosis. Aortic Atherosclerosis (ICD10-I70.0). Electronically Signed   By: PMarijo SanesM.D.   On: 03/08/2022 21:35   DG Chest Port 1 View  Result Date: 03/08/2022 CLINICAL DATA:  Shortness of breath. EXAM: PORTABLE CHEST 1 VIEW COMPARISON:  03/01/2022 FINDINGS: The heart is borderline enlarged but stable. The mediastinal and hilar contours are within normal limits and unchanged. Tortuosity and calcification of the thoracic aorta. Chronic underlying lung changes with areas of pulmonary scarring. Small bilateral pleural effusions noted with bibasilar atelectasis. No obvious infiltrates. IMPRESSION: Chronic underlying lung disease with small bilateral pleural effusions and bibasilar atelectasis. Electronically Signed   By: PMarijo SanesM.D.   On: 03/08/2022 18:02   [place]CHEST (PLEURAL EFFUSION)  Result Date: 03/01/2022 CLINICAL DATA:  LEFT pleural effusion EXAM: CHEST ULTRASOUND COMPARISON:  Chest radiograph 03/01/2022 FINDINGS: Small LEFT pleural effusion is identified. Based on patient age/size and condition, this is insufficient for thoracentesis. Atelectasis in LEFT lower lobe. IMPRESSION: Small LEFT pleural effusion insufficient for thoracentesis. Electronically Signed   By: MLavonia DanaM.D.   On: 03/01/2022 12:12   DG CHEST PORT 1 VIEW  Result Date:  03/01/2022 CLINICAL DATA:  Shortness of breath. EXAM: PORTABLE CHEST 1 VIEW COMPARISON:  02/27/2022 FINDINGS: Stable cardiomediastinal contours. Bilateral pleural effusions are noted, left greater than right. Persistent complete retrocardiac opacification of the left lung base. Again noted are patchy bilateral upper lobe and right lung base opacities. These are unchanged compared with the previous exam. IMPRESSION: 1. No change in aeration to the lungs compared with previous exam. 2. Persistent bilateral pleural effusions, left greater than right. Electronically Signed   By: TKerby MoorsM.D.   On: 03/01/2022 08:22   DG ABD ACUTE 2+V W 1V CHEST  Result Date: 02/28/2022 CLINICAL DATA:  Shortness of breath. EXAM: DG ABDOMEN ACUTE WITH 1 VIEW CHEST COMPARISON:  Radiographs 02/27/2022 FINDINGS: Progressive pulmonary infiltrates and probable small bilateral pleural effusions. Contrast noted  throughout the colon from a recent swallowing function test. No findings for small bowel obstruction or free air. IMPRESSION: 1. Progressive pulmonary infiltrates and probable small bilateral pleural effusions. 2. No acute abdominal findings. Electronically Signed   By: Marijo Sanes M.D.   On: 02/28/2022 14:36   DG SWALLOW FUNC SPEECH PATH  Result Date: 02/27/2022 Table formatting from the original result was not included. Objective Swallowing Evaluation: Type of Study: MBS-Modified Barium Swallow Study  Patient Details Name: Teriyah Purington MRN: [id number] Date of Birth: 1922/10/31 Today's Date: 02/27/2022 Time: SLP Start Time (ACUTE ONLY): 1400 -SLP Stop Time (ACUTE ONLY): 1517 SLP Time Calculation (min) (ACUTE ONLY): 33 min Past Medical History: Past Medical History: Diagnosis Date  Diabetes mellitus without complication ([place])   Hyperlipidemia   Hypertension  Past Surgical History: Past Surgical History: Procedure Laterality Date  ABDOMINAL HYSTERECTOMY    BIOPSY  01/19/2018  Procedure: BIOPSY;  Surgeon: Ronnette Juniper, MD;   Location[facility] ENDOSCOPY;  Service: Gastroenterology;;  CHOLECYSTECTOMY    ESOPHAGOGASTRODUODENOSCOPY (EGD) WITH PROPOFOL N/A 01/19/2018  Procedure: ESOPHAGOGASTRODUODENOSCOPY (EGD) WITH PROPOFOL;  Surgeon: Ronnette Juniper, MD;  Location: [facility];  Service: Gastroenterology;  Laterality: N/A;  HIP SURGERY   HPI: 88 y.o. female with medical history significant of COPD, GERD, hypertension, hyperlipidemia, T2DM admitted on 02/21/2022 with intractable emesis and found to have partial small bowel obstruction  Versus ileus. in the early hours of 02/24/2022 patient developed significant respiratory distress with hypoxia O2 sats down to the 60s  -Required deep suctioning, bronchodilators, high flow oxygen  Arterial Blood Gas result:  pO2 44; pCO2 48; pH 7.3;  HCO3 23.6, %O2 Sat 76.8 on 8 L of [place]  -Currently requiring up to 11 L of oxygen via nasal cannula. BSE requested.  Subjective: "I am getting so tired."  Recommendations for follow up therapy are one component of a multi-disciplinary discharge planning process, led by the attending physician.  Recommendations may be updated based on patient status, additional functional criteria and insurance authorization. Assessment / Plan / Recommendation   02/27/2022   4:00 PM Clinical Impressions Clinical Impression Pt presents with mi/mod oropharyngeal and suspected primary esophageal dysphagia characterized by weak lingual manipulation and prolonged oral transit, swallow trigger at the level of the valleculae, decreased tongue base retraction and epiglottic deflection which appeared to be negatively impacted by elongated uvulae that nearly meat the tip of the epiglottis and impaired epiglottic deflection and approximation with posterior pharyngeal wall resulting in reduced laryngeal vestibule closure and penetration of thins during the swallow and penetration/aspiration to the vocal folds (difficult to see if below vocal folds at times) in trace amounts and resulting vallecular  residue and occasional contrast behind the uvulae. Epiglottis deflected greater with puree. Nectars only spilled over into the laryngeal vestibule in trace amounts, but also resulted in vallecular residue. Esophageal sweep revealed extensive barium contrast diffusely throughout the esophagus that resulted in some backflow/retrograde movement to the cervical esophagus. Pt still currently requires high supplemental oxygen and fatigues quickly, which increases risk for aspiration and decreases her ability to meet nutritional needs. Pt's daughter indicates that Pt was on nectar thickened liquids about 1-2 years ago and "did not like it". SLP reviewed imaging with Pt's daughter and suggested that we change her to NTL for now, with plan to advance her once she is clinically stronger. Daughter is willing to try this. Question whether Pt's appearance of elongated uvula could be due to some swelling from recent NG placement? Recommend D1/puree and NTL via  cup/straw with pacing strategies due to shortness of breath and fatigue and ensure that Pt is sitting upright for all eating/drinking and remains up for at least 30 minutes after meals due to esophageal dsymotility. SLP will follow during acute stay. SLP Visit Diagnosis Dysphagia, pharyngoesophageal phase (R13.14);Dysphagia, oropharyngeal phase (R13.12) Impact on safety and function Mild aspiration risk;Risk for inadequate nutrition/hydration     02/27/2022   4:00 PM Treatment Recommendations Treatment Recommendations Therapy as outlined in treatment plan below     02/27/2022   4:00 PM Prognosis Prognosis for Safe Diet Advancement Good   02/27/2022   4:00 PM Diet Recommendations SLP Diet Recommendations Dysphagia 1 (Puree) solids;Nectar thick liquid Liquid Administration via Cup;Straw Medication Administration Whole meds with puree Compensations Slow rate;Small sips/bites;Multiple dry swallows after each bite/sip Postural Changes Remain semi-upright after after feeds/meals  (Comment);Seated upright at 90 degrees     02/27/2022   4:00 PM Other Recommendations Recommended Consults Consider esophageal assessment Oral Care Recommendations Oral care BID;Staff/trained caregiver to provide oral care Other Recommendations Clarify dietary restrictions;Order thickener from pharmacy;Prohibited food (jello, ice cream, thin soups) Follow Up Recommendations Skilled nursing-short term rehab (<3 hours/day) Assistance recommended at discharge Frequent or constant Supervision/Assistance Functional Status Assessment Patient has had a recent decline in their functional status and demonstrates the ability to make significant improvements in function in a reasonable and predictable amount of time.   02/27/2022   4:00 PM Frequency and Duration  Speech Therapy Frequency (ACUTE ONLY) min 2x/week Treatment Duration 1 week     02/27/2022   4:00 PM Oral Phase Oral Phase Impaired Oral - Mech Soft Weak lingual manipulation;Delayed oral transit;Piecemeal swallowing    02/27/2022   4:00 PM Pharyngeal Phase Pharyngeal Phase Impaired Pharyngeal- Nectar Straw Delayed swallow initiation-vallecula;Reduced epiglottic inversion;Penetration/Aspiration during swallow;Pharyngeal residue - valleculae Pharyngeal Material enters airway, remains ABOVE vocal cords then ejected out Pharyngeal- Thin Teaspoon Delayed swallow initiation-vallecula;Reduced epiglottic inversion;Reduced tongue base retraction;Penetration/Aspiration during swallow;Penetration/Apiration after swallow;Pharyngeal residue - valleculae Pharyngeal Material enters airway, CONTACTS cords and not ejected out;Material enters airway, remains ABOVE vocal cords and not ejected out Pharyngeal- Thin Cup Delayed swallow initiation-vallecula;Reduced epiglottic inversion;Reduced airway/laryngeal closure;Reduced tongue base retraction;Penetration/Aspiration during swallow;Penetration/Apiration after swallow;Trace aspiration;Pharyngeal residue - valleculae Pharyngeal Material  enters airway, CONTACTS cords and not ejected out Pharyngeal- Thin Straw Delayed swallow initiation-vallecula;Delayed swallow initiation-pyriform sinuses;Reduced tongue base retraction;Reduced epiglottic inversion;Penetration/Aspiration during swallow;Pharyngeal residue - valleculae Pharyngeal Material enters airway, remains ABOVE vocal cords and not ejected out Pharyngeal- Puree Delayed swallow initiation-vallecula;Reduced tongue base retraction;Reduced epiglottic inversion;Pharyngeal residue - valleculae Pharyngeal- Mechanical Soft Reduced epiglottic inversion;Reduced tongue base retraction;Delayed swallow initiation-vallecula;Pharyngeal residue - valleculae Pharyngeal- Pill WFL;Delayed swallow initiation-vallecula    02/27/2022   4:00 PM Cervical Esophageal Phase  Cervical Esophageal Phase Impaired Puree Esophageal backflow into cervical esophagus Thank you, Genene Churn, [place] 02/27/2022, 5:27 PM                     DG CHEST PORT 1 VIEW  Result Date: 02/27/2022 CLINICAL DATA:  Aspiration into airway. EXAM: PORTABLE CHEST 1 VIEW COMPARISON:  02/26/2022 and 02/24/2022 FINDINGS: Increased focal density along the periphery of the right upper lung. Slightly increased airspace densities in the medial aspect of the right upper lobe. Again noted are densities at the left lung base and the left hemidiaphragm is obscured. Findings could represent a combination of pleural fluid and consolidation at the left lung base. Heart size is stable. Trachea is midline. Negative for a pneumothorax. Nasogastric tube has been  removed. Surgical clips in the right upper abdomen. IMPRESSION: 1. Increased airspace densities in the right upper lobe. Findings are concerning for pneumonia. 2. Left basilar densities could represent a combination of pleural fluid and airspace disease/consolidation. Electronically Signed   By: Markus Daft M.D.   On: 02/27/2022 08:58   DG ABD ACUTE 2+V W 1V CHEST  Result  Date: 02/26/2022 CLINICAL DATA:  Aspiration.  History of small-bowel obstruction. EXAM: DG ABDOMEN ACUTE WITH 1 VIEW CHEST COMPARISON:  Abdominal radiograph 02/25/2022 FINDINGS: Enteric tube appears coiled within the stomach. Stable cardiomegaly and hilar prominence. Similar-appearing left-greater-than-right basilar airspace opacities which may represent atelectasis. Possible trace bilateral pleural effusions. No pneumothorax. Thoracic spine degenerative changes. Oral contrast material within the descending colon. There are a few gaseous distended loops of small bowel within the central abdomen. Surgical clips right upper quadrant. Right hip arthroplasty. Lumbar spine degenerative changes. IMPRESSION: 1. A few gaseous distended loops of small bowel within the central abdomen, similar to prior. 2. Bibasilar atelectasis. 3. Oral contrast material within the descending colon. 4. Enteric tube appears coiled within the stomach. Electronically Signed   By: Lovey Newcomer M.D.   On: 02/26/2022 08:08   DG ABD ACUTE 2+V W 1V CHEST  Result Date: 02/25/2022 CLINICAL DATA:  Respiratory failure with hypoxia. Bowel obstruction. EXAM: DG ABDOMEN ACUTE WITH 1 VIEW CHEST COMPARISON:  02/24/2022 FINDINGS: Orogastric tube is seen with tip in the distal antrum or duodenal bulb. Oral contrast material is now seen within nondilated left colon. Decreased small bowel dilatation is seen since prior exam. Contrast noted in the urinary bladder from recent CT. Cardiomegaly is stable. Enlarged pulmonary arteries are consistent with pulmonary arterial hypertension. Increased opacity in both lung bases may be due to atelectasis or infiltrate. No evidence of pleural effusion or pneumothorax. IMPRESSION: Decreased small bowel dilatation since prior exam. Oral contrast material now seen in nondilated left colon. Increased bibasilar atelectasis versus infiltrate. Stable cardiomegaly and pulmonary arterial hypertension. Electronically Signed   By:  Marlaine Hind M.D.   On: 02/25/2022 09:16   DG ABD ACUTE 2+V W 1V CHEST  Result Date: 02/24/2022 CLINICAL DATA:  88 year old female with resolving small bowel obstruction on CT yesterday, oral contrast reached the colon. EXAM: DG ABDOMEN ACUTE WITH 1 VIEW CHEST COMPARISON:  CT Chest, Abdomen, and Pelvis yesterday and earlier. FINDINGS: Portable upright AP view of the chest at 0719 hours. Enteric tube terminates in the distal stomach or duodenum, side hole at the level of the gastric body. Stable cholecystectomy clips. No pneumoperitoneum or pneumothorax. Stable lung bases and mediastinum. Portable upright and supine views the abdomen at 0719 hours. Retained oral contrast appears to be largely within the colon now from the transverse to the sigmoid. Excreted IV contrast in the urinary bladder. Superimposed gas-filled small bowel loops up to 3.5 cm diameter are not significantly changed from the CT yesterday. Right hip arthroplasty.  No acute osseous abnormality identified. IMPRESSION: 1. Stable bowel gas pattern from the CT yesterday. Retained oral contrast now primarily in the colon. No free air. 2. Stable chest. Enteric tube terminates in the distal stomach or duodenum. Electronically Signed   By: Genevie Ann M.D.   On: 02/24/2022 09:21   CT CHEST W CONTRAST  Result Date: 02/23/2022 CLINICAL DATA:  Intractable emesis, partial small bowel obstruction versus ileus, abnormal density right upper chest EXAM: CT CHEST, ABDOMEN, AND PELVIS WITH CONTRAST TECHNIQUE: Multidetector CT imaging of the chest, abdomen and pelvis was performed following the standard  protocol during bolus administration of intravenous contrast. RADIATION DOSE REDUCTION: This exam was performed according to the departmental dose-optimization program which includes automated exposure control, adjustment of the mA and/or kV according to patient size and/or use of iterative reconstruction technique. CONTRAST:  45m OMNIPAQUE IOHEXOL 300 MG/ML  SOLN  COMPARISON:  02/23/2022, 02/21/2022 FINDINGS: CT CHEST FINDINGS Cardiovascular: Heart is enlarged without pericardial effusion. Extensive atherosclerosis of the coronary vasculature unchanged. No evidence of thoracic aortic aneurysm or dissection. Stable aortic atherosclerosis. Dilated main pulmonary trunk consistent with pulmonary arterial hypertension. Mediastinum/Nodes: Enteric catheter extends into the gastric lumen, tip in the region of the duodenal bulb. Thyroid and trachea are unremarkable. No pathologic adenopathy. Lungs/Pleura: No airspace disease, effusion, or pneumothorax. Density in the right upper chest on preceding x-ray likely reflect a superimposed vascular shadow given patient rotation. Central airways are patent. Musculoskeletal: There are no acute displaced fractures. Chronic compression deformities are seen at T4, T5, and T11. Reconstructed images demonstrate no additional findings. CT ABDOMEN PELVIS FINDINGS Hepatobiliary: No focal liver abnormality is seen. Status post cholecystectomy. No biliary dilatation. Pancreas: Pancreas is atrophic. No focal abnormalities or pancreatic duct dilation. Spleen: Normal in size without focal abnormality. Adrenals/Urinary Tract: Kidneys are unremarkable without urinary tract calculi or obstructive uropathy. The adrenals and bladder are grossly unremarkable. Evaluation of the bladder slightly limited due to streak artifact from right hip arthroplasty. Stomach/Bowel: There has been transit of oral contrast into the colon by the time of imaging, excluding high-grade obstruction. There are some residual dilated loops of small bowel within the left mid abdomen, which may be due to resolving obstruction or ileus. No bowel wall thickening or inflammatory change. Vascular/Lymphatic: Aortic atherosclerosis. No enlarged abdominal or pelvic lymph nodes. Reproductive: Status post hysterectomy. No adnexal masses. Other: No free fluid or free intraperitoneal gas. No  abdominal wall hernia. Musculoskeletal: There are no acute or destructive bony lesions. Right hip arthroplasty is unremarkable. Reconstructed images demonstrate no additional findings. IMPRESSION: 1. Resolving small-bowel obstruction or ileus, with transit of oral contrast into the colon at the time of imaging. Minimal residual small bowel dilatation in the left mid abdomen. 2. No acute intrathoracic process. The density seen in the right upper chest on preceding x-ray is consistent with superimposed vascular shadows given patient rotation on that exam. 3. Cardiomegaly. 4. Aortic Atherosclerosis (ICD10-I70.0). Coronary artery atherosclerosis. Electronically Signed   By: MRanda NgoM.D.   On: 02/23/2022 16:45   CT ABDOMEN PELVIS W CONTRAST  Result Date: 02/23/2022 CLINICAL DATA:  Intractable emesis, partial small bowel obstruction versus ileus, abnormal density right upper chest EXAM: CT CHEST, ABDOMEN, AND PELVIS WITH CONTRAST TECHNIQUE: Multidetector CT imaging of the chest, abdomen and pelvis was performed following the standard protocol during bolus administration of intravenous contrast. RADIATION DOSE REDUCTION: This exam was performed according to the departmental dose-optimization program which includes automated exposure control, adjustment of the mA and/or kV according to patient size and/or use of iterative reconstruction technique. CONTRAST:  564mOMNIPAQUE IOHEXOL 300 MG/ML  SOLN COMPARISON:  02/23/2022, 02/21/2022 FINDINGS: CT CHEST FINDINGS Cardiovascular: Heart is enlarged without pericardial effusion. Extensive atherosclerosis of the coronary vasculature unchanged. No evidence of thoracic aortic aneurysm or dissection. Stable aortic atherosclerosis. Dilated main pulmonary trunk consistent with pulmonary arterial hypertension. Mediastinum/Nodes: Enteric catheter extends into the gastric lumen, tip in the region of the duodenal bulb. Thyroid and trachea are unremarkable. No pathologic  adenopathy. Lungs/Pleura: No airspace disease, effusion, or pneumothorax. Density in the right upper chest on preceding x-ray  likely reflect a superimposed vascular shadow given patient rotation. Central airways are patent. Musculoskeletal: There are no acute displaced fractures. Chronic compression deformities are seen at T4, T5, and T11. Reconstructed images demonstrate no additional findings. CT ABDOMEN PELVIS FINDINGS Hepatobiliary: No focal liver abnormality is seen. Status post cholecystectomy. No biliary dilatation. Pancreas: Pancreas is atrophic. No focal abnormalities or pancreatic duct dilation. Spleen: Normal in size without focal abnormality. Adrenals/Urinary Tract: Kidneys are unremarkable without urinary tract calculi or obstructive uropathy. The adrenals and bladder are grossly unremarkable. Evaluation of the bladder slightly limited due to streak artifact from right hip arthroplasty. Stomach/Bowel: There has been transit of oral contrast into the colon by the time of imaging, excluding high-grade obstruction. There are some residual dilated loops of small bowel within the left mid abdomen, which may be due to resolving obstruction or ileus. No bowel wall thickening or inflammatory change. Vascular/Lymphatic: Aortic atherosclerosis. No enlarged abdominal or pelvic lymph nodes. Reproductive: Status post hysterectomy. No adnexal masses. Other: No free fluid or free intraperitoneal gas. No abdominal wall hernia. Musculoskeletal: There are no acute or destructive bony lesions. Right hip arthroplasty is unremarkable. Reconstructed images demonstrate no additional findings. IMPRESSION: 1. Resolving small-bowel obstruction or ileus, with transit of oral contrast into the colon at the time of imaging. Minimal residual small bowel dilatation in the left mid abdomen. 2. No acute intrathoracic process. The density seen in the right upper chest on preceding x-ray is consistent with superimposed vascular shadows  given patient rotation on that exam. 3. Cardiomegaly. 4. Aortic Atherosclerosis (ICD10-I70.0). Coronary artery atherosclerosis. Electronically Signed   By: Randa Ngo M.D.   On: 02/23/2022 16:45   DG ABD ACUTE 2+V W 1V CHEST  Result Date: 02/23/2022 CLINICAL DATA:  Small-bowel obstruction. EXAM: DG ABDOMEN ACUTE WITH 1 VIEW CHEST COMPARISON:  February 21, 2022 FINDINGS: Enteric tube courses into the abdomen. Tip likely in the area of the proximal duodenum. Trachea midline. Cardiomediastinal contours and hilar structures are stable with signs of cardiomegaly. No consolidation. Subtle added density over the RIGHT upper chest. This is about the third and fourth ribs posteriorly and the clavicle, an area of significant overlap but is asymmetric to the other side. No pneumothorax. No free air beneath either the RIGHT or LEFT hemidiaphragm. Scattered loops of gas-filled bowel throughout the abdomen. Degree of distension slightly improved compared to previous imaging still with some mild to moderate distension of bowel loops in the LEFT mid abdomen. Post partial colonic resection as before. Stool and gas scattered throughout the colon with gas in the rectum. Degenerative changes throughout the spine. RIGHT hip arthroplasty. Osteopenia. IMPRESSION: 1. Enteric tube courses into the abdomen. Tip likely in the area of the proximal duodenum. 2. Scattered loops of gas-filled bowel throughout the abdomen. Degree of distension slightly improved compared to previous imaging still with some mild to moderate distension of bowel loops in the LEFT mid abdomen. 3. No free air. 4. Added density over the RIGHT upper chest, image slightly rotated and could project vascular structures over the upper chest. Density is however more than expected just above the RIGHT hilum and underlying nodule up to 2.3 cm is considered. Given nonstandard positioning currently would suggest CT of the chest for further evaluation on follow-up.  Electronically Signed   By: Zetta Bills M.D.   On: 02/23/2022 08:12   US Venous Img Lower Unilateral Right (DVT)  Result Date: 02/22/2022 CLINICAL DATA:  Right leg swelling EXAM: RIGHT LOWER EXTREMITY VENOUS DOPPLER ULTRASOUND TECHNIQUE:  Gray-scale sonography with compression, as well as color and duplex ultrasound, were performed to evaluate the deep venous system(s) from the level of the common femoral vein through the popliteal and proximal calf veins. COMPARISON:  None available FINDINGS: VENOUS Normal compressibility of the common femoral, superficial femoral, and popliteal veins, as well as the visualized calf veins. Visualized portions of profunda femoral vein and great saphenous vein unremarkable. No filling defects to suggest DVT on grayscale or color Doppler imaging. Doppler waveforms show normal direction of venous flow, normal respiratory plasticity and response to augmentation. Limited views of the contralateral common femoral vein are unremarkable. OTHER None. Limitations: none IMPRESSION: No right lower extremity DVT. Electronically Signed   By: Miachel Roux M.D.   On: 02/22/2022 13:22   DG Chest Portable 1 View  Result Date: 02/21/2022 CLINICAL DATA:  Enteric catheter placement EXAM: PORTABLE CHEST 1 VIEW COMPARISON:  08/18/2020 FINDINGS: Single frontal view of the chest demonstrates enteric catheter passing below diaphragm, tip excluded by collimation. Side port projects over the gastric fundus. Cardiac silhouette is enlarged but stable. No airspace disease, effusion, or pneumothorax. No acute bony abnormality. IMPRESSION: 1. Enteric catheter coiled over the stomach, tip excluded by collimation. The side port projects over the gastric fundus. Electronically Signed   By: Randa Ngo M.D.   On: 02/21/2022 20:01   DG Abd Portable 1 View  Result Date: 02/21/2022 CLINICAL DATA:  Enteric catheter placement EXAM: PORTABLE ABDOMEN - 1 VIEW COMPARISON:  02/21/2022 FINDINGS: Frontal view of  the lower chest and upper abdomen demonstrates enteric catheter passing below diaphragm, looped back upon itself with tip and side port extending retrograde in the region of the distal thoracic esophagus. Recommend removal and replacement. Lung bases are clear. Gaseous distention of the small bowel consistent with obstruction as seen on recent CT. IMPRESSION: 1. Enteric catheter as above, coiled back upon itself with tip projecting over the distal thoracic esophagus. Recommend removal and replacement. 2. Small bowel obstruction. Electronically Signed   By: Randa Ngo M.D.   On: 02/21/2022 18:34   CT Abdomen Pelvis W Contrast  Result Date: 02/21/2022 CLINICAL DATA:  Difficulty urinating. EXAM: CT ABDOMEN AND PELVIS WITH CONTRAST TECHNIQUE: Multidetector CT imaging of the abdomen and pelvis was performed using the standard protocol following bolus administration of intravenous contrast. RADIATION DOSE REDUCTION: This exam was performed according to the departmental dose-optimization program which includes automated exposure control, adjustment of the mA and/or kV according to patient size and/or use of iterative reconstruction technique. CONTRAST:  140m OMNIPAQUE IOHEXOL 300 MG/ML  SOLN COMPARISON:  None Available. FINDINGS: Lower chest: No acute abnormality. Hepatobiliary: No focal liver abnormality is seen. Status post cholecystectomy. No biliary dilatation. Pancreas: Unremarkable. No pancreatic ductal dilatation or surrounding inflammatory changes. Spleen: Normal in size without focal abnormality. Adrenals/Urinary Tract: Adrenal glands are unremarkable. Kidneys are normal in size with mild areas of renal cortical thinning noted. There is no evidence of renal calculi or hydronephrosis. Bladder is unremarkable. Stomach/Bowel: Stomach is within normal limits. The appendix is not identified. Multiple dilated small bowel loops are seen throughout the abdomen and pelvis (maximum small bowel diameter of  approximately 3.8 cm). A gradual transition zone is seen within the lateral aspect of the mid to lower right abdomen. Numerous noninflamed diverticula are seen throughout the sigmoid colon. Vascular/Lymphatic: Aortic atherosclerosis. No enlarged abdominal or pelvic lymph nodes. Reproductive: Status post hysterectomy. No adnexal masses. Other: No abdominal wall hernia or abnormality. No abdominopelvic ascites. Musculoskeletal: A total right  hip replacement is seen with a marked amount of associated streak artifact. Subsequently limited evaluation of the adjacent osseous and soft tissue structures is noted. A chronic compression fracture deformity is seen at the level of T11. Multilevel degenerative changes are seen throughout the remainder of the lumbar spine. IMPRESSION: 1. Findings consistent with a partial small bowel obstruction versus ileus. 2. Sigmoid diverticulosis. 3. Total right hip replacement. 4. Chronic compression fracture deformity at the level of T11. 5. Aortic atherosclerosis. Aortic Atherosclerosis (ICD10-I70.0). Electronically Signed   By: Virgina Norfolk M.D.   On: 02/21/2022 17:12    Orson Eva, DO  [facility]  If 7PM-7AM, please contact night-coverage [email or web address] Password [email or web address] 03/10/2022, 1:25 PM   LOS: 2 days

## 2022-03-11 ENCOUNTER — Inpatient Hospital Stay (HOSPITAL_COMMUNITY): Payer: MEDICARE

## 2022-03-11 DIAGNOSIS — J9601 Acute respiratory failure with hypoxia: Secondary | ICD-10-CM | POA: Diagnosis not present

## 2022-03-11 DIAGNOSIS — J690 Pneumonitis due to inhalation of food and vomit: Secondary | ICD-10-CM | POA: Diagnosis not present

## 2022-03-11 DIAGNOSIS — J90 Pleural effusion, not elsewhere classified: Secondary | ICD-10-CM | POA: Diagnosis not present

## 2022-03-11 DIAGNOSIS — A419 Sepsis, unspecified organism: Secondary | ICD-10-CM | POA: Diagnosis not present

## 2022-03-11 LAB — CBC
HCT: 26.9 % — ABNORMAL LOW (ref 36.0–46.0)
Hemoglobin: 8.2 g/dL — ABNORMAL LOW (ref 12.0–15.0)
MCH: 30.6 pg (ref 26.0–34.0)
MCHC: 30.5 g/dL (ref 30.0–36.0)
MCV: 100.4 fL — ABNORMAL HIGH (ref 80.0–100.0)
Platelets: 209 10*3/uL (ref 150–400)
RBC: 2.68 MIL/uL — ABNORMAL LOW (ref 3.87–5.11)
RDW: 15.2 % (ref 11.5–15.5)
WBC: 14.6 10*3/uL — ABNORMAL HIGH (ref 4.0–10.5)
nRBC: 0 % (ref 0.0–0.2)

## 2022-03-11 LAB — GLUCOSE, CAPILLARY
Glucose-Capillary: 148 mg/dL — ABNORMAL HIGH (ref 70–99)
Glucose-Capillary: 140 mg/dL — ABNORMAL HIGH (ref 70–99)
Glucose-Capillary: 157 mg/dL — ABNORMAL HIGH (ref 70–99)
Glucose-Capillary: 147 mg/dL — ABNORMAL HIGH (ref 70–99)
Glucose-Capillary: 117 mg/dL — ABNORMAL HIGH (ref 70–99)

## 2022-03-11 LAB — BASIC METABOLIC PANEL
Anion gap: 6 (ref 5–15)
BUN: 19 mg/dL (ref 8–23)
CO2: 23 mmol/L (ref 22–32)
Calcium: 8.3 mg/dL — ABNORMAL LOW (ref 8.9–10.3)
Chloride: 119 mmol/L — ABNORMAL HIGH (ref 98–111)
Creatinine, Ser: 0.82 mg/dL (ref 0.44–1.00)
GFR, Estimated: >60 mL/min (ref >60)
Glucose, Bld: 192 mg/dL — ABNORMAL HIGH (ref 70–99)
Potassium: 4.2 mmol/L (ref 3.5–5.1)
Sodium: 148 mmol/L — ABNORMAL HIGH (ref 135–145)

## 2022-03-11 LAB — MAGNESIUM: Magnesium: 1.9 mg/dL (ref 1.7–2.4)

## 2022-03-11 LAB — PHOSPHORUS: Phosphorus: 1.7 mg/dL — ABNORMAL LOW (ref 2.5–4.6)

## 2022-03-11 MED ORDER — FUROSEMIDE 10 MG/ML IJ SOLN
40.0000 mg | Freq: Once | INTRAMUSCULAR | Status: AC
  Administered 2022-03-11: 40 mg via INTRAVENOUS
  Filled 2022-03-11: qty 4

## 2022-03-11 NOTE — Progress Notes (Signed)
Attempted to place patient back on nasal cannula at 6lpm oxygen and give her a break off bipap. Patient immediately dropped saturations into the mid 70% and I had to place back on bipap.

## 2022-03-11 NOTE — Progress Notes (Signed)
PROGRESS NOTE  Robin Mcclure RXV:[id number] DOB: 18-Mar-1923 DOA: 03/08/2022 PCP: Lujean Amel, MD  Brief History:  88 year old female with history of diabetes mellitus type 2, COPD, hypertension, hyperlipidemia, GERD, and dysphagia presenting from [facility] secondary to altered level of consciousness and hypoxia.  The patient is unable to provide any significant history secondary to her extremis and altered mental status.  History is obtained from review of the medical record and speaking with the patient's daughter.  Notably, the patient was noted to have altered level consciousness when she was getting a nebulizer treatment at [facility] on 03/08/2022.  It was thought that the patient may have had a syncopal type episode when she was receiving a nebulizer treatment.  The patient had regained consciousness by the time EMS arrived, but she was noted to have oxygen saturation of 70%.  Chest x-ray earlier in the day showed right lower lobe opacity. Notably, the patient was recently hospitalized from 02/21/2022 to 03/05/2022 for small bowel obstruction versus ileus.  Her hospitalization was also complicated by aspiration pneumonia. In the ED, the patient had temperature up to 101.3 F.  She was hemodynamically stable.  She was placed on BiPAP with saturation 95-97%.  WBC 32.8, hemoglobin 9.0, platelets 261,000.  Sodium 133, potassium 3.4, bicarbonate 25, serum creatinine 1.03.  LFTs were unremarkable.  Troponin 120>> 114.  CTA chest was negative for PE but showed bilateral lower lobe consolidations with air bronchograms.  There are small bilateral pleural effusions.  There is a stable chronic T11 fracture.  The patient was initially started on cefepime.  This was changed to Zosyn.  The patient was placed on BiPAP and admitted to the stepdown unit.   Assessment/Plan:  Sepsis -Present on admission -Presented with fever, leukocytosis, respiratory failure -Secondary to pneumonia -Continue IV  fluids -Continue IV Zosyn -blood cultures remain neg -PCT 1.44>>1.33   Aspiration pneumonia -MRSA screen--neg -Continue Zosyn -CTA chest negative for PE, shows bilateral lower lobe consolidation with air bronchograms   Acute respiratory failure with hypoxia -Secondary to pneumonia -Currently on BiPAP>>weaned to 5L HFNC -10/22>>more hypoxic>>placed back on BiPAP -10/22 personally reviewed CXR>>increase bibasilar opacities?effusions -10/22>>lasix 40 mg IV x 1 -Wean as tolerated   Elevated troponin -troponin 120>>114 -due to demand ischemia in setting of sepsis   Diabetes mellitus type 2 -NovoLog sliding scale -02/22/2022 hemoglobin A1c 6.4 -Holding Januvia and metformin   Essential hypertension -Holding nifedipine secondary to soft blood pressure   COPD -Start DuoNebs   Hypernatremia -Started hypotonic fluid -Hold furosemide -change to D5W>>saline lock 10/22 due to fluid overload   Hyperlipidemia -Restart statin once able to tolerate p.o.   GOC -discussed with daughter>>confirms DNR -otherwise, continue full scope of care -daughter hopeful patient can stablize       Family Communication:   daughter updated 10/22   Consultants: none    Code Status:  DNR--confirmed with daughter 10/20   DVT Prophylaxis:  [place] Lovenox     Procedures: As Listed in Progress Note Above   Antibiotics: Zosyn 10/20>>        Subjective: ROS limited as pt is on bipap.  Denies cp,abd pain.  Complains of sob.  Remainder ROS unobtainable.  Objective: Vitals:   03/11/22 1600 03/11/22 1635 03/11/22 1655 03/11/22 1700  BP: (!) 141/50   (!) 134/55  Pulse: (!) 57 61  69  Resp: (!) 22 (!) 21  17  Temp:  98.1 F (36.7 C)  TempSrc:  Axillary    SpO2: 100% 100% 99% 100%  Weight:      Height:        Intake/Output Summary (Last 24 hours) at 03/11/2022 1722 Last data filed at 03/11/2022 1700 Gross per 24 hour  Intake 1995.39 ml  Output 715 ml  Net 1280.39 ml   Weight  change:  Exam:  General:  Pt is alert, follows commands appropriately, not in acute distress HEENT: No icterus, No thrush, No neck mass, [place]/AT Cardiovascular: RRR, S1/S2, no rubs, no gallops Respiratory: CTA bilaterally, no wheezing, no crackles, no rhonchi Abdomen: Soft/+BS, non tender, non distended, no guarding Extremities: No edema, No lymphangitis, No petechiae, No rashes, no synovitis   Data Reviewed: I have personally reviewed following labs and imaging studies Basic Metabolic Panel: Recent Labs  Lab 03/05/22 0354 03/08/22 1731 03/09/22 0553 03/10/22 0431 03/11/22 0347  NA 148* 149* 153* 154* 148*  K 3.5 3.9 3.4* 3.8 4.2  CL 116* 113* 117* 122* 119*  CO2 '26 25 25 23 23  '$ GLUCOSE 164* 250* 230* 175* 192*  BUN 15 23 29* 25* 19  CREATININE 0.65 1.01* 1.03* 0.85 0.82  CALCIUM 9.5 9.0 8.9 8.6* 8.3*  MG  --   --   --  2.1 1.9  PHOS  --   --   --   --  1.7*   Liver Function Tests: Recent Labs  Lab 03/08/22 1731 03/09/22 0553  AST 14* 10*  ALT 14 11  ALKPHOS 76 66  BILITOT 0.7 0.5  PROT 7.0 5.9*  ALBUMIN 2.5* 2.1*   Recent Labs  Lab 03/08/22 1731  LIPASE 23   No results for input(s): "AMMONIA" in the last 168 hours. Coagulation Profile: No results for input(s): "INR", "PROTIME" in the last 168 hours. CBC: Recent Labs  Lab 03/05/22 0354 03/08/22 1731 03/09/22 0553 03/10/22 0431 03/11/22 0347  WBC 9.9 32.8* 28.5* 22.5* 14.6*  NEUTROABS  --  29.8*  --   --   --   HGB 9.5* 11.0* 10.0* 8.7* 8.2*  HCT 29.8* 35.3* 33.2* 28.2* 26.9*  MCV 98.0 99.2 101.8* 100.0 100.4*  PLT 232 261 241 223 209   Cardiac Enzymes: No results for input(s): "CKTOTAL", "CKMB", "CKMBINDEX", "TROPONINI" in the last 168 hours. BNP: Invalid input(s): "POCBNP" CBG: Recent Labs  Lab 03/10/22 2351 03/11/22 0400 03/11/22 0733 03/11/22 1130 03/11/22 1637  GLUCAP 127* 148* 140* 157* 147*   HbA1C: No results for input(s): "HGBA1C" in the last 72 hours. Urine analysis:     Component Value Date/Time   COLORURINE YELLOW 02/21/2022 1517   APPEARANCEUR HAZY (A) 02/21/2022 1517   LABSPEC 1.015 02/21/2022 1517   PHURINE 5.0 02/21/2022 1517   GLUCOSEU NEGATIVE 02/21/2022 1517   HGBUR NEGATIVE 02/21/2022 1517   BILIRUBINUR NEGATIVE 02/21/2022 1517   KETONESUR 20 (A) 02/21/2022 1517   PROTEINUR NEGATIVE 02/21/2022 1517   NITRITE NEGATIVE 02/21/2022 1517   LEUKOCYTESUR NEGATIVE 02/21/2022 1517   Sepsis Labs: '@LABRCNTIP'$ (procalcitonin:4,lacticidven:4) ) Recent Results (from the past 240 hour(s))  SARS Coronavirus 2 by RT PCR (hospital order, performed in [facility] hospital lab) *cepheid single result test* Anterior Nasal Swab     Status: None   Collection Time: 03/08/22  5:31 PM   Specimen: Anterior Nasal Swab  Result Value Ref Range Status   SARS Coronavirus 2 by RT PCR NEGATIVE NEGATIVE Final    Comment: (NOTE) SARS-CoV-2 target nucleic acids are NOT DETECTED.  The SARS-CoV-2 RNA is generally detectable in upper and lower  respiratory specimens during the acute phase of infection. The lowest concentration of SARS-CoV-2 viral copies this assay can detect is 250 copies / mL. A negative result does not preclude SARS-CoV-2 infection and should not be used as the sole basis for treatment or other patient management decisions.  A negative result may occur with improper specimen collection / handling, submission of specimen other than nasopharyngeal swab, presence of viral mutation(s) within the areas targeted by this assay, and inadequate number of viral copies (<250 copies / mL). A negative result must be combined with clinical observations, patient history, and epidemiological information.  Fact Sheet for Patients:   https://www.patel.info/  Fact Sheet for Healthcare Providers: https://hall.com/  This test is not yet approved or  cleared by the [place] FDA and has been authorized for detection and/or  diagnosis of SARS-CoV-2 by FDA under an Emergency Use Authorization (EUA).  This EUA will remain in effect (meaning this test can be used) for the duration of the COVID-19 declaration under Section 564(b)(1) of the Act, 21 U.S.C. section 360bbb-3(b)(1), unless the authorization is terminated or revoked sooner.  Performed at [facility], [address], [place], [place] [address]   Blood culture (routine x 2)     Status: None (Preliminary result)   Collection Time: 03/08/22  5:31 PM   Specimen: BLOOD  Result Value Ref Range Status   Specimen Description BLOOD BLOOD LEFT ARM  Final   Special Requests BLOOD  Final   Culture   Final    NO GROWTH 3 DAYS Performed at [facility], [address] [place], [place] [address]    Report Status PENDING  Incomplete  Blood culture (routine x 2)     Status: None (Preliminary result)   Collection Time: 03/08/22  5:40 PM   Specimen: BLOOD  Result Value Ref Range Status   Specimen Description BLOOD LEFT ANTECUBITAL  Final   Special Requests   Final    BOTTLES DRAWN AEROBIC AND ANAEROBIC Blood Culture adequate volume   Culture   Final    NO GROWTH 3 DAYS Performed at [facility], [address], [place], [place] [address]    Report Status PENDING  Incomplete  MRSA Next Gen by PCR, Nasal     Status: None   Collection Time: 03/09/22  8:20 AM   Specimen: Nasal Mucosa; Nasal Swab  Result Value Ref Range Status   MRSA by PCR Next Gen NOT DETECTED NOT DETECTED Final    Comment: (NOTE) The GeneXpert MRSA Assay (FDA approved for NASAL specimens only), is one component of a comprehensive MRSA colonization surveillance program. It is not intended to diagnose MRSA infection nor to guide or monitor treatment for MRSA infections. Test performance is not FDA approved in patients less than 8 years old. Performed at [facility], [address] [place], [place] [address]      Scheduled Meds:  Chlorhexidine Gluconate Cloth  6 each Topical Daily    enoxaparin (LOVENOX) injection  40 mg Subcutaneous Q24H   insulin aspart  0-9 Units Subcutaneous Q4H   mouth rinse  15 mL Mouth Rinse 4 times per day   pantoprazole (PROTONIX) IV  40 mg Intravenous Q24H   Continuous Infusions:  piperacillin-tazobactam (ZOSYN)  IV 3.375 g (03/11/22 1537)    Procedures/Studies: DG CHEST PORT 1 VIEW  Result Date: 03/11/2022 CLINICAL DATA:  Respiratory distress. EXAM: PORTABLE CHEST 1 VIEW COMPARISON:  March 08, 2022. FINDINGS: Stable cardiomegaly. Mildly increased bibasilar atelectasis or edema is noted with associated  pleural effusions. Bony thorax is unremarkable. IMPRESSION: Mildly increased bibasilar atelectasis or edema is noted with associated pleural effusion. Electronically Signed   By: Marijo Conception M.D.   On: 03/11/2022 14:36   CT Angio Chest PE W and/or Wo Contrast  Result Date: 03/08/2022 CLINICAL DATA:  Syncopal episode and low oxygen saturation. EXAM: CT ANGIOGRAPHY CHEST WITH CONTRAST TECHNIQUE: Multidetector CT imaging of the chest was performed using the standard protocol during bolus administration of intravenous contrast. Multiplanar CT image reconstructions and MIPs were obtained to evaluate the vascular anatomy. RADIATION DOSE REDUCTION: This exam was performed according to the departmental dose-optimization program which includes automated exposure control, adjustment of the mA and/or kV according to patient size and/or use of iterative reconstruction technique. CONTRAST:  13m OMNIPAQUE IOHEXOL 350 MG/ML SOLN COMPARISON:  02/23/2022 FINDINGS: Cardiovascular: The heart is mildly enlarged but stable. Moderate tortuosity of the thoracic aorta but no focal aneurysm or dissection. Stable aortic and coronary artery calcifications. Stable marked enlargement of the pulmonary arteries suggesting pulmonary hypertension. No filling defects to suggest pulmonary embolism. Mediastinum/Nodes: No mediastinal or hilar mass or adenopathy. The esophagus is  grossly. Lungs/Pleura: Small bilateral pleural effusions. Dense bilateral lower lobe airspace consolidation and air bronchograms likely pulmonary infiltrates. Aspiration would certainly be a consideration. There are also patchy infiltrates in both upper lobes. No pneumothorax. Upper Abdomen: No significant upper abdominal findings. Musculoskeletal: No breast masses, supraclavicular or axillary adenopathy. The bony thorax is stable. Stable severe thoracic kyphosis with midthoracic compression fractures and stable vertebral plana deformity at T11. Review of the MIP images confirms the above findings. IMPRESSION: 1. No CT findings for pulmonary embolism. 2. Stable tortuosity and calcification of the thoracic aorta but no focal aneurysm or dissection. 3. Stable marked enlargement of the pulmonary arteries suggesting pulmonary hypertension. 4. Dense bilateral lower lobe airspace consolidation and air bronchograms likely pulmonary infiltrates. Aspiration would certainly be a consideration. 5. Small bilateral pleural effusions. 6. Stable age advanced atherosclerotic calcifications involving the aorta and coronary arteries. 7. Stable thoracic compression fractures and vertebral plana deformity at T11. 8. Aortic atherosclerosis. Aortic Atherosclerosis (ICD10-I70.0). Electronically Signed   By: PMarijo SanesM.D.   On: 03/08/2022 21:35   DG Chest Port 1 View  Result Date: 03/08/2022 CLINICAL DATA:  Shortness of breath. EXAM: PORTABLE CHEST 1 VIEW COMPARISON:  03/01/2022 FINDINGS: The heart is borderline enlarged but stable. The mediastinal and hilar contours are within normal limits and unchanged. Tortuosity and calcification of the thoracic aorta. Chronic underlying lung changes with areas of pulmonary scarring. Small bilateral pleural effusions noted with bibasilar atelectasis. No obvious infiltrates. IMPRESSION: Chronic underlying lung disease with small bilateral pleural effusions and bibasilar atelectasis.  Electronically Signed   By: PMarijo SanesM.D.   On: 03/08/2022 18:02   [place]CHEST (PLEURAL EFFUSION)  Result Date: 03/01/2022 CLINICAL DATA:  LEFT pleural effusion EXAM: CHEST ULTRASOUND COMPARISON:  Chest radiograph 03/01/2022 FINDINGS: Small LEFT pleural effusion is identified. Based on patient age/size and condition, this is insufficient for thoracentesis. Atelectasis in LEFT lower lobe. IMPRESSION: Small LEFT pleural effusion insufficient for thoracentesis. Electronically Signed   By: MLavonia DanaM.D.   On: 03/01/2022 12:12   DG CHEST PORT 1 VIEW  Result Date: 03/01/2022 CLINICAL DATA:  Shortness of breath. EXAM: PORTABLE CHEST 1 VIEW COMPARISON:  02/27/2022 FINDINGS: Stable cardiomediastinal contours. Bilateral pleural effusions are noted, left greater than right. Persistent complete retrocardiac opacification of the left lung base. Again noted are patchy bilateral upper lobe and right  lung base opacities. These are unchanged compared with the previous exam. IMPRESSION: 1. No change in aeration to the lungs compared with previous exam. 2. Persistent bilateral pleural effusions, left greater than right. Electronically Signed   By: Kerby Moors M.D.   On: 03/01/2022 08:22   DG ABD ACUTE 2+V W 1V CHEST  Result Date: 02/28/2022 CLINICAL DATA:  Shortness of breath. EXAM: DG ABDOMEN ACUTE WITH 1 VIEW CHEST COMPARISON:  Radiographs 02/27/2022 FINDINGS: Progressive pulmonary infiltrates and probable small bilateral pleural effusions. Contrast noted throughout the colon from a recent swallowing function test. No findings for small bowel obstruction or free air. IMPRESSION: 1. Progressive pulmonary infiltrates and probable small bilateral pleural effusions. 2. No acute abdominal findings. Electronically Signed   By: Marijo Sanes M.D.   On: 02/28/2022 14:36   DG SWALLOW FUNC SPEECH PATH  Result Date: 02/27/2022 Table formatting from the original result was not included. Objective Swallowing  Evaluation: Type of Study: MBS-Modified Barium Swallow Study  Patient Details Name: Gloris Shiroma MRN: [id number] Date of Birth: [birth year] Today's Date: 02/27/2022 Time: SLP Start Time (ACUTE ONLY): 1400 -SLP Stop Time (ACUTE ONLY): 1478 SLP Time Calculation (min) (ACUTE ONLY): 33 min Past Medical History: Past Medical History: Diagnosis Date  Diabetes mellitus without complication ([place])   Hyperlipidemia   Hypertension  Past Surgical History: Past Surgical History: Procedure Laterality Date  ABDOMINAL HYSTERECTOMY    BIOPSY  01/19/2018  Procedure: BIOPSY;  Surgeon: Ronnette Juniper, MD;  Location: [facility] ENDOSCOPY;  Service: Gastroenterology;;  CHOLECYSTECTOMY    ESOPHAGOGASTRODUODENOSCOPY (EGD) WITH PROPOFOL N/A 01/19/2018  Procedure: ESOPHAGOGASTRODUODENOSCOPY (EGD) WITH PROPOFOL;  Surgeon: Ronnette Juniper, MD;  Location: [place];  Service: Gastroenterology;  Laterality: N/A;  HIP SURGERY   HPI: 88 y.o. female with medical history significant of COPD, GERD, hypertension, hyperlipidemia, T2DM admitted on 02/21/2022 with intractable emesis and found to have partial small bowel obstruction  Versus ileus. in the early hours of 02/24/2022 patient developed significant respiratory distress with hypoxia O2 sats down to the 60s  -Required deep suctioning, bronchodilators, high flow oxygen  Arterial Blood Gas result:  pO2 44; pCO2 48; pH 7.3;  HCO3 23.6, %O2 Sat 76.8 on 8 L of [place]  -Currently requiring up to 11 L of oxygen via nasal cannula. BSE requested.  Subjective: "I am getting so tired."  Recommendations for follow up therapy are one component of a multi-disciplinary discharge planning process, led by the attending physician.  Recommendations may be updated based on patient status, additional functional criteria and insurance authorization. Assessment / Plan / Recommendation   02/27/2022   4:00 PM Clinical Impressions Clinical Impression Pt presents with mi/mod oropharyngeal and suspected primary esophageal dysphagia characterized by  weak lingual manipulation and prolonged oral transit, swallow trigger at the level of the valleculae, decreased tongue base retraction and epiglottic deflection which appeared to be negatively impacted by elongated uvulae that nearly meat the tip of the epiglottis and impaired epiglottic deflection and approximation with posterior pharyngeal wall resulting in reduced laryngeal vestibule closure and penetration of thins during the swallow and penetration/aspiration to the vocal folds (difficult to see if below vocal folds at times) in trace amounts and resulting vallecular residue and occasional contrast behind the uvulae. Epiglottis deflected greater with puree. Nectars only spilled over into the laryngeal vestibule in trace amounts, but also resulted in vallecular residue. Esophageal sweep revealed extensive barium contrast diffusely throughout the esophagus that resulted in some backflow/retrograde movement to the cervical esophagus. Pt still currently requires high supplemental  oxygen and fatigues quickly, which increases risk for aspiration and decreases her ability to meet nutritional needs. Pt's daughter indicates that Pt was on nectar thickened liquids about 1-2 years ago and "did not like it". SLP reviewed imaging with Pt's daughter and suggested that we change her to NTL for now, with plan to advance her once she is clinically stronger. Daughter is willing to try this. Question whether Pt's appearance of elongated uvula could be due to some swelling from recent NG placement? Recommend D1/puree and NTL via cup/straw with pacing strategies due to shortness of breath and fatigue and ensure that Pt is sitting upright for all eating/drinking and remains up for at least 30 minutes after meals due to esophageal dsymotility. SLP will follow during acute stay. SLP Visit Diagnosis Dysphagia, pharyngoesophageal phase (R13.14);Dysphagia, oropharyngeal phase (R13.12) Impact on safety and function Mild aspiration  risk;Risk for inadequate nutrition/hydration     02/27/2022   4:00 PM Treatment Recommendations Treatment Recommendations Therapy as outlined in treatment plan below     02/27/2022   4:00 PM Prognosis Prognosis for Safe Diet Advancement Good   02/27/2022   4:00 PM Diet Recommendations SLP Diet Recommendations Dysphagia 1 (Puree) solids;Nectar thick liquid Liquid Administration via Cup;Straw Medication Administration Whole meds with puree Compensations Slow rate;Small sips/bites;Multiple dry swallows after each bite/sip Postural Changes Remain semi-upright after after feeds/meals (Comment);Seated upright at 90 degrees     02/27/2022   4:00 PM Other Recommendations Recommended Consults Consider esophageal assessment Oral Care Recommendations Oral care BID;Staff/trained caregiver to provide oral care Other Recommendations Clarify dietary restrictions;Order thickener from pharmacy;Prohibited food (jello, ice cream, thin soups) Follow Up Recommendations Skilled nursing-short term rehab (<3 hours/day) Assistance recommended at discharge Frequent or constant Supervision/Assistance Functional Status Assessment Patient has had a recent decline in their functional status and demonstrates the ability to make significant improvements in function in a reasonable and predictable amount of time.   02/27/2022   4:00 PM Frequency and Duration  Speech Therapy Frequency (ACUTE ONLY) min 2x/week Treatment Duration 1 week     02/27/2022   4:00 PM Oral Phase Oral Phase Impaired Oral - Mech Soft Weak lingual manipulation;Delayed oral transit;Piecemeal swallowing    02/27/2022   4:00 PM Pharyngeal Phase Pharyngeal Phase Impaired Pharyngeal- Nectar Straw Delayed swallow initiation-vallecula;Reduced epiglottic inversion;Penetration/Aspiration during swallow;Pharyngeal residue - valleculae Pharyngeal Material enters airway, remains ABOVE vocal cords then ejected out Pharyngeal- Thin Teaspoon Delayed swallow initiation-vallecula;Reduced  epiglottic inversion;Reduced tongue base retraction;Penetration/Aspiration during swallow;Penetration/Apiration after swallow;Pharyngeal residue - valleculae Pharyngeal Material enters airway, CONTACTS cords and not ejected out;Material enters airway, remains ABOVE vocal cords and not ejected out Pharyngeal- Thin Cup Delayed swallow initiation-vallecula;Reduced epiglottic inversion;Reduced airway/laryngeal closure;Reduced tongue base retraction;Penetration/Aspiration during swallow;Penetration/Apiration after swallow;Trace aspiration;Pharyngeal residue - valleculae Pharyngeal Material enters airway, CONTACTS cords and not ejected out Pharyngeal- Thin Straw Delayed swallow initiation-vallecula;Delayed swallow initiation-pyriform sinuses;Reduced tongue base retraction;Reduced epiglottic inversion;Penetration/Aspiration during swallow;Pharyngeal residue - valleculae Pharyngeal Material enters airway, remains ABOVE vocal cords and not ejected out Pharyngeal- Puree Delayed swallow initiation-vallecula;Reduced tongue base retraction;Reduced epiglottic inversion;Pharyngeal residue - valleculae Pharyngeal- Mechanical Soft Reduced epiglottic inversion;Reduced tongue base retraction;Delayed swallow initiation-vallecula;Pharyngeal residue - valleculae Pharyngeal- Pill WFL;Delayed swallow initiation-vallecula    02/27/2022   4:00 PM Cervical Esophageal Phase  Cervical Esophageal Phase Impaired Puree Esophageal backflow into cervical esophagus Thank you, Genene Churn, CCC-SLP [phone] PORTER,DABNEY 02/27/2022, 5:27 PM                     DG CHEST PORT 1 VIEW  Result Date: 02/27/2022 CLINICAL DATA:  Aspiration into airway. EXAM: PORTABLE CHEST 1 VIEW COMPARISON:  02/26/2022 and 02/24/2022 FINDINGS: Increased focal density along the periphery of the right upper lung. Slightly increased airspace densities in the medial aspect of the right upper lobe. Again noted are densities at the left lung base and the left  hemidiaphragm is obscured. Findings could represent a combination of pleural fluid and consolidation at the left lung base. Heart size is stable. Trachea is midline. Negative for a pneumothorax. Nasogastric tube has been removed. Surgical clips in the right upper abdomen. IMPRESSION: 1. Increased airspace densities in the right upper lobe. Findings are concerning for pneumonia. 2. Left basilar densities could represent a combination of pleural fluid and airspace disease/consolidation. Electronically Signed   By: Markus Daft M.D.   On: 02/27/2022 08:58   DG ABD ACUTE 2+V W 1V CHEST  Result Date: 02/26/2022 CLINICAL DATA:  Aspiration.  History of small-bowel obstruction. EXAM: DG ABDOMEN ACUTE WITH 1 VIEW CHEST COMPARISON:  Abdominal radiograph 02/25/2022 FINDINGS: Enteric tube appears coiled within the stomach. Stable cardiomegaly and hilar prominence. Similar-appearing left-greater-than-right basilar airspace opacities which may represent atelectasis. Possible trace bilateral pleural effusions. No pneumothorax. Thoracic spine degenerative changes. Oral contrast material within the descending colon. There are a few gaseous distended loops of small bowel within the central abdomen. Surgical clips right upper quadrant. Right hip arthroplasty. Lumbar spine degenerative changes. IMPRESSION: 1. A few gaseous distended loops of small bowel within the central abdomen, similar to prior. 2. Bibasilar atelectasis. 3. Oral contrast material within the descending colon. 4. Enteric tube appears coiled within the stomach. Electronically Signed   By: Lovey Newcomer M.D.   On: 02/26/2022 08:08   DG ABD ACUTE 2+V W 1V CHEST  Result Date: 02/25/2022 CLINICAL DATA:  Respiratory failure with hypoxia. Bowel obstruction. EXAM: DG ABDOMEN ACUTE WITH 1 VIEW CHEST COMPARISON:  02/24/2022 FINDINGS: Orogastric tube is seen with tip in the distal antrum or duodenal bulb. Oral contrast material is now seen within nondilated left colon.  Decreased small bowel dilatation is seen since prior exam. Contrast noted in the urinary bladder from recent CT. Cardiomegaly is stable. Enlarged pulmonary arteries are consistent with pulmonary arterial hypertension. Increased opacity in both lung bases may be due to atelectasis or infiltrate. No evidence of pleural effusion or pneumothorax. IMPRESSION: Decreased small bowel dilatation since prior exam. Oral contrast material now seen in nondilated left colon. Increased bibasilar atelectasis versus infiltrate. Stable cardiomegaly and pulmonary arterial hypertension. Electronically Signed   By: Marlaine Hind M.D.   On: 02/25/2022 09:16   DG ABD ACUTE 2+V W 1V CHEST  Result Date: 02/24/2022 CLINICAL DATA:  88 year old female with resolving small bowel obstruction on CT yesterday, oral contrast reached the colon. EXAM: DG ABDOMEN ACUTE WITH 1 VIEW CHEST COMPARISON:  CT Chest, Abdomen, and Pelvis yesterday and earlier. FINDINGS: Portable upright AP view of the chest at 0719 hours. Enteric tube terminates in the distal stomach or duodenum, side hole at the level of the gastric body. Stable cholecystectomy clips. No pneumoperitoneum or pneumothorax. Stable lung bases and mediastinum. Portable upright and supine views the abdomen at 0719 hours. Retained oral contrast appears to be largely within the colon now from the transverse to the sigmoid. Excreted IV contrast in the urinary bladder. Superimposed gas-filled small bowel loops up to 3.5 cm diameter are not significantly changed from the CT yesterday. Right hip arthroplasty.  No acute osseous abnormality identified. IMPRESSION: 1. Stable bowel gas pattern from  the CT yesterday. Retained oral contrast now primarily in the colon. No free air. 2. Stable chest. Enteric tube terminates in the distal stomach or duodenum. Electronically Signed   By: Genevie Ann M.D.   On: 02/24/2022 09:21   CT CHEST W CONTRAST  Result Date: 02/23/2022 CLINICAL DATA:  Intractable emesis,  partial small bowel obstruction versus ileus, abnormal density right upper chest EXAM: CT CHEST, ABDOMEN, AND PELVIS WITH CONTRAST TECHNIQUE: Multidetector CT imaging of the chest, abdomen and pelvis was performed following the standard protocol during bolus administration of intravenous contrast. RADIATION DOSE REDUCTION: This exam was performed according to the departmental dose-optimization program which includes automated exposure control, adjustment of the mA and/or kV according to patient size and/or use of iterative reconstruction technique. CONTRAST:  40m OMNIPAQUE IOHEXOL 300 MG/ML  SOLN COMPARISON:  02/23/2022, 02/21/2022 FINDINGS: CT CHEST FINDINGS Cardiovascular: Heart is enlarged without pericardial effusion. Extensive atherosclerosis of the coronary vasculature unchanged. No evidence of thoracic aortic aneurysm or dissection. Stable aortic atherosclerosis. Dilated main pulmonary trunk consistent with pulmonary arterial hypertension. Mediastinum/Nodes: Enteric catheter extends into the gastric lumen, tip in the region of the duodenal bulb. Thyroid and trachea are unremarkable. No pathologic adenopathy. Lungs/Pleura: No airspace disease, effusion, or pneumothorax. Density in the right upper chest on preceding x-ray likely reflect a superimposed vascular shadow given patient rotation. Central airways are patent. Musculoskeletal: There are no acute displaced fractures. Chronic compression deformities are seen at T4, T5, and T11. Reconstructed images demonstrate no additional findings. CT ABDOMEN PELVIS FINDINGS Hepatobiliary: No focal liver abnormality is seen. Status post cholecystectomy. No biliary dilatation. Pancreas: Pancreas is atrophic. No focal abnormalities or pancreatic duct dilation. Spleen: Normal in size without focal abnormality. Adrenals/Urinary Tract: Kidneys are unremarkable without urinary tract calculi or obstructive uropathy. The adrenals and bladder are grossly unremarkable.  Evaluation of the bladder slightly limited due to streak artifact from right hip arthroplasty. Stomach/Bowel: There has been transit of oral contrast into the colon by the time of imaging, excluding high-grade obstruction. There are some residual dilated loops of small bowel within the left mid abdomen, which may be due to resolving obstruction or ileus. No bowel wall thickening or inflammatory change. Vascular/Lymphatic: Aortic atherosclerosis. No enlarged abdominal or pelvic lymph nodes. Reproductive: Status post hysterectomy. No adnexal masses. Other: No free fluid or free intraperitoneal gas. No abdominal wall hernia. Musculoskeletal: There are no acute or destructive bony lesions. Right hip arthroplasty is unremarkable. Reconstructed images demonstrate no additional findings. IMPRESSION: 1. Resolving small-bowel obstruction or ileus, with transit of oral contrast into the colon at the time of imaging. Minimal residual small bowel dilatation in the left mid abdomen. 2. No acute intrathoracic process. The density seen in the right upper chest on preceding x-ray is consistent with superimposed vascular shadows given patient rotation on that exam. 3. Cardiomegaly. 4. Aortic Atherosclerosis (ICD10-I70.0). Coronary artery atherosclerosis. Electronically Signed   By: MRanda NgoM.D.   On: 02/23/2022 16:45   CT ABDOMEN PELVIS W CONTRAST  Result Date: 02/23/2022 CLINICAL DATA:  Intractable emesis, partial small bowel obstruction versus ileus, abnormal density right upper chest EXAM: CT CHEST, ABDOMEN, AND PELVIS WITH CONTRAST TECHNIQUE: Multidetector CT imaging of the chest, abdomen and pelvis was performed following the standard protocol during bolus administration of intravenous contrast. RADIATION DOSE REDUCTION: This exam was performed according to the departmental dose-optimization program which includes automated exposure control, adjustment of the mA and/or kV according to patient size and/or use of  iterative reconstruction technique. CONTRAST:  64m OMNIPAQUE IOHEXOL 300 MG/ML  SOLN COMPARISON:  02/23/2022, 02/21/2022 FINDINGS: CT CHEST FINDINGS Cardiovascular: Heart is enlarged without pericardial effusion. Extensive atherosclerosis of the coronary vasculature unchanged. No evidence of thoracic aortic aneurysm or dissection. Stable aortic atherosclerosis. Dilated main pulmonary trunk consistent with pulmonary arterial hypertension. Mediastinum/Nodes: Enteric catheter extends into the gastric lumen, tip in the region of the duodenal bulb. Thyroid and trachea are unremarkable. No pathologic adenopathy. Lungs/Pleura: No airspace disease, effusion, or pneumothorax. Density in the right upper chest on preceding x-ray likely reflect a superimposed vascular shadow given patient rotation. Central airways are patent. Musculoskeletal: There are no acute displaced fractures. Chronic compression deformities are seen at T4, T5, and T11. Reconstructed images demonstrate no additional findings. CT ABDOMEN PELVIS FINDINGS Hepatobiliary: No focal liver abnormality is seen. Status post cholecystectomy. No biliary dilatation. Pancreas: Pancreas is atrophic. No focal abnormalities or pancreatic duct dilation. Spleen: Normal in size without focal abnormality. Adrenals/Urinary Tract: Kidneys are unremarkable without urinary tract calculi or obstructive uropathy. The adrenals and bladder are grossly unremarkable. Evaluation of the bladder slightly limited due to streak artifact from right hip arthroplasty. Stomach/Bowel: There has been transit of oral contrast into the colon by the time of imaging, excluding high-grade obstruction. There are some residual dilated loops of small bowel within the left mid abdomen, which may be due to resolving obstruction or ileus. No bowel wall thickening or inflammatory change. Vascular/Lymphatic: Aortic atherosclerosis. No enlarged abdominal or pelvic lymph nodes. Reproductive: Status post  hysterectomy. No adnexal masses. Other: No free fluid or free intraperitoneal gas. No abdominal wall hernia. Musculoskeletal: There are no acute or destructive bony lesions. Right hip arthroplasty is unremarkable. Reconstructed images demonstrate no additional findings. IMPRESSION: 1. Resolving small-bowel obstruction or ileus, with transit of oral contrast into the colon at the time of imaging. Minimal residual small bowel dilatation in the left mid abdomen. 2. No acute intrathoracic process. The density seen in the right upper chest on preceding x-ray is consistent with superimposed vascular shadows given patient rotation on that exam. 3. Cardiomegaly. 4. Aortic Atherosclerosis (ICD10-I70.0). Coronary artery atherosclerosis. Electronically Signed   By: MRanda NgoM.D.   On: 02/23/2022 16:45   DG ABD ACUTE 2+V W 1V CHEST  Result Date: 02/23/2022 CLINICAL DATA:  Small-bowel obstruction. EXAM: DG ABDOMEN ACUTE WITH 1 VIEW CHEST COMPARISON:  February 21, 2022 FINDINGS: Enteric tube courses into the abdomen. Tip likely in the area of the proximal duodenum. Trachea midline. Cardiomediastinal contours and hilar structures are stable with signs of cardiomegaly. No consolidation. Subtle added density over the RIGHT upper chest. This is about the third and fourth ribs posteriorly and the clavicle, an area of significant overlap but is asymmetric to the other side. No pneumothorax. No free air beneath either the RIGHT or LEFT hemidiaphragm. Scattered loops of gas-filled bowel throughout the abdomen. Degree of distension slightly improved compared to previous imaging still with some mild to moderate distension of bowel loops in the LEFT mid abdomen. Post partial colonic resection as before. Stool and gas scattered throughout the colon with gas in the rectum. Degenerative changes throughout the spine. RIGHT hip arthroplasty. Osteopenia. IMPRESSION: 1. Enteric tube courses into the abdomen. Tip likely in the area of the  proximal duodenum. 2. Scattered loops of gas-filled bowel throughout the abdomen. Degree of distension slightly improved compared to previous imaging still with some mild to moderate distension of bowel loops in the LEFT mid abdomen. 3. No free air. 4. Added density over the RIGHT upper  chest, image slightly rotated and could project vascular structures over the upper chest. Density is however more than expected just above the RIGHT hilum and underlying nodule up to 2.3 cm is considered. Given nonstandard positioning currently would suggest CT of the chest for further evaluation on follow-up. Electronically Signed   By: Zetta Bills M.D.   On: 02/23/2022 08:12   US Venous Img Lower Unilateral Right (DVT)  Result Date: 02/22/2022 CLINICAL DATA:  Right leg swelling EXAM: RIGHT LOWER EXTREMITY VENOUS DOPPLER ULTRASOUND TECHNIQUE: Gray-scale sonography with compression, as well as color and duplex ultrasound, were performed to evaluate the deep venous system(s) from the level of the common femoral vein through the popliteal and proximal calf veins. COMPARISON:  None available FINDINGS: VENOUS Normal compressibility of the common femoral, superficial femoral, and popliteal veins, as well as the visualized calf veins. Visualized portions of profunda femoral vein and great saphenous vein unremarkable. No filling defects to suggest DVT on grayscale or color Doppler imaging. Doppler waveforms show normal direction of venous flow, normal respiratory plasticity and response to augmentation. Limited views of the contralateral common femoral vein are unremarkable. OTHER None. Limitations: none IMPRESSION: No right lower extremity DVT. Electronically Signed   By: Miachel Roux M.D.   On: 02/22/2022 13:22   DG Chest Portable 1 View  Result Date: 02/21/2022 CLINICAL DATA:  Enteric catheter placement EXAM: PORTABLE CHEST 1 VIEW COMPARISON:  08/18/2020 FINDINGS: Single frontal view of the chest demonstrates enteric catheter  passing below diaphragm, tip excluded by collimation. Side port projects over the gastric fundus. Cardiac silhouette is enlarged but stable. No airspace disease, effusion, or pneumothorax. No acute bony abnormality. IMPRESSION: 1. Enteric catheter coiled over the stomach, tip excluded by collimation. The side port projects over the gastric fundus. Electronically Signed   By: Randa Ngo M.D.   On: 02/21/2022 20:01   DG Abd Portable 1 View  Result Date: 02/21/2022 CLINICAL DATA:  Enteric catheter placement EXAM: PORTABLE ABDOMEN - 1 VIEW COMPARISON:  02/21/2022 FINDINGS: Frontal view of the lower chest and upper abdomen demonstrates enteric catheter passing below diaphragm, looped back upon itself with tip and side port extending retrograde in the region of the distal thoracic esophagus. Recommend removal and replacement. Lung bases are clear. Gaseous distention of the small bowel consistent with obstruction as seen on recent CT. IMPRESSION: 1. Enteric catheter as above, coiled back upon itself with tip projecting over the distal thoracic esophagus. Recommend removal and replacement. 2. Small bowel obstruction. Electronically Signed   By: Randa Ngo M.D.   On: 02/21/2022 18:34   CT Abdomen Pelvis W Contrast  Result Date: 02/21/2022 CLINICAL DATA:  Difficulty urinating. EXAM: CT ABDOMEN AND PELVIS WITH CONTRAST TECHNIQUE: Multidetector CT imaging of the abdomen and pelvis was performed using the standard protocol following bolus administration of intravenous contrast. RADIATION DOSE REDUCTION: This exam was performed according to the departmental dose-optimization program which includes automated exposure control, adjustment of the mA and/or kV according to patient size and/or use of iterative reconstruction technique. CONTRAST:  172m OMNIPAQUE IOHEXOL 300 MG/ML  SOLN COMPARISON:  None Available. FINDINGS: Lower chest: No acute abnormality. Hepatobiliary: No focal liver abnormality is seen. Status post  cholecystectomy. No biliary dilatation. Pancreas: Unremarkable. No pancreatic ductal dilatation or surrounding inflammatory changes. Spleen: Normal in size without focal abnormality. Adrenals/Urinary Tract: Adrenal glands are unremarkable. Kidneys are normal in size with mild areas of renal cortical thinning noted. There is no evidence of renal calculi or hydronephrosis. Bladder is  unremarkable. Stomach/Bowel: Stomach is within normal limits. The appendix is not identified. Multiple dilated small bowel loops are seen throughout the abdomen and pelvis (maximum small bowel diameter of approximately 3.8 cm). A gradual transition zone is seen within the lateral aspect of the mid to lower right abdomen. Numerous noninflamed diverticula are seen throughout the sigmoid colon. Vascular/Lymphatic: Aortic atherosclerosis. No enlarged abdominal or pelvic lymph nodes. Reproductive: Status post hysterectomy. No adnexal masses. Other: No abdominal wall hernia or abnormality. No abdominopelvic ascites. Musculoskeletal: A total right hip replacement is seen with a marked amount of associated streak artifact. Subsequently limited evaluation of the adjacent osseous and soft tissue structures is noted. A chronic compression fracture deformity is seen at the level of T11. Multilevel degenerative changes are seen throughout the remainder of the lumbar spine. IMPRESSION: 1. Findings consistent with a partial small bowel obstruction versus ileus. 2. Sigmoid diverticulosis. 3. Total right hip replacement. 4. Chronic compression fracture deformity at the level of T11. 5. Aortic atherosclerosis. Aortic Atherosclerosis (ICD10-I70.0). Electronically Signed   By: Virgina Norfolk M.D.   On: 02/21/2022 17:12    Orson Eva, DO  [facility]  If 7PM-7AM, please contact night-coverage [email or web address] Password [email or web address] 03/11/2022, 5:22 PM   LOS: 3 days

## 2022-03-12 DIAGNOSIS — A419 Sepsis, unspecified organism: Secondary | ICD-10-CM | POA: Diagnosis not present

## 2022-03-12 DIAGNOSIS — R0602 Shortness of breath: Secondary | ICD-10-CM | POA: Diagnosis not present

## 2022-03-12 DIAGNOSIS — Z66 Do not resuscitate: Secondary | ICD-10-CM | POA: Diagnosis not present

## 2022-03-12 DIAGNOSIS — Z7189 Other specified counseling: Secondary | ICD-10-CM | POA: Diagnosis not present

## 2022-03-12 DIAGNOSIS — J9601 Acute respiratory failure with hypoxia: Secondary | ICD-10-CM | POA: Diagnosis not present

## 2022-03-12 DIAGNOSIS — J690 Pneumonitis due to inhalation of food and vomit: Secondary | ICD-10-CM | POA: Diagnosis not present

## 2022-03-12 DIAGNOSIS — Z515 Encounter for palliative care: Secondary | ICD-10-CM | POA: Diagnosis not present

## 2022-03-12 DIAGNOSIS — J90 Pleural effusion, not elsewhere classified: Secondary | ICD-10-CM | POA: Diagnosis not present

## 2022-03-12 LAB — GLUCOSE, CAPILLARY
Glucose-Capillary: 110 mg/dL — ABNORMAL HIGH (ref 70–99)
Glucose-Capillary: 113 mg/dL — ABNORMAL HIGH (ref 70–99)
Glucose-Capillary: 123 mg/dL — ABNORMAL HIGH (ref 70–99)
Glucose-Capillary: 123 mg/dL — ABNORMAL HIGH (ref 70–99)
Glucose-Capillary: 128 mg/dL — ABNORMAL HIGH (ref 70–99)
Glucose-Capillary: 83 mg/dL (ref 70–99)
Glucose-Capillary: 88 mg/dL (ref 70–99)

## 2022-03-12 LAB — CBC
HCT: 28.4 % — ABNORMAL LOW (ref 36.0–46.0)
Hemoglobin: 8.4 g/dL — ABNORMAL LOW (ref 12.0–15.0)
MCH: 29.8 pg (ref 26.0–34.0)
MCHC: 29.6 g/dL — ABNORMAL LOW (ref 30.0–36.0)
MCV: 100.7 fL — ABNORMAL HIGH (ref 80.0–100.0)
Platelets: 226 10*3/uL (ref 150–400)
RBC: 2.82 MIL/uL — ABNORMAL LOW (ref 3.87–5.11)
RDW: 15 % (ref 11.5–15.5)
WBC: 10.7 10*3/uL — ABNORMAL HIGH (ref 4.0–10.5)
nRBC: 0 % (ref 0.0–0.2)

## 2022-03-12 LAB — BASIC METABOLIC PANEL
Anion gap: 8 (ref 5–15)
BUN: 17 mg/dL (ref 8–23)
CO2: 25 mmol/L (ref 22–32)
Calcium: 8.4 mg/dL — ABNORMAL LOW (ref 8.9–10.3)
Chloride: 112 mmol/L — ABNORMAL HIGH (ref 98–111)
Creatinine, Ser: 0.71 mg/dL (ref 0.44–1.00)
GFR, Estimated: >60 mL/min (ref >60)
Glucose, Bld: 132 mg/dL — ABNORMAL HIGH (ref 70–99)
Potassium: 3.5 mmol/L (ref 3.5–5.1)
Sodium: 145 mmol/L (ref 135–145)

## 2022-03-12 LAB — LEGIONELLA PNEUMOPHILA SEROGP 1 UR AG: L. pneumophila Serogp 1 Ur Ag: NEGATIVE

## 2022-03-12 MED ORDER — FUROSEMIDE 10 MG/ML IJ SOLN
40.0000 mg | Freq: Once | INTRAMUSCULAR | Status: AC
  Administered 2022-03-12: 40 mg via INTRAVENOUS
  Filled 2022-03-12: qty 4

## 2022-03-12 MED ORDER — FOOD THICKENER (SIMPLYTHICK): 1.0000 | ORAL | Status: DC | PRN

## 2022-03-12 NOTE — Progress Notes (Signed)
PROGRESS NOTE  Robin Mcclure SAY:[id number] DOB: Sep 29, 1922 DOA: 03/08/2022 PCP: Lujean Amel, MD  Brief History:  88 year old female with history of diabetes mellitus type 2, COPD, hypertension, hyperlipidemia, GERD, and dysphagia presenting from [facility] secondary to altered level of consciousness and hypoxia.  The patient is unable to provide any significant history secondary to her extremis and altered mental status.  History is obtained from review of the medical record and speaking with the patient's daughter.  Notably, the patient was noted to have altered level consciousness when she was getting a nebulizer treatment at [facility] on 03/08/2022.  It was thought that the patient may have had a syncopal type episode when she was receiving a nebulizer treatment.  The patient had regained consciousness by the time EMS arrived, but she was noted to have oxygen saturation of 70%.  Chest x-ray earlier in the day showed right lower lobe opacity. Notably, the patient was recently hospitalized from 02/21/2022 to 03/05/2022 for small bowel obstruction versus ileus.  Her hospitalization was also complicated by aspiration pneumonia. In the ED, the patient had temperature up to 101.3 F.  She was hemodynamically stable.  She was placed on BiPAP with saturation 95-97%.  WBC 32.8, hemoglobin 9.0, platelets 261,000.  Sodium 133, potassium 3.4, bicarbonate 25, serum creatinine 1.03.  LFTs were unremarkable.  Troponin 120>> 114.  CTA chest was negative for PE but showed bilateral lower lobe consolidations with air bronchograms.  There are small bilateral pleural effusions.  There is a stable chronic T11 fracture.  The patient was initially started on cefepime.  This was changed to Zosyn.  The patient was placed on BiPAP and admitted to the stepdown unit.   Assessment/Plan: Sepsis -Present on admission -Presented with fever, leukocytosis, respiratory failure -Secondary to pneumonia -Continue IV  fluids -Continue IV Zosyn -blood cultures remain neg -PCT 1.44>>1.33   Aspiration pneumonia -MRSA screen--neg -Continue Zosyn -CTA chest negative for PE, shows bilateral lower lobe consolidation with air bronchograms -speech therapy eval   Acute respiratory failure with hypoxia -Secondary to pneumonia -10/22>>more hypoxic>>placed back on BiPAP -10/22 personally reviewed CXR>>increase bibasilar opacities?effusions -10/22>>lasix 40 mg IV x 1 -10/23>>weaned back to 5L -10/23>>redose lasix IV -Wean as tolerated   Elevated troponin -troponin 120>>114 -due to demand ischemia in setting of sepsis   Diabetes mellitus type 2 -02/22/2022 hemoglobin A1c 6.4 -Holding Januvia and metformin -d/c ISS   Essential hypertension -Holding nifedipine secondary to soft blood pressure   COPD -Start DuoNebs   Hypernatremia -Started hypotonic fluid initially -Hold furosemide -change to D5W>>saline lock 10/22 due to fluid overload   Hyperlipidemia -Restart statin once able to tolerate p.o.   GOC -discussed with daughter>>confirms DNR -otherwise, continue full scope of care -daughter hopeful patient can stablize      Family Communication:  daughter updated 10/23  Consultants:  none  Code Status:  DNR  DVT Prophylaxis:  [place] Lovenox   Procedures: As Listed in Progress Note Above  Antibiotics: Zosyn 10/20>>      Subjective: Patient is awake and alert.   HOH.  Denies cp, sob, abd pain.  Wants to eat  Objective: Vitals:   03/12/22 0700 03/12/22 0810 03/12/22 1100 03/12/22 1142  BP: (!) 104/34 (!) 114/53 (!) 151/60   Pulse: (!) 51 (!) 38 76   Resp: 16 16 (!) 26   Temp:  97.8 F (36.6 C)  (!) 97.3 F (36.3 C)  TempSrc:  Axillary  Axillary  SpO2: 100% 100% 100%   Weight:      Height:        Intake/Output Summary (Last 24 hours) at 03/12/2022 1221 Last data filed at 03/12/2022 0600 Gross per 24 hour  Intake 150.04 ml  Output 1290 ml  Net -1139.96 ml   Weight  change: -4.2 kg Exam:  General:  Pt is alert, follows commands appropriately, not in acute distress HEENT: No icterus, No thrush, No neck mass, [place]/AT Cardiovascular: RRR, S1/S2, no rubs, no gallops Respiratory: bilateral rhonchi. No wheeze Abdomen: Soft/+BS, non tender, non distended, no guarding Extremities: 1 + LE edema, No lymphangitis, No petechiae, No rashes, no synovitis   Data Reviewed: I have personally reviewed following labs and imaging studies Basic Metabolic Panel: Recent Labs  Lab 03/08/22 1731 03/09/22 0553 03/10/22 0431 03/11/22 0347 03/12/22 0356  NA 149* 153* 154* 148* 145  K 3.9 3.4* 3.8 4.2 3.5  CL 113* 117* 122* 119* 112*  CO2 '25 25 23 23 25  '$ GLUCOSE 250* 230* 175* 192* 132*  BUN 23 29* 25* 19 17  CREATININE 1.01* 1.03* 0.85 0.82 0.71  CALCIUM 9.0 8.9 8.6* 8.3* 8.4*  MG  --   --  2.1 1.9  --   PHOS  --   --   --  1.7*  --    Liver Function Tests: Recent Labs  Lab 03/08/22 1731 03/09/22 0553  AST 14* 10*  ALT 14 11  ALKPHOS 76 66  BILITOT 0.7 0.5  PROT 7.0 5.9*  ALBUMIN 2.5* 2.1*   Recent Labs  Lab 03/08/22 1731  LIPASE 23   No results for input(s): "AMMONIA" in the last 168 hours. Coagulation Profile: No results for input(s): "INR", "PROTIME" in the last 168 hours. CBC: Recent Labs  Lab 03/08/22 1731 03/09/22 0553 03/10/22 0431 03/11/22 0347 03/12/22 0356  WBC 32.8* 28.5* 22.5* 14.6* 10.7*  NEUTROABS 29.8*  --   --   --   --   HGB 11.0* 10.0* 8.7* 8.2* 8.4*  HCT 35.3* 33.2* 28.2* 26.9* 28.4*  MCV 99.2 101.8* 100.0 100.4* 100.7*  PLT 261 241 223 209 226   Cardiac Enzymes: No results for input(s): "CKTOTAL", "CKMB", "CKMBINDEX", "TROPONINI" in the last 168 hours. BNP: Invalid input(s): "POCBNP" CBG: Recent Labs  Lab 03/11/22 2014 03/12/22 0003 03/12/22 0355 03/12/22 0736 03/12/22 1141  GLUCAP 117* 113* 128* 88 83   HbA1C: No results for input(s): "HGBA1C" in the last 72 hours. Urine analysis:    Component Value  Date/Time   COLORURINE YELLOW 02/21/2022 1517   APPEARANCEUR HAZY (A) 02/21/2022 1517   LABSPEC 1.015 02/21/2022 1517   PHURINE 5.0 02/21/2022 1517   GLUCOSEU NEGATIVE 02/21/2022 1517   HGBUR NEGATIVE 02/21/2022 1517   BILIRUBINUR NEGATIVE 02/21/2022 1517   KETONESUR 20 (A) 02/21/2022 1517   PROTEINUR NEGATIVE 02/21/2022 1517   NITRITE NEGATIVE 02/21/2022 1517   LEUKOCYTESUR NEGATIVE 02/21/2022 1517   Sepsis Labs: '@LABRCNTIP'$ (procalcitonin:4,lacticidven:4) ) Recent Results (from the past 240 hour(s))  SARS Coronavirus 2 by RT PCR (hospital order, performed in [facility] hospital lab) *cepheid single result test* Anterior Nasal Swab     Status: None   Collection Time: 03/08/22  5:31 PM   Specimen: Anterior Nasal Swab  Result Value Ref Range Status   SARS Coronavirus 2 by RT PCR NEGATIVE NEGATIVE Final    Comment: (NOTE) SARS-CoV-2 target nucleic acids are NOT DETECTED.  The SARS-CoV-2 RNA is generally detectable in upper and lower respiratory specimens during the acute phase of  infection. The lowest concentration of SARS-CoV-2 viral copies this assay can detect is 250 copies / mL. A negative result does not preclude SARS-CoV-2 infection and should not be used as the sole basis for treatment or other patient management decisions.  A negative result may occur with improper specimen collection / handling, submission of specimen other than nasopharyngeal swab, presence of viral mutation(s) within the areas targeted by this assay, and inadequate number of viral copies (<250 copies / mL). A negative result must be combined with clinical observations, patient history, and epidemiological information.  Fact Sheet for Patients:   https://www.patel.info/  Fact Sheet for Healthcare Providers: https://hall.com/  This test is not yet approved or  cleared by the [place] FDA and has been authorized for detection and/or diagnosis of  SARS-CoV-2 by FDA under an Emergency Use Authorization (EUA).  This EUA will remain in effect (meaning this test can be used) for the duration of the COVID-19 declaration under Section 564(b)(1) of the Act, 21 U.S.C. section 360bbb-3(b)(1), unless the authorization is terminated or revoked sooner.  Performed at [facility], [address], [place], [place] [address]   Blood culture (routine x 2)     Status: None (Preliminary result)   Collection Time: 03/08/22  5:31 PM   Specimen: BLOOD  Result Value Ref Range Status   Specimen Description BLOOD BLOOD LEFT ARM  Final   Special Requests BLOOD  Final   Culture   Final    NO GROWTH 4 DAYS Performed at [facility], [address], [place], [place] [address]    Report Status PENDING  Incomplete  Blood culture (routine x 2)     Status: None (Preliminary result)   Collection Time: 03/08/22  5:40 PM   Specimen: BLOOD  Result Value Ref Range Status   Specimen Description BLOOD LEFT ANTECUBITAL  Final   Special Requests   Final    BOTTLES DRAWN AEROBIC AND ANAEROBIC Blood Culture adequate volume   Culture   Final    NO GROWTH 4 DAYS Performed at [facility], [address] [place], [place] [address]    Report Status PENDING  Incomplete  MRSA Next Gen by PCR, Nasal     Status: None   Collection Time: 03/09/22  8:20 AM   Specimen: Nasal Mucosa; Nasal Swab  Result Value Ref Range Status   MRSA by PCR Next Gen NOT DETECTED NOT DETECTED Final    Comment: (NOTE) The GeneXpert MRSA Assay (FDA approved for NASAL specimens only), is one component of a comprehensive MRSA colonization surveillance program. It is not intended to diagnose MRSA infection nor to guide or monitor treatment for MRSA infections. Test performance is not FDA approved in patients less than 41 years old. Performed at [facility], [address] [place], [place] [address]      Scheduled Meds:  Chlorhexidine Gluconate Cloth  6 each Topical Daily   enoxaparin  (LOVENOX) injection  40 mg Subcutaneous Q24H   furosemide  40 mg Intravenous Once   mouth rinse  15 mL Mouth Rinse 4 times per day   pantoprazole (PROTONIX) IV  40 mg Intravenous Q24H   Continuous Infusions:  piperacillin-tazobactam (ZOSYN)  IV 3.375 g (03/12/22 0812)    Procedures/Studies: DG CHEST PORT 1 VIEW  Result Date: 03/11/2022 CLINICAL DATA:  Respiratory distress. EXAM: PORTABLE CHEST 1 VIEW COMPARISON:  March 08, 2022. FINDINGS: Stable cardiomegaly. Mildly increased bibasilar atelectasis or edema is noted with associated pleural effusions. Bony thorax is unremarkable. IMPRESSION: Mildly  increased bibasilar atelectasis or edema is noted with associated pleural effusion. Electronically Signed   By: Marijo Conception M.D.   On: 03/11/2022 14:36   CT Angio Chest PE W and/or Wo Contrast  Result Date: 03/08/2022 CLINICAL DATA:  Syncopal episode and low oxygen saturation. EXAM: CT ANGIOGRAPHY CHEST WITH CONTRAST TECHNIQUE: Multidetector CT imaging of the chest was performed using the standard protocol during bolus administration of intravenous contrast. Multiplanar CT image reconstructions and MIPs were obtained to evaluate the vascular anatomy. RADIATION DOSE REDUCTION: This exam was performed according to the departmental dose-optimization program which includes automated exposure control, adjustment of the mA and/or kV according to patient size and/or use of iterative reconstruction technique. CONTRAST:  8m OMNIPAQUE IOHEXOL 350 MG/ML SOLN COMPARISON:  02/23/2022 FINDINGS: Cardiovascular: The heart is mildly enlarged but stable. Moderate tortuosity of the thoracic aorta but no focal aneurysm or dissection. Stable aortic and coronary artery calcifications. Stable marked enlargement of the pulmonary arteries suggesting pulmonary hypertension. No filling defects to suggest pulmonary embolism. Mediastinum/Nodes: No mediastinal or hilar mass or adenopathy. The esophagus is grossly. Lungs/Pleura:  Small bilateral pleural effusions. Dense bilateral lower lobe airspace consolidation and air bronchograms likely pulmonary infiltrates. Aspiration would certainly be a consideration. There are also patchy infiltrates in both upper lobes. No pneumothorax. Upper Abdomen: No significant upper abdominal findings. Musculoskeletal: No breast masses, supraclavicular or axillary adenopathy. The bony thorax is stable. Stable severe thoracic kyphosis with midthoracic compression fractures and stable vertebral plana deformity at T11. Review of the MIP images confirms the above findings. IMPRESSION: 1. No CT findings for pulmonary embolism. 2. Stable tortuosity and calcification of the thoracic aorta but no focal aneurysm or dissection. 3. Stable marked enlargement of the pulmonary arteries suggesting pulmonary hypertension. 4. Dense bilateral lower lobe airspace consolidation and air bronchograms likely pulmonary infiltrates. Aspiration would certainly be a consideration. 5. Small bilateral pleural effusions. 6. Stable age advanced atherosclerotic calcifications involving the aorta and coronary arteries. 7. Stable thoracic compression fractures and vertebral plana deformity at T11. 8. Aortic atherosclerosis. Aortic Atherosclerosis (ICD10-I70.0). Electronically Signed   By: PMarijo SanesM.D.   On: 03/08/2022 21:35   DG Chest Port 1 View  Result Date: 03/08/2022 CLINICAL DATA:  Shortness of breath. EXAM: PORTABLE CHEST 1 VIEW COMPARISON:  03/01/2022 FINDINGS: The heart is borderline enlarged but stable. The mediastinal and hilar contours are within normal limits and unchanged. Tortuosity and calcification of the thoracic aorta. Chronic underlying lung changes with areas of pulmonary scarring. Small bilateral pleural effusions noted with bibasilar atelectasis. No obvious infiltrates. IMPRESSION: Chronic underlying lung disease with small bilateral pleural effusions and bibasilar atelectasis. Electronically Signed   By: PMarijo SanesM.D.   On: 03/08/2022 18:02   UK[place]CHEST (PLEURAL EFFUSION)  Result Date: 03/01/2022 CLINICAL DATA:  LEFT pleural effusion EXAM: CHEST ULTRASOUND COMPARISON:  Chest radiograph 03/01/2022 FINDINGS: Small LEFT pleural effusion is identified. Based on patient age/size and condition, this is insufficient for thoracentesis. Atelectasis in LEFT lower lobe. IMPRESSION: Small LEFT pleural effusion insufficient for thoracentesis. Electronically Signed   By: MLavonia DanaM.D.   On: 03/01/2022 12:12   DG CHEST PORT 1 VIEW  Result Date: 03/01/2022 CLINICAL DATA:  Shortness of breath. EXAM: PORTABLE CHEST 1 VIEW COMPARISON:  02/27/2022 FINDINGS: Stable cardiomediastinal contours. Bilateral pleural effusions are noted, left greater than right. Persistent complete retrocardiac opacification of the left lung base. Again noted are patchy bilateral upper lobe and right lung base opacities. These are unchanged compared with  the previous exam. IMPRESSION: 1. No change in aeration to the lungs compared with previous exam. 2. Persistent bilateral pleural effusions, left greater than right. Electronically Signed   By: Kerby Moors M.D.   On: 03/01/2022 08:22   DG ABD ACUTE 2+V W 1V CHEST  Result Date: 02/28/2022 CLINICAL DATA:  Shortness of breath. EXAM: DG ABDOMEN ACUTE WITH 1 VIEW CHEST COMPARISON:  Radiographs 02/27/2022 FINDINGS: Progressive pulmonary infiltrates and probable small bilateral pleural effusions. Contrast noted throughout the colon from a recent swallowing function test. No findings for small bowel obstruction or free air. IMPRESSION: 1. Progressive pulmonary infiltrates and probable small bilateral pleural effusions. 2. No acute abdominal findings. Electronically Signed   By: Marijo Sanes M.D.   On: 02/28/2022 14:36   DG SWALLOW FUNC SPEECH PATH  Result Date: 02/27/2022 Table formatting from the original result was not included. Objective Swallowing Evaluation: Type of Study: MBS-Modified  Barium Swallow Study  Patient Details Name: Kerstyn Coryell MRN: [id number] Date of Birth: Dec 30, 1922 Today's Date: 02/27/2022 Time: SLP Start Time (ACUTE ONLY): 1400 -SLP Stop Time (ACUTE ONLY): 0932 SLP Time Calculation (min) (ACUTE ONLY): 33 min Past Medical History: Past Medical History: Diagnosis Date  Diabetes mellitus without complication ([place])   Hyperlipidemia   Hypertension  Past Surgical History: Past Surgical History: Procedure Laterality Date  ABDOMINAL HYSTERECTOMY    BIOPSY  01/19/2018  Procedure: BIOPSY;  Surgeon: Ronnette Juniper, MD;  Location: [facility];  Service: Gastroenterology;;  CHOLECYSTECTOMY    ESOPHAGOGASTRODUODENOSCOPY (EGD) WITH PROPOFOL N/A 01/19/2018  Procedure: ESOPHAGOGASTRODUODENOSCOPY (EGD) WITH PROPOFOL;  Surgeon: Ronnette Juniper, MD;  Location: [facility];  Service: Gastroenterology;  Laterality: N/A;  HIP SURGERY   HPI: 88 y.o. female with medical history significant of COPD, GERD, hypertension, hyperlipidemia, T2DM admitted on 02/21/2022 with intractable emesis and found to have partial small bowel obstruction  Versus ileus. in the early hours of 02/24/2022 patient developed significant respiratory distress with hypoxia O2 sats down to the 60s  -Required deep suctioning, bronchodilators, high flow oxygen  Arterial Blood Gas result:  pO2 44; pCO2 48; pH 7.3;  HCO3 23.6, %O2 Sat 76.8 on 8 L of [place]  -Currently requiring up to 11 L of oxygen via nasal cannula. BSE requested.  Subjective: "I am getting so tired."  Recommendations for follow up therapy are one component of a multi-disciplinary discharge planning process, led by the attending physician.  Recommendations may be updated based on patient status, additional functional criteria and insurance authorization. Assessment / Plan / Recommendation   02/27/2022   4:00 PM Clinical Impressions Clinical Impression Pt presents with mi/mod oropharyngeal and suspected primary esophageal dysphagia characterized by weak lingual manipulation and prolonged  oral transit, swallow trigger at the level of the valleculae, decreased tongue base retraction and epiglottic deflection which appeared to be negatively impacted by elongated uvulae that nearly meat the tip of the epiglottis and impaired epiglottic deflection and approximation with posterior pharyngeal wall resulting in reduced laryngeal vestibule closure and penetration of thins during the swallow and penetration/aspiration to the vocal folds (difficult to see if below vocal folds at times) in trace amounts and resulting vallecular residue and occasional contrast behind the uvulae. Epiglottis deflected greater with puree. Nectars only spilled over into the laryngeal vestibule in trace amounts, but also resulted in vallecular residue. Esophageal sweep revealed extensive barium contrast diffusely throughout the esophagus that resulted in some backflow/retrograde movement to the cervical esophagus. Pt still currently requires high supplemental oxygen and fatigues quickly, which increases risk for  aspiration and decreases her ability to meet nutritional needs. Pt's daughter indicates that Pt was on nectar thickened liquids about 1-2 years ago and "did not like it". SLP reviewed imaging with Pt's daughter and suggested that we change her to NTL for now, with plan to advance her once she is clinically stronger. Daughter is willing to try this. Question whether Pt's appearance of elongated uvula could be due to some swelling from recent NG placement? Recommend D1/puree and NTL via cup/straw with pacing strategies due to shortness of breath and fatigue and ensure that Pt is sitting upright for all eating/drinking and remains up for at least 30 minutes after meals due to esophageal dsymotility. SLP will follow during acute stay. SLP Visit Diagnosis Dysphagia, pharyngoesophageal phase (R13.14);Dysphagia, oropharyngeal phase (R13.12) Impact on safety and function Mild aspiration risk;Risk for inadequate nutrition/hydration      02/27/2022   4:00 PM Treatment Recommendations Treatment Recommendations Therapy as outlined in treatment plan below     02/27/2022   4:00 PM Prognosis Prognosis for Safe Diet Advancement Good   02/27/2022   4:00 PM Diet Recommendations SLP Diet Recommendations Dysphagia 1 (Puree) solids;Nectar thick liquid Liquid Administration via Cup;Straw Medication Administration Whole meds with puree Compensations Slow rate;Small sips/bites;Multiple dry swallows after each bite/sip Postural Changes Remain semi-upright after after feeds/meals (Comment);Seated upright at 90 degrees     02/27/2022   4:00 PM Other Recommendations Recommended Consults Consider esophageal assessment Oral Care Recommendations Oral care BID;Staff/trained caregiver to provide oral care Other Recommendations Clarify dietary restrictions;Order thickener from pharmacy;Prohibited food (jello, ice cream, thin soups) Follow Up Recommendations Skilled nursing-short term rehab (<3 hours/day) Assistance recommended at discharge Frequent or constant Supervision/Assistance Functional Status Assessment Patient has had a recent decline in their functional status and demonstrates the ability to make significant improvements in function in a reasonable and predictable amount of time.   02/27/2022   4:00 PM Frequency and Duration  Speech Therapy Frequency (ACUTE ONLY) min 2x/week Treatment Duration 1 week     02/27/2022   4:00 PM Oral Phase Oral Phase Impaired Oral - Mech Soft Weak lingual manipulation;Delayed oral transit;Piecemeal swallowing    02/27/2022   4:00 PM Pharyngeal Phase Pharyngeal Phase Impaired Pharyngeal- Nectar Straw Delayed swallow initiation-vallecula;Reduced epiglottic inversion;Penetration/Aspiration during swallow;Pharyngeal residue - valleculae Pharyngeal Material enters airway, remains ABOVE vocal cords then ejected out Pharyngeal- Thin Teaspoon Delayed swallow initiation-vallecula;Reduced epiglottic inversion;Reduced tongue base  retraction;Penetration/Aspiration during swallow;Penetration/Apiration after swallow;Pharyngeal residue - valleculae Pharyngeal Material enters airway, CONTACTS cords and not ejected out;Material enters airway, remains ABOVE vocal cords and not ejected out Pharyngeal- Thin Cup Delayed swallow initiation-vallecula;Reduced epiglottic inversion;Reduced airway/laryngeal closure;Reduced tongue base retraction;Penetration/Aspiration during swallow;Penetration/Apiration after swallow;Trace aspiration;Pharyngeal residue - valleculae Pharyngeal Material enters airway, CONTACTS cords and not ejected out Pharyngeal- Thin Straw Delayed swallow initiation-vallecula;Delayed swallow initiation-pyriform sinuses;Reduced tongue base retraction;Reduced epiglottic inversion;Penetration/Aspiration during swallow;Pharyngeal residue - valleculae Pharyngeal Material enters airway, remains ABOVE vocal cords and not ejected out Pharyngeal- Puree Delayed swallow initiation-vallecula;Reduced tongue base retraction;Reduced epiglottic inversion;Pharyngeal residue - valleculae Pharyngeal- Mechanical Soft Reduced epiglottic inversion;Reduced tongue base retraction;Delayed swallow initiation-vallecula;Pharyngeal residue - valleculae Pharyngeal- Pill WFL;Delayed swallow initiation-vallecula    02/27/2022   4:00 PM Cervical Esophageal Phase  Cervical Esophageal Phase Impaired Puree Esophageal backflow into cervical esophagus Thank you, Genene Churn, [place] 02/27/2022, 5:27 PM                     DG CHEST PORT 1 VIEW  Result Date: 02/27/2022 CLINICAL DATA:  Aspiration  into airway. EXAM: PORTABLE CHEST 1 VIEW COMPARISON:  02/26/2022 and 02/24/2022 FINDINGS: Increased focal density along the periphery of the right upper lung. Slightly increased airspace densities in the medial aspect of the right upper lobe. Again noted are densities at the left lung base and the left hemidiaphragm is obscured. Findings could represent  a combination of pleural fluid and consolidation at the left lung base. Heart size is stable. Trachea is midline. Negative for a pneumothorax. Nasogastric tube has been removed. Surgical clips in the right upper abdomen. IMPRESSION: 1. Increased airspace densities in the right upper lobe. Findings are concerning for pneumonia. 2. Left basilar densities could represent a combination of pleural fluid and airspace disease/consolidation. Electronically Signed   By: Markus Daft M.D.   On: 02/27/2022 08:58   DG ABD ACUTE 2+V W 1V CHEST  Result Date: 02/26/2022 CLINICAL DATA:  Aspiration.  History of small-bowel obstruction. EXAM: DG ABDOMEN ACUTE WITH 1 VIEW CHEST COMPARISON:  Abdominal radiograph 02/25/2022 FINDINGS: Enteric tube appears coiled within the stomach. Stable cardiomegaly and hilar prominence. Similar-appearing left-greater-than-right basilar airspace opacities which may represent atelectasis. Possible trace bilateral pleural effusions. No pneumothorax. Thoracic spine degenerative changes. Oral contrast material within the descending colon. There are a few gaseous distended loops of small bowel within the central abdomen. Surgical clips right upper quadrant. Right hip arthroplasty. Lumbar spine degenerative changes. IMPRESSION: 1. A few gaseous distended loops of small bowel within the central abdomen, similar to prior. 2. Bibasilar atelectasis. 3. Oral contrast material within the descending colon. 4. Enteric tube appears coiled within the stomach. Electronically Signed   By: Lovey Newcomer M.D.   On: 02/26/2022 08:08   DG ABD ACUTE 2+V W 1V CHEST  Result Date: 02/25/2022 CLINICAL DATA:  Respiratory failure with hypoxia. Bowel obstruction. EXAM: DG ABDOMEN ACUTE WITH 1 VIEW CHEST COMPARISON:  02/24/2022 FINDINGS: Orogastric tube is seen with tip in the distal antrum or duodenal bulb. Oral contrast material is now seen within nondilated left colon. Decreased small bowel dilatation is seen since prior  exam. Contrast noted in the urinary bladder from recent CT. Cardiomegaly is stable. Enlarged pulmonary arteries are consistent with pulmonary arterial hypertension. Increased opacity in both lung bases may be due to atelectasis or infiltrate. No evidence of pleural effusion or pneumothorax. IMPRESSION: Decreased small bowel dilatation since prior exam. Oral contrast material now seen in nondilated left colon. Increased bibasilar atelectasis versus infiltrate. Stable cardiomegaly and pulmonary arterial hypertension. Electronically Signed   By: Marlaine Hind M.D.   On: 02/25/2022 09:16   DG ABD ACUTE 2+V W 1V CHEST  Result Date: 02/24/2022 CLINICAL DATA:  88 year old female with resolving small bowel obstruction on CT yesterday, oral contrast reached the colon. EXAM: DG ABDOMEN ACUTE WITH 1 VIEW CHEST COMPARISON:  CT Chest, Abdomen, and Pelvis yesterday and earlier. FINDINGS: Portable upright AP view of the chest at 0719 hours. Enteric tube terminates in the distal stomach or duodenum, side hole at the level of the gastric body. Stable cholecystectomy clips. No pneumoperitoneum or pneumothorax. Stable lung bases and mediastinum. Portable upright and supine views the abdomen at 0719 hours. Retained oral contrast appears to be largely within the colon now from the transverse to the sigmoid. Excreted IV contrast in the urinary bladder. Superimposed gas-filled small bowel loops up to 3.5 cm diameter are not significantly changed from the CT yesterday. Right hip arthroplasty.  No acute osseous abnormality identified. IMPRESSION: 1. Stable bowel gas pattern from the CT yesterday. Retained oral contrast now  primarily in the colon. No free air. 2. Stable chest. Enteric tube terminates in the distal stomach or duodenum. Electronically Signed   By: Genevie Ann M.D.   On: 02/24/2022 09:21   CT CHEST W CONTRAST  Result Date: 02/23/2022 CLINICAL DATA:  Intractable emesis, partial small bowel obstruction versus ileus, abnormal  density right upper chest EXAM: CT CHEST, ABDOMEN, AND PELVIS WITH CONTRAST TECHNIQUE: Multidetector CT imaging of the chest, abdomen and pelvis was performed following the standard protocol during bolus administration of intravenous contrast. RADIATION DOSE REDUCTION: This exam was performed according to the departmental dose-optimization program which includes automated exposure control, adjustment of the mA and/or kV according to patient size and/or use of iterative reconstruction technique. CONTRAST:  24m OMNIPAQUE IOHEXOL 300 MG/ML  SOLN COMPARISON:  02/23/2022, 02/21/2022 FINDINGS: CT CHEST FINDINGS Cardiovascular: Heart is enlarged without pericardial effusion. Extensive atherosclerosis of the coronary vasculature unchanged. No evidence of thoracic aortic aneurysm or dissection. Stable aortic atherosclerosis. Dilated main pulmonary trunk consistent with pulmonary arterial hypertension. Mediastinum/Nodes: Enteric catheter extends into the gastric lumen, tip in the region of the duodenal bulb. Thyroid and trachea are unremarkable. No pathologic adenopathy. Lungs/Pleura: No airspace disease, effusion, or pneumothorax. Density in the right upper chest on preceding x-ray likely reflect a superimposed vascular shadow given patient rotation. Central airways are patent. Musculoskeletal: There are no acute displaced fractures. Chronic compression deformities are seen at T4, T5, and T11. Reconstructed images demonstrate no additional findings. CT ABDOMEN PELVIS FINDINGS Hepatobiliary: No focal liver abnormality is seen. Status post cholecystectomy. No biliary dilatation. Pancreas: Pancreas is atrophic. No focal abnormalities or pancreatic duct dilation. Spleen: Normal in size without focal abnormality. Adrenals/Urinary Tract: Kidneys are unremarkable without urinary tract calculi or obstructive uropathy. The adrenals and bladder are grossly unremarkable. Evaluation of the bladder slightly limited due to streak  artifact from right hip arthroplasty. Stomach/Bowel: There has been transit of oral contrast into the colon by the time of imaging, excluding high-grade obstruction. There are some residual dilated loops of small bowel within the left mid abdomen, which may be due to resolving obstruction or ileus. No bowel wall thickening or inflammatory change. Vascular/Lymphatic: Aortic atherosclerosis. No enlarged abdominal or pelvic lymph nodes. Reproductive: Status post hysterectomy. No adnexal masses. Other: No free fluid or free intraperitoneal gas. No abdominal wall hernia. Musculoskeletal: There are no acute or destructive bony lesions. Right hip arthroplasty is unremarkable. Reconstructed images demonstrate no additional findings. IMPRESSION: 1. Resolving small-bowel obstruction or ileus, with transit of oral contrast into the colon at the time of imaging. Minimal residual small bowel dilatation in the left mid abdomen. 2. No acute intrathoracic process. The density seen in the right upper chest on preceding x-ray is consistent with superimposed vascular shadows given patient rotation on that exam. 3. Cardiomegaly. 4. Aortic Atherosclerosis (ICD10-I70.0). Coronary artery atherosclerosis. Electronically Signed   By: MRanda NgoM.D.   On: 02/23/2022 16:45   CT ABDOMEN PELVIS W CONTRAST  Result Date: 02/23/2022 CLINICAL DATA:  Intractable emesis, partial small bowel obstruction versus ileus, abnormal density right upper chest EXAM: CT CHEST, ABDOMEN, AND PELVIS WITH CONTRAST TECHNIQUE: Multidetector CT imaging of the chest, abdomen and pelvis was performed following the standard protocol during bolus administration of intravenous contrast. RADIATION DOSE REDUCTION: This exam was performed according to the departmental dose-optimization program which includes automated exposure control, adjustment of the mA and/or kV according to patient size and/or use of iterative reconstruction technique. CONTRAST:  518mOMNIPAQUE  IOHEXOL 300 MG/ML  SOLN  COMPARISON:  02/23/2022, 02/21/2022 FINDINGS: CT CHEST FINDINGS Cardiovascular: Heart is enlarged without pericardial effusion. Extensive atherosclerosis of the coronary vasculature unchanged. No evidence of thoracic aortic aneurysm or dissection. Stable aortic atherosclerosis. Dilated main pulmonary trunk consistent with pulmonary arterial hypertension. Mediastinum/Nodes: Enteric catheter extends into the gastric lumen, tip in the region of the duodenal bulb. Thyroid and trachea are unremarkable. No pathologic adenopathy. Lungs/Pleura: No airspace disease, effusion, or pneumothorax. Density in the right upper chest on preceding x-ray likely reflect a superimposed vascular shadow given patient rotation. Central airways are patent. Musculoskeletal: There are no acute displaced fractures. Chronic compression deformities are seen at T4, T5, and T11. Reconstructed images demonstrate no additional findings. CT ABDOMEN PELVIS FINDINGS Hepatobiliary: No focal liver abnormality is seen. Status post cholecystectomy. No biliary dilatation. Pancreas: Pancreas is atrophic. No focal abnormalities or pancreatic duct dilation. Spleen: Normal in size without focal abnormality. Adrenals/Urinary Tract: Kidneys are unremarkable without urinary tract calculi or obstructive uropathy. The adrenals and bladder are grossly unremarkable. Evaluation of the bladder slightly limited due to streak artifact from right hip arthroplasty. Stomach/Bowel: There has been transit of oral contrast into the colon by the time of imaging, excluding high-grade obstruction. There are some residual dilated loops of small bowel within the left mid abdomen, which may be due to resolving obstruction or ileus. No bowel wall thickening or inflammatory change. Vascular/Lymphatic: Aortic atherosclerosis. No enlarged abdominal or pelvic lymph nodes. Reproductive: Status post hysterectomy. No adnexal masses. Other: No free fluid or free  intraperitoneal gas. No abdominal wall hernia. Musculoskeletal: There are no acute or destructive bony lesions. Right hip arthroplasty is unremarkable. Reconstructed images demonstrate no additional findings. IMPRESSION: 1. Resolving small-bowel obstruction or ileus, with transit of oral contrast into the colon at the time of imaging. Minimal residual small bowel dilatation in the left mid abdomen. 2. No acute intrathoracic process. The density seen in the right upper chest on preceding x-ray is consistent with superimposed vascular shadows given patient rotation on that exam. 3. Cardiomegaly. 4. Aortic Atherosclerosis (ICD10-I70.0). Coronary artery atherosclerosis. Electronically Signed   By: Randa Ngo M.D.   On: 02/23/2022 16:45   DG ABD ACUTE 2+V W 1V CHEST  Result Date: 02/23/2022 CLINICAL DATA:  Small-bowel obstruction. EXAM: DG ABDOMEN ACUTE WITH 1 VIEW CHEST COMPARISON:  February 21, 2022 FINDINGS: Enteric tube courses into the abdomen. Tip likely in the area of the proximal duodenum. Trachea midline. Cardiomediastinal contours and hilar structures are stable with signs of cardiomegaly. No consolidation. Subtle added density over the RIGHT upper chest. This is about the third and fourth ribs posteriorly and the clavicle, an area of significant overlap but is asymmetric to the other side. No pneumothorax. No free air beneath either the RIGHT or LEFT hemidiaphragm. Scattered loops of gas-filled bowel throughout the abdomen. Degree of distension slightly improved compared to previous imaging still with some mild to moderate distension of bowel loops in the LEFT mid abdomen. Post partial colonic resection as before. Stool and gas scattered throughout the colon with gas in the rectum. Degenerative changes throughout the spine. RIGHT hip arthroplasty. Osteopenia. IMPRESSION: 1. Enteric tube courses into the abdomen. Tip likely in the area of the proximal duodenum. 2. Scattered loops of gas-filled bowel  throughout the abdomen. Degree of distension slightly improved compared to previous imaging still with some mild to moderate distension of bowel loops in the LEFT mid abdomen. 3. No free air. 4. Added density over the RIGHT upper chest, image slightly rotated and could project  vascular structures over the upper chest. Density is however more than expected just above the RIGHT hilum and underlying nodule up to 2.3 cm is considered. Given nonstandard positioning currently would suggest CT of the chest for further evaluation on follow-up. Electronically Signed   By: Zetta Bills M.D.   On: 02/23/2022 08:12   US Venous Img Lower Unilateral Right (DVT)  Result Date: 02/22/2022 CLINICAL DATA:  Right leg swelling EXAM: RIGHT LOWER EXTREMITY VENOUS DOPPLER ULTRASOUND TECHNIQUE: Gray-scale sonography with compression, as well as color and duplex ultrasound, were performed to evaluate the deep venous system(s) from the level of the common femoral vein through the popliteal and proximal calf veins. COMPARISON:  None available FINDINGS: VENOUS Normal compressibility of the common femoral, superficial femoral, and popliteal veins, as well as the visualized calf veins. Visualized portions of profunda femoral vein and great saphenous vein unremarkable. No filling defects to suggest DVT on grayscale or color Doppler imaging. Doppler waveforms show normal direction of venous flow, normal respiratory plasticity and response to augmentation. Limited views of the contralateral common femoral vein are unremarkable. OTHER None. Limitations: none IMPRESSION: No right lower extremity DVT. Electronically Signed   By: Miachel Roux M.D.   On: 02/22/2022 13:22   DG Chest Portable 1 View  Result Date: 02/21/2022 CLINICAL DATA:  Enteric catheter placement EXAM: PORTABLE CHEST 1 VIEW COMPARISON:  08/18/2020 FINDINGS: Single frontal view of the chest demonstrates enteric catheter passing below diaphragm, tip excluded by collimation. Side  port projects over the gastric fundus. Cardiac silhouette is enlarged but stable. No airspace disease, effusion, or pneumothorax. No acute bony abnormality. IMPRESSION: 1. Enteric catheter coiled over the stomach, tip excluded by collimation. The side port projects over the gastric fundus. Electronically Signed   By: Randa Ngo M.D.   On: 02/21/2022 20:01   DG Abd Portable 1 View  Result Date: 02/21/2022 CLINICAL DATA:  Enteric catheter placement EXAM: PORTABLE ABDOMEN - 1 VIEW COMPARISON:  02/21/2022 FINDINGS: Frontal view of the lower chest and upper abdomen demonstrates enteric catheter passing below diaphragm, looped back upon itself with tip and side port extending retrograde in the region of the distal thoracic esophagus. Recommend removal and replacement. Lung bases are clear. Gaseous distention of the small bowel consistent with obstruction as seen on recent CT. IMPRESSION: 1. Enteric catheter as above, coiled back upon itself with tip projecting over the distal thoracic esophagus. Recommend removal and replacement. 2. Small bowel obstruction. Electronically Signed   By: Randa Ngo M.D.   On: 02/21/2022 18:34   CT Abdomen Pelvis W Contrast  Result Date: 02/21/2022 CLINICAL DATA:  Difficulty urinating. EXAM: CT ABDOMEN AND PELVIS WITH CONTRAST TECHNIQUE: Multidetector CT imaging of the abdomen and pelvis was performed using the standard protocol following bolus administration of intravenous contrast. RADIATION DOSE REDUCTION: This exam was performed according to the departmental dose-optimization program which includes automated exposure control, adjustment of the mA and/or kV according to patient size and/or use of iterative reconstruction technique. CONTRAST:  193m OMNIPAQUE IOHEXOL 300 MG/ML  SOLN COMPARISON:  None Available. FINDINGS: Lower chest: No acute abnormality. Hepatobiliary: No focal liver abnormality is seen. Status post cholecystectomy. No biliary dilatation. Pancreas:  Unremarkable. No pancreatic ductal dilatation or surrounding inflammatory changes. Spleen: Normal in size without focal abnormality. Adrenals/Urinary Tract: Adrenal glands are unremarkable. Kidneys are normal in size with mild areas of renal cortical thinning noted. There is no evidence of renal calculi or hydronephrosis. Bladder is unremarkable. Stomach/Bowel: Stomach is within normal limits.  The appendix is not identified. Multiple dilated small bowel loops are seen throughout the abdomen and pelvis (maximum small bowel diameter of approximately 3.8 cm). A gradual transition zone is seen within the lateral aspect of the mid to lower right abdomen. Numerous noninflamed diverticula are seen throughout the sigmoid colon. Vascular/Lymphatic: Aortic atherosclerosis. No enlarged abdominal or pelvic lymph nodes. Reproductive: Status post hysterectomy. No adnexal masses. Other: No abdominal wall hernia or abnormality. No abdominopelvic ascites. Musculoskeletal: A total right hip replacement is seen with a marked amount of associated streak artifact. Subsequently limited evaluation of the adjacent osseous and soft tissue structures is noted. A chronic compression fracture deformity is seen at the level of T11. Multilevel degenerative changes are seen throughout the remainder of the lumbar spine. IMPRESSION: 1. Findings consistent with a partial small bowel obstruction versus ileus. 2. Sigmoid diverticulosis. 3. Total right hip replacement. 4. Chronic compression fracture deformity at the level of T11. 5. Aortic atherosclerosis. Aortic Atherosclerosis (ICD10-I70.0). Electronically Signed   By: Virgina Norfolk M.D.   On: 02/21/2022 17:12    Orson Eva, DO  [facility]  If 7PM-7AM, please contact night-coverage [email or web address] Password [email or web address] 03/12/2022, 12:21 PM   LOS: 4 days

## 2022-03-12 NOTE — Plan of Care (Signed)
  Problem: Acute Rehab PT Goals(only PT should resolve) Goal: Pt Will Go Supine/Side To Sit Outcome: Progressing Flowsheets (Taken 03/12/2022 1545) Pt will go Supine/Side to Sit: with minimal assist Goal: Patient Will Transfer Sit To/From Stand Outcome: Progressing Flowsheets (Taken 03/12/2022 1545) Patient will transfer sit to/from stand: with minimal assist Goal: Pt Will Transfer Bed To Chair/Chair To Bed Outcome: Progressing Flowsheets (Taken 03/12/2022 1545) Pt will Transfer Bed to Chair/Chair to Bed: with mod assist Goal: Pt Will Ambulate Outcome: Progressing Flowsheets (Taken 03/12/2022 1545) Pt will Ambulate:  with moderate assist  25 feet  with rolling walker   Zigmund Gottron, SPT

## 2022-03-12 NOTE — Evaluation (Addendum)
Physical Therapy Evaluation Patient Details Name: Robin Mcclure MRN: [id number] DOB: May 28, 1922 Today's Date: 03/12/2022  History of Present Illness  Robin Mcclure is a 88 y.o. female with medical history significant of  COPD, GERD, hypertension, hyperlipidemia, T2DM who presents to the emergency department via EMS from home SNF due to presumed syncope.  Patient was on BiPAP I was unable to provide history, most of the history history was obtained from ED physician and daughter at bedside.  Per report, patient was reported to have passed out while receiving breathing treatment at the nursing facility, EMS was activated, however, patient already regained consciousness by the time EMS team arrived.  O2 sats was reported to be in the 70% throughout the day today and chest x-ray done earlier today at the nursing facility was reported to have pneumonia per her daughter.  Patient was recently admitted to this hospital from 10/4 to 10/16 due to partial small bowel obstruction versus ileus which was managed with NG tube with resolution of SBO on subsequent repeat x-ray.   Clinical Impression  Patient with labored movement for sitting up at bedside. Patient SpO2 at 98% on 5 LPM at rest. Patient able to stand with mod assist, transfer to the bedside commode and finally to the chair with max assist/RW. Patient very unsteady on feet, requiring verbal cueing for walker placement and limited by fatigue, generalized weakness, and urine incontinence. Patient kept on 5 LPM due to SpO2 dropping to 86% upon exertion. Patient tolerated sitting up in chair after therapy with daughter present and SpO2 increasing to 92% with rest. Patient will benefit from continued skilled physical therapy in hospital and recommended venue below to increase strength, balance, endurance for safe ADLs and gait.     Recommendations for follow up therapy are one component of a multi-disciplinary discharge planning process, led by the attending  physician.  Recommendations may be updated based on patient status, additional functional criteria and insurance authorization.  Follow Up Recommendations Skilled nursing-short term rehab (<3 hours/day) Can patient physically be transported by private vehicle: No    Assistance Recommended at Discharge Intermittent Supervision/Assistance  Patient can return home with the following  A lot of help with walking and/or transfers;A lot of help with bathing/dressing/bathroom;Assistance with cooking/housework;Help with stairs or ramp for entrance    Equipment Recommendations None recommended by PT  Recommendations for Other Services       Functional Status Assessment Patient has had a recent decline in their functional status and demonstrates the ability to make significant improvements in function in a reasonable and predictable amount of time.     Precautions / Restrictions Precautions Precautions: Fall Restrictions Weight Bearing Restrictions: No      Mobility  Bed Mobility Overal bed mobility: Needs Assistance Bed Mobility: Supine to Sit     Supine to sit: Mod assist     General bed mobility comments: patient with labored movement, requires mod assist for elevating trunk and scooting to EOB    Transfers Overall transfer level: Needs assistance Equipment used: Rolling walker (2 wheels) Transfers: Sit to/from Stand, Bed to chair/wheelchair/BSC Sit to Stand: Mod assist   Step pivot transfers: Mod assist, Max assist       General transfer comment: patient with slow labored movement for transfers to bedside commode then to chair, limited due to fatigue, weakness and urine/stool incontinence    Ambulation/Gait Ambulation/Gait assistance: Max assist Gait Distance (Feet): 5 Feet Assistive device: Rolling walker (2 wheels) Gait Pattern/deviations: Decreased step length - left,  Decreased step length - right, Decreased stride length, Trunk flexed Gait velocity: decreased      General Gait Details: patient limtited to a few effortful side steps at bedside with RW for transfers. SpO2 at 98% at rest on 5 LPM O2, SpO2 decreased to 86% with activity, increasing to 92% at rest following activity. Patient kept on 5 LPM  Stairs            Wheelchair Mobility    Modified Rankin (Stroke Patients Only)       Balance Overall balance assessment: Needs assistance Sitting-balance support: Bilateral upper extremity supported, Feet supported Sitting balance-Leahy Scale: Fair Sitting balance - Comments: seated EOB   Standing balance support: Bilateral upper extremity supported, During functional activity, Reliant on assistive device for balance Standing balance-Leahy Scale: Poor Standing balance comment: poor/fair with RW                             Pertinent Vitals/Pain Pain Assessment Pain Assessment: No/denies pain    Home Living Family/patient expects to be discharged to:: Skilled nursing facility Living Arrangements: Children Available Help at Discharge: Family;Available 24 hours/day Type of Home: House Home Access: Ramped entrance       Home Layout: One level Home Equipment: Shower seat - built in;Grab bars - toilet;Grab bars - tub/shower;Rolling Walker (2 wheels);Cane - single point;Transport chair      Prior Function Prior Level of Function : Needs assist       Physical Assist : Mobility (physical);ADLs (physical) Mobility (physical): Bed mobility;Transfers;Gait;Stairs ADLs (physical): Grooming;Bathing;Toileting;IADLs;Dressing Mobility Comments: Patient is a household ambulator using RW ADLs Comments: Patient gets assistance from daughter for most ADLs and all iADLs     Hand Dominance   Dominant Hand: Right    Extremity/Trunk Assessment   Upper Extremity Assessment Upper Extremity Assessment: Generalized weakness    Lower Extremity Assessment Lower Extremity Assessment: Generalized weakness    Cervical / Trunk  Assessment Cervical / Trunk Assessment: Kyphotic  Communication   Communication: HOH  Cognition Arousal/Alertness: Awake/alert Behavior During Therapy: WFL for tasks assessed/performed Overall Cognitive Status: Within Functional Limits for tasks assessed                                          General Comments      Exercises     Assessment/Plan    PT Assessment Patient needs continued PT services  PT Problem List Decreased activity tolerance;Decreased balance;Decreased mobility;Decreased strength       PT Treatment Interventions DME instruction;Gait training;Balance training;Stair training;Patient/family education;Functional mobility training;Therapeutic activities;Therapeutic exercise    PT Goals (Current goals can be found in the Care Plan section)  Acute Rehab PT Goals Patient Stated Goal: return home with family to assist after rehab PT Goal Formulation: With patient/family Time For Goal Achievement: 03/26/22 Potential to Achieve Goals: Good    Frequency Min 3X/week     Co-evaluation               AM-PAC PT "6 Clicks" Mobility  Outcome Measure Help needed turning from your back to your side while in a flat bed without using bedrails?: A Lot Help needed moving from lying on your back to sitting on the side of a flat bed without using bedrails?: A Lot Help needed moving to and from a bed to a chair (including a wheelchair)?: A  Lot Help needed standing up from a chair using your arms (e.g., wheelchair or bedside chair)?: A Lot Help needed to walk in hospital room?: A Lot Help needed climbing 3-5 steps with a railing? : A Lot 6 Click Score: 12    End of Session Equipment Utilized During Treatment: Oxygen Activity Tolerance: Patient tolerated treatment well;Patient limited by fatigue Patient left: in chair;with call bell/phone within reach;with family/visitor present Nurse Communication: Mobility status PT Visit Diagnosis: Unsteadiness on  feet (R26.81);Other abnormalities of gait and mobility (R26.89);Muscle weakness (generalized) (M62.81)    Time: 9326-7124 PT Time Calculation (min) (ACUTE ONLY): 33 min   Charges:   PT Evaluation $PT Eval Moderate Complexity: 1 Mod PT Treatments $Therapeutic Activity: 23-37 mins        Zigmund Gottron, SPT

## 2022-03-12 NOTE — Consult Note (Signed)
Palliative Care Consult Note                                  Date: 03/12/2022   Patient Name: Robin Mcclure  DOB: 08-07-1922  MRN: [id number]  Age / Sex: 88 y.o., female  PCP: Robin Amel, MD Referring Physician: Orson Eva, MD  Reason for Consultation: Establishing goals of care  HPI/Patient Profile: 88 y.o. female  with past medical history of diabetes mellitus type 2, COPD, hypertension, hyperlipidemia, GERD, and dysphagia presenting from [facility] secondary to altered level of consciousness and hypoxia.  She was admitted on 03/08/2022 with sepsis, aspiration pneumonia, acute respiratory failure with hypoxia, COPD, and others.   Apparently over the weekend the patient had a decomp or minus respiratory status and was satting in the mid 70s on 6 L/min and so she was placed back on BiPAP.  PMT was consulted for goals of care conversations.  Past Medical History:  Diagnosis Date   COPD (chronic obstructive pulmonary disease) ([place])    Diabetes mellitus without complication (HCC)    Dysphagia    GERD (gastroesophageal reflux disease)    Hyperlipidemia    Hypertension     Subjective:   This NP Robin Mcclure reviewed medical records, received report from team, assessed the patient and then meet at the patient's bedside to discuss diagnosis, prognosis, GOC, EOL wishes disposition and options.  I met with the patient and her daughter at the bedside.  Of note the patient is very hard of hearing.   Concept of Palliative Care was introduced as specialized medical care for people and their families living with serious illness.  If focuses on providing relief from the symptoms and stress of a serious illness.  The goal is to improve quality of life for both the patient and the family. Values and goals of care important to patient and family were attempted to be elicited.  Created space and opportunity for patient  and family to explore  thoughts and feelings regarding current medical situation   Natural trajectory and current clinical status were discussed. Questions and concerns addressed. Patient  encouraged to call with questions or concerns.    Patient/Family Understanding of Illness: The patient's daughter understands her current respiratory status is not good.  She has aspiration pneumonia and subsequent respiratory failure.  On the 22nd she had to go back on BiPAP and there is increasing bibasilar opacities and effusions, she was weaned to 5 L/min yesterday.  We discussed aspiration pneumonia and whether this caused a respiratory failure or whether her acute illness caused the aspiration due to change in mental status.  We noted that SLP is planning to further evaluate to help [place] answer this.  However, if she has high risk for recurrent aspiration pneumonia she will likely have pneumonia again.  Goals: To treat the treatable and plan for SNF/rehab for strengthening, but if the patient has significant decline daughter seems on board with further discussions.  Today's Discussion: In addition to discussions described above we had extensive discussion on various topics.  We discussed the daughter severe over this weekend that her mother would not survive given her worsening respiratory status.  She states she was quite surprised when she came in this morning, expecting the worst, but finding her mom awake and talking with less oxygen.  She states that DNR discussion was very difficult for her, although she does appreciate the need for  DNR in with the state of her mom's health.  She jokes that she often tells herself "she is only 88 years old."  She states this acute care episode has forced her to kind of look at things a bit differently and realize that her mom will not be around forever.  We confirmed DNR status.  We discussed that if the patient further de compromises we will have to likely have further discussions.  She  verbalizes understanding.  At this time her goal is to treat the treatable, plan for SNF/rehab for strengthening send the patient can go home with her daughter.  We discussed outpatient palliative care and the patient's daughter states that she already has outpatient palliative care with [facility] on board since mid 2022 and she is very grateful for they have been able to do.  We agreed to take it a day at a time moving forward.  I provided emotional and general support through therapeutic listening, empathy, sharing of stories, therapeutic touch, and other techniques. I answered all questions and addressed all concerns to the best of my ability.  Review of Systems  Constitutional:  Positive for fatigue.       Denies pain in general  Respiratory:  Positive for shortness of breath (improved).   Gastrointestinal:  Negative for abdominal pain, nausea and vomiting.    Objective:   Primary Diagnoses: Present on Admission:  Hypoalbuminemia due to protein-calorie malnutrition ([place])  Uncontrolled type 2 diabetes mellitus with hyperglycemia (HCC)  Chronic obstructive pulmonary disease (HCC)  Gastroesophageal reflux disease  Essential hypertension   Physical Exam Vitals and nursing note reviewed.  Constitutional:      General: She is not in acute distress.    Appearance: She is ill-appearing.  HENT:     Head: Normocephalic and atraumatic.     Ears:     Comments: Very hard of hearing Cardiovascular:     Rate and Rhythm: Normal rate.  Pulmonary:     Effort: No respiratory distress.     Comments: Appears mildly short of breath, sats ok Abdominal:     General: Abdomen is flat.     Palpations: Abdomen is soft.  Skin:    General: Skin is warm and dry.  Psychiatric:        Mood and Affect: Mood normal.        Behavior: Behavior normal.     Vital Signs:  BP 119/64   Pulse 76   Temp (!) 97.3 F (36.3 C) (Axillary)   Resp 19   Ht _0  (1.651 m)   Wt 58.5 kg   SpO2 96%    BMI 21.46 kg/m   Palliative Assessment/Data: 40-50%    Advanced Care Planning:   Primary Decision Maker: PATIENT with assistance of her daughter Robin Mcclure  Code Status/Advance Care Planning: DNR  A discussion was had today regarding advanced directives. Concepts specific to code status, artifical feeding and hydration, continued IV antibiotics and rehospitalization was had.  The difference between a aggressive medical intervention path and a palliative comfort care path for this patient at this time was had.   Decisions/Changes to ACP: Remain DNR  Assessment & Plan:   Impression: 88 year old female with acute presentation and chronic conditions as noted above.  Extensive discussion with the patient's daughter.  She had quite a scare this weekend with worsening respiratory compromise and the patient's daughter felt that she would not survive.  However, this morning she appears to be doing better on less oxygen  needs.  We discussed respiratory failure due to aspiration pneumonia.  Planned SLP evaluation and if noted dysphagia/high risk aspiration then there is a chance that she will continue to aspirate.  We agreed to take it a day to time, treat the treatable, try to get to SNF/rehab for strengthening.  Recommended continued outpatient palliative care as already established.  Overall long-term prognosis poor.  SUMMARY OF RECOMMENDATIONS   Remain DNR Treat the treatable Work toward discharge to SNF/rehab Further [place] conversations pending evolution of clinical picture PMT will continue to follow  Symptom Management:  Primary team PMT is available to assist as needed  Prognosis:  Unable to determine  Discharge Planning:  [place] for rehab with Palliative care service follow-up   Discussed with: Patient, family, nursing team, medical team    Thank you for allowing [place] to participate in the care of Tricia Pledger PMT will continue to support  holistically.  Time Total: 95 minutes  Greater than 50%  of this time was spent counseling and coordinating care related to the above assessment and plan.  Signed by: Robin Field, NP Palliative Medicine Team  Team Phone # [phone] (Nights/Weekends)  03/12/2022, 3:31 PM

## 2022-03-13 DIAGNOSIS — E870 Hyperosmolality and hypernatremia: Secondary | ICD-10-CM | POA: Diagnosis not present

## 2022-03-13 DIAGNOSIS — J9601 Acute respiratory failure with hypoxia: Secondary | ICD-10-CM | POA: Diagnosis not present

## 2022-03-13 DIAGNOSIS — F419 Anxiety disorder, unspecified: Secondary | ICD-10-CM | POA: Diagnosis not present

## 2022-03-13 DIAGNOSIS — J449 Chronic obstructive pulmonary disease, unspecified: Secondary | ICD-10-CM | POA: Diagnosis not present

## 2022-03-13 DIAGNOSIS — K219 Gastro-esophageal reflux disease without esophagitis: Secondary | ICD-10-CM | POA: Diagnosis not present

## 2022-03-13 DIAGNOSIS — A419 Sepsis, unspecified organism: Secondary | ICD-10-CM | POA: Diagnosis not present

## 2022-03-13 DIAGNOSIS — J189 Pneumonia, unspecified organism: Secondary | ICD-10-CM | POA: Diagnosis not present

## 2022-03-13 DIAGNOSIS — I959 Hypotension, unspecified: Secondary | ICD-10-CM | POA: Diagnosis not present

## 2022-03-13 DIAGNOSIS — J90 Pleural effusion, not elsewhere classified: Secondary | ICD-10-CM | POA: Diagnosis not present

## 2022-03-13 DIAGNOSIS — R52 Pain, unspecified: Secondary | ICD-10-CM | POA: Diagnosis not present

## 2022-03-13 DIAGNOSIS — J690 Pneumonitis due to inhalation of food and vomit: Secondary | ICD-10-CM | POA: Diagnosis not present

## 2022-03-13 DIAGNOSIS — R1310 Dysphagia, unspecified: Secondary | ICD-10-CM | POA: Diagnosis not present

## 2022-03-13 DIAGNOSIS — Z7401 Bed confinement status: Secondary | ICD-10-CM | POA: Diagnosis not present

## 2022-03-13 DIAGNOSIS — E119 Type 2 diabetes mellitus without complications: Secondary | ICD-10-CM | POA: Diagnosis not present

## 2022-03-13 DIAGNOSIS — L89153 Pressure ulcer of sacral region, stage 3: Secondary | ICD-10-CM | POA: Diagnosis not present

## 2022-03-13 DIAGNOSIS — E785 Hyperlipidemia, unspecified: Secondary | ICD-10-CM | POA: Diagnosis not present

## 2022-03-13 DIAGNOSIS — E1165 Type 2 diabetes mellitus with hyperglycemia: Secondary | ICD-10-CM | POA: Diagnosis not present

## 2022-03-13 DIAGNOSIS — K56600 Partial intestinal obstruction, unspecified as to cause: Secondary | ICD-10-CM | POA: Diagnosis not present

## 2022-03-13 DIAGNOSIS — Z79899 Other long term (current) drug therapy: Secondary | ICD-10-CM | POA: Diagnosis not present

## 2022-03-13 DIAGNOSIS — Z7189 Other specified counseling: Secondary | ICD-10-CM | POA: Diagnosis not present

## 2022-03-13 DIAGNOSIS — E46 Unspecified protein-calorie malnutrition: Secondary | ICD-10-CM | POA: Diagnosis not present

## 2022-03-13 DIAGNOSIS — E782 Mixed hyperlipidemia: Secondary | ICD-10-CM | POA: Diagnosis not present

## 2022-03-13 DIAGNOSIS — I700 Atherosclerosis of aorta: Secondary | ICD-10-CM | POA: Diagnosis not present

## 2022-03-13 DIAGNOSIS — I10 Essential (primary) hypertension: Secondary | ICD-10-CM | POA: Diagnosis not present

## 2022-03-13 DIAGNOSIS — S22089D Unspecified fracture of T11-T12 vertebra, subsequent encounter for fracture with routine healing: Secondary | ICD-10-CM | POA: Diagnosis not present

## 2022-03-13 LAB — GLUCOSE, CAPILLARY
Glucose-Capillary: 112 mg/dL — ABNORMAL HIGH (ref 70–99)
Glucose-Capillary: 124 mg/dL — ABNORMAL HIGH (ref 70–99)
Glucose-Capillary: 157 mg/dL — ABNORMAL HIGH (ref 70–99)

## 2022-03-13 LAB — CULTURE, BLOOD (ROUTINE X 2)
Culture: NO GROWTH
Culture: NO GROWTH
Special Requests: ADEQUATE

## 2022-03-13 LAB — BASIC METABOLIC PANEL
Anion gap: 9 (ref 5–15)
BUN: 16 mg/dL (ref 8–23)
CO2: 26 mmol/L (ref 22–32)
Calcium: 8.7 mg/dL — ABNORMAL LOW (ref 8.9–10.3)
Chloride: 113 mmol/L — ABNORMAL HIGH (ref 98–111)
Creatinine, Ser: 0.75 mg/dL (ref 0.44–1.00)
GFR, Estimated: >60 mL/min (ref >60)
Glucose, Bld: 121 mg/dL — ABNORMAL HIGH (ref 70–99)
Potassium: 2.7 mmol/L — CL (ref 3.5–5.1)
Sodium: 148 mmol/L — ABNORMAL HIGH (ref 135–145)

## 2022-03-13 LAB — CBC
HCT: 32.8 % — ABNORMAL LOW (ref 36.0–46.0)
Hemoglobin: 9.3 g/dL — ABNORMAL LOW (ref 12.0–15.0)
MCH: 29.8 pg (ref 26.0–34.0)
MCHC: 28.4 g/dL — ABNORMAL LOW (ref 30.0–36.0)
MCV: 105.1 fL — ABNORMAL HIGH (ref 80.0–100.0)
Platelets: 218 10*3/uL (ref 150–400)
RBC: 3.12 MIL/uL — ABNORMAL LOW (ref 3.87–5.11)
RDW: 15.1 % (ref 11.5–15.5)
WBC: 8.5 10*3/uL (ref 4.0–10.5)
nRBC: 0 % (ref 0.0–0.2)

## 2022-03-13 MED ORDER — FUROSEMIDE 40 MG PO TABS
40.0000 mg | ORAL_TABLET | Freq: Every day | ORAL | Status: DC
  Administered 2022-03-13: 40 mg via ORAL
  Filled 2022-03-13: qty 1

## 2022-03-13 MED ORDER — AMOXICILLIN-POT CLAVULANATE 875-125 MG PO TABS
1.0000 | ORAL_TABLET | Freq: Two times a day (BID) | ORAL | Status: DC
  Administered 2022-03-13: 1 via ORAL
  Filled 2022-03-13: qty 1

## 2022-03-13 MED ORDER — POTASSIUM CHLORIDE CRYS ER 20 MEQ PO TBCR
40.0000 meq | EXTENDED_RELEASE_TABLET | Freq: Once | ORAL | Status: AC
  Administered 2022-03-13: 40 meq via ORAL
  Filled 2022-03-13: qty 2

## 2022-03-13 MED ORDER — AMOXICILLIN-POT CLAVULANATE 875-125 MG PO TABS: 1.0000 | ORAL_TABLET | Freq: Two times a day (BID) | ORAL | Status: AC

## 2022-03-13 MED ORDER — ALPRAZOLAM 0.25 MG PO TABS: 0.2500 mg | ORAL_TABLET | Freq: Every day | ORAL | 0 refills | Status: AC | PRN

## 2022-03-13 MED ORDER — IPRATROPIUM-ALBUTEROL 0.5-2.5 (3) MG/3ML IN SOLN: 3.0000 mL | Freq: Four times a day (QID) | RESPIRATORY_TRACT | 0 refills | Status: AC

## 2022-03-13 MED ORDER — FOOD THICKENER (SIMPLYTHICK): 1.0000 | ORAL | Status: AC | PRN

## 2022-03-13 NOTE — Care Management Important Message (Signed)
Important Message  Patient Details  Name: Robin Mcclure MRN: [id number] Date of Birth: [birth year]   Medicare Important Message Given:  Yes     [name] 03/13/2022, 12:51 PM

## 2022-03-13 NOTE — Discharge Summary (Signed)
Physician Discharge Summary   Patient: Robin Mcclure MRN: [id number] DOB: [birth year]  Admit date:     03/08/2022  Discharge date: 03/13/22  Discharge Physician: Shanon Brow Meygan Kyser   PCP: Lujean Amel, MD   Recommendations at discharge:   Please follow up with primary care provider within 1-2 weeks  Please repeat BMP and CBC in one week Maintain pt on 3L [place] and wean off for oxygen saturation >92%    Hospital Course: 88 year old female with history of diabetes mellitus type 2, COPD, hypertension, hyperlipidemia, GERD, and dysphagia presenting from [facility] secondary to altered level of consciousness and hypoxia.  The patient is unable to provide any significant history secondary to her extremis and altered mental status.  History is obtained from review of the medical record and speaking with the patient's daughter.  Notably, the patient was noted to have altered level consciousness when she was getting a nebulizer treatment at [facility] on 03/08/2022.  It was thought that the patient may have had a syncopal type episode when she was receiving a nebulizer treatment.  The patient had regained consciousness by the time EMS arrived, but she was noted to have oxygen saturation of 70%.  Chest x-ray earlier in the day showed right lower lobe opacity. Notably, the patient was recently hospitalized from 02/21/2022 to 03/05/2022 for small bowel obstruction versus ileus.  Her hospitalization was also complicated by aspiration pneumonia. In the ED, the patient had temperature up to 101.3 F.  She was hemodynamically stable.  She was placed on BiPAP with saturation 95-97%.  WBC 32.8, hemoglobin 9.0, platelets 261,000.  Sodium 133, potassium 3.4, bicarbonate 25, serum creatinine 1.03.  LFTs were unremarkable.  Troponin 120>> 114.  CTA chest was negative for PE but showed bilateral lower lobe consolidations with air bronchograms.  There are small bilateral pleural effusions.  There is a stable chronic T11 fracture.   The patient was initially started on cefepime.  This was changed to Zosyn.  The patient was placed on BiPAP and admitted to the stepdown unit.  Assessment and Plan: Sepsis -Present on admission -Presented with fever, leukocytosis, respiratory failure -Secondary to pneumonia -Continued IV fluids initially>>saline locked -Continue IV Zosyn>>d/c with amox/clav x 4 more days -blood cultures remain neg -PCT 1.44>>1.33   Aspiration pneumonia -MRSA screen--neg -Continue Zosyn -CTA chest negative for PE, shows bilateral lower lobe consolidation with air bronchograms -continue dys 1 diet with nectar thicken liquid   Acute respiratory failure with hypoxia -Secondary to pneumonia -10/22>>more hypoxic>>placed back on BiPAP -10/22 personally reviewed CXR>>increase bibasilar opacities?effusions -10/22>>lasix 40 mg IV x 1 -10/23>>weaned back to 5L>>3L -10/23>>redose lasix IV -plan to d/c back to SNF with lasix dose po was taking before admission -Wean as tolerated   Elevated troponin -troponin 120>>114 -due to demand ischemia in setting of sepsis   Diabetes mellitus type 2 -02/22/2022 hemoglobin A1c 6.4 -Holding Januvia and metformin -d/c ISS   Essential hypertension -Holding nifedipine secondary to soft blood pressure   COPD -Started DuoNebs>>continue   Hypernatremia -Started hypotonic fluid initially -Hold furosemide -change to D5W>>saline lock 10/22 due to fluid overload   Hyperlipidemia -Restart statin once able to tolerate p.o.   GOC -discussed with daughter>>confirms DNR -otherwise, continue full scope of care -daughter hopeful patient can stablize            Consultants: palliative Procedures performed: none  Disposition: Home Diet recommendation:  Carb modified diet DISCHARGE MEDICATION: Allergies as of 03/13/2022   No Known Allergies  Medication List     STOP taking these medications    glycopyrrolate 1 MG tablet Commonly known as:  ROBINUL   lactobacillus Pack   levofloxacin 750 MG tablet Commonly known as: LEVAQUIN   NIFEdipine 90 MG 24 hr tablet Commonly known as: ADALAT CC       TAKE these medications    acetaminophen 325 MG tablet Commonly known as: TYLENOL Take 650 mg by mouth every 8 (eight) hours as needed for mild pain or moderate pain.   albuterol 108 (90 Base) MCG/ACT inhaler Commonly known as: VENTOLIN HFA Inhale 1-2 puffs into the lungs every 6 (six) hours as needed for wheezing or shortness of breath.   ALPRAZolam 0.25 MG tablet Commonly known as: XANAX Take 1 tablet (0.25 mg total) by mouth daily as needed for anxiety.   amoxicillin-clavulanate 875-125 MG tablet Commonly known as: AUGMENTIN Take 1 tablet by mouth every 12 (twelve) hours. X 4 more days   ascorbic acid 500 MG tablet Commonly known as: VITAMIN C Take 500 mg by mouth 2 (two) times daily.   feeding supplement (PRO-STAT 64) Liqd Take 30 mLs by mouth in the morning.   Fluticasone-Salmeterol 500-50 MCG/DOSE Aepb Commonly known as: ADVAIR Inhale 1 puff into the lungs every 12 (twelve) hours.   food thickener Gel Commonly known as: SIMPLYTHICK (NECTAR/LEVEL 2/MILDLY THICK) Take 1 packet by mouth as needed.   furosemide 40 MG tablet Commonly known as: LASIX Take 40 mg by mouth in the morning.   guaiFENesin 600 MG 12 hr tablet Commonly known as: MUCINEX Take 1,200 mg by mouth 2 (two) times daily.   ipratropium-albuterol 0.5-2.5 (3) MG/3ML Soln Commonly known as: DUONEB Inhale 3 mLs into the lungs in the morning, at noon, in the evening, and at bedtime for 3 days.   Januvia 100 MG tablet Generic drug: sitaGLIPtin Take 100 mg by mouth in the morning.   metFORMIN 500 MG tablet Commonly known as: GLUCOPHAGE Take 500 mg by mouth daily.   MiraLax 17 GM/SCOOP powder Generic drug: polyethylene glycol powder Take 17 g by mouth daily.   multivitamin tablet Take 1 tablet by mouth daily.   ondansetron 4 MG  tablet Commonly known as: ZOFRAN Take 4 mg by mouth every 8 (eight) hours as needed for nausea.   pantoprazole 40 MG tablet Commonly known as: PROTONIX Take 40 mg by mouth daily.   Pataday 0.1 % ophthalmic solution Generic drug: olopatadine Place 1 drop into both eyes 2 (two) times daily.   Potassium Chloride ER 20 MEQ Tbcr Take 1 tablet by mouth daily.   pravastatin 20 MG tablet Commonly known as: PRAVACHOL Take 20 mg by mouth every morning.   ZINC-220 PO Take 1 capsule by mouth in the morning.        Discharge Exam: Filed Weights   03/11/22 0445 03/12/22 0500 03/13/22 0415  Weight: 62.7 kg 58.5 kg 62.8 kg   HEENT:  [place]/AT, No thrush, no icterus CV:  RRR, no rub, no S3, no S4 Lung:  bilateral rales. No wheeze Abd:  soft/+BS, NT Ext:  No edema, no lymphangitis, no synovitis, no rash   Condition at discharge: stable  The results of significant diagnostics from this hospitalization (including imaging, microbiology, ancillary and laboratory) are listed below for reference.   Imaging Studies: DG CHEST PORT 1 VIEW  Result Date: 03/11/2022 CLINICAL DATA:  Respiratory distress. EXAM: PORTABLE CHEST 1 VIEW COMPARISON:  March 08, 2022. FINDINGS: Stable cardiomegaly. Mildly increased bibasilar atelectasis or edema is  noted with associated pleural effusions. Bony thorax is unremarkable. IMPRESSION: Mildly increased bibasilar atelectasis or edema is noted with associated pleural effusion. Electronically Signed   By: Marijo Conception M.D.   On: 03/11/2022 14:36   CT Angio Chest PE W and/or Wo Contrast  Result Date: 03/08/2022 CLINICAL DATA:  Syncopal episode and low oxygen saturation. EXAM: CT ANGIOGRAPHY CHEST WITH CONTRAST TECHNIQUE: Multidetector CT imaging of the chest was performed using the standard protocol during bolus administration of intravenous contrast. Multiplanar CT image reconstructions and MIPs were obtained to evaluate the vascular anatomy. RADIATION DOSE  REDUCTION: This exam was performed according to the departmental dose-optimization program which includes automated exposure control, adjustment of the mA and/or kV according to patient size and/or use of iterative reconstruction technique. CONTRAST:  33m OMNIPAQUE IOHEXOL 350 MG/ML SOLN COMPARISON:  02/23/2022 FINDINGS: Cardiovascular: The heart is mildly enlarged but stable. Moderate tortuosity of the thoracic aorta but no focal aneurysm or dissection. Stable aortic and coronary artery calcifications. Stable marked enlargement of the pulmonary arteries suggesting pulmonary hypertension. No filling defects to suggest pulmonary embolism. Mediastinum/Nodes: No mediastinal or hilar mass or adenopathy. The esophagus is grossly. Lungs/Pleura: Small bilateral pleural effusions. Dense bilateral lower lobe airspace consolidation and air bronchograms likely pulmonary infiltrates. Aspiration would certainly be a consideration. There are also patchy infiltrates in both upper lobes. No pneumothorax. Upper Abdomen: No significant upper abdominal findings. Musculoskeletal: No breast masses, supraclavicular or axillary adenopathy. The bony thorax is stable. Stable severe thoracic kyphosis with midthoracic compression fractures and stable vertebral plana deformity at T11. Review of the MIP images confirms the above findings. IMPRESSION: 1. No CT findings for pulmonary embolism. 2. Stable tortuosity and calcification of the thoracic aorta but no focal aneurysm or dissection. 3. Stable marked enlargement of the pulmonary arteries suggesting pulmonary hypertension. 4. Dense bilateral lower lobe airspace consolidation and air bronchograms likely pulmonary infiltrates. Aspiration would certainly be a consideration. 5. Small bilateral pleural effusions. 6. Stable age advanced atherosclerotic calcifications involving the aorta and coronary arteries. 7. Stable thoracic compression fractures and vertebral plana deformity at T11. 8. Aortic  atherosclerosis. Aortic Atherosclerosis (ICD10-I70.0). Electronically Signed   By: PMarijo SanesM.D.   On: 03/08/2022 21:35   DG Chest Port 1 View  Result Date: 03/08/2022 CLINICAL DATA:  Shortness of breath. EXAM: PORTABLE CHEST 1 VIEW COMPARISON:  03/01/2022 FINDINGS: The heart is borderline enlarged but stable. The mediastinal and hilar contours are within normal limits and unchanged. Tortuosity and calcification of the thoracic aorta. Chronic underlying lung changes with areas of pulmonary scarring. Small bilateral pleural effusions noted with bibasilar atelectasis. No obvious infiltrates. IMPRESSION: Chronic underlying lung disease with small bilateral pleural effusions and bibasilar atelectasis. Electronically Signed   By: PMarijo SanesM.D.   On: 03/08/2022 18:02   UKoreaCHEST (PLEURAL EFFUSION)  Result Date: 03/01/2022 CLINICAL DATA:  LEFT pleural effusion EXAM: CHEST ULTRASOUND COMPARISON:  Chest radiograph 03/01/2022 FINDINGS: Small LEFT pleural effusion is identified. Based on patient age/size and condition, this is insufficient for thoracentesis. Atelectasis in LEFT lower lobe. IMPRESSION: Small LEFT pleural effusion insufficient for thoracentesis. Electronically Signed   By: MLavonia DanaM.D.   On: 03/01/2022 12:12   DG CHEST PORT 1 VIEW  Result Date: 03/01/2022 CLINICAL DATA:  Shortness of breath. EXAM: PORTABLE CHEST 1 VIEW COMPARISON:  02/27/2022 FINDINGS: Stable cardiomediastinal contours. Bilateral pleural effusions are noted, left greater than right. Persistent complete retrocardiac opacification of the left lung base. Again noted are patchy bilateral upper  lobe and right lung base opacities. These are unchanged compared with the previous exam. IMPRESSION: 1. No change in aeration to the lungs compared with previous exam. 2. Persistent bilateral pleural effusions, left greater than right. Electronically Signed   By: Kerby Moors M.D.   On: 03/01/2022 08:22   DG ABD ACUTE 2+V W 1V  CHEST  Result Date: 02/28/2022 CLINICAL DATA:  Shortness of breath. EXAM: DG ABDOMEN ACUTE WITH 1 VIEW CHEST COMPARISON:  Radiographs 02/27/2022 FINDINGS: Progressive pulmonary infiltrates and probable small bilateral pleural effusions. Contrast noted throughout the colon from a recent swallowing function test. No findings for small bowel obstruction or free air. IMPRESSION: 1. Progressive pulmonary infiltrates and probable small bilateral pleural effusions. 2. No acute abdominal findings. Electronically Signed   By: Marijo Sanes M.D.   On: 02/28/2022 14:36   DG SWALLOW FUNC SPEECH PATH  Result Date: 02/27/2022 Table formatting from the original result was not included. Objective Swallowing Evaluation: Type of Study: MBS-Modified Barium Swallow Study  Patient Details Name: Robin Mcclure MRN: [id number] Date of Birth: 11-Apr-1923 Today's Date: 02/27/2022 Time: SLP Start Time (ACUTE ONLY): 1400 -SLP Stop Time (ACUTE ONLY): 4431 SLP Time Calculation (min) (ACUTE ONLY): 33 min Past Medical History: Past Medical History: Diagnosis Date  Diabetes mellitus without complication ([place])   Hyperlipidemia   Hypertension  Past Surgical History: Past Surgical History: Procedure Laterality Date  ABDOMINAL HYSTERECTOMY    BIOPSY  01/19/2018  Procedure: BIOPSY;  Surgeon: Ronnette Juniper, MD;  Location: [facility] ENDOSCOPY;  Service: Gastroenterology;;  CHOLECYSTECTOMY    ESOPHAGOGASTRODUODENOSCOPY (EGD) WITH PROPOFOL N/A 01/19/2018  Procedure: ESOPHAGOGASTRODUODENOSCOPY (EGD) WITH PROPOFOL;  Surgeon: Ronnette Juniper, MD;  Location: [place];  Service: Gastroenterology;  Laterality: N/A;  HIP SURGERY   HPI: 88 y.o. female with medical history significant of COPD, GERD, hypertension, hyperlipidemia, T2DM admitted on 02/21/2022 with intractable emesis and found to have partial small bowel obstruction  Versus ileus. in the early hours of 02/24/2022 patient developed significant respiratory distress with hypoxia O2 sats down to the 60s  -Required  deep suctioning, bronchodilators, high flow oxygen  Arterial Blood Gas result:  pO2 44; pCO2 48; pH 7.3;  HCO3 23.6, %O2 Sat 76.8 on 8 L of [place]  -Currently requiring up to 11 L of oxygen via nasal cannula. BSE requested.  Subjective: "I am getting so tired."  Recommendations for follow up therapy are one component of a multi-disciplinary discharge planning process, led by the attending physician.  Recommendations may be updated based on patient status, additional functional criteria and insurance authorization. Assessment / Plan / Recommendation   02/27/2022   4:00 PM Clinical Impressions Clinical Impression Pt presents with mi/mod oropharyngeal and suspected primary esophageal dysphagia characterized by weak lingual manipulation and prolonged oral transit, swallow trigger at the level of the valleculae, decreased tongue base retraction and epiglottic deflection which appeared to be negatively impacted by elongated uvulae that nearly meat the tip of the epiglottis and impaired epiglottic deflection and approximation with posterior pharyngeal wall resulting in reduced laryngeal vestibule closure and penetration of thins during the swallow and penetration/aspiration to the vocal folds (difficult to see if below vocal folds at times) in trace amounts and resulting vallecular residue and occasional contrast behind the uvulae. Epiglottis deflected greater with puree. Nectars only spilled over into the laryngeal vestibule in trace amounts, but also resulted in vallecular residue. Esophageal sweep revealed extensive barium contrast diffusely throughout the esophagus that resulted in some backflow/retrograde movement to the cervical esophagus. Pt still currently  requires high supplemental oxygen and fatigues quickly, which increases risk for aspiration and decreases her ability to meet nutritional needs. Pt's daughter indicates that Pt was on nectar thickened liquids about 1-2 years ago and "did not like it". SLP reviewed  imaging with Pt's daughter and suggested that we change her to NTL for now, with plan to advance her once she is clinically stronger. Daughter is willing to try this. Question whether Pt's appearance of elongated uvula could be due to some swelling from recent NG placement? Recommend D1/puree and NTL via cup/straw with pacing strategies due to shortness of breath and fatigue and ensure that Pt is sitting upright for all eating/drinking and remains up for at least 30 minutes after meals due to esophageal dsymotility. SLP will follow during acute stay. SLP Visit Diagnosis Dysphagia, pharyngoesophageal phase (R13.14);Dysphagia, oropharyngeal phase (R13.12) Impact on safety and function Mild aspiration risk;Risk for inadequate nutrition/hydration     02/27/2022   4:00 PM Treatment Recommendations Treatment Recommendations Therapy as outlined in treatment plan below     02/27/2022   4:00 PM Prognosis Prognosis for Safe Diet Advancement Good   02/27/2022   4:00 PM Diet Recommendations SLP Diet Recommendations Dysphagia 1 (Puree) solids;Nectar thick liquid Liquid Administration via Cup;Straw Medication Administration Whole meds with puree Compensations Slow rate;Small sips/bites;Multiple dry swallows after each bite/sip Postural Changes Remain semi-upright after after feeds/meals (Comment);Seated upright at 90 degrees     02/27/2022   4:00 PM Other Recommendations Recommended Consults Consider esophageal assessment Oral Care Recommendations Oral care BID;Staff/trained caregiver to provide oral care Other Recommendations Clarify dietary restrictions;Order thickener from pharmacy;Prohibited food (jello, ice cream, thin soups) Follow Up Recommendations Skilled nursing-short term rehab (<3 hours/day) Assistance recommended at discharge Frequent or constant Supervision/Assistance Functional Status Assessment Patient has had a recent decline in their functional status and demonstrates the ability to make significant improvements  in function in a reasonable and predictable amount of time.   02/27/2022   4:00 PM Frequency and Duration  Speech Therapy Frequency (ACUTE ONLY) min 2x/week Treatment Duration 1 week     02/27/2022   4:00 PM Oral Phase Oral Phase Impaired Oral - Mech Soft Weak lingual manipulation;Delayed oral transit;Piecemeal swallowing    02/27/2022   4:00 PM Pharyngeal Phase Pharyngeal Phase Impaired Pharyngeal- Nectar Straw Delayed swallow initiation-vallecula;Reduced epiglottic inversion;Penetration/Aspiration during swallow;Pharyngeal residue - valleculae Pharyngeal Material enters airway, remains ABOVE vocal cords then ejected out Pharyngeal- Thin Teaspoon Delayed swallow initiation-vallecula;Reduced epiglottic inversion;Reduced tongue base retraction;Penetration/Aspiration during swallow;Penetration/Apiration after swallow;Pharyngeal residue - valleculae Pharyngeal Material enters airway, CONTACTS cords and not ejected out;Material enters airway, remains ABOVE vocal cords and not ejected out Pharyngeal- Thin Cup Delayed swallow initiation-vallecula;Reduced epiglottic inversion;Reduced airway/laryngeal closure;Reduced tongue base retraction;Penetration/Aspiration during swallow;Penetration/Apiration after swallow;Trace aspiration;Pharyngeal residue - valleculae Pharyngeal Material enters airway, CONTACTS cords and not ejected out Pharyngeal- Thin Straw Delayed swallow initiation-vallecula;Delayed swallow initiation-pyriform sinuses;Reduced tongue base retraction;Reduced epiglottic inversion;Penetration/Aspiration during swallow;Pharyngeal residue - valleculae Pharyngeal Material enters airway, remains ABOVE vocal cords and not ejected out Pharyngeal- Puree Delayed swallow initiation-vallecula;Reduced tongue base retraction;Reduced epiglottic inversion;Pharyngeal residue - valleculae Pharyngeal- Mechanical Soft Reduced epiglottic inversion;Reduced tongue base retraction;Delayed swallow initiation-vallecula;Pharyngeal residue  - valleculae Pharyngeal- Pill WFL;Delayed swallow initiation-vallecula    02/27/2022   4:00 PM Cervical Esophageal Phase  Cervical Esophageal Phase Impaired Puree Esophageal backflow into cervical esophagus Thank you, Genene Churn, [place] 02/27/2022, 5:27 PM                     DG  CHEST PORT 1 VIEW  Result Date: 02/27/2022 CLINICAL DATA:  Aspiration into airway. EXAM: PORTABLE CHEST 1 VIEW COMPARISON:  02/26/2022 and 02/24/2022 FINDINGS: Increased focal density along the periphery of the right upper lung. Slightly increased airspace densities in the medial aspect of the right upper lobe. Again noted are densities at the left lung base and the left hemidiaphragm is obscured. Findings could represent a combination of pleural fluid and consolidation at the left lung base. Heart size is stable. Trachea is midline. Negative for a pneumothorax. Nasogastric tube has been removed. Surgical clips in the right upper abdomen. IMPRESSION: 1. Increased airspace densities in the right upper lobe. Findings are concerning for pneumonia. 2. Left basilar densities could represent a combination of pleural fluid and airspace disease/consolidation. Electronically Signed   By: Markus Daft M.D.   On: 02/27/2022 08:58   DG ABD ACUTE 2+V W 1V CHEST  Result Date: 02/26/2022 CLINICAL DATA:  Aspiration.  History of small-bowel obstruction. EXAM: DG ABDOMEN ACUTE WITH 1 VIEW CHEST COMPARISON:  Abdominal radiograph 02/25/2022 FINDINGS: Enteric tube appears coiled within the stomach. Stable cardiomegaly and hilar prominence. Similar-appearing left-greater-than-right basilar airspace opacities which may represent atelectasis. Possible trace bilateral pleural effusions. No pneumothorax. Thoracic spine degenerative changes. Oral contrast material within the descending colon. There are a few gaseous distended loops of small bowel within the central abdomen. Surgical clips right upper quadrant. Right hip  arthroplasty. Lumbar spine degenerative changes. IMPRESSION: 1. A few gaseous distended loops of small bowel within the central abdomen, similar to prior. 2. Bibasilar atelectasis. 3. Oral contrast material within the descending colon. 4. Enteric tube appears coiled within the stomach. Electronically Signed   By: Lovey Newcomer M.D.   On: 02/26/2022 08:08   DG ABD ACUTE 2+V W 1V CHEST  Result Date: 02/25/2022 CLINICAL DATA:  Respiratory failure with hypoxia. Bowel obstruction. EXAM: DG ABDOMEN ACUTE WITH 1 VIEW CHEST COMPARISON:  02/24/2022 FINDINGS: Orogastric tube is seen with tip in the distal antrum or duodenal bulb. Oral contrast material is now seen within nondilated left colon. Decreased small bowel dilatation is seen since prior exam. Contrast noted in the urinary bladder from recent CT. Cardiomegaly is stable. Enlarged pulmonary arteries are consistent with pulmonary arterial hypertension. Increased opacity in both lung bases may be due to atelectasis or infiltrate. No evidence of pleural effusion or pneumothorax. IMPRESSION: Decreased small bowel dilatation since prior exam. Oral contrast material now seen in nondilated left colon. Increased bibasilar atelectasis versus infiltrate. Stable cardiomegaly and pulmonary arterial hypertension. Electronically Signed   By: Marlaine Hind M.D.   On: 02/25/2022 09:16   DG ABD ACUTE 2+V W 1V CHEST  Result Date: 02/24/2022 CLINICAL DATA:  88 year old female with resolving small bowel obstruction on CT yesterday, oral contrast reached the colon. EXAM: DG ABDOMEN ACUTE WITH 1 VIEW CHEST COMPARISON:  CT Chest, Abdomen, and Pelvis yesterday and earlier. FINDINGS: Portable upright AP view of the chest at 0719 hours. Enteric tube terminates in the distal stomach or duodenum, side hole at the level of the gastric body. Stable cholecystectomy clips. No pneumoperitoneum or pneumothorax. Stable lung bases and mediastinum. Portable upright and supine views the abdomen at 0719  hours. Retained oral contrast appears to be largely within the colon now from the transverse to the sigmoid. Excreted IV contrast in the urinary bladder. Superimposed gas-filled small bowel loops up to 3.5 cm diameter are not significantly changed from the CT yesterday. Right hip arthroplasty.  No acute osseous abnormality identified. IMPRESSION: 1.  Stable bowel gas pattern from the CT yesterday. Retained oral contrast now primarily in the colon. No free air. 2. Stable chest. Enteric tube terminates in the distal stomach or duodenum. Electronically Signed   By: Genevie Ann M.D.   On: 02/24/2022 09:21   CT CHEST W CONTRAST  Result Date: 02/23/2022 CLINICAL DATA:  Intractable emesis, partial small bowel obstruction versus ileus, abnormal density right upper chest EXAM: CT CHEST, ABDOMEN, AND PELVIS WITH CONTRAST TECHNIQUE: Multidetector CT imaging of the chest, abdomen and pelvis was performed following the standard protocol during bolus administration of intravenous contrast. RADIATION DOSE REDUCTION: This exam was performed according to the departmental dose-optimization program which includes automated exposure control, adjustment of the mA and/or kV according to patient size and/or use of iterative reconstruction technique. CONTRAST:  58m OMNIPAQUE IOHEXOL 300 MG/ML  SOLN COMPARISON:  02/23/2022, 02/21/2022 FINDINGS: CT CHEST FINDINGS Cardiovascular: Heart is enlarged without pericardial effusion. Extensive atherosclerosis of the coronary vasculature unchanged. No evidence of thoracic aortic aneurysm or dissection. Stable aortic atherosclerosis. Dilated main pulmonary trunk consistent with pulmonary arterial hypertension. Mediastinum/Nodes: Enteric catheter extends into the gastric lumen, tip in the region of the duodenal bulb. Thyroid and trachea are unremarkable. No pathologic adenopathy. Lungs/Pleura: No airspace disease, effusion, or pneumothorax. Density in the right upper chest on preceding x-ray likely  reflect a superimposed vascular shadow given patient rotation. Central airways are patent. Musculoskeletal: There are no acute displaced fractures. Chronic compression deformities are seen at T4, T5, and T11. Reconstructed images demonstrate no additional findings. CT ABDOMEN PELVIS FINDINGS Hepatobiliary: No focal liver abnormality is seen. Status post cholecystectomy. No biliary dilatation. Pancreas: Pancreas is atrophic. No focal abnormalities or pancreatic duct dilation. Spleen: Normal in size without focal abnormality. Adrenals/Urinary Tract: Kidneys are unremarkable without urinary tract calculi or obstructive uropathy. The adrenals and bladder are grossly unremarkable. Evaluation of the bladder slightly limited due to streak artifact from right hip arthroplasty. Stomach/Bowel: There has been transit of oral contrast into the colon by the time of imaging, excluding high-grade obstruction. There are some residual dilated loops of small bowel within the left mid abdomen, which may be due to resolving obstruction or ileus. No bowel wall thickening or inflammatory change. Vascular/Lymphatic: Aortic atherosclerosis. No enlarged abdominal or pelvic lymph nodes. Reproductive: Status post hysterectomy. No adnexal masses. Other: No free fluid or free intraperitoneal gas. No abdominal wall hernia. Musculoskeletal: There are no acute or destructive bony lesions. Right hip arthroplasty is unremarkable. Reconstructed images demonstrate no additional findings. IMPRESSION: 1. Resolving small-bowel obstruction or ileus, with transit of oral contrast into the colon at the time of imaging. Minimal residual small bowel dilatation in the left mid abdomen. 2. No acute intrathoracic process. The density seen in the right upper chest on preceding x-ray is consistent with superimposed vascular shadows given patient rotation on that exam. 3. Cardiomegaly. 4. Aortic Atherosclerosis (ICD10-I70.0). Coronary artery atherosclerosis.  Electronically Signed   By: MRanda NgoM.D.   On: 02/23/2022 16:45   CT ABDOMEN PELVIS W CONTRAST  Result Date: 02/23/2022 CLINICAL DATA:  Intractable emesis, partial small bowel obstruction versus ileus, abnormal density right upper chest EXAM: CT CHEST, ABDOMEN, AND PELVIS WITH CONTRAST TECHNIQUE: Multidetector CT imaging of the chest, abdomen and pelvis was performed following the standard protocol during bolus administration of intravenous contrast. RADIATION DOSE REDUCTION: This exam was performed according to the departmental dose-optimization program which includes automated exposure control, adjustment of the mA and/or kV according to patient size and/or use of iterative  reconstruction technique. CONTRAST:  14m OMNIPAQUE IOHEXOL 300 MG/ML  SOLN COMPARISON:  02/23/2022, 02/21/2022 FINDINGS: CT CHEST FINDINGS Cardiovascular: Heart is enlarged without pericardial effusion. Extensive atherosclerosis of the coronary vasculature unchanged. No evidence of thoracic aortic aneurysm or dissection. Stable aortic atherosclerosis. Dilated main pulmonary trunk consistent with pulmonary arterial hypertension. Mediastinum/Nodes: Enteric catheter extends into the gastric lumen, tip in the region of the duodenal bulb. Thyroid and trachea are unremarkable. No pathologic adenopathy. Lungs/Pleura: No airspace disease, effusion, or pneumothorax. Density in the right upper chest on preceding x-ray likely reflect a superimposed vascular shadow given patient rotation. Central airways are patent. Musculoskeletal: There are no acute displaced fractures. Chronic compression deformities are seen at T4, T5, and T11. Reconstructed images demonstrate no additional findings. CT ABDOMEN PELVIS FINDINGS Hepatobiliary: No focal liver abnormality is seen. Status post cholecystectomy. No biliary dilatation. Pancreas: Pancreas is atrophic. No focal abnormalities or pancreatic duct dilation. Spleen: Normal in size without focal  abnormality. Adrenals/Urinary Tract: Kidneys are unremarkable without urinary tract calculi or obstructive uropathy. The adrenals and bladder are grossly unremarkable. Evaluation of the bladder slightly limited due to streak artifact from right hip arthroplasty. Stomach/Bowel: There has been transit of oral contrast into the colon by the time of imaging, excluding high-grade obstruction. There are some residual dilated loops of small bowel within the left mid abdomen, which may be due to resolving obstruction or ileus. No bowel wall thickening or inflammatory change. Vascular/Lymphatic: Aortic atherosclerosis. No enlarged abdominal or pelvic lymph nodes. Reproductive: Status post hysterectomy. No adnexal masses. Other: No free fluid or free intraperitoneal gas. No abdominal wall hernia. Musculoskeletal: There are no acute or destructive bony lesions. Right hip arthroplasty is unremarkable. Reconstructed images demonstrate no additional findings. IMPRESSION: 1. Resolving small-bowel obstruction or ileus, with transit of oral contrast into the colon at the time of imaging. Minimal residual small bowel dilatation in the left mid abdomen. 2. No acute intrathoracic process. The density seen in the right upper chest on preceding x-ray is consistent with superimposed vascular shadows given patient rotation on that exam. 3. Cardiomegaly. 4. Aortic Atherosclerosis (ICD10-I70.0). Coronary artery atherosclerosis. Electronically Signed   By: MRanda NgoM.D.   On: 02/23/2022 16:45   DG ABD ACUTE 2+V W 1V CHEST  Result Date: 02/23/2022 CLINICAL DATA:  Small-bowel obstruction. EXAM: DG ABDOMEN ACUTE WITH 1 VIEW CHEST COMPARISON:  February 21, 2022 FINDINGS: Enteric tube courses into the abdomen. Tip likely in the area of the proximal duodenum. Trachea midline. Cardiomediastinal contours and hilar structures are stable with signs of cardiomegaly. No consolidation. Subtle added density over the RIGHT upper chest. This is about  the third and fourth ribs posteriorly and the clavicle, an area of significant overlap but is asymmetric to the other side. No pneumothorax. No free air beneath either the RIGHT or LEFT hemidiaphragm. Scattered loops of gas-filled bowel throughout the abdomen. Degree of distension slightly improved compared to previous imaging still with some mild to moderate distension of bowel loops in the LEFT mid abdomen. Post partial colonic resection as before. Stool and gas scattered throughout the colon with gas in the rectum. Degenerative changes throughout the spine. RIGHT hip arthroplasty. Osteopenia. IMPRESSION: 1. Enteric tube courses into the abdomen. Tip likely in the area of the proximal duodenum. 2. Scattered loops of gas-filled bowel throughout the abdomen. Degree of distension slightly improved compared to previous imaging still with some mild to moderate distension of bowel loops in the LEFT mid abdomen. 3. No free air. 4. Added density  over the RIGHT upper chest, image slightly rotated and could project vascular structures over the upper chest. Density is however more than expected just above the RIGHT hilum and underlying nodule up to 2.3 cm is considered. Given nonstandard positioning currently would suggest CT of the chest for further evaluation on follow-up. Electronically Signed   By: Zetta Bills M.D.   On: 02/23/2022 08:12   US Venous Img Lower Unilateral Right (DVT)  Result Date: 02/22/2022 CLINICAL DATA:  Right leg swelling EXAM: RIGHT LOWER EXTREMITY VENOUS DOPPLER ULTRASOUND TECHNIQUE: Gray-scale sonography with compression, as well as color and duplex ultrasound, were performed to evaluate the deep venous system(s) from the level of the common femoral vein through the popliteal and proximal calf veins. COMPARISON:  None available FINDINGS: VENOUS Normal compressibility of the common femoral, superficial femoral, and popliteal veins, as well as the visualized calf veins. Visualized portions of  profunda femoral vein and great saphenous vein unremarkable. No filling defects to suggest DVT on grayscale or color Doppler imaging. Doppler waveforms show normal direction of venous flow, normal respiratory plasticity and response to augmentation. Limited views of the contralateral common femoral vein are unremarkable. OTHER None. Limitations: none IMPRESSION: No right lower extremity DVT. Electronically Signed   By: Miachel Roux M.D.   On: 02/22/2022 13:22   DG Chest Portable 1 View  Result Date: 02/21/2022 CLINICAL DATA:  Enteric catheter placement EXAM: PORTABLE CHEST 1 VIEW COMPARISON:  08/18/2020 FINDINGS: Single frontal view of the chest demonstrates enteric catheter passing below diaphragm, tip excluded by collimation. Side port projects over the gastric fundus. Cardiac silhouette is enlarged but stable. No airspace disease, effusion, or pneumothorax. No acute bony abnormality. IMPRESSION: 1. Enteric catheter coiled over the stomach, tip excluded by collimation. The side port projects over the gastric fundus. Electronically Signed   By: Randa Ngo M.D.   On: 02/21/2022 20:01   DG Abd Portable 1 View  Result Date: 02/21/2022 CLINICAL DATA:  Enteric catheter placement EXAM: PORTABLE ABDOMEN - 1 VIEW COMPARISON:  02/21/2022 FINDINGS: Frontal view of the lower chest and upper abdomen demonstrates enteric catheter passing below diaphragm, looped back upon itself with tip and side port extending retrograde in the region of the distal thoracic esophagus. Recommend removal and replacement. Lung bases are clear. Gaseous distention of the small bowel consistent with obstruction as seen on recent CT. IMPRESSION: 1. Enteric catheter as above, coiled back upon itself with tip projecting over the distal thoracic esophagus. Recommend removal and replacement. 2. Small bowel obstruction. Electronically Signed   By: Randa Ngo M.D.   On: 02/21/2022 18:34   CT Abdomen Pelvis W Contrast  Result Date:  02/21/2022 CLINICAL DATA:  Difficulty urinating. EXAM: CT ABDOMEN AND PELVIS WITH CONTRAST TECHNIQUE: Multidetector CT imaging of the abdomen and pelvis was performed using the standard protocol following bolus administration of intravenous contrast. RADIATION DOSE REDUCTION: This exam was performed according to the departmental dose-optimization program which includes automated exposure control, adjustment of the mA and/or kV according to patient size and/or use of iterative reconstruction technique. CONTRAST:  146m OMNIPAQUE IOHEXOL 300 MG/ML  SOLN COMPARISON:  None Available. FINDINGS: Lower chest: No acute abnormality. Hepatobiliary: No focal liver abnormality is seen. Status post cholecystectomy. No biliary dilatation. Pancreas: Unremarkable. No pancreatic ductal dilatation or surrounding inflammatory changes. Spleen: Normal in size without focal abnormality. Adrenals/Urinary Tract: Adrenal glands are unremarkable. Kidneys are normal in size with mild areas of renal cortical thinning noted. There is no evidence of renal calculi  or hydronephrosis. Bladder is unremarkable. Stomach/Bowel: Stomach is within normal limits. The appendix is not identified. Multiple dilated small bowel loops are seen throughout the abdomen and pelvis (maximum small bowel diameter of approximately 3.8 cm). A gradual transition zone is seen within the lateral aspect of the mid to lower right abdomen. Numerous noninflamed diverticula are seen throughout the sigmoid colon. Vascular/Lymphatic: Aortic atherosclerosis. No enlarged abdominal or pelvic lymph nodes. Reproductive: Status post hysterectomy. No adnexal masses. Other: No abdominal wall hernia or abnormality. No abdominopelvic ascites. Musculoskeletal: A total right hip replacement is seen with a marked amount of associated streak artifact. Subsequently limited evaluation of the adjacent osseous and soft tissue structures is noted. A chronic compression fracture deformity is seen at  the level of T11. Multilevel degenerative changes are seen throughout the remainder of the lumbar spine. IMPRESSION: 1. Findings consistent with a partial small bowel obstruction versus ileus. 2. Sigmoid diverticulosis. 3. Total right hip replacement. 4. Chronic compression fracture deformity at the level of T11. 5. Aortic atherosclerosis. Aortic Atherosclerosis (ICD10-I70.0). Electronically Signed   By: Virgina Norfolk M.D.   On: 02/21/2022 17:12    Microbiology: Results for orders placed or performed during the hospital encounter of 03/08/22  SARS Coronavirus 2 by RT PCR (hospital order, performed in [facility] hospital lab) *cepheid single result test* Anterior Nasal Swab     Status: None   Collection Time: 03/08/22  5:31 PM   Specimen: Anterior Nasal Swab  Result Value Ref Range Status   SARS Coronavirus 2 by RT PCR NEGATIVE NEGATIVE Final    Comment: (NOTE) SARS-CoV-2 target nucleic acids are NOT DETECTED.  The SARS-CoV-2 RNA is generally detectable in upper and lower respiratory specimens during the acute phase of infection. The lowest concentration of SARS-CoV-2 viral copies this assay can detect is 250 copies / mL. A negative result does not preclude SARS-CoV-2 infection and should not be used as the sole basis for treatment or other patient management decisions.  A negative result may occur with improper specimen collection / handling, submission of specimen other than nasopharyngeal swab, presence of viral mutation(s) within the areas targeted by this assay, and inadequate number of viral copies (<250 copies / mL). A negative result must be combined with clinical observations, patient history, and epidemiological information.  Fact Sheet for Patients:   https://www.patel.info/  Fact Sheet for Healthcare Providers: https://hall.com/  This test is not yet approved or  cleared by the [place] FDA and has been authorized for  detection and/or diagnosis of SARS-CoV-2 by FDA under an Emergency Use Authorization (EUA).  This EUA will remain in effect (meaning this test can be used) for the duration of the COVID-19 declaration under Section 564(b)(1) of the Act, 21 U.S.C. section 360bbb-3(b)(1), unless the authorization is terminated or revoked sooner.  Performed at [facility], [address] [place], [place] [address]   Blood culture (routine x 2)     Status: None   Collection Time: 03/08/22  5:31 PM   Specimen: BLOOD  Result Value Ref Range Status   Specimen Description BLOOD BLOOD LEFT ARM  Final   Special Requests BLOOD  Final   Culture   Final    NO GROWTH 5 DAYS Performed at [facility], [address], [place], [place] [address]    Report Status 03/13/2022 FINAL  Final  Blood culture (routine x 2)     Status: None   Collection Time: 03/08/22  5:40 PM   Specimen: BLOOD  Result Value  Ref Range Status   Specimen Description BLOOD LEFT ANTECUBITAL  Final   Special Requests   Final    BOTTLES DRAWN AEROBIC AND ANAEROBIC Blood Culture adequate volume   Culture   Final    NO GROWTH 5 DAYS Performed at [facility], [address] [place], [place] [address]    Report Status 03/13/2022 FINAL  Final  MRSA Next Gen by PCR, Nasal     Status: None   Collection Time: 03/09/22  8:20 AM   Specimen: Nasal Mucosa; Nasal Swab  Result Value Ref Range Status   MRSA by PCR Next Gen NOT DETECTED NOT DETECTED Final    Comment: (NOTE) The GeneXpert MRSA Assay (FDA approved for NASAL specimens only), is one component of a comprehensive MRSA colonization surveillance program. It is not intended to diagnose MRSA infection nor to guide or monitor treatment for MRSA infections. Test performance is not FDA approved in patients less than 25 years old. Performed at [facility], [address], [place], [place] [address]     Labs: CBC: Recent Labs  Lab 03/08/22 1731 03/09/22 0553 03/10/22 0431 03/11/22 0347  03/12/22 0356 03/13/22 0359  WBC 32.8* 28.5* 22.5* 14.6* 10.7* 8.5  NEUTROABS 29.8*  --   --   --   --   --   HGB 11.0* 10.0* 8.7* 8.2* 8.4* 9.3*  HCT 35.3* 33.2* 28.2* 26.9* 28.4* 32.8*  MCV 99.2 101.8* 100.0 100.4* 100.7* 105.1*  PLT 261 241 223 209 226 403   Basic Metabolic Panel: Recent Labs  Lab 03/09/22 0553 03/10/22 0431 03/11/22 0347 03/12/22 0356 03/13/22 0359  NA 153* 154* 148* 145 148*  K 3.4* 3.8 4.2 3.5 2.7*  CL 117* 122* 119* 112* 113*  CO2 '25 23 23 25 26  '$ GLUCOSE 230* 175* 192* 132* 121*  BUN 29* 25* '19 17 16  '$ CREATININE 1.03* 0.85 0.82 0.71 0.75  CALCIUM 8.9 8.6* 8.3* 8.4* 8.7*  MG  --  2.1 1.9  --   --   PHOS  --   --  1.7*  --   --    Liver Function Tests: Recent Labs  Lab 03/08/22 1731 03/09/22 0553  AST 14* 10*  ALT 14 11  ALKPHOS 76 66  BILITOT 0.7 0.5  PROT 7.0 5.9*  ALBUMIN 2.5* 2.1*   CBG: Recent Labs  Lab 03/12/22 2001 03/12/22 2325 03/13/22 0417 03/13/22 0736 03/13/22 1108  GLUCAP 123* 110* 112* 124* 157*    Discharge time spent: greater than 30 minutes.  Signed: Orson Eva, MD [facility] 03/13/2022

## 2022-03-13 NOTE — Evaluation (Signed)
Clinical/Bedside Swallow Evaluation Patient Details  Name: Robin Mcclure MRN: [id number] Date of Birth: 08-03-1922  Today's Date: 03/13/2022 Time: SLP Start Time (ACUTE ONLY): 75 SLP Stop Time (ACUTE ONLY): 1339 SLP Time Calculation (min) (ACUTE ONLY): 19 min  Past Medical History:  Past Medical History:  Diagnosis Date   COPD (chronic obstructive pulmonary disease) ([place])    Diabetes mellitus without complication ([place])    Dysphagia    GERD (gastroesophageal reflux disease)    Hyperlipidemia    Hypertension    Past Surgical History:  Past Surgical History:  Procedure Laterality Date   ABDOMINAL HYSTERECTOMY     BIOPSY  01/19/2018   Procedure: BIOPSY;  Surgeon: Ronnette Juniper, MD;  Location: [place];  Service: Gastroenterology;;   CHOLECYSTECTOMY     ESOPHAGOGASTRODUODENOSCOPY (EGD) WITH PROPOFOL N/A 01/19/2018   Procedure: ESOPHAGOGASTRODUODENOSCOPY (EGD) WITH PROPOFOL;  Surgeon: Ronnette Juniper, MD;  Location: [place];  Service: Gastroenterology;  Laterality: N/A;   HIP SURGERY     HPI:  88 year old female with history of diabetes mellitus type 2, COPD, hypertension, hyperlipidemia, GERD, and dysphagia presenting from [facility] secondary to altered level of consciousness and hypoxia.  The patient is unable to provide any significant history secondary to her extremis and altered mental status.  History is obtained from review of the medical record and speaking with the patient's daughter.  Notably, the patient was noted to have altered level consciousness when she was getting a nebulizer treatment at [facility] on 03/08/2022.  It was thought that the patient may have had a syncopal type episode when she was receiving a nebulizer treatment.  The patient had regained consciousness by the time EMS arrived, but she was noted to have oxygen saturation of 70%.  Chest x-ray earlier in the day showed right lower lobe opacity.  Notably, the patient was recently hospitalized from 02/21/2022 to  03/05/2022 for small bowel obstruction versus ileus.  Her hospitalization was also complicated by aspiration pneumonia.  In the ED, the patient had temperature up to 101.3 F.  She was hemodynamically stable.  She was placed on BiPAP with saturation 95-97%.  WBC 32.8, hemoglobin 9.0, platelets 261,000.  Sodium 133, potassium 3.4, bicarbonate 25, serum creatinine 1.03.  LFTs were unremarkable.  Troponin 120>> 114.  CTA chest was negative for PE but showed bilateral lower lobe consolidations with air bronchograms.  There are small bilateral pleural effusions.  There is a stable chronic T11 fracture.  The patient was initially started on cefepime.  This was changed to Zosyn.  The patient was placed on BiPAP and admitted to the stepdown unit. BSE requested. Pt had MBSS during recent admission. See results below   MBSS from 02/27/2022   <<Pt presents with mi/mod oropharyngeal and suspected primary esophageal dysphagia characterized by weak lingual manipulation and prolonged oral transit, swallow trigger at the level of the valleculae, decreased tongue base retraction and epiglottic deflection which appeared to be negatively impacted by elongated uvulae that nearly meat the tip of the epiglottis and impaired epiglottic deflection and approximation with posterior pharyngeal wall resulting in reduced laryngeal vestibule closure and penetration of thins during the swallow and penetration/aspiration to the vocal folds (difficult to see if below vocal folds at times) in trace amounts and resulting vallecular residue and occasional contrast behind the uvulae. Epiglottis deflected greater with puree. Nectars only spilled over into the laryngeal vestibule in trace amounts, but also resulted in vallecular residue. Esophageal sweep revealed extensive barium contrast diffusely throughout the esophagus that resulted  in some backflow/retrograde movement to the cervical esophagus. Pt still currently requires high supplemental oxygen  and fatigues quickly, which increases risk for aspiration and decreases her ability to meet nutritional needs. Pt's daughter indicates that Pt was on nectar thickened liquids about 1-2 years ago and "did not like it". SLP reviewed imaging with Pt's daughter and suggested that we change her to NTL for now, with plan to advance her once she is clinically stronger. Daughter is willing to try this. Question whether Pt's appearance of elongated uvula could be due to some swelling from recent NG placement? Recommend D1/puree and NTL via cup/straw with pacing strategies due to shortness of breath and fatigue and ensure that Pt is sitting upright for all eating/drinking and remains up for at least 30 minutes after meals due to esophageal dsymotility. SLP will follow during acute stay.>>   Assessment / Plan / Recommendation  Clinical Impression  Pt known to SLP from previous admission and MBSS. Pt is alert, but appears fatigued. She was only at the SNF before returning to [facility]. Pt reportedly on puree or ground at [facility] and NTL. Pt assessed with thin water via cup/straw sips and NTL. Pt consumed ~40% puree lunch today. Appetite is poor. Pt without overt signs of reduced airway protection, however intake was limited. Ok to continue diet as ordered and recommend follow up at [facility] with repeat objective assessment when clinically improved vs upgrade to thins per treating SLP. Will send a copy of recent MBSS with Pt's daughter to give the treating SLP. Consider allowing [employer] protocol. SLP will sign off.   SLP Visit Diagnosis: Dysphagia, pharyngeal phase (R13.13);Dysphagia, pharyngoesophageal phase (R13.14)    Aspiration Risk  Mild aspiration risk;Risk for inadequate nutrition/hydration    Diet Recommendation Dysphagia 1 (Puree);Nectar-thick liquid   Liquid Administration via: Cup Medication Administration: Whole meds with puree Supervision: Staff to assist with self feeding;Full  supervision/cueing for compensatory strategies Compensations: Slow rate;Small sips/bites;Multiple dry swallows after each bite/sip Postural Changes: Seated upright at 90 degrees;Remain upright for at least 30 minutes after po intake    Other  Recommendations Oral Care Recommendations: Oral care BID;Staff/trained caregiver to provide oral care Other Recommendations: Clarify dietary restrictions;Order thickener from pharmacy;Prohibited food (jello, ice cream, thin soups)    Recommendations for follow up therapy are one component of a multi-disciplinary discharge planning process, led by the attending physician.  Recommendations may be updated based on patient status, additional functional criteria and insurance authorization.  Follow up Recommendations Skilled nursing-short term rehab (<3 hours/day)      Assistance Recommended at Discharge Frequent or constant Supervision/Assistance  Functional Status Assessment Patient has had a recent decline in their functional status and demonstrates the ability to make significant improvements in function in a reasonable and predictable amount of time.  Frequency and Duration min 2x/week  1 week       Prognosis Prognosis for Safe Diet Advancement: Good Barriers to Reach Goals: Severity of deficits      Swallow Study   General Date of Onset: 03/08/22 HPI: 88 year old female with history of diabetes mellitus type 2, COPD, hypertension, hyperlipidemia, GERD, and dysphagia presenting from [facility] secondary to altered level of consciousness and hypoxia.  The patient is unable to provide any significant history secondary to her extremis and altered mental status.  History is obtained from review of the medical record and speaking with the patient's daughter.  Notably, the patient was noted to have altered level consciousness when she was getting  a nebulizer treatment at [facility] on 03/08/2022.  It was thought that the patient may have had a syncopal  type episode when she was receiving a nebulizer treatment.  The patient had regained consciousness by the time EMS arrived, but she was noted to have oxygen saturation of 70%.  Chest x-ray earlier in the day showed right lower lobe opacity.  Notably, the patient was recently hospitalized from 02/21/2022 to 03/05/2022 for small bowel obstruction versus ileus.  Her hospitalization was also complicated by aspiration pneumonia.  In the ED, the patient had temperature up to 101.3 F.  She was hemodynamically stable.  She was placed on BiPAP with saturation 95-97%.  WBC 32.8, hemoglobin 9.0, platelets 261,000.  Sodium 133, potassium 3.4, bicarbonate 25, serum creatinine 1.03.  LFTs were unremarkable.  Troponin 120>> 114.  CTA chest was negative for PE but showed bilateral lower lobe consolidations with air bronchograms.  There are small bilateral pleural effusions.  There is a stable chronic T11 fracture.  The patient was initially started on cefepime.  This was changed to Zosyn.  The patient was placed on BiPAP and admitted to the stepdown unit. BSE requested. Pt had MBSS during recent admission. See results below Type of Study: Bedside Swallow Evaluation Previous Swallow Assessment: MBSS D1/puree and NTL Diet Prior to this Study: Dysphagia 1 (puree);Nectar-thick liquids Temperature Spikes Noted: No Respiratory Status: Nasal cannula History of Recent Intubation: No Behavior/Cognition: Alert;Cooperative;Pleasant mood Oral Cavity Assessment: Within Functional Limits Oral Care Completed by SLP: Yes Oral Cavity - Dentition: Dentures, top Vision: Functional for self-feeding Self-Feeding Abilities: Needs assist Patient Positioning: Upright in bed Baseline Vocal Quality: Low vocal intensity Volitional Cough: Weak Volitional Swallow: Able to elicit    Oral/Motor/Sensory Function Overall Oral Motor/Sensory Function: Within functional limits   Ice Chips Ice chips: Not tested   Thin Liquid Thin Liquid: Within  functional limits Presentation: Cup;Straw    Nectar Thick Nectar Thick Liquid: Within functional limits Presentation: Cup;Straw   Honey Thick Honey Thick Liquid: Not tested   Puree Puree: Within functional limits   Solid     Solid: Not tested     Thank you,  Genene Churn, [place]  Cynthea Zachman 03/13/2022,1:42 PM

## 2022-03-13 NOTE — Progress Notes (Signed)
Patient discharged back to [facility]. Daughter aware, has patient's hearing aids.

## 2022-03-13 NOTE — Progress Notes (Addendum)
03/13/22 0735  Test: potassium Critical Value: 2.7  Name of Provider Notified: Dr. Carles Collet  Orders Received? Or Actions Taken?: new order for PO potassium replacement

## 2022-03-14 DIAGNOSIS — J449 Chronic obstructive pulmonary disease, unspecified: Secondary | ICD-10-CM | POA: Diagnosis not present

## 2022-03-14 DIAGNOSIS — A419 Sepsis, unspecified organism: Secondary | ICD-10-CM | POA: Diagnosis not present

## 2022-03-14 DIAGNOSIS — E785 Hyperlipidemia, unspecified: Secondary | ICD-10-CM | POA: Diagnosis not present

## 2022-03-14 DIAGNOSIS — J9601 Acute respiratory failure with hypoxia: Secondary | ICD-10-CM | POA: Diagnosis not present

## 2022-03-14 DIAGNOSIS — J690 Pneumonitis due to inhalation of food and vomit: Secondary | ICD-10-CM | POA: Diagnosis not present

## 2022-03-14 DIAGNOSIS — I10 Essential (primary) hypertension: Secondary | ICD-10-CM | POA: Diagnosis not present

## 2022-03-14 DIAGNOSIS — R1310 Dysphagia, unspecified: Secondary | ICD-10-CM | POA: Diagnosis not present

## 2022-03-14 DIAGNOSIS — E119 Type 2 diabetes mellitus without complications: Secondary | ICD-10-CM | POA: Diagnosis not present

## 2022-03-16 DIAGNOSIS — Z7189 Other specified counseling: Secondary | ICD-10-CM | POA: Diagnosis not present

## 2022-03-16 DIAGNOSIS — A419 Sepsis, unspecified organism: Secondary | ICD-10-CM | POA: Diagnosis not present

## 2022-03-16 DIAGNOSIS — K56600 Partial intestinal obstruction, unspecified as to cause: Secondary | ICD-10-CM | POA: Diagnosis not present

## 2022-03-16 DIAGNOSIS — Z79899 Other long term (current) drug therapy: Secondary | ICD-10-CM | POA: Diagnosis not present

## 2022-03-16 DIAGNOSIS — J9601 Acute respiratory failure with hypoxia: Secondary | ICD-10-CM | POA: Diagnosis not present

## 2022-03-16 DIAGNOSIS — J189 Pneumonia, unspecified organism: Secondary | ICD-10-CM | POA: Diagnosis not present

## 2022-03-19 DIAGNOSIS — Z7189 Other specified counseling: Secondary | ICD-10-CM | POA: Diagnosis not present

## 2022-03-19 DIAGNOSIS — R52 Pain, unspecified: Secondary | ICD-10-CM | POA: Diagnosis not present

## 2022-03-19 DIAGNOSIS — F419 Anxiety disorder, unspecified: Secondary | ICD-10-CM | POA: Diagnosis not present

## 2022-03-20 DIAGNOSIS — R1310 Dysphagia, unspecified: Secondary | ICD-10-CM | POA: Diagnosis not present

## 2022-03-20 DIAGNOSIS — R52 Pain, unspecified: Secondary | ICD-10-CM | POA: Diagnosis not present

## 2022-03-20 DIAGNOSIS — R531 Weakness: Secondary | ICD-10-CM | POA: Diagnosis not present

## 2022-03-20 DIAGNOSIS — E1165 Type 2 diabetes mellitus with hyperglycemia: Secondary | ICD-10-CM | POA: Diagnosis not present

## 2022-03-20 DIAGNOSIS — E782 Mixed hyperlipidemia: Secondary | ICD-10-CM | POA: Diagnosis not present

## 2022-03-20 DIAGNOSIS — J449 Chronic obstructive pulmonary disease, unspecified: Secondary | ICD-10-CM | POA: Diagnosis not present

## 2022-03-20 DIAGNOSIS — A419 Sepsis, unspecified organism: Secondary | ICD-10-CM | POA: Diagnosis not present

## 2022-03-20 DIAGNOSIS — E46 Unspecified protein-calorie malnutrition: Secondary | ICD-10-CM | POA: Diagnosis not present

## 2022-03-20 DIAGNOSIS — I10 Essential (primary) hypertension: Secondary | ICD-10-CM | POA: Diagnosis not present

## 2022-03-20 DIAGNOSIS — K219 Gastro-esophageal reflux disease without esophagitis: Secondary | ICD-10-CM | POA: Diagnosis not present

## 2022-03-20 DIAGNOSIS — J9601 Acute respiratory failure with hypoxia: Secondary | ICD-10-CM | POA: Diagnosis not present

## 2022-03-21 DEATH — deceased

## 2022-05-07 ENCOUNTER — Ambulatory Visit: Payer: MEDICARE | Admitting: Podiatry
